# Patient Record
Sex: Female | Born: 1935
Health system: Southern US, Community
[De-identification: ages and names within clinical notes are randomized; demographics above are authoritative.]

## PROBLEM LIST (undated history)

## (undated) ENCOUNTER — Emergency Department (HOSPITAL_COMMUNITY): Admission: EM | Payer: Medicare Other | Source: Home / Self Care

## (undated) DIAGNOSIS — M9711XA Periprosthetic fracture around internal prosthetic right knee joint, initial encounter: Secondary | ICD-10-CM

## (undated) DIAGNOSIS — S82871C Displaced pilon fracture of right tibia, initial encounter for open fracture type IIIA, IIIB, or IIIC: Secondary | ICD-10-CM

## (undated) DIAGNOSIS — Z6841 Body Mass Index (BMI) 40.0 and over, adult: Secondary | ICD-10-CM

## (undated) DIAGNOSIS — S92309A Fracture of unspecified metatarsal bone(s), unspecified foot, initial encounter for closed fracture: Secondary | ICD-10-CM

## (undated) DIAGNOSIS — Z9289 Personal history of other medical treatment: Secondary | ICD-10-CM

## (undated) DIAGNOSIS — Z950 Presence of cardiac pacemaker: Secondary | ICD-10-CM

## (undated) DIAGNOSIS — M1990 Unspecified osteoarthritis, unspecified site: Secondary | ICD-10-CM

## (undated) DIAGNOSIS — Z8719 Personal history of other diseases of the digestive system: Secondary | ICD-10-CM

## (undated) DIAGNOSIS — I471 Supraventricular tachycardia: Secondary | ICD-10-CM

## (undated) DIAGNOSIS — M869 Osteomyelitis, unspecified: Secondary | ICD-10-CM

## (undated) DIAGNOSIS — L02211 Cutaneous abscess of abdominal wall: Secondary | ICD-10-CM

## (undated) DIAGNOSIS — I495 Sick sinus syndrome: Secondary | ICD-10-CM

## (undated) DIAGNOSIS — I10 Essential (primary) hypertension: Secondary | ICD-10-CM

## (undated) DIAGNOSIS — S82202B Unspecified fracture of shaft of left tibia, initial encounter for open fracture type I or II: Secondary | ICD-10-CM

## (undated) DIAGNOSIS — J45909 Unspecified asthma, uncomplicated: Secondary | ICD-10-CM

## (undated) DIAGNOSIS — M9712XA Periprosthetic fracture around internal prosthetic left knee joint, initial encounter: Secondary | ICD-10-CM

## (undated) DIAGNOSIS — R0602 Shortness of breath: Secondary | ICD-10-CM

## (undated) DIAGNOSIS — I2510 Atherosclerotic heart disease of native coronary artery without angina pectoris: Secondary | ICD-10-CM

## (undated) DIAGNOSIS — F0280 Dementia in other diseases classified elsewhere without behavioral disturbance: Secondary | ICD-10-CM

## (undated) DIAGNOSIS — N390 Urinary tract infection, site not specified: Secondary | ICD-10-CM

## (undated) DIAGNOSIS — K5790 Diverticulosis of intestine, part unspecified, without perforation or abscess without bleeding: Secondary | ICD-10-CM

## (undated) DIAGNOSIS — Z45018 Encounter for adjustment and management of other part of cardiac pacemaker: Secondary | ICD-10-CM

## (undated) HISTORY — PX: JOINT REPLACEMENT: SHX530

## (undated) HISTORY — DX: Essential (primary) hypertension: I10

## (undated) HISTORY — PX: INSERT / REPLACE / REMOVE PACEMAKER: SUR710

## (undated) HISTORY — PX: APPENDECTOMY: SHX54

## (undated) HISTORY — DX: Presence of cardiac pacemaker: Z95.0

## (undated) HISTORY — PX: TONSILLECTOMY: SUR1361

## (undated) HISTORY — DX: Sick sinus syndrome: I49.5

## (undated) HISTORY — DX: Dementia in other diseases classified elsewhere, unspecified severity, without behavioral disturbance, psychotic disturbance, mood disturbance, and anxiety: F02.80

## (undated) HISTORY — PX: BREAST LUMPECTOMY: SHX2

## (undated) HISTORY — DX: Diverticulosis of intestine, part unspecified, without perforation or abscess without bleeding: K57.90

## (undated) HISTORY — DX: Supraventricular tachycardia: I47.1

## (undated) HISTORY — DX: Atherosclerotic heart disease of native coronary artery without angina pectoris: I25.10

## (undated) HISTORY — PX: VENTRAL HERNIA REPAIR: SHX424

## (undated) HISTORY — PX: TOTAL ABDOMINAL HYSTERECTOMY: SHX209

## (undated) HISTORY — PX: HERNIA REPAIR: SHX51

## (undated) HISTORY — PX: REPLACEMENT TOTAL KNEE BILATERAL: SUR1225

---
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
## 2002-11-27 HISTORY — PX: CHOLECYSTECTOMY: SHX55

## 2004-06-27 HISTORY — PX: CORONARY ANGIOPLASTY WITH STENT PLACEMENT: SHX49

## 2009-05-27 ENCOUNTER — Inpatient Hospital Stay (HOSPITAL_COMMUNITY): Admission: EM | Admit: 2009-05-27 | Discharge: 2009-06-04 | Payer: Self-pay | Admitting: Emergency Medicine

## 2011-03-05 LAB — DIFFERENTIAL
Lymphocytes Relative: 17 % (ref 12–46)
Lymphs Abs: 1.7 10*3/uL (ref 0.7–4.0)
Monocytes Relative: 5 % (ref 3–12)
Neutro Abs: 7.6 10*3/uL (ref 1.7–7.7)
Neutrophils Relative %: 76 % (ref 43–77)

## 2011-03-05 LAB — CBC
HCT: 37.7 % (ref 36.0–46.0)
HCT: 39.1 % (ref 36.0–46.0)
Hemoglobin: 13.4 g/dL (ref 12.0–15.0)
MCHC: 34 g/dL (ref 30.0–36.0)
MCHC: 34.1 g/dL (ref 30.0–36.0)
MCV: 96.3 fL (ref 78.0–100.0)
Platelets: 239 10*3/uL (ref 150–400)
RDW: 15.1 % (ref 11.5–15.5)
RDW: 15.4 % (ref 11.5–15.5)

## 2011-03-05 LAB — LIPID PANEL
Cholesterol: 138 mg/dL (ref 0–200)
LDL Cholesterol: 73 mg/dL (ref 0–99)
Total CHOL/HDL Ratio: 2.8 RATIO

## 2011-03-05 LAB — BASIC METABOLIC PANEL
BUN: 9 mg/dL (ref 6–23)
CO2: 28 mEq/L (ref 19–32)
Calcium: 8.6 mg/dL (ref 8.4–10.5)
GFR calc non Af Amer: 54 mL/min — ABNORMAL LOW (ref >60)
Glucose, Bld: 123 mg/dL — ABNORMAL HIGH (ref 70–99)

## 2011-03-05 LAB — GLUCOSE, CAPILLARY
Glucose-Capillary: 100 mg/dL — ABNORMAL HIGH (ref 70–99)
Glucose-Capillary: 103 mg/dL — ABNORMAL HIGH (ref 70–99)
Glucose-Capillary: 106 mg/dL — ABNORMAL HIGH (ref 70–99)
Glucose-Capillary: 109 mg/dL — ABNORMAL HIGH (ref 70–99)
Glucose-Capillary: 110 mg/dL — ABNORMAL HIGH (ref 70–99)
Glucose-Capillary: 113 mg/dL — ABNORMAL HIGH (ref 70–99)
Glucose-Capillary: 128 mg/dL — ABNORMAL HIGH (ref 70–99)
Glucose-Capillary: 129 mg/dL — ABNORMAL HIGH (ref 70–99)
Glucose-Capillary: 69 mg/dL — ABNORMAL LOW (ref 70–99)
Glucose-Capillary: 77 mg/dL (ref 70–99)
Glucose-Capillary: 84 mg/dL (ref 70–99)
Glucose-Capillary: 86 mg/dL (ref 70–99)
Glucose-Capillary: 91 mg/dL (ref 70–99)
Glucose-Capillary: 94 mg/dL (ref 70–99)
Glucose-Capillary: 94 mg/dL (ref 70–99)
Glucose-Capillary: 96 mg/dL (ref 70–99)
Glucose-Capillary: 96 mg/dL (ref 70–99)

## 2011-03-05 LAB — URINALYSIS, ROUTINE W REFLEX MICROSCOPIC
Bilirubin Urine: NEGATIVE
Ketones, ur: NEGATIVE mg/dL
Nitrite: NEGATIVE
pH: 8 (ref 5.0–8.0)

## 2011-03-05 LAB — PROTIME-INR
INR: 1 (ref 0.00–1.49)
Prothrombin Time: 12.8 seconds (ref 11.6–15.2)

## 2011-03-05 LAB — HEPATIC FUNCTION PANEL
AST: 27 U/L (ref 0–37)
Bilirubin, Direct: 0.1 mg/dL (ref 0.0–0.3)

## 2011-03-05 LAB — TSH: TSH: 1.974 u[IU]/mL (ref 0.350–4.500)

## 2011-03-05 LAB — COMPREHENSIVE METABOLIC PANEL
BUN: 15 mg/dL (ref 6–23)
Calcium: 9.5 mg/dL (ref 8.4–10.5)
Creatinine, Ser: 1 mg/dL (ref 0.4–1.2)
Glucose, Bld: 122 mg/dL — ABNORMAL HIGH (ref 70–99)
Sodium: 138 mEq/L (ref 135–145)
Total Protein: 7.6 g/dL (ref 6.0–8.3)

## 2011-03-05 LAB — APTT: aPTT: 30 seconds (ref 24–37)

## 2011-03-21 ENCOUNTER — Other Ambulatory Visit: Payer: Self-pay | Admitting: Oncology

## 2011-03-21 ENCOUNTER — Encounter (HOSPITAL_BASED_OUTPATIENT_CLINIC_OR_DEPARTMENT_OTHER): Payer: Medicare Other | Admitting: Oncology

## 2011-03-21 DIAGNOSIS — D0590 Unspecified type of carcinoma in situ of unspecified breast: Secondary | ICD-10-CM

## 2011-03-21 DIAGNOSIS — I10 Essential (primary) hypertension: Secondary | ICD-10-CM

## 2011-03-21 DIAGNOSIS — K219 Gastro-esophageal reflux disease without esophagitis: Secondary | ICD-10-CM

## 2011-03-21 DIAGNOSIS — I2510 Atherosclerotic heart disease of native coronary artery without angina pectoris: Secondary | ICD-10-CM

## 2011-03-21 LAB — BASIC METABOLIC PANEL
CO2: 23 mEq/L (ref 19–32)
Calcium: 9.3 mg/dL (ref 8.4–10.5)
Chloride: 103 mEq/L (ref 96–112)
Glucose, Bld: 91 mg/dL (ref 70–99)
Potassium: 4.1 mEq/L (ref 3.5–5.3)
Sodium: 141 mEq/L (ref 135–145)

## 2011-03-21 LAB — CBC WITH DIFFERENTIAL/PLATELET
BASO%: 0.4 % (ref 0.0–2.0)
Basophils Absolute: 0 10*3/uL (ref 0.0–0.1)
HCT: 37.8 % (ref 34.8–46.6)
HGB: 12.6 g/dL (ref 11.6–15.9)
LYMPH%: 29.8 % (ref 14.0–49.7)
MCH: 32.1 pg (ref 25.1–34.0)
MCHC: 33.3 g/dL (ref 31.5–36.0)
MCV: 96.3 fL (ref 79.5–101.0)
NEUT%: 62.2 % (ref 38.4–76.8)
RDW: 16.1 % — ABNORMAL HIGH (ref 11.2–14.5)

## 2011-03-22 ENCOUNTER — Ambulatory Visit: Payer: Medicare Other | Attending: Radiation Oncology | Admitting: Radiation Oncology

## 2011-03-22 DIAGNOSIS — Z7902 Long term (current) use of antithrombotics/antiplatelets: Secondary | ICD-10-CM | POA: Insufficient documentation

## 2011-03-22 DIAGNOSIS — E785 Hyperlipidemia, unspecified: Secondary | ICD-10-CM | POA: Insufficient documentation

## 2011-03-22 DIAGNOSIS — I10 Essential (primary) hypertension: Secondary | ICD-10-CM | POA: Insufficient documentation

## 2011-03-22 DIAGNOSIS — I4891 Unspecified atrial fibrillation: Secondary | ICD-10-CM | POA: Insufficient documentation

## 2011-03-22 DIAGNOSIS — I2510 Atherosclerotic heart disease of native coronary artery without angina pectoris: Secondary | ICD-10-CM | POA: Insufficient documentation

## 2011-03-22 DIAGNOSIS — Z950 Presence of cardiac pacemaker: Secondary | ICD-10-CM | POA: Insufficient documentation

## 2011-03-22 DIAGNOSIS — D0590 Unspecified type of carcinoma in situ of unspecified breast: Secondary | ICD-10-CM | POA: Insufficient documentation

## 2011-03-22 DIAGNOSIS — Z7982 Long term (current) use of aspirin: Secondary | ICD-10-CM | POA: Insufficient documentation

## 2011-03-22 DIAGNOSIS — Z79899 Other long term (current) drug therapy: Secondary | ICD-10-CM | POA: Insufficient documentation

## 2011-04-11 NOTE — H&P (Signed)
NAME:  Jill Bowman, Jill Bowman NO.:  [id number]   MEDICAL RECORD NO.:  [id number]          PATIENT TYPE:  EMS   LOCATION:  [facility]                         FACILITY:  [facility]   PHYSICIAN:  Eduard Clos, MDDATE OF BIRTH:  Apr 20, 1929   DATE OF ADMISSION:  05/27/2009  DATE OF DISCHARGE:                              HISTORY & PHYSICAL   PRIMARY CARE PHYSICIAN:  Meredith Staggers at [place] [place].   PRIMARY SURGEON:  Dr. Gwynneth Munson in [place].   CHIEF COMPLAINT:  Abdominal pain.   PRESENT ILLNESS:  A 77 year old female with known history of CAD status  post stenting, hypertension, recent bowel rupture secondary to hernia  and in March 2010 had surgery who presented with complaints of abdominal  pain since last night. The patient was doing  fairly well until last  night when she had moved from [place] to live with her  daughter in  April.  She has frequent, constant touch with her primary care doctor's  in [place] and gets her medications refilled.   Last night the patient started developing  some abdominal pain in  midabdomen  severe and she came to the ER.  The patient has been having  some nausea but not had any frank vomiting.  She did not have any  diarrhea.  The patient has had some constipation and had called a doctor  in the ER who had given her  MiraLax despite which she did not have a  good bowel movement.  Denies any diarrhea, fever, chills.  Abdominal  pain is constant relieved by morphine given at the ER.  The pain is  dull, aching, nonradiating, midabdomen.   The patient denies any chest pain, shortness of breath, palpitation,  dizziness, loss of consciousness, weakness of limbs, dysuria or  discharges.   PAST MEDICAL HISTORY:  1. Hypertension.  2. Hyperglycemia.  3. CAD status post stenting.  4. Arrhythmia.  5. Pacemaker placement.   PAST SURGICAL HISTORY:  1. Hernia repair.  2. Bilateral knee replacement.  3. Stent placement.   MEDICATIONS PRIOR TO ADMISSION:  1. Metoprolol 25 mg p.o. t.i.d.  2. Claritin 10 mg p.o. daily.  3. HCTZ 25 mg p.o. daily.  4. Pravastatin 4 mg p.o. daily.  5. MiraLax  p.r.n.  6. Vitamin D 2000 units p.o. daily.  7. B6 50 mg p.o. daily.  8. Tylenol arthritis p.r.n.  9. Aspirin 81 mg p.o. daily.  10.Amiodarone 200 mg p.o. daily.  11.Plavix 75 mg daily.   ALLERGIES:  PENICILLIN, ZOCOR AND CODEINE.   FAMILY HISTORY:  Noncontributory.   SOCIAL HISTORY:  The patient lives with her daughter.  Denies smoking  cigarettes, drinking alcohol or use of illegal drugs.   REVIEW OF SYSTEMS:  At present, as in history of present illness.  Nothing else significant.   PHYSICAL EXAMINATION:  The patient is examined at bedside, not in acute  distress.  VITAL SIGNS:  Blood pressure 105/60, pulse 60 per minute, temperature  97.9, respirations 18 per minute, O2 saturation 100% HEENT:  Anicteric.  No pallor.  CHEST:  Bilateral  air entry  present.  No rhonchi, no crepitation.  HEART:  S1 and S2 heard.  ABDOMEN:  Soft, nontender.  Bowel sounds heard.  There are scars from  previous surgery, none of them having any active discharge.  No guarding  or rigidity.  NEUROLOGIC:  Alert, awake, oriented to time, place and person.  Moves  upper and lower extremities.  EXTREMITIES:  Peripheral pulses felt.  No edema.   Labs:  CBC = WBCs 10, hemoglobin 13.4, hematocrit 39.1, platelets 240,  neutrophils 76%, PT/INR 2.8 and 1.  Complete metabolic panel - sodium  138, potassium 3.9, chloride 100, carbon dioxide 26, glucose 122, BUN  15, creatinine 1, total bilirubin 0.8, AST 24, ALT 13, alkaline phos 89,  albumin 3.9, calcium 9.5.  Lactic acid is 3.5.  UA nitrites and  leukocytes are negative.  CT abdomen and pelvis shows right lower  quadrant abdominal hernia through which small bowel traverses;  this  does not appear to be point of obstruction, has small bowel loops of  fluid-filled and prominent  proximal  and distal to this region.  The  exact cause of  small bowel dilatation is indeterminate.  Mesenteric  adenopathy indeterminate, question infectious versus inflammatory with  malignant.  Degenerative lumbar spine, atherosclerotic changes in aorta  pelvis shows prominent sigmoid diverticulosis without diverticulitis.   ASSESSMENT:  1. Abdominal pain with possible small-bowel obstruction.  2. Recent hernia rupture of the abdomen and surgery.  3. Dehydration.  4. Hypertension.  5. Coronary artery disease.  6. Hyperlipidemia.   PLAN:  1. Will admit the patient to telemetry as the patient has significant      cardiac issues.  Will hydrate the patient with normal saline and      place the patient on clear liquid diet with CBG checks.  2. Place on Cipro and Flagyl.  3. Will place the patient on pain relief medication and morphine as      morphine is giving considerable relief  for the      patient.  4. Will place the patient on resumed  home medications including      Plavix and aspirin.  5. I have requested a surgery consult.  Will follow their      recommendations.      Eduard Clos, MD  Electronically Signed     ANK/MEDQ  D:  05/27/2009  T:  05/27/2009  Job:  [id number]

## 2011-04-11 NOTE — Discharge Summary (Signed)
NAMELINNELL, SWORDS            ACCOUNT NO.:  [id number]   MEDICAL RECORD NO.:  [id number]          PATIENT TYPE:  INP   LOCATION:  5016                         FACILITY:  [facility]   PHYSICIAN:  Michelene Gardener, MD    DATE OF BIRTH:  [birth year]   DATE OF ADMISSION:  05/27/2009  DATE OF DISCHARGE:  06/04/2009                               DISCHARGE SUMMARY   FINAL DISCHARGE SUMMARY:  1. Partial small-bowel obstruction, which totally resolved at the time      of discharge and the patient tolerated diet well.  2. Hypertension.  3. Lower extremities edema.  4. Coronary artery disease.  5. Status post permanent pacemaker placement.  6. Dyslipidemia.  7. Minor left lower extremity cellulitis that totally resolved.   DISCHARGE MEDICATIONS:  1. Lasix 20 mg p.o. once daily for 1 week.  2. Colace 100 mg p.o. twice daily as needed.  The patient advised not      to take it unless she is constipated.  3. Imodium 2 mg after each loose bowel movement, maximum 8 mg per day.      The patient advised not to take it unless she had more than three      bowel movements a day.  4. Metoprolol 25 mg three times daily.  5. Claritin 10 mg once a day.  6. Pravastatin 40 mg once a day.  7. MiraLax as needed.  8. Vitamin D 2000 units once a day.  9. Vitamin B6 50 mg once a day.  10.Aspirin 81 mg once a day.  11.Amiodarone 200 mg once a day.  12.Plavix 75 mg once a day.  13.Ranitidine 300 mg p.o. once a day.   CONSULTATION:  Surgical consult.   PROCEDURES:  None.   DIAGNOSTIC STUDIES:  For details about diagnostic studies, please see  previously dictated progress note in May 31, 2009.   FOLLOW UP:  1. Primary doctor in [place], Dr. Willy Eddy within 2 weeks.  2. Primary surgeon in [place], Dr. Gwynneth Munson.  The patient already      had an appointment in August.  3. If the patient remains at [facility], then she was advised to      contact [facility] and number was given to  the      patient and family.   COURSE OF HOSPITALIZATION:  For details about this course of  hospitalization, please refer to previously dictated progress note done  by Dr. Ruthy Dick on May 31, 2009.  Since that time, the patient  has been doing very well.  No further diagnostic studies were done.  When I saw this patient on June 02, 2009, she was on clear liquid diet  and she was tolerating very well.  Her diet was advanced and she did  very well.  She developed minor redness and tenderness in her left lower  extremity for which she was started on clindamycin and that totally  resolved.  The patient also developed lower extremity edema, which is  apparently from the IV fluid that she has been taking.  This normally  happens from time  to time and the patient is to take  hydrochlorothiazide.  I advised her and her daughter to discontinue the  hydrochlorothiazide for now and to restart Lasix small dose at 20 mg  once a day, to be taken for a week and then to be reevaluated by her  primary doctor to see if she needs more medicine.   Today at the time of discharge, the patient feels very well, does not  have abdominal pain, does not have nausea, does not have vomiting.  Vital signs are stable.  She would be discharged home today in the above-  mentioned medications.   Total assessment time is 40 minutes.      Michelene Gardener, MD  Electronically Signed     NAE/MEDQ  D:  06/04/2009  T:  06/05/2009  Job:  [id number]

## 2011-04-11 NOTE — Group Therapy Note (Signed)
NAMEANGELISSA, Bowman NO.:  [id number]   MEDICAL RECORD NO.:  [id number]          PATIENT TYPE:  INP   LOCATION:  3702                         [facility]   PHYSICIAN:  Ruthy Dick, MD    DATE OF BIRTH:  07/06/1929                                 PROGRESS NOTE   PROBLEM LIST:  1. Partial small-bowel obstruction resolving slowly at this time. The      patient CT enterography today for further evaluation.  Last      abdominal x-ray done yesterday showed a resolving small bowel      obstruction.  The patient today has a small abdominal discomfort      but the nausea has resolved.  She is being managed by Surgery as      well.  2. Hypertension, controlled.  3. Coronary coronary disease status post stenting and stable.  4. Arrhythmia.  5. Permanent pacemaker placement, stable.  6. Itching.  The patient is on of Benadryl.  7. Dyslipidemia.  The patient is on pravastatin.   Consult during this admission :  surgical consult.   PROCEDURES DONE DURING THIS ADMISSION:  1. CT scan of the abdomen and pelvis which was read as right lower      quadrant anterior abdominal wall hernia through which small bowel      transverses. Exact cause of small bowel dilatation is      indeterminate.  2. CT scan of the pelvis showed prominent sigmoid diverticulosis      without CT evidence of diverticulitis.  3. X-ray of the abdomen done on May 27, 2009 showed air fluid levels      and several mildly distended small bowel loops suggesting adynamic      ileus versus early partial small-bowel obstruction.  No free air      was noted.  4. An abdominal x-ray done yesterday, May 30, 2009, showed a resolving      partial small-bowel obstruction.   BRIEF HISTORY OF PRESENT ILLNESS AND HOSPITAL COURSE:  This is 77 year old lady with a known history of CAD status post  stenting, hypertension and recent bowel rupture secondary to hernia in  March 2010 and had surgery at that time.  She  presented with complaints  of abdominal pain since the night prior to presentation. As noted above  x-ray pointed to a possible partial small bowel obstruction and the  patient has been kept n.p.o., managed by surgery in the hope of avoiding  surgical procedure.  At this point she seems to be resolving some and if  she is able to resolve conservatively that would be the best option for  her at this time.  She has been placed prophylactically on Cipro and  Flagyl at this point.   DISPOSITION:  Will be according to the surgical team.  Today the patient says she has  no nausea.  She had three episodes of flatus last night but no bowel  movement yet.  She continues to have some abdominal discomfort but  improved from what she had on presentation. No chest pain, no shortness  of breath and  no nausea and vomiting.  No dysuria, no frequency, no  urgency, no syncope. Vitals today are temperature 98.1, pulse 61,  respiration 18, blood pressure 94/47, saturating 98% on 2 liters nasal  cannula.  CHEST:  Clear to auscultation bilaterally.  ABDOMEN:  Soft  with only very mild tenderness on very deep palpation.  No rebound or  guarding.  Bowel sounds present.  They are hyperactive.  EXTREMITIES:  No clubbing, cyanosis, or edema.  CARDIOVASCULAR:  First and second  heart sounds only. CENTRAL NERVOUS SYSTEM:  Nonfocal.   DISCHARGE MEDICATIONS:  This will be dictated at the time of final discharge.      Ruthy Dick, MD  Electronically Signed     GU/MEDQ  D:  05/31/2009  T:  05/31/2009  Job:  [id number]

## 2011-05-05 ENCOUNTER — Encounter: Payer: Self-pay | Admitting: Internal Medicine

## 2011-05-05 ENCOUNTER — Emergency Department (HOSPITAL_COMMUNITY)
Admission: EM | Admit: 2011-05-05 | Discharge: 2011-05-05 | Disposition: A | Discharge status: Home / Self Care | Payer: Medicare Other | Attending: Emergency Medicine | Admitting: Emergency Medicine

## 2011-05-05 DIAGNOSIS — E7800 Pure hypercholesterolemia, unspecified: Secondary | ICD-10-CM | POA: Insufficient documentation

## 2011-05-05 DIAGNOSIS — J45909 Unspecified asthma, uncomplicated: Secondary | ICD-10-CM | POA: Insufficient documentation

## 2011-05-05 DIAGNOSIS — I10 Essential (primary) hypertension: Secondary | ICD-10-CM | POA: Insufficient documentation

## 2011-05-05 DIAGNOSIS — T82897A Other specified complication of cardiac prosthetic devices, implants and grafts, initial encounter: Secondary | ICD-10-CM | POA: Insufficient documentation

## 2011-05-05 DIAGNOSIS — E119 Type 2 diabetes mellitus without complications: Secondary | ICD-10-CM | POA: Insufficient documentation

## 2011-05-05 DIAGNOSIS — I498 Other specified cardiac arrhythmias: Secondary | ICD-10-CM | POA: Insufficient documentation

## 2011-05-05 DIAGNOSIS — Y831 Surgical operation with implant of artificial internal device as the cause of abnormal reaction of the patient, or of later complication, without mention of misadventure at the time of the procedure: Secondary | ICD-10-CM | POA: Insufficient documentation

## 2011-06-05 ENCOUNTER — Encounter: Payer: Self-pay | Admitting: Internal Medicine

## 2011-06-06 ENCOUNTER — Ambulatory Visit (INDEPENDENT_AMBULATORY_CARE_PROVIDER_SITE_OTHER): Payer: Medicare Other | Admitting: Internal Medicine

## 2011-06-06 ENCOUNTER — Encounter: Payer: Self-pay | Admitting: Internal Medicine

## 2011-06-06 ENCOUNTER — Encounter: Payer: Self-pay | Admitting: *Deleted

## 2011-06-06 DIAGNOSIS — I498 Other specified cardiac arrhythmias: Secondary | ICD-10-CM

## 2011-06-06 DIAGNOSIS — I2510 Atherosclerotic heart disease of native coronary artery without angina pectoris: Secondary | ICD-10-CM

## 2011-06-06 DIAGNOSIS — I495 Sick sinus syndrome: Secondary | ICD-10-CM | POA: Insufficient documentation

## 2011-06-06 DIAGNOSIS — Z950 Presence of cardiac pacemaker: Secondary | ICD-10-CM

## 2011-06-06 LAB — PACEMAKER DEVICE OBSERVATION
BATTERY VOLTAGE: 2.57 V
BMOD-0003RV: 30
BMOD-0005RV: 95 {beats}/min
RV LEAD AMPLITUDE: 8 mv
RV LEAD IMPEDENCE PM: 484 Ohm
RV LEAD THRESHOLD: 1.25 V
VENTRICULAR PACING PM: 8

## 2011-06-06 NOTE — Assessment & Plan Note (Signed)
The patient's device was interrogated.  The information was reviewed. No changes were made in the programming.     The device is reverted to VVI as she is at [facility]. We discussed risks benefits and alternatives of the device generator replacement. She is agreeable to proceed

## 2011-06-06 NOTE — Assessment & Plan Note (Signed)
Stable on curretn meds

## 2011-06-06 NOTE — Assessment & Plan Note (Signed)
The patient's device is programmed in atrium 35% of the time prior to reversion. It was not ventricularly paced at all. Presently she is not having significant symptoms with ventricular pacing. She has not had significant atrial fibrillation and Dr. Ottis Stain has decreased her amiodarone from 200-100 mg a day. It was is considering these have all anticoagulation as oppsed to  aspirin Plavix combination which she is currently taking

## 2011-06-06 NOTE — Patient Instructions (Signed)
Your physician has recommended that you have a pacemaker generator change. Please see the instruction sheet given to you today for more information.    

## 2011-06-06 NOTE — Progress Notes (Signed)
HPI: Jill Bowman is a 77 y.o. female Seen at the request of Dr. Ottis Stain for pacemaker generator replacement.  She has been staying here with her daughter following breast cancer treatment. She underwent pacemaker implantation [place] in 2005 for tachybradycardia syndrome. Her device has reached ERI. It appears that initial voltages were never changed.  His history of atrial fibrillation for which she takes amiodarone. She currently takes aspirin and Plavix but is not on warfarin.  Her history of coronary disease is notable for stenting August 05 in her left coronary.. Echo 2011 was normal and also negative nuclear test in March of 2011.  She has problems with bruisability; she has no chest pain. She has mild exercise intolerance and peripheral edema.   There is some discomfort/itching at her pacemaker site Current Outpatient Prescriptions  Medication Sig Dispense Refill  . acetaminophen (TYLENOL) 325 MG tablet Take 650 mg by mouth every 6 (six) hours as needed.        Marland Kitchen amiodarone (PACERONE) 200 MG tablet Take 100 mg by mouth daily.        Marland Kitchen aspirin 81 MG tablet Take 162 mg by mouth daily.        . Cholecalciferol (VITAMIN D) 1000 UNITS capsule Take 2,000 Units by mouth daily.        . clopidogrel (PLAVIX) 75 MG tablet Take 75 mg by mouth daily.        . hydrochlorothiazide 25 MG tablet Take 25 mg by mouth daily.        Marland Kitchen loratadine (CLARITIN) 10 MG tablet Take 10 mg by mouth daily.        . metoprolol succinate (TOPROL-XL) 25 MG 24 hr tablet Take 25 mg by mouth daily.        . potassium chloride (KLOR-CON) 10 MEQ CR tablet Take 10 mEq by mouth daily.        . pravastatin (PRAVACHOL) 80 MG tablet Take 80 mg by mouth daily.        . ranitidine (ZANTAC) 300 MG capsule Take 300 mg by mouth every evening.          Allergies  Allergen Reactions  . Ciprofloxacin   . Penicillins   . Sulfa Antibiotics     Past Medical History  Diagnosis Date  . Hypertension   . Coronary artery disease    . Diabetes mellitus   . Chest pain   . Diverticulosis   . Asthma   . Hypercholesterolemia   . Arrhythmia     Past Surgical History  Procedure Date  . Insert / replace / remove pacemaker   . Hernia repair     No family history on file. Family history is notable for heart problems and black lung  History   Social History  . Marital Status: Single    Spouse Name: N/A    Number of Children: N/A  . Years of Education: N/A   Occupational History  . Not on file.   Social History Main Topics  . Smoking status: Never Smoker   . Smokeless tobacco: Not on file  . Alcohol Use: No  . Drug Use: No  . Sexually Active: Not on file   Other Topics Concern  . Not on file   Social History Narrative  . No narrative on file  she is widowed she has 5 children she does not use alcohol or recreational drugs  Fourteen point review of systems was negative except as noted in HPI and  pmh except  arthritis and allergies  PHYSICAL EXAMINATION  Blood pressure 157/80, pulse 65, height 5' 7.5" (1.715 m), weight 204 lb (92.534 kg).   Well developed and nourished elderly obese African American female her stated agein no acute distress HENT normal Neck supple with JVP-flat Carotids brisk and full without bruits Back without scoliosis or kyphosis Clear Pacemaker pocket is well-healed with some downward migration of the device Regular rate and rhythm, no murmurs or gallops Abd-soft with active BS without hepatomegaly or midline pulsation Femoral pulses 2+ distal pulses intact No Clubbing cyanosis ;trace edema Skin-warm and dry LN-neg submandibular and supraclavicular A & Oriented CN 3-12 normal  Grossly normal sensory and motor function Affect engaging   ECG demonstrates ventricular pacing at 65 without evidence of atrial activity   .

## 2011-06-19 ENCOUNTER — Other Ambulatory Visit (INDEPENDENT_AMBULATORY_CARE_PROVIDER_SITE_OTHER): Payer: Medicare Other | Admitting: *Deleted

## 2011-06-19 DIAGNOSIS — I2510 Atherosclerotic heart disease of native coronary artery without angina pectoris: Secondary | ICD-10-CM

## 2011-06-19 DIAGNOSIS — I498 Other specified cardiac arrhythmias: Secondary | ICD-10-CM

## 2011-06-19 DIAGNOSIS — I495 Sick sinus syndrome: Secondary | ICD-10-CM

## 2011-06-19 DIAGNOSIS — Z950 Presence of cardiac pacemaker: Secondary | ICD-10-CM

## 2011-06-19 LAB — CBC WITH DIFFERENTIAL/PLATELET
Basophils Relative: 0.4 % (ref 0.0–3.0)
Eosinophils Relative: 2 % (ref 0.0–5.0)
HCT: 36.2 % (ref 36.0–46.0)
Lymphs Abs: 1.7 10*3/uL (ref 0.7–4.0)
Monocytes Relative: 5.8 % (ref 3.0–12.0)
Platelets: 294 10*3/uL (ref 150.0–400.0)
RBC: 3.77 Mil/uL — ABNORMAL LOW (ref 3.87–5.11)
WBC: 7.4 10*3/uL (ref 4.5–10.5)

## 2011-06-19 LAB — BASIC METABOLIC PANEL
BUN: 26 mg/dL — ABNORMAL HIGH (ref 6–23)
Chloride: 101 mEq/L (ref 96–112)
GFR: 47.3 mL/min — ABNORMAL LOW (ref >60.00)
Potassium: 4.8 mEq/L (ref 3.5–5.1)
Sodium: 136 mEq/L (ref 135–145)

## 2011-06-19 LAB — PROTIME-INR
INR: 1 ratio (ref 0.8–1.0)
Prothrombin Time: 11.1 s (ref 10.2–12.4)

## 2011-06-23 ENCOUNTER — Ambulatory Visit (HOSPITAL_COMMUNITY)
Admission: RE | Admit: 2011-06-23 | Discharge: 2011-06-23 | Disposition: A | Discharge status: Home / Self Care | Payer: Medicare Other | Source: Ambulatory Visit | Attending: Internal Medicine | Admitting: Internal Medicine

## 2011-06-23 DIAGNOSIS — Z7982 Long term (current) use of aspirin: Secondary | ICD-10-CM | POA: Insufficient documentation

## 2011-06-23 DIAGNOSIS — Z45018 Encounter for adjustment and management of other part of cardiac pacemaker: Secondary | ICD-10-CM | POA: Insufficient documentation

## 2011-06-23 DIAGNOSIS — Z79899 Other long term (current) drug therapy: Secondary | ICD-10-CM | POA: Insufficient documentation

## 2011-06-23 DIAGNOSIS — I498 Other specified cardiac arrhythmias: Secondary | ICD-10-CM

## 2011-06-23 DIAGNOSIS — E119 Type 2 diabetes mellitus without complications: Secondary | ICD-10-CM | POA: Insufficient documentation

## 2011-06-23 DIAGNOSIS — I10 Essential (primary) hypertension: Secondary | ICD-10-CM | POA: Insufficient documentation

## 2011-06-23 DIAGNOSIS — I2510 Atherosclerotic heart disease of native coronary artery without angina pectoris: Secondary | ICD-10-CM | POA: Insufficient documentation

## 2011-06-23 DIAGNOSIS — J45909 Unspecified asthma, uncomplicated: Secondary | ICD-10-CM | POA: Insufficient documentation

## 2011-06-23 DIAGNOSIS — Z853 Personal history of malignant neoplasm of breast: Secondary | ICD-10-CM | POA: Insufficient documentation

## 2011-06-23 DIAGNOSIS — E7800 Pure hypercholesterolemia, unspecified: Secondary | ICD-10-CM | POA: Insufficient documentation

## 2011-06-23 DIAGNOSIS — K5730 Diverticulosis of large intestine without perforation or abscess without bleeding: Secondary | ICD-10-CM | POA: Insufficient documentation

## 2011-06-23 LAB — SURGICAL PCR SCREEN: Staphylococcus aureus: POSITIVE — AB

## 2011-07-06 ENCOUNTER — Ambulatory Visit (INDEPENDENT_AMBULATORY_CARE_PROVIDER_SITE_OTHER): Payer: Medicare Other | Admitting: *Deleted

## 2011-07-06 DIAGNOSIS — I495 Sick sinus syndrome: Secondary | ICD-10-CM

## 2011-07-24 NOTE — Op Note (Signed)
  NAMEMarland Kitchen  Jill Bowman, Jill Bowman NO.:  [id number]  MEDICAL RECORD NO.:  [id number]  LOCATION:  [facility]                         FACILITY:  [facility]  PHYSICIAN:  Duke Salvia, MD, FACCDATE OF BIRTH:  [birth year]  DATE OF PROCEDURE:  06/23/2011 DATE OF DISCHARGE:  06/23/2011                              OPERATIVE REPORT   PREOPERATIVE DIAGNOSES:  Sinus node dysfunction, previously implanted pacemaker now at elective replacement indicator.  POSTOPERATIVE DIAGNOSES:  Sinus node dysfunction, previously implanted pacemaker now at elective replacement indicator.  PROCEDURE:  Explantation of a previously implanted device, pocket revision, and insertion of a new device.  Following obtaining informed consent, the patient was brought to the Electrophysiology Laboratory and placed on the fluoroscopic table in supine position.  After routine prep and drape of the left upper chest, lidocaine was infiltrated about 0.5 inch caudal to the previous incision as the pacemaker had "fallen down" about 2-3 inches.  Incision was made and carried down to layer of the prepectoral fascia using sharp dissection and then the pocket was dissected caudally until the pacemaker was exposed.  The pacemaker was freed up and explanted from the previously explanted and removed from the previous leads.  The leads were then dissected over about 8 inches of their length because they were round and around each other and formed hairpin turns on themselves. The pocket was then copiously irrigated with antibiotic-containing saline solution.  The leads and pulse generator were attached to a Medtronic Adapta R01 pulse generator, serial number [id number] H ventricular pacing and atrial pacing were identified.  The leads and pulse generator were placed in the pocket and secured to the prepectoral fascia.  Surgicel was placed on the posterior aspect of the pocket where the prepectoral fascia had been removed and the muscle  exposed and the wound was then closed in 3 layers in a normal fashion.  The wound was washed and dried and a benzoin and Steri-Strip dressing was applied. Needle counts, sponge counts, and instrument counts were correct at the end of the procedure according to the staff.  The patient tolerated the procedure without apparent complication.     Duke Salvia, MD, [facility]     SCK/MEDQ  D:  06/23/2011  T:  06/24/2011  Job:  [id number]  Electronically Signed by Sherryl Manges MD [facility] on 07/24/2011 01:50:47 PM

## 2011-10-02 ENCOUNTER — Encounter: Payer: PRIVATE HEALTH INSURANCE | Admitting: Internal Medicine

## 2012-02-12 ENCOUNTER — Encounter: Payer: Self-pay | Admitting: Internal Medicine

## 2012-07-03 ENCOUNTER — Inpatient Hospital Stay (HOSPITAL_COMMUNITY): Payer: Medicare Other

## 2012-07-03 ENCOUNTER — Emergency Department (HOSPITAL_COMMUNITY): Payer: Medicare Other

## 2012-07-03 ENCOUNTER — Encounter (HOSPITAL_COMMUNITY): Admission: EM | Disposition: A | Discharge status: Skilled Nursing Facility | Payer: Self-pay | Source: Home / Self Care

## 2012-07-03 ENCOUNTER — Encounter (HOSPITAL_COMMUNITY): Payer: Self-pay | Admitting: Certified Registered Nurse Anesthetist

## 2012-07-03 ENCOUNTER — Encounter (HOSPITAL_COMMUNITY): Payer: Self-pay | Admitting: General Surgery

## 2012-07-03 ENCOUNTER — Inpatient Hospital Stay (HOSPITAL_COMMUNITY)
Admission: EM | Admit: 2012-07-03 | Discharge: 2012-07-18 | DRG: 956 | Disposition: A | Discharge status: Skilled Nursing Facility | Payer: Medicare Other | Attending: General Surgery | Admitting: General Surgery

## 2012-07-03 ENCOUNTER — Inpatient Hospital Stay (HOSPITAL_COMMUNITY): Payer: Medicare Other | Admitting: Certified Registered Nurse Anesthetist

## 2012-07-03 DIAGNOSIS — S81809A Unspecified open wound, unspecified lower leg, initial encounter: Secondary | ICD-10-CM | POA: Diagnosis present

## 2012-07-03 DIAGNOSIS — M5380 Other specified dorsopathies, site unspecified: Secondary | ICD-10-CM | POA: Diagnosis not present

## 2012-07-03 DIAGNOSIS — D696 Thrombocytopenia, unspecified: Secondary | ICD-10-CM | POA: Diagnosis present

## 2012-07-03 DIAGNOSIS — S82871C Displaced pilon fracture of right tibia, initial encounter for open fracture type IIIA, IIIB, or IIIC: Secondary | ICD-10-CM | POA: Diagnosis present

## 2012-07-03 DIAGNOSIS — J9601 Acute respiratory failure with hypoxia: Secondary | ICD-10-CM | POA: Diagnosis present

## 2012-07-03 DIAGNOSIS — E46 Unspecified protein-calorie malnutrition: Secondary | ICD-10-CM | POA: Diagnosis present

## 2012-07-03 DIAGNOSIS — R0902 Hypoxemia: Secondary | ICD-10-CM | POA: Diagnosis present

## 2012-07-03 DIAGNOSIS — E876 Hypokalemia: Secondary | ICD-10-CM | POA: Diagnosis not present

## 2012-07-03 DIAGNOSIS — S81009A Unspecified open wound, unspecified knee, initial encounter: Secondary | ICD-10-CM | POA: Diagnosis present

## 2012-07-03 DIAGNOSIS — I2510 Atherosclerotic heart disease of native coronary artery without angina pectoris: Secondary | ICD-10-CM | POA: Diagnosis present

## 2012-07-03 DIAGNOSIS — S92309A Fracture of unspecified metatarsal bone(s), unspecified foot, initial encounter for closed fracture: Secondary | ICD-10-CM | POA: Diagnosis present

## 2012-07-03 DIAGNOSIS — Z79899 Other long term (current) drug therapy: Secondary | ICD-10-CM

## 2012-07-03 DIAGNOSIS — Z950 Presence of cardiac pacemaker: Secondary | ICD-10-CM

## 2012-07-03 DIAGNOSIS — S82209B Unspecified fracture of shaft of unspecified tibia, initial encounter for open fracture type I or II: Principal | ICD-10-CM | POA: Diagnosis present

## 2012-07-03 DIAGNOSIS — I959 Hypotension, unspecified: Secondary | ICD-10-CM | POA: Diagnosis present

## 2012-07-03 DIAGNOSIS — S82202B Unspecified fracture of shaft of left tibia, initial encounter for open fracture type I or II: Secondary | ICD-10-CM | POA: Diagnosis present

## 2012-07-03 DIAGNOSIS — M9711XA Periprosthetic fracture around internal prosthetic right knee joint, initial encounter: Secondary | ICD-10-CM

## 2012-07-03 DIAGNOSIS — Y92410 Unspecified street and highway as the place of occurrence of the external cause: Secondary | ICD-10-CM

## 2012-07-03 DIAGNOSIS — Z9289 Personal history of other medical treatment: Secondary | ICD-10-CM

## 2012-07-03 DIAGNOSIS — Y831 Surgical operation with implant of artificial internal device as the cause of abnormal reaction of the patient, or of later complication, without mention of misadventure at the time of the procedure: Secondary | ICD-10-CM | POA: Diagnosis present

## 2012-07-03 DIAGNOSIS — S7292XA Unspecified fracture of left femur, initial encounter for closed fracture: Secondary | ICD-10-CM

## 2012-07-03 DIAGNOSIS — S82201B Unspecified fracture of shaft of right tibia, initial encounter for open fracture type I or II: Secondary | ICD-10-CM

## 2012-07-03 DIAGNOSIS — S7291XA Unspecified fracture of right femur, initial encounter for closed fracture: Secondary | ICD-10-CM

## 2012-07-03 DIAGNOSIS — S36116A Major laceration of liver, initial encounter: Secondary | ICD-10-CM | POA: Diagnosis present

## 2012-07-03 DIAGNOSIS — M9712XA Periprosthetic fracture around internal prosthetic left knee joint, initial encounter: Secondary | ICD-10-CM

## 2012-07-03 DIAGNOSIS — S82409B Unspecified fracture of shaft of unspecified fibula, initial encounter for open fracture type I or II: Secondary | ICD-10-CM

## 2012-07-03 DIAGNOSIS — E872 Acidosis, unspecified: Secondary | ICD-10-CM | POA: Diagnosis present

## 2012-07-03 DIAGNOSIS — S2249XA Multiple fractures of ribs, unspecified side, initial encounter for closed fracture: Secondary | ICD-10-CM

## 2012-07-03 DIAGNOSIS — I10 Essential (primary) hypertension: Secondary | ICD-10-CM | POA: Diagnosis present

## 2012-07-03 DIAGNOSIS — J9600 Acute respiratory failure, unspecified whether with hypoxia or hypercapnia: Secondary | ICD-10-CM | POA: Diagnosis present

## 2012-07-03 DIAGNOSIS — S82899B Other fracture of unspecified lower leg, initial encounter for open fracture type I or II: Secondary | ICD-10-CM | POA: Diagnosis present

## 2012-07-03 DIAGNOSIS — S20219A Contusion of unspecified front wall of thorax, initial encounter: Secondary | ICD-10-CM | POA: Diagnosis present

## 2012-07-03 DIAGNOSIS — T84049A Periprosthetic fracture around unspecified internal prosthetic joint, initial encounter: Secondary | ICD-10-CM | POA: Diagnosis present

## 2012-07-03 DIAGNOSIS — J9819 Other pulmonary collapse: Secondary | ICD-10-CM | POA: Diagnosis present

## 2012-07-03 DIAGNOSIS — S301XXA Contusion of abdominal wall, initial encounter: Secondary | ICD-10-CM | POA: Diagnosis present

## 2012-07-03 DIAGNOSIS — E119 Type 2 diabetes mellitus without complications: Secondary | ICD-10-CM | POA: Diagnosis present

## 2012-07-03 DIAGNOSIS — T07XXXA Unspecified multiple injuries, initial encounter: Secondary | ICD-10-CM

## 2012-07-03 DIAGNOSIS — D62 Acute posthemorrhagic anemia: Secondary | ICD-10-CM | POA: Diagnosis present

## 2012-07-03 DIAGNOSIS — S0003XA Contusion of scalp, initial encounter: Secondary | ICD-10-CM | POA: Diagnosis present

## 2012-07-03 DIAGNOSIS — R0789 Other chest pain: Secondary | ICD-10-CM | POA: Diagnosis present

## 2012-07-03 DIAGNOSIS — Z96659 Presence of unspecified artificial knee joint: Secondary | ICD-10-CM

## 2012-07-03 DIAGNOSIS — S60229A Contusion of unspecified hand, initial encounter: Secondary | ICD-10-CM | POA: Diagnosis present

## 2012-07-03 DIAGNOSIS — J9690 Respiratory failure, unspecified, unspecified whether with hypoxia or hypercapnia: Secondary | ICD-10-CM

## 2012-07-03 DIAGNOSIS — Y998 Other external cause status: Secondary | ICD-10-CM

## 2012-07-03 DIAGNOSIS — Z6841 Body Mass Index (BMI) 40.0 and over, adult: Secondary | ICD-10-CM

## 2012-07-03 HISTORY — DX: Shortness of breath: R06.02

## 2012-07-03 HISTORY — DX: Unspecified asthma, uncomplicated: J45.909

## 2012-07-03 HISTORY — DX: Unspecified osteoarthritis, unspecified site: M19.90

## 2012-07-03 HISTORY — DX: Periprosthetic fracture around internal prosthetic right knee joint, initial encounter: M97.11XA

## 2012-07-03 HISTORY — DX: Personal history of other medical treatment: Z92.89

## 2012-07-03 HISTORY — DX: Unspecified fracture of shaft of left tibia, initial encounter for open fracture type I or II: S82.202B

## 2012-07-03 HISTORY — DX: Fracture of unspecified metatarsal bone(s), unspecified foot, initial encounter for closed fracture: S92.309A

## 2012-07-03 HISTORY — DX: Body Mass Index (BMI) 40.0 and over, adult: Z684

## 2012-07-03 HISTORY — PX: EXTERNAL FIXATION LEG: SHX1549

## 2012-07-03 HISTORY — DX: Morbid (severe) obesity due to excess calories: E66.01

## 2012-07-03 HISTORY — DX: Periprosthetic fracture around internal prosthetic left knee joint, initial encounter: M97.12XA

## 2012-07-03 HISTORY — DX: Displaced pilon fracture of right tibia, initial encounter for open fracture type IIIA, IIIB, or IIIC: S82.871C

## 2012-07-03 HISTORY — PX: I&D EXTREMITY: SHX5045

## 2012-07-03 HISTORY — DX: Personal history of other diseases of the digestive system: Z87.19

## 2012-07-03 LAB — POCT I-STAT 7, (LYTES, BLD GAS, ICA,H+H)
Acid-base deficit: 3 mmol/L — ABNORMAL HIGH (ref 0.0–2.0)
Bicarbonate: 21.8 mEq/L (ref 20.0–24.0)
Bicarbonate: 21.9 mEq/L (ref 20.0–24.0)
Calcium, Ion: 1.13 mmol/L (ref 1.13–1.30)
HCT: 20 % — ABNORMAL LOW (ref 36.0–46.0)
Hemoglobin: 6.8 g/dL — CL (ref 12.0–15.0)
O2 Saturation: 100 %
O2 Saturation: 100 %
Patient temperature: 35.2
Patient temperature: 35.1
Potassium: 3.6 mEq/L (ref 3.5–5.1)
Sodium: 141 mEq/L (ref 135–145)
TCO2: 23 mmol/L (ref 0–100)
TCO2: 23 mmol/L (ref 0–100)
pCO2 arterial: 32.7 mmHg — ABNORMAL LOW (ref 35.0–45.0)
pCO2 arterial: 36 mmHg (ref 35.0–45.0)
pCO2 arterial: 33.9 mmHg — ABNORMAL LOW (ref 35.0–45.0)
pH, Arterial: 7.445 (ref 7.350–7.450)
pO2, Arterial: 474 mmHg — ABNORMAL HIGH (ref 80.0–100.0)
pO2, Arterial: 436 mmHg — ABNORMAL HIGH (ref 80.0–100.0)

## 2012-07-03 LAB — CBC
HCT: 29.4 % — ABNORMAL LOW (ref 36.0–46.0)
Hemoglobin: 9.2 g/dL — ABNORMAL LOW (ref 12.0–15.0)
MCH: 31.7 pg (ref 26.0–34.0)
MCH: 29.4 pg (ref 26.0–34.0)
MCHC: 35.1 g/dL (ref 30.0–36.0)
MCV: 97 fL (ref 78.0–100.0)
Platelets: 274 10*3/uL (ref 150–400)
RBC: 3.03 MIL/uL — ABNORMAL LOW (ref 3.87–5.11)

## 2012-07-03 LAB — COMPREHENSIVE METABOLIC PANEL
AST: 273 U/L — ABNORMAL HIGH (ref 0–37)
Albumin: 2.7 g/dL — ABNORMAL LOW (ref 3.5–5.2)
Chloride: 106 mEq/L (ref 96–112)
Creatinine, Ser: 1.33 mg/dL — ABNORMAL HIGH (ref 0.50–1.10)
Potassium: 3.9 mEq/L (ref 3.5–5.1)
Total Bilirubin: 0.2 mg/dL — ABNORMAL LOW (ref 0.3–1.2)
Total Protein: 5.8 g/dL — ABNORMAL LOW (ref 6.0–8.3)

## 2012-07-03 LAB — POCT I-STAT 3, ART BLOOD GAS (G3+)
Bicarbonate: 24.8 mEq/L — ABNORMAL HIGH (ref 20.0–24.0)
TCO2: 26 mmol/L (ref 0–100)
pCO2 arterial: 35.8 mmHg (ref 35.0–45.0)
pH, Arterial: 7.441 (ref 7.350–7.450)

## 2012-07-03 LAB — CDS SEROLOGY

## 2012-07-03 LAB — POCT I-STAT, CHEM 8
Calcium, Ion: 0.66 mmol/L — ABNORMAL LOW (ref 1.13–1.30)
Chloride: 105 mEq/L (ref 96–112)
HCT: 26 % — ABNORMAL LOW (ref 36.0–46.0)
Hemoglobin: 8.8 g/dL — ABNORMAL LOW (ref 12.0–15.0)

## 2012-07-03 LAB — PROTIME-INR: Prothrombin Time: 19.1 seconds — ABNORMAL HIGH (ref 11.6–15.2)

## 2012-07-03 LAB — LACTIC ACID, PLASMA: Lactic Acid, Venous: 6.7 mmol/L — ABNORMAL HIGH (ref 0.5–2.2)

## 2012-07-03 SURGERY — IRRIGATION AND DEBRIDEMENT EXTREMITY
Anesthesia: General | Laterality: Bilateral

## 2012-07-03 MED ORDER — FENTANYL CITRATE 0.05 MG/ML IJ SOLN
50.0000 ug | Freq: Once | INTRAMUSCULAR | Status: AC
  Administered 2012-07-03: 50 ug via INTRAVENOUS
  Filled 2012-07-03: qty 2

## 2012-07-03 MED ORDER — ONDANSETRON HCL 4 MG/2ML IJ SOLN: 4.0000 mg | Freq: Once | INTRAMUSCULAR | Status: AC | PRN

## 2012-07-03 MED ORDER — ENOXAPARIN SODIUM 30 MG/0.3ML ~~LOC~~ SOLN: 30.0000 mg | SUBCUTANEOUS | Status: DC

## 2012-07-03 MED ORDER — EPHEDRINE SULFATE 50 MG/ML IJ SOLN
INTRAMUSCULAR | Status: DC | PRN
  Administered 2012-07-03: 10 mg via INTRAVENOUS

## 2012-07-03 MED ORDER — SODIUM CHLORIDE 0.9 % IV SOLN
50.0000 ug/h | INTRAVENOUS | Status: DC
  Administered 2012-07-03: 50 ug/h via INTRAVENOUS
  Filled 2012-07-03 (×4): qty 50

## 2012-07-03 MED ORDER — CEFAZOLIN SODIUM-DEXTROSE 2-3 GM-% IV SOLR
2.0000 g | Freq: Three times a day (TID) | INTRAVENOUS | Status: DC
  Administered 2012-07-03 – 2012-07-10 (×20): 2 g via INTRAVENOUS
  Filled 2012-07-03 (×22): qty 50

## 2012-07-03 MED ORDER — ONDANSETRON HCL 4 MG/2ML IJ SOLN
INTRAMUSCULAR | Status: AC
  Filled 2012-07-03: qty 2

## 2012-07-03 MED ORDER — PANTOPRAZOLE SODIUM 40 MG IV SOLR
40.0000 mg | Freq: Every day | INTRAVENOUS | Status: DC
  Administered 2012-07-04 – 2012-07-09 (×5): 40 mg via INTRAVENOUS
  Filled 2012-07-03 (×11): qty 40

## 2012-07-03 MED ORDER — ENOXAPARIN SODIUM 40 MG/0.4ML ~~LOC~~ SOLN: 40.0000 mg | SUBCUTANEOUS | Status: DC

## 2012-07-03 MED ORDER — TETANUS-DIPHTH-ACELL PERTUSSIS 5-2.5-18.5 LF-MCG/0.5 IM SUSP
0.5000 mL | Freq: Once | INTRAMUSCULAR | Status: DC
  Filled 2012-07-03: qty 0.5

## 2012-07-03 MED ORDER — HYDROMORPHONE HCL PF 1 MG/ML IJ SOLN: 0.2500 mg | INTRAMUSCULAR | Status: DC | PRN

## 2012-07-03 MED ORDER — FENTANYL CITRATE 0.05 MG/ML IJ SOLN
INTRAMUSCULAR | Status: AC | PRN
  Administered 2012-07-03: 50 ug via INTRAVENOUS
  Administered 2012-07-03: 18:00:00 via INTRAVENOUS

## 2012-07-03 MED ORDER — ALBUMIN HUMAN 5 % IV SOLN
INTRAVENOUS | Status: DC | PRN
  Administered 2012-07-03 (×2): via INTRAVENOUS

## 2012-07-03 MED ORDER — IOHEXOL 300 MG/ML  SOLN
100.0000 mL | Freq: Once | INTRAMUSCULAR | Status: AC | PRN
  Administered 2012-07-03: 100 mL via INTRAVENOUS

## 2012-07-03 MED ORDER — BIOTENE DRY MOUTH MT LIQD
15.0000 mL | Freq: Four times a day (QID) | OROMUCOSAL | Status: DC
  Administered 2012-07-04 – 2012-07-09 (×20): 15 mL via OROMUCOSAL

## 2012-07-03 MED ORDER — DEXTROSE 5 % IV SOLN
1000.0000 mg | INTRAVENOUS | Status: AC | PRN
  Administered 2012-07-03: 1000 mg via INTRAVENOUS

## 2012-07-03 MED ORDER — VANCOMYCIN HCL 1000 MG IV SOLR
1000.0000 mg | INTRAVENOUS | Status: AC
  Administered 2012-07-03: 1000 mg
  Filled 2012-07-03: qty 1000

## 2012-07-03 MED ORDER — GENTAMICIN IN SALINE 1-0.9 MG/ML-% IV SOLN
100.0000 mg | Freq: Once | INTRAVENOUS | Status: DC
  Filled 2012-07-03: qty 100

## 2012-07-03 MED ORDER — FENTANYL CITRATE 0.05 MG/ML IJ SOLN
50.0000 ug | Freq: Once | INTRAMUSCULAR | Status: AC
  Administered 2012-07-03: 50 ug via INTRAVENOUS

## 2012-07-03 MED ORDER — PANTOPRAZOLE SODIUM 40 MG PO TBEC
40.0000 mg | DELAYED_RELEASE_TABLET | Freq: Every day | ORAL | Status: DC
  Administered 2012-07-10 – 2012-07-18 (×9): 40 mg via ORAL
  Filled 2012-07-03 (×8): qty 1

## 2012-07-03 MED ORDER — TOBRAMYCIN SULFATE 1.2 G IJ SOLR
1.2000 g | INTRAMUSCULAR | Status: AC
  Administered 2012-07-03: 1.2 g via TOPICAL
  Filled 2012-07-03: qty 1.2

## 2012-07-03 MED ORDER — CEFAZOLIN SODIUM 1-5 GM-% IV SOLN
1.0000 g | INTRAVENOUS | Status: AC
  Administered 2012-07-03: 1 g via INTRAVENOUS
  Filled 2012-07-03: qty 50

## 2012-07-03 MED ORDER — SODIUM CHLORIDE 0.9 % IV SOLN
10.0000 mg | INTRAVENOUS | Status: DC | PRN
  Administered 2012-07-03: 25 ug/min via INTRAVENOUS

## 2012-07-03 MED ORDER — MIDAZOLAM HCL 5 MG/5ML IJ SOLN
INTRAMUSCULAR | Status: DC | PRN
  Administered 2012-07-03: 2 mg via INTRAVENOUS

## 2012-07-03 MED ORDER — MIDAZOLAM BOLUS VIA INFUSION
1.0000 mg | INTRAVENOUS | Status: DC | PRN
  Administered 2012-07-03: 2 mg via INTRAVENOUS
  Filled 2012-07-03: qty 2

## 2012-07-03 MED ORDER — FENTANYL CITRATE 0.05 MG/ML IJ SOLN
INTRAMUSCULAR | Status: DC | PRN
  Administered 2012-07-03 (×2): 50 ug via INTRAVENOUS
  Administered 2012-07-03 (×2): 100 ug via INTRAVENOUS
  Administered 2012-07-03 (×2): 50 ug via INTRAVENOUS

## 2012-07-03 MED ORDER — SODIUM CHLORIDE 0.9 % IV SOLN
2.0000 mg/h | INTRAVENOUS | Status: DC
  Administered 2012-07-03: 2 mg/h via INTRAVENOUS
  Administered 2012-07-04 – 2012-07-05 (×3): 4 mg/h via INTRAVENOUS
  Filled 2012-07-03 (×8): qty 10

## 2012-07-03 MED ORDER — SODIUM CHLORIDE 0.9 % IV SOLN
INTRAVENOUS | Status: DC | PRN
  Administered 2012-07-03 (×2): via INTRAVENOUS

## 2012-07-03 MED ORDER — FENTANYL BOLUS VIA INFUSION
50.0000 ug | Freq: Four times a day (QID) | INTRAVENOUS | Status: DC | PRN
  Filled 2012-07-03 (×2): qty 100

## 2012-07-03 MED ORDER — ONDANSETRON HCL 4 MG PO TABS
4.0000 mg | ORAL_TABLET | Freq: Four times a day (QID) | ORAL | Status: DC | PRN
  Filled 2012-07-03: qty 1

## 2012-07-03 MED ORDER — CHLORHEXIDINE GLUCONATE 0.12 % MT SOLN
15.0000 mL | Freq: Two times a day (BID) | OROMUCOSAL | Status: DC
  Administered 2012-07-04 – 2012-07-09 (×12): 15 mL via OROMUCOSAL
  Filled 2012-07-03 (×12): qty 15

## 2012-07-03 MED ORDER — POTASSIUM CHLORIDE IN NACL 20-0.9 MEQ/L-% IV SOLN
INTRAVENOUS | Status: DC
  Administered 2012-07-03: 150 mL/h via INTRAVENOUS
  Filled 2012-07-03 (×7): qty 1000

## 2012-07-03 MED ORDER — CEFAZOLIN SODIUM-DEXTROSE 2-3 GM-% IV SOLR
2.0000 g | INTRAVENOUS | Status: AC
  Administered 2012-07-03: 2 g via INTRAVENOUS
  Filled 2012-07-03: qty 50

## 2012-07-03 MED ORDER — CALCIUM CHLORIDE 10 % IV SOLN
INTRAVENOUS | Status: DC | PRN
  Administered 2012-07-03 (×6): 250 mg via INTRAVENOUS

## 2012-07-03 MED ORDER — ROCURONIUM BROMIDE 100 MG/10ML IV SOLN
INTRAVENOUS | Status: DC | PRN
  Administered 2012-07-03: 50 mg via INTRAVENOUS
  Administered 2012-07-03: 20 mg via INTRAVENOUS
  Administered 2012-07-03: 30 mg via INTRAVENOUS

## 2012-07-03 MED ORDER — ONDANSETRON HCL 4 MG/2ML IJ SOLN: 4.0000 mg | Freq: Four times a day (QID) | INTRAMUSCULAR | Status: DC | PRN

## 2012-07-03 MED ORDER — LACTATED RINGERS IV SOLN
INTRAVENOUS | Status: DC | PRN
  Administered 2012-07-03 (×2): via INTRAVENOUS

## 2012-07-03 SURGICAL SUPPLY — 85 items
100X11BAR ×2 IMPLANT
11MM BAR ×6 IMPLANT
150X11BAR ×2 IMPLANT
300 ANGLES POST, 11MM ×4 IMPLANT
BANDAGE ELASTIC 4 VELCRO ST LF (GAUZE/BANDAGES/DRESSINGS) ×2 IMPLANT
BANDAGE ELASTIC 6 VELCRO ST LF (GAUZE/BANDAGES/DRESSINGS) ×2 IMPLANT
BANDAGE ESMARK 6X9 LF (GAUZE/BANDAGES/DRESSINGS) ×1 IMPLANT
BANDAGE GAUZE ELAST BULKY 4 IN (GAUZE/BANDAGES/DRESSINGS) ×2 IMPLANT
BAR GLASS FIBER EXFX 11X250 (MISCELLANEOUS) ×4 IMPLANT
BAR GLASS FIBER EXFX 11X500 (MISCELLANEOUS) ×8 IMPLANT
BLADE SURG 10 STRL SS (BLADE) ×2 IMPLANT
BLUE 45MM PIN CLAMP ×2 IMPLANT
BNDG COHESIVE 4X5 TAN STRL (GAUZE/BANDAGES/DRESSINGS) ×2 IMPLANT
BNDG COHESIVE 6X5 TAN STRL LF (GAUZE/BANDAGES/DRESSINGS) ×2 IMPLANT
BNDG ESMARK 6X9 LF (GAUZE/BANDAGES/DRESSINGS) ×2
BNDG GAUZE STRTCH 6 (GAUZE/BANDAGES/DRESSINGS) ×6 IMPLANT
BONE CEMENT PALACOSE (Orthopedic Implant) ×2 IMPLANT
BRUSH SCRUB DISP (MISCELLANEOUS) ×4 IMPLANT
CEMENT BONE PALACOSE (Orthopedic Implant) ×1 IMPLANT
CLAMP BLUE BAR TO BAR (MISCELLANEOUS) ×20 IMPLANT
CLAMP BLUE BAR TO PIN (MISCELLANEOUS) ×4 IMPLANT
CLEANER TIP ELECTROSURG 2X2 (MISCELLANEOUS) ×2 IMPLANT
CLOTH BEACON ORANGE TIMEOUT ST (SAFETY) ×2 IMPLANT
COVER SURGICAL LIGHT HANDLE (MISCELLANEOUS) ×4 IMPLANT
CUFF TOURNIQUET SINGLE 18IN (TOURNIQUET CUFF) IMPLANT
CUFF TOURNIQUET SINGLE 24IN (TOURNIQUET CUFF) IMPLANT
CUFF TOURNIQUET SINGLE 34IN LL (TOURNIQUET CUFF) IMPLANT
DRAPE C-ARM 42X72 X-RAY (DRAPES) IMPLANT
DRAPE C-ARMOR (DRAPES) ×2 IMPLANT
DRAPE U-SHAPE 47X51 STRL (DRAPES) ×2 IMPLANT
DRSG ADAPTIC 3X8 NADH LF (GAUZE/BANDAGES/DRESSINGS) ×2 IMPLANT
DRSG MEPILEX BORDER 4X4 (GAUZE/BANDAGES/DRESSINGS) ×2 IMPLANT
DRSG MEPITEL 4X7.2 (GAUZE/BANDAGES/DRESSINGS) ×2 IMPLANT
DRSG VAC ATS LRG SENSATRAC (GAUZE/BANDAGES/DRESSINGS) ×2 IMPLANT
ELECT CAUTERY BLADE 6.4 (BLADE) IMPLANT
ELECT REM PT RETURN 9FT ADLT (ELECTROSURGICAL) ×2
ELECTRODE REM PT RTRN 9FT ADLT (ELECTROSURGICAL) ×1 IMPLANT
EVACUATOR 1/8 PVC DRAIN (DRAIN) IMPLANT
GLOVE BIO SURGEON STRL SZ7.5 (GLOVE) ×2 IMPLANT
GLOVE BIO SURGEON STRL SZ8 (GLOVE) ×2 IMPLANT
GLOVE BIOGEL PI IND STRL 7.5 (GLOVE) ×1 IMPLANT
GLOVE BIOGEL PI IND STRL 8 (GLOVE) ×1 IMPLANT
GLOVE BIOGEL PI INDICATOR 7.5 (GLOVE) ×1
GLOVE BIOGEL PI INDICATOR 8 (GLOVE) ×1
GOWN PREVENTION PLUS XLARGE (GOWN DISPOSABLE) ×2 IMPLANT
GOWN STRL NON-REIN LRG LVL3 (GOWN DISPOSABLE) ×4 IMPLANT
HALF PIN 5.0X160 (PIN) ×12 IMPLANT
HANDPIECE INTERPULSE COAX TIP (DISPOSABLE)
KIT BASIN OR (CUSTOM PROCEDURE TRAY) ×2 IMPLANT
KIT ROOM TURNOVER OR (KITS) ×2 IMPLANT
MANIFOLD NEPTUNE II (INSTRUMENTS) ×2 IMPLANT
NEEDLE 22X1 1/2 (OR ONLY) (NEEDLE) IMPLANT
NS IRRIG 1000ML POUR BTL (IV SOLUTION) ×2 IMPLANT
PACK ORTHO EXTREMITY (CUSTOM PROCEDURE TRAY) ×2 IMPLANT
PAD ARMBOARD 7.5X6 YLW CONV (MISCELLANEOUS) ×4 IMPLANT
PAD NEG PRESSURE SENSATRAC (MISCELLANEOUS) ×2 IMPLANT
PADDING CAST COTTON 6X4 STRL (CAST SUPPLIES) ×6 IMPLANT
PIN CLAMP 2BAR 75MM BLUE (PIN) ×4 IMPLANT
PIN HALF 5.0X200MM (PIN) ×10 IMPLANT
PIN TRANSFIXING 5.0 (PIN) ×4 IMPLANT
SET HNDPC FAN SPRY TIP SCT (DISPOSABLE) IMPLANT
SPONGE GAUZE 4X4 12PLY (GAUZE/BANDAGES/DRESSINGS) ×2 IMPLANT
SPONGE LAP 18X18 X RAY DECT (DISPOSABLE) ×8 IMPLANT
SPONGE SCRUB IODOPHOR (GAUZE/BANDAGES/DRESSINGS) ×2 IMPLANT
STAPLER VISISTAT 35W (STAPLE) IMPLANT
STOCKINETTE IMPERVIOUS 9X36 MD (GAUZE/BANDAGES/DRESSINGS) ×2 IMPLANT
STOCKINETTE IMPERVIOUS LG (DRAPES) ×2 IMPLANT
STRIP CLOSURE SKIN 1/2X4 (GAUZE/BANDAGES/DRESSINGS) IMPLANT
SUCTION FRAZIER TIP 10 FR DISP (SUCTIONS) IMPLANT
SUT ETHILON 2 0 FS 18 (SUTURE) ×8 IMPLANT
SUT ETHILON 2 0 PSLX (SUTURE) ×6 IMPLANT
SUT ETHILON 3 0 PS 1 (SUTURE) IMPLANT
SUT PDS AB 2-0 CT1 27 (SUTURE) IMPLANT
SUT VIC AB 0 CT1 27 (SUTURE) ×2
SUT VIC AB 0 CT1 27XBRD ANBCTR (SUTURE) ×2 IMPLANT
SUT VIC AB 2-0 CT1 27 (SUTURE) ×2
SUT VIC AB 2-0 CT1 TAPERPNT 27 (SUTURE) ×2 IMPLANT
SYR CONTROL 10ML LL (SYRINGE) IMPLANT
TOWEL OR 17X24 6PK STRL BLUE (TOWEL DISPOSABLE) ×4 IMPLANT
TOWEL OR 17X26 10 PK STRL BLUE (TOWEL DISPOSABLE) ×12 IMPLANT
TUBE ANAEROBIC SPECIMEN COL (MISCELLANEOUS) IMPLANT
TUBE CONNECTING 12X1/4 (SUCTIONS) ×2 IMPLANT
UNDERPAD 30X30 INCONTINENT (UNDERPADS AND DIAPERS) ×2 IMPLANT
WATER STERILE IRR 1000ML POUR (IV SOLUTION) ×4 IMPLANT
YANKAUER SUCT BULB TIP NO VENT (SUCTIONS) ×2 IMPLANT
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
## 2012-07-03 NOTE — Anesthesia Postprocedure Evaluation (Signed)
  Anesthesia Post-op Note  Patient: Jill Bowman  Procedure(s) Performed: Procedure(s) (LRB): IRRIGATION AND DEBRIDEMENT EXTREMITY (Bilateral) EXTERNAL FIXATION LEG (Bilateral)  Patient Location: SICU  Anesthesia Type: General  Level of Consciousness: sedated and Patient remains intubated per anesthesia plan  Airway and Oxygen Therapy: Patient remains intubated per anesthesia plan and Patient placed on Ventilator (see vital sign flow sheet for setting)  Post-op Pain: none  Post-op Assessment: Post-op Vital signs reviewed, Patient's Cardiovascular Status Stable, Respiratory Function Stable, Patent Airway, No signs of Nausea or vomiting and Pain level controlled  Post-op Vital Signs: Reviewed and stable  Complications: No apparent anesthesia complications

## 2012-07-03 NOTE — ED Notes (Signed)
First unit of emergent blood started.
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
## 2012-07-03 NOTE — ED Notes (Signed)
30 mg of etomidate and 120 mg ancetine ivp for rsi. Pt intubated 71/2 23cm at the lip per Dr Criss Alvine. Placement verified respiratory at bedside.

## 2012-07-03 NOTE — ED Notes (Signed)
Complete first unit on reaction noted

## 2012-07-03 NOTE — ED Notes (Signed)
100mg  Rocuronium given per Carollee Herter RN IV

## 2012-07-03 NOTE — ED Notes (Addendum)
Log roll LSB removed pt incontent of stool with decrease in rectal tone

## 2012-07-03 NOTE — ED Notes (Signed)
se3cond unit of emergent blood started
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
## 2012-07-03 NOTE — ED Notes (Signed)
Transported to CT scan via stretcher on ccm with RT, MD, RNs at bedside; being bagged via BVM at 100% o2; EMS c collar remains intact
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
## 2012-07-03 NOTE — ED Notes (Signed)
To OR from CT scan via stretcher
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
## 2012-07-03 NOTE — Transfer of Care (Signed)
Immediate Anesthesia Transfer of Care Note  Patient: Jill Bowman  Procedure(s) Performed: Procedure(s) (LRB): IRRIGATION AND DEBRIDEMENT EXTREMITY (Bilateral) EXTERNAL FIXATION LEG (Bilateral)  Patient Location: SICU  Anesthesia Type: General  Level of Consciousness: sedated, unresponsive and Patient remains intubated per anesthesia plan  Airway & Oxygen Therapy: Patient remains intubated per anesthesia plan and Patient placed on Ventilator (see vital sign flow sheet for setting)  Post-op Assessment: Report given to PACU RN and Post -op Vital signs reviewed and stable  Post vital signs: Reviewed and stable  Complications: No apparent anesthesia complications

## 2012-07-03 NOTE — Progress Notes (Signed)
Ortho Trauma service  Post op pt had dopplerable pulses to B DP's Wound vacs functioning upon leaving OR   Mearl Latin, PA-C [facility] [phone] (P) 07/03/2012 10:24 PM

## 2012-07-03 NOTE — ED Notes (Signed)
Pt to ct scan with shannon rn

## 2012-07-03 NOTE — ED Notes (Signed)
Family at beside. Family given emotional support. 

## 2012-07-03 NOTE — Anesthesia Postprocedure Evaluation (Signed)
  Anesthesia Post-op Note  Patient: Jill Bowman  Procedure(s) Performed: Procedure(s) (LRB): IRRIGATION AND DEBRIDEMENT EXTREMITY (Bilateral) EXTERNAL FIXATION LEG (Bilateral)  Patient Location: SICU  Anesthesia Type: General  Level of Consciousness: sedated, unresponsive and Patient remains intubated per anesthesia plan  Airway and Oxygen Therapy: Patient remains intubated per anesthesia plan and Patient placed on Ventilator (see vital sign flow sheet for setting)  Post-op Pain: sedated on ventilator  Post-op Assessment: Post-op Vital signs reviewed, Patient's Cardiovascular Status Stable and Respiratory Function Stable  Post-op Vital Signs: Reviewed and stable  Complications: No apparent anesthesia complications
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
## 2012-07-03 NOTE — ED Notes (Signed)
CT scan in progress.

## 2012-07-03 NOTE — Progress Notes (Signed)
Orthopedic Tech Progress Note Patient Details:  Jill Bowman May 23, 1929 [id number]  Ortho Devices Type of Ortho Device: Ace wrap;Long leg splint;Stirrup splint Ortho Device/Splint Location: (R) (L) LE Ortho Device/Splint Interventions: Application   Jennye Moccasin 07/03/2012, 5:55 PM

## 2012-07-03 NOTE — Anesthesia Preprocedure Evaluation (Signed)
Anesthesia Evaluation  Patient identified by MRN, date of birth, ID band Patient unresponsive    Reviewed: Unable to perform ROS - Chart review only  Airway       Dental   Pulmonary          Cardiovascular     Neuro/Psych    GI/Hepatic   Endo/Other  Morbid obesity  Renal/GU      Musculoskeletal   Abdominal   Peds  Hematology   Anesthesia Other Findings   Reproductive/Obstetrics                           Anesthesia Physical Anesthesia Plan  ASA: IV and Emergent  Anesthesia Plan: General   Post-op Pain Management:    Induction: Intravenous  Airway Management Planned:   Additional Equipment:   Intra-op Plan:   Post-operative Plan: Post-operative intubation/ventilation  Informed Consent:   Only emergency history available  Plan Discussed with: CRNA, Anesthesiologist and Surgeon  Anesthesia Plan Comments:         Anesthesia Quick Evaluation

## 2012-07-03 NOTE — Brief Op Note (Signed)
07/03/2012  9:45 PM  PATIENT:  Jill Bowman  78 y.o. female  PRE-OPERATIVE DIAGNOSIS:  Open Bilateral tibial fractures, bilateral distal femur periprosthetic fractures  POST-OPERATIVE DIAGNOSIS:   1. Open left tibial shaft grade 3A 2. Open right distal pilon and shaft fracture 3. Bilateral distal femur fractures, periprosthetic fractures 4. Left second, third, fourth, fifth metatarsal neck fractures  PROCEDURE:  Procedure(s) (LRB): 1. I&D open bilateral tibiae fractures 2. Spanning ex-fix and closed reduction of right and left distal femur fractures and bilateral tibiae fractures 3. Placement of antibiotic beads right tibia shaft and pilon 4. Wound vac large left 5. Wound vac right medium  SURGEON:  Surgeon(s) and Role:    * Budd Palmer, MD - Primary  PHYSICIAN ASSISTANT: Montez Morita, [facility]  ANESTHESIA:   general  EBL:  Total I/O In: 6924 [I.V.:2400; Blood:4024; IV Piggyback:500] Out: 1350 [Urine:350; Blood:1000]  DRAINS: 2 wound vacs   LOCAL MEDICATIONS USED:  NONE  SPECIMEN:  No Specimen  DISPOSITION OF SPECIMEN:  N/A  COUNTS:  YES  TOURNIQUET:  * Missing tourniquet times found for documented tourniquets in log:  [id number] *  DICTATION: dictation number:[id number]  PLAN OF CARE: Admit to inpatient   PATIENT DISPOSITION:  ICU - intubated and hemodynamically stable.   Delay start of Pharmacological VTE agent (>24hrs) due to surgical blood loss or risk of bleeding: yes

## 2012-07-03 NOTE — ED Notes (Signed)
Paged Dr. Yevette Edwards to [phone]

## 2012-07-03 NOTE — ED Notes (Signed)
RIJ triple lumen per Dr Lindie Spruce

## 2012-07-03 NOTE — ED Notes (Signed)
PCXR confirm to use triple lumen per Dr Lindie Spruce

## 2012-07-03 NOTE — ED Notes (Signed)
Fast complete ortho tech at bedside to splint bilateral lower legs

## 2012-07-04 ENCOUNTER — Encounter (HOSPITAL_COMMUNITY): Payer: Self-pay | Admitting: Orthopedic Surgery

## 2012-07-04 ENCOUNTER — Inpatient Hospital Stay (HOSPITAL_COMMUNITY): Payer: Medicare Other

## 2012-07-04 DIAGNOSIS — S92309A Fracture of unspecified metatarsal bone(s), unspecified foot, initial encounter for closed fracture: Secondary | ICD-10-CM

## 2012-07-04 DIAGNOSIS — D62 Acute posthemorrhagic anemia: Secondary | ICD-10-CM

## 2012-07-04 DIAGNOSIS — S82871C Displaced pilon fracture of right tibia, initial encounter for open fracture type IIIA, IIIB, or IIIC: Secondary | ICD-10-CM

## 2012-07-04 DIAGNOSIS — M9711XA Periprosthetic fracture around internal prosthetic right knee joint, initial encounter: Secondary | ICD-10-CM

## 2012-07-04 DIAGNOSIS — S82202B Unspecified fracture of shaft of left tibia, initial encounter for open fracture type I or II: Secondary | ICD-10-CM

## 2012-07-04 DIAGNOSIS — S81809A Unspecified open wound, unspecified lower leg, initial encounter: Secondary | ICD-10-CM | POA: Diagnosis present

## 2012-07-04 DIAGNOSIS — M9712XA Periprosthetic fracture around internal prosthetic left knee joint, initial encounter: Secondary | ICD-10-CM

## 2012-07-04 HISTORY — DX: Fracture of unspecified metatarsal bone(s), unspecified foot, initial encounter for closed fracture: S92.309A

## 2012-07-04 HISTORY — DX: Periprosthetic fracture around internal prosthetic right knee joint, initial encounter: M97.11XA

## 2012-07-04 HISTORY — DX: Periprosthetic fracture around internal prosthetic left knee joint, initial encounter: M97.12XA

## 2012-07-04 HISTORY — DX: Displaced pilon fracture of right tibia, initial encounter for open fracture type IIIA, IIIB, or IIIC: S82.871C

## 2012-07-04 HISTORY — DX: Unspecified fracture of shaft of left tibia, initial encounter for open fracture type I or II: S82.202B

## 2012-07-04 LAB — PREPARE FRESH FROZEN PLASMA
Unit division: 0
Unit division: 0
Unit division: 0
Unit division: 0
Unit division: 0
Unit division: 0

## 2012-07-04 LAB — CBC
HCT: 21 % — ABNORMAL LOW (ref 36.0–46.0)
Hemoglobin: 7.5 g/dL — ABNORMAL LOW (ref 12.0–15.0)
MCH: 29.8 pg (ref 26.0–34.0)
MCHC: 36.4 g/dL — ABNORMAL HIGH (ref 30.0–36.0)
MCV: 81.9 fL (ref 78.0–100.0)
MCV: 83.7 fL (ref 78.0–100.0)
Platelets: 57 10*3/uL — ABNORMAL LOW (ref 150–400)
RDW: 15.5 % (ref 11.5–15.5)
WBC: 5.2 10*3/uL (ref 4.0–10.5)

## 2012-07-04 LAB — URINALYSIS, MICROSCOPIC ONLY
Bilirubin Urine: NEGATIVE
Glucose, UA: NEGATIVE mg/dL
Ketones, ur: NEGATIVE mg/dL
Specific Gravity, Urine: 1.027 (ref 1.005–1.030)
pH: 5 (ref 5.0–8.0)

## 2012-07-04 LAB — GLUCOSE, CAPILLARY
Glucose-Capillary: 100 mg/dL — ABNORMAL HIGH (ref 70–99)
Glucose-Capillary: 90 mg/dL (ref 70–99)
Glucose-Capillary: 125 mg/dL — ABNORMAL HIGH (ref 70–99)
Glucose-Capillary: 122 mg/dL — ABNORMAL HIGH (ref 70–99)
Glucose-Capillary: 116 mg/dL — ABNORMAL HIGH (ref 70–99)

## 2012-07-04 LAB — COMPREHENSIVE METABOLIC PANEL
ALT: 31 U/L (ref 0–35)
AST: 75 U/L — ABNORMAL HIGH (ref 0–37)
Albumin: 2.7 g/dL — ABNORMAL LOW (ref 3.5–5.2)
Alkaline Phosphatase: 46 U/L (ref 39–117)
BUN: 19 mg/dL (ref 6–23)
CO2: 27 mEq/L (ref 19–32)
Calcium: 8 mg/dL — ABNORMAL LOW (ref 8.4–10.5)
Chloride: 110 mEq/L (ref 96–112)
Creatinine, Ser: 0.99 mg/dL (ref 0.50–1.10)
GFR calc Af Amer: 60 mL/min — ABNORMAL LOW (ref >90)
GFR calc non Af Amer: 52 mL/min — ABNORMAL LOW (ref >90)
Glucose, Bld: 126 mg/dL — ABNORMAL HIGH (ref 70–99)
Potassium: 4.2 mEq/L (ref 3.5–5.1)
Sodium: 146 mEq/L — ABNORMAL HIGH (ref 135–145)
Total Bilirubin: 0.8 mg/dL (ref 0.3–1.2)
Total Protein: 4.6 g/dL — ABNORMAL LOW (ref 6.0–8.3)

## 2012-07-04 LAB — PREPARE PLATELET PHERESIS

## 2012-07-04 LAB — PROTIME-INR
INR: 1.29 (ref 0.00–1.49)
INR: 1.37 (ref 0.00–1.49)
Prothrombin Time: 16.3 seconds — ABNORMAL HIGH (ref 11.6–15.2)

## 2012-07-04 LAB — LACTIC ACID, PLASMA
Lactic Acid, Venous: 2.1 mmol/L (ref 0.5–2.2)
Lactic Acid, Venous: 3.7 mmol/L — ABNORMAL HIGH (ref 0.5–2.2)

## 2012-07-04 MED ORDER — KCL IN DEXTROSE-NACL 20-5-0.45 MEQ/L-%-% IV SOLN
INTRAVENOUS | Status: DC
  Administered 2012-07-04: 09:00:00 via INTRAVENOUS
  Administered 2012-07-05: 125 mL/h via INTRAVENOUS
  Administered 2012-07-06: 08:00:00 via INTRAVENOUS
  Administered 2012-07-07: 125 mL/h via INTRAVENOUS
  Administered 2012-07-07 – 2012-07-11 (×8): via INTRAVENOUS
  Filled 2012-07-04 (×24): qty 1000
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
## 2012-07-04 NOTE — ED Provider Notes (Signed)
I saw and evaluated the patient, reviewed the resident's note and I agree with the findings and plan.   .Face to face Exam:  General:  Awake HEENT:  Atraumatic Resp:  Normal effort Abd:  Nondistended Neuro:No focal weakness Lymph: No adenopathy   Nelia Shi, MD 07/04/12 [phone]

## 2012-07-04 NOTE — Progress Notes (Signed)
Follow up - Trauma and Critical Care  Patient Details:    Jill Bowman is an 78 y.o. female.  Lines/tubes : CVC Triple Lumen 07/03/12 Right Internal jugular (Active)  Indication for Insertion or Continuance of Line Prolonged intravenous therapies 07/04/2012  1:00 AM  Proximal Lumen Status Infusing 07/04/2012  1:00 AM  Medial Infusing 07/04/2012  1:00 AM  Distal Lumen Status Infusing 07/04/2012  1:00 AM  Dressing Type Transparent 07/04/2012  1:00 AM  Dressing Status Clean;Dry;Intact;Antimicrobial disc in place 07/04/2012  1:00 AM  Line Care Connections checked and tightened 07/04/2012  1:00 AM     Arterial Line Right Radial (Active)  Site Assessment Clean;Dry;Intact 07/04/2012  1:00 AM  Line Status Pulsatile blood flow 07/04/2012  1:00 AM  Art Line Waveform Whip 07/04/2012  1:00 AM  Art Line Interventions Zeroed and calibrated;Connections checked and tightened 07/04/2012  1:00 AM  Color/Movement/Sensation Capillary refill less than 3 sec 07/04/2012  1:00 AM  Dressing Type Transparent 07/04/2012  1:00 AM  Dressing Status Clean;Dry;Intact 07/04/2012  1:00 AM     Negative Pressure Wound Therapy Leg Right;Lower (Active)  Site / Wound Assessment Clean;Dry 07/03/2012 10:30 PM  Cycle Continuous 07/03/2012 10:30 PM  Target Pressure (mmHg) 125 07/03/2012 10:30 PM  Canister Changed No 07/03/2012 10:30 PM  Dressing Status Intact 07/03/2012 10:30 PM  Drainage Amount Moderate 07/03/2012 10:30 PM  Drainage Description Sanguineous 07/03/2012 10:30 PM  Output (mL) 50 mL 07/04/2012  6:00 AM     Negative Pressure Wound Therapy Leg Lower;Left (Active)  Site / Wound Assessment Clean;Dry 07/03/2012 10:30 PM  Cycle Continuous 07/03/2012 10:30 PM  Target Pressure (mmHg) 125 07/03/2012 10:30 PM  Canister Changed No 07/03/2012 10:30 PM  Dressing Status Intact 07/03/2012 10:30 PM  Drainage Amount Moderate 07/03/2012 10:30 PM  Drainage Description Sanguineous 07/03/2012 10:30 PM  Output (mL) 50 mL 07/04/2012  6:00 AM     Urethral Catheter Temperature  probe (Active)  Indication for Insertion or Continuance of Catheter Urinary output monitoring;Prolonged immobilization;Perioperative use;Physician order 07/03/2012 10:30 PM  Site Assessment Clean;Intact 07/03/2012 10:30 PM  Collection Container Standard drainage bag 07/03/2012 10:30 PM  Securement Method Leg strap 07/03/2012 10:30 PM  Output (mL) 150 mL 07/04/2012  6:00 AM    Microbiology/Sepsis markers: Results for orders placed during the hospital encounter of 07/03/12  MRSA PCR SCREENING     Status: Normal   Collection Time   07/03/12 10:21 PM      Component Value Range Status Comment   MRSA by PCR NEGATIVE  NEGATIVE Final     Anti-infectives:  Anti-infectives     Start     Dose/Rate Route Frequency Ordered Stop   07/03/12 2300   ceFAZolin (ANCEF) IVPB 2 g/50 mL premix        2 g 100 mL/hr over 30 Minutes Intravenous 3 times per day 07/03/12 1731     07/03/12 1930   tobramycin (NEBCIN) powder 1.2 g        1.2 g Topical To Surgery 07/03/12 1917 07/03/12 2135   07/03/12 1930   vancomycin (VANCOCIN) powder 1,000 mg        1,000 mg Other To Surgery 07/03/12 1917 07/03/12 2136   07/03/12 1745   gentamicin (GARAMYCIN) IVPB 100 mg        100 mg 200 mL/hr over 30 Minutes Intravenous  Once 07/03/12 1733     07/03/12 1630   ceFAZolin (ANCEF) IVPB 1 g/50 mL premix        1 g 100  mL/hr over 30 Minutes Intravenous STAT 07/03/12 1629 07/03/12 1700   07/03/12 1628   ceFAZolin (ANCEF) 1,000 mg in dextrose 5 % 50 mL IVPB        1,000 mg over 30 Minutes Intravenous As needed 07/03/12 1629 07/03/12 1628   07/03/12 1615   ceFAZolin (ANCEF) IVPB 2 g/50 mL premix        2 g 100 mL/hr over 30 Minutes Intravenous STAT 07/03/12 1611 07/03/12 1700          Best Practice/Protocols:  GI Prophylaxis: Proton Pump Inhibitor No DVT prophylaxis because of bleeding and both LEs are excluded because of surgery..  Will ask orthopedic surgery if Lovenox is okay. Continous Sedation  Consults: Treatment  Team:  Budd Palmer, MD    Events:  Subjective:    Overnight Issues: Hemoglobin and platelet counts were low, but amazingly hemodynamically stable.  Objective:  Vital signs for last 24 hours: Temp:  [95.4 F (35.2 C)-100.2 F (37.9 C)] 100.2 F (37.9 C) (08/08 0700) Pulse Rate:  [32-129] 88  (08/08 0700) Resp:  [3-29] 12  (08/08 0700) BP: (62-156)/(40-113) 113/46 mmHg (08/08 0700) SpO2:  [53 %-100 %] 100 % (08/08 0700) Arterial Line BP: (104-154)/(44-76) 109/44 mmHg (08/08 0700) FiO2 (%):  [40 %-100 %] 40 % (08/08 0600)  Hemodynamic parameters for last 24 hours:    Intake/Output from previous day: 08/07 0701 - 08/08 0700 In: 16109.6 [I.V.:5785.6; Blood:6282.5; IV Piggyback:600] Out: 2735 [Urine:1085; Drains:650; Blood:1000]  Intake/Output this shift:    Vent settings for last 24 hours: Vent Mode:  [-] PRVC FiO2 (%):  [40 %-100 %] 40 % Set Rate:  [12 bmp-18 bmp] 12 bmp Vt Set:  [500 mL-600 mL] 600 mL PEEP:  [5 cmH20] 5 cmH20 Plateau Pressure:  [10 cmH20-18 cmH20] 17 cmH20  Physical Exam:  General: no respiratory distress Neuro: nonfocal exam and RASS -2 Resp: clear to auscultation bilaterally GI: soft, nontender, BS WNL, no r/g Extremities: BIlteral LE wrapped and splinted.  Negative pressure wound dressings in place.  Results for orders placed during the hospital encounter of 07/03/12 (from the past 24 hour(s))  TYPE AND SCREEN     Status: Normal (Preliminary result)   Collection Time   07/03/12  4:12 PM      Component Value Range   ABO/RH(D) O POS     Antibody Screen POS     Sample Expiration 07/06/2012     DAT, IgG NEG     Antibody Identification ANTI-K     PT AG Type NEGATIVE FOR KELL ANTIGEN     Unit Number [id number]     Blood Component Type RED CELLS,LR     Unit division 00     Status of Unit ISSUED     Unit tag comment VERBAL ORDERS PER DR BEATON     Transfusion Status OK TO TRANSFUSE     Crossmatch Result COMPATIBLE     Donor AG Type NEGATIVE  FOR KELL ANTIGEN     Unit Number [id number]     Blood Component Type RED CELLS,LR     Unit division 00     Status of Unit ISSUED     Unit tag comment VERBAL ORDERS PER DR BEATON     Transfusion Status OK TO TRANSFUSE     Crossmatch Result COMPATIBLE     Donor AG Type NEGATIVE FOR KELL ANTIGEN     Unit Number [id number]     Blood Component Type RED CELLS,LR  Unit division 00     Status of Unit ISSUED     Unit tag comment VERBAL ORDERS PER DR BEATON     Transfusion Status OK TO TRANSFUSE     Crossmatch Result COMPATIBLE     Donor AG Type NEGATIVE FOR KELL ANTIGEN     Unit Number [id number]     Blood Component Type RBC LR PHER1     Unit division 00     Status of Unit ISSUED     Unit tag comment VERBAL ORDERS PER DR BEATON     Transfusion Status OK TO TRANSFUSE     Crossmatch Result COMPATIBLE     Donor AG Type NEGATIVE FOR KELL ANTIGEN     Unit Number [id number]     Blood Component Type RED CELLS,LR     Unit division 00     Status of Unit ISSUED     Transfusion Status OK TO TRANSFUSE     Crossmatch Result COMPATIBLE     Donor AG Type NEGATIVE FOR KELL ANTIGEN     Unit Number [id number]     Blood Component Type RED CELLS,LR     Unit division 00     Status of Unit ISSUED     Transfusion Status OK TO TRANSFUSE     Crossmatch Result COMPATIBLE     Donor AG Type NEGATIVE FOR KELL ANTIGEN     Unit Number [id number]     Blood Component Type RED CELLS,LR     Unit division 00     Status of Unit ISSUED     Donor AG Type NEGATIVE FOR KELL ANTIGEN     Transfusion Status OK TO TRANSFUSE     Crossmatch Result COMPATIBLE     Unit Number [id number]     Blood Component Type RED CELLS,LR     Unit division 00     Status of Unit ISSUED     Donor AG Type NEGATIVE FOR KELL ANTIGEN     Transfusion Status OK TO TRANSFUSE     Crossmatch Result COMPATIBLE     Unit Number [id number]     Blood Component Type RED CELLS,LR     Unit division 00     Status of Unit ISSUED     Donor AG Type NEGATIVE FOR  KELL ANTIGEN     Transfusion Status OK TO TRANSFUSE     Crossmatch Result COMPATIBLE     Unit Number [id number]     Blood Component Type RED CELLS,LR     Unit division 00     Status of Unit ISSUED     Donor AG Type NEGATIVE FOR KELL ANTIGEN     Transfusion Status OK TO TRANSFUSE     Crossmatch Result COMPATIBLE     Unit Number [id number]     Blood Component Type RED CELLS,LR     Unit division 00     Status of Unit ISSUED     Donor AG Type NEGATIVE FOR KELL ANTIGEN     Transfusion Status OK TO TRANSFUSE     Crossmatch Result COMPATIBLE     Unit Number [id number]     Blood Component Type RED CELLS,LR     Unit division 00     Status of Unit ALLOCATED     Donor AG Type NEGATIVE FOR KELL ANTIGEN     Transfusion Status OK TO TRANSFUSE     Crossmatch Result COMPATIBLE     Unit Number [id number]     Blood Component Type  RED CELLS,LR     Unit division 00     Status of Unit ISSUED     Donor AG Type NEGATIVE FOR KELL ANTIGEN     Transfusion Status OK TO TRANSFUSE     Crossmatch Result COMPATIBLE     Unit Number [id number]     Blood Component Type RED CELLS,LR     Unit division 00     Status of Unit ISSUED     Donor AG Type NEGATIVE FOR KELL ANTIGEN     Transfusion Status OK TO TRANSFUSE     Crossmatch Result COMPATIBLE     Unit Number [id number]     Blood Component Type RED CELLS,LR     Unit division 00     Status of Unit ALLOCATED     Donor AG Type NEGATIVE FOR KELL ANTIGEN     Transfusion Status OK TO TRANSFUSE     Crossmatch Result COMPATIBLE     Unit Number [id number]     Blood Component Type RED CELLS,LR     Unit division 00     Status of Unit ALLOCATED     Donor AG Type NEGATIVE FOR KELL ANTIGEN     Transfusion Status OK TO TRANSFUSE     Crossmatch Result COMPATIBLE     Unit Number [id number]     Blood Component Type RED CELLS,LR     Unit division 00     Status of Unit ALLOCATED     Transfusion Status OK TO TRANSFUSE     Crossmatch Result COMPATIBLE     Unit Number  [id number]     Blood Component Type RED CELLS,LR     Unit division 00     Status of Unit ALLOCATED     Transfusion Status OK TO TRANSFUSE     Crossmatch Result COMPATIBLE     Unit Number [id number]     Blood Component Type RED CELLS,LR     Unit division 00     Status of Unit ALLOCATED     Transfusion Status OK TO TRANSFUSE     Crossmatch Result COMPATIBLE    PREPARE FRESH FROZEN PLASMA     Status: Normal (Preliminary result)   Collection Time   07/03/12  4:25 PM      Component Value Range   Unit Number [id number]     Blood Component Type THAWED PLASMA     Unit division 00     Status of Unit ISSUED     Unit tag comment VERBAL ORDERS PER DR BEATON     Transfusion Status OK TO TRANSFUSE     Unit Number [id number]     Blood Component Type THAWED PLASMA     Unit division 00     Status of Unit ISSUED     Unit tag comment VERBAL ORDERS PER DR BEATON     Transfusion Status OK TO TRANSFUSE     Unit Number [id number]     Blood Component Type THAWED PLASMA     Unit division 00     Status of Unit ISSUED     Unit tag comment VERBAL ORDERS PER DR BEATON     Transfusion Status OK TO TRANSFUSE     Unit Number [id number]     Blood Component Type THAWED PLASMA     Unit division 00     Status of Unit ISSUED     Unit tag comment VERBAL ORDERS PER DR BEATON     Transfusion Status OK TO TRANSFUSE  CDS SEROLOGY     Status: Normal   Collection Time   07/03/12  4:43 PM      Component Value Range   CDS serology specimen       Value: SPECIMEN WILL BE HELD FOR 14 DAYS IF TESTING IS REQUIRED  COMPREHENSIVE METABOLIC PANEL     Status: Abnormal   Collection Time   07/03/12  4:43 PM      Component Value Range   Sodium 140  135 - 145 mEq/L   Potassium 3.9  3.5 - 5.1 mEq/L   Chloride 106  96 - 112 mEq/L   CO2 15 (*) 19 - 32 mEq/L   Glucose, Bld 271 (*) 70 - 99 mg/dL   BUN 20  6 - 23 mg/dL   Creatinine, Ser 1.19 (*) 0.50 - 1.10 mg/dL   Calcium 7.8 (*) 8.4 - 10.5 mg/dL   Total Protein 5.8 (*) 6.0 - 8.3  g/dL   Albumin 2.7 (*) 3.5 - 5.2 g/dL   AST 147 (*) 0 - 37 U/L   ALT 138 (*) 0 - 35 U/L   Alkaline Phosphatase 93  39 - 117 U/L   Total Bilirubin 0.2 (*) 0.3 - 1.2 mg/dL   GFR calc non Af Amer 36 (*) >90 mL/min   GFR calc Af Amer 42 (*) >90 mL/min  CBC     Status: Abnormal   Collection Time   07/03/12  4:43 PM      Component Value Range   WBC 21.8 (*) 4.0 - 10.5 K/uL   RBC 3.03 (*) 3.87 - 5.11 MIL/uL   Hemoglobin 9.6 (*) 12.0 - 15.0 g/dL   HCT 82.9 (*) 56.2 - 13.0 %   MCV 97.0  78.0 - 100.0 fL   MCH 31.7  26.0 - 34.0 pg   MCHC 32.7  30.0 - 36.0 g/dL   RDW 86.5  78.4 - 69.6 %   Platelets 274  150 - 400 K/uL  PROTIME-INR     Status: Abnormal   Collection Time   07/03/12  4:43 PM      Component Value Range   Prothrombin Time 15.5 (*) 11.6 - 15.2 seconds   INR 1.20  0.00 - 1.49  LACTIC ACID, PLASMA     Status: Abnormal   Collection Time   07/03/12  4:45 PM      Component Value Range   Lactic Acid, Venous 6.7 (*) 0.5 - 2.2 mmol/L  PREPARE FRESH FROZEN PLASMA     Status: Normal (Preliminary result)   Collection Time   07/03/12  5:03 PM      Component Value Range   Unit Number [id number]     Blood Component Type THAWED PLASMA     Unit division 00     Status of Unit REL FROM [facility]     Transfusion Status OK TO TRANSFUSE     Unit Number [id number]     Blood Component Type THAWED PLASMA     Unit division 00     Status of Unit ISSUED     Transfusion Status OK TO TRANSFUSE     Unit Number [id number]     Blood Component Type THAWED PLASMA     Unit division 00     Status of Unit REL FROM [facility]     Transfusion Status OK TO TRANSFUSE     Unit Number [id number]     Blood Component Type THAWED PLASMA     Unit division 00  Status of Unit REL FROM [facility]     Transfusion Status OK TO TRANSFUSE     Unit Number [id number]     Blood Component Type THAWED PLASMA     Unit division 00     Status of Unit ISSUED     Transfusion Status OK TO TRANSFUSE     Unit Number [id number]     Blood Component  Type THAWED PLASMA     Unit division 00     Status of Unit ISSUED     Transfusion Status OK TO TRANSFUSE     Unit Number [id number]     Blood Component Type THAWED PLASMA     Unit division 00     Status of Unit ISSUED     Transfusion Status OK TO TRANSFUSE    POCT I-STAT, CHEM 8     Status: Abnormal   Collection Time   07/03/12  5:23 PM      Component Value Range   Sodium 141  135 - 145 mEq/L   Potassium 4.8  3.5 - 5.1 mEq/L   Chloride 105  96 - 112 mEq/L   BUN 21  6 - 23 mg/dL   Creatinine, Ser 2.44 (*) 0.50 - 1.10 mg/dL   Glucose, Bld 010 (*) 70 - 99 mg/dL   Calcium, Ion 2.72 (*) 1.13 - 1.30 mmol/L   TCO2 19  0 - 100 mmol/L   Hemoglobin 8.8 (*) 12.0 - 15.0 g/dL   HCT 53.6 (*) 64.4 - 03.4 %   Comment NOTIFIED PHYSICIAN    PREPARE RBC (CROSSMATCH)     Status: Normal   Collection Time   07/03/12  5:30 PM      Component Value Range   Order Confirmation ORDER PROCESSED BY BLOOD BANK    POCT I-STAT 7, (LYTES, BLD GAS, ICA,H+H)     Status: Abnormal   Collection Time   07/03/12  6:45 PM      Component Value Range   pH, Arterial 7.445  7.350 - 7.450   pCO2 arterial 32.7 (*) 35.0 - 45.0 mmHg   pO2, Arterial 397.0 (*) 80.0 - 100.0 mmHg   Bicarbonate 22.8  20.0 - 24.0 mEq/L   TCO2 24  0 - 100 mmol/L   O2 Saturation 100.0     Acid-base deficit 1.0  0.0 - 2.0 mmol/L   Sodium 141  135 - 145 mEq/L   Potassium 3.6  3.5 - 5.1 mEq/L   Calcium, Ion 0.99 (*) 1.13 - 1.30 mmol/L   HCT 20.0 (*) 36.0 - 46.0 %   Hemoglobin 6.8 (*) 12.0 - 15.0 g/dL   Patient temperature 74.2 C     Sample type ARTERIAL     Comment NOTIFIED PHYSICIAN    TYPE AND SCREEN     Status: Normal   Collection Time   07/03/12  7:00 PM      Component Value Range   ABO/RH(D) O POS     Antibody Screen POS     Sample Expiration 07/06/2012    POCT I-STAT GLUCOSE     Status: Abnormal   Collection Time   07/03/12  8:04 PM      Component Value Range   Operator id [id number]     Glucose, Bld 253 (*) 70 - 99 mg/dL  POCT I-STAT 7,  (LYTES, BLD GAS, ICA,H+H)     Status: Abnormal   Collection Time   07/03/12  8:09 PM      Component Value Range   pH,  Arterial 7.383  7.350 - 7.450   pCO2 arterial 36.0  35.0 - 45.0 mmHg   pO2, Arterial 474.0 (*) 80.0 - 100.0 mmHg   Bicarbonate 21.8  20.0 - 24.0 mEq/L   TCO2 23  0 - 100 mmol/L   O2 Saturation 100.0     Acid-base deficit 3.0 (*) 0.0 - 2.0 mmol/L   Sodium 142  135 - 145 mEq/L   Potassium 3.8  3.5 - 5.1 mEq/L   Calcium, Ion 0.83 (*) 1.13 - 1.30 mmol/L   HCT 21.0 (*) 36.0 - 46.0 %   Hemoglobin 7.1 (*) 12.0 - 15.0 g/dL   Patient temperature 29.5 C     Sample type ARTERIAL    PREPARE FRESH FROZEN PLASMA     Status: Normal (Preliminary result)   Collection Time   07/03/12  8:54 PM      Component Value Range   Unit Number [id number]     Blood Component Type THAWED PLASMA     Unit division 00     Status of Unit ISSUED     Transfusion Status OK TO TRANSFUSE     Unit Number [id number]     Blood Component Type THAWED PLASMA     Unit division 00     Status of Unit ISSUED     Transfusion Status OK TO TRANSFUSE    PREPARE PLATELET PHERESIS     Status: Normal (Preliminary result)   Collection Time   07/03/12  8:54 PM      Component Value Range   Unit Number [id number]     Blood Component Type PLTPHER LR1     Unit division 00     Status of Unit ISSUED     Transfusion Status OK TO TRANSFUSE    POCT I-STAT 7, (LYTES, BLD GAS, ICA,H+H)     Status: Abnormal   Collection Time   07/03/12  8:54 PM      Component Value Range   pH, Arterial 7.410  7.350 - 7.450   pCO2 arterial 33.9 (*) 35.0 - 45.0 mmHg   pO2, Arterial 436.0 (*) 80.0 - 100.0 mmHg   Bicarbonate 21.9  20.0 - 24.0 mEq/L   TCO2 23  0 - 100 mmol/L   O2 Saturation 100.0     Acid-base deficit 3.0 (*) 0.0 - 2.0 mmol/L   Sodium 142  135 - 145 mEq/L   Potassium 4.1  3.5 - 5.1 mEq/L   Calcium, Ion 1.13  1.13 - 1.30 mmol/L   HCT 25.0 (*) 36.0 - 46.0 %   Hemoglobin 8.5 (*) 12.0 - 15.0 g/dL   Patient temperature 87.5 C      Sample type ARTERIAL    CBC     Status: Abnormal   Collection Time   07/03/12  9:02 PM      Component Value Range   WBC 9.3  4.0 - 10.5 K/uL   RBC 3.13 (*) 3.87 - 5.11 MIL/uL   Hemoglobin 9.2 (*) 12.0 - 15.0 g/dL   HCT 64.3 (*) 32.9 - 51.8 %   MCV 83.7  78.0 - 100.0 fL   MCH 29.4  26.0 - 34.0 pg   MCHC 35.1  30.0 - 36.0 g/dL   RDW 84.1 (*) 66.0 - 63.0 %   Platelets 51 (*) 150 - 400 K/uL  PROTIME-INR     Status: Abnormal   Collection Time   07/03/12  9:02 PM      Component Value Range   Prothrombin Time 19.1 (*)  11.6 - 15.2 seconds   INR 1.57 (*) 0.00 - 1.49  APTT     Status: Normal   Collection Time   07/03/12  9:02 PM      Component Value Range   aPTT 37  24 - 37 seconds  MRSA PCR SCREENING     Status: Normal   Collection Time   07/03/12 10:21 PM      Component Value Range   MRSA by PCR NEGATIVE  NEGATIVE  POCT I-STAT 3, BLOOD GAS (G3+)     Status: Abnormal   Collection Time   07/03/12 11:45 PM      Component Value Range   pH, Arterial 7.441  7.350 - 7.450   pCO2 arterial 35.8  35.0 - 45.0 mmHg   pO2, Arterial 230.0 (*) 80.0 - 100.0 mmHg   Bicarbonate 24.8 (*) 20.0 - 24.0 mEq/L   TCO2 26  0 - 100 mmol/L   O2 Saturation 100.0     Patient temperature 35.4 C     Collection site ARTERIAL LINE     Drawn by Operator     Sample type ARTERIAL    LACTIC ACID, PLASMA     Status: Abnormal   Collection Time   07/03/12 11:59 PM      Component Value Range   Lactic Acid, Venous 3.7 (*) 0.5 - 2.2 mmol/L  APTT     Status: Normal   Collection Time   07/03/12 11:59 PM      Component Value Range   aPTT 33  24 - 37 seconds  PROTIME-INR     Status: Abnormal   Collection Time   07/03/12 11:59 PM      Component Value Range   Prothrombin Time 17.1 (*) 11.6 - 15.2 seconds   INR 1.37  0.00 - 1.49  CBC     Status: Abnormal   Collection Time   07/03/12 11:59 PM      Component Value Range   WBC 5.2  4.0 - 10.5 K/uL   RBC 2.51 (*) 3.87 - 5.11 MIL/uL   Hemoglobin 7.5 (*) 12.0 - 15.0 g/dL   HCT 40.9  (*) 81.1 - 46.0 %   MCV 83.7  78.0 - 100.0 fL   MCH 29.9  26.0 - 34.0 pg   MCHC 35.7  30.0 - 36.0 g/dL   RDW 91.4  78.2 - 95.6 %   Platelets 62 (*) 150 - 400 K/uL  GLUCOSE, CAPILLARY     Status: Abnormal   Collection Time   07/04/12 12:14 AM      Component Value Range   Glucose-Capillary 140 (*) 70 - 99 mg/dL  GLUCOSE, CAPILLARY     Status: Abnormal   Collection Time   07/04/12  4:40 AM      Component Value Range   Glucose-Capillary 100 (*) 70 - 99 mg/dL  CBC     Status: Abnormal   Collection Time   07/04/12  5:00 AM      Component Value Range   WBC 5.3  4.0 - 10.5 K/uL   RBC 2.82 (*) 3.87 - 5.11 MIL/uL   Hemoglobin 8.4 (*) 12.0 - 15.0 g/dL   HCT 21.3 (*) 08.6 - 57.8 %   MCV 81.9  78.0 - 100.0 fL   MCH 29.8  26.0 - 34.0 pg   MCHC 36.4 (*) 30.0 - 36.0 g/dL   RDW 46.9  62.9 - 52.8 %   Platelets 57 (*) 150 - 400 K/uL  COMPREHENSIVE METABOLIC PANEL  Status: Abnormal   Collection Time   07/04/12  5:00 AM      Component Value Range   Sodium 146 (*) 135 - 145 mEq/L   Potassium 4.2  3.5 - 5.1 mEq/L   Chloride 110  96 - 112 mEq/L   CO2 27  19 - 32 mEq/L   Glucose, Bld 126 (*) 70 - 99 mg/dL   BUN 19  6 - 23 mg/dL   Creatinine, Ser 4.09  0.50 - 1.10 mg/dL   Calcium 8.0 (*) 8.4 - 10.5 mg/dL   Total Protein 4.6 (*) 6.0 - 8.3 g/dL   Albumin 2.7 (*) 3.5 - 5.2 g/dL   AST 75 (*) 0 - 37 U/L   ALT 31  0 - 35 U/L   Alkaline Phosphatase 46  39 - 117 U/L   Total Bilirubin 0.8  0.3 - 1.2 mg/dL   GFR calc non Af Amer 52 (*) >90 mL/min   GFR calc Af Amer 60 (*) >90 mL/min  PROTIME-INR     Status: Abnormal   Collection Time   07/04/12  5:00 AM      Component Value Range   Prothrombin Time 16.3 (*) 11.6 - 15.2 seconds   INR 1.29  0.00 - 1.49  LACTIC ACID, PLASMA     Status: Normal   Collection Time   07/04/12  5:00 AM      Component Value Range   Lactic Acid, Venous 2.1  0.5 - 2.2 mmol/L     Assessment/Plan:   NEURO  Altered Mental Status:  sedation   Plan: No plans to extubate prior  to surgery tomorrow  PULM  Chest Wall Trauma rib fractures without pneumothoraces   Plan: CPM  CARDIO  No specific issues currently.  Has a pacemaker in place which is demand and not actively firing.   Plan: CPM  RENAL  Urine output is good.   Plan: No specific treatment  GI  Excellent bowel sounds. No NGT or OGT in place   Plan: Try to get feeding tube in place after surgery tomorrow.  ID  No specific concerns currently, but at risk for osteomyelitits   Plan: Prophylactic antibiotics  HEME  Anemia acute blood loss anemia)   Plan: Transfuse as needed.  ENDO Diabetes Mellitus (Type II) and Hyperglycemia (stress related and history of DM)   Plan: Treat accordingly.  Global Issues  Global problem from multiple limb threatening injuries including prior prostheses.  Have had damage control orthopedic procedure by Dr. Carola Frost.  She is hemodynamically sable and has no apparent intra-abdominal or intra-thoracic injuries to address.    LOS: 1 day   Additional comments:I reviewed the patient's new clinical lab test results. cbc/bmet and I reviewed the patients new imaging test results. cxr  Critical Care Total Time*: 30 Minutes  Darrow Barreiro O 07/04/2012  *Care during the described time interval was provided by me and/or other providers on the critical care team.  I have reviewed this patient's available data, including medical history, events of note, physical examination and test results as part of my evaluation.

## 2012-07-04 NOTE — Clinical Documentation Improvement (Signed)
Anemia Blood Loss Clarification  THIS DOCUMENT IS NOT A PERMANENT PART OF THE MEDICAL RECORD  RESPOND TO THE THIS QUERY, FOLLOW THE INSTRUCTIONS BELOW:  1. If needed, update documentation for the patient's encounter via the notes activity.  2. Access this query again and click edit on the In [employer].  3. After updating, or not, click F2 to complete all highlighted (required) fields concerning your review. Select "additional documentation in the medical record" OR "no additional documentation provided".  4. Click Sign note button.  5. The deficiency will fall out of your In Basket *Please let us know if you are not able to complete this workflow by phone or e-mail (listed below).        07/04/12  Dear Jay Schlichter MD Marton Redwood  In an effort to better capture your patient's severity of illness, reflect appropriate length of stay and utilization of resources, a review of the patient medical record has revealed the following indicators.    Based on your clinical judgment, please clarify and document in a progress note and/or discharge summary the clinical condition associated with the following supporting information:  In responding to this query please exercise your independent judgment.  The fact that a query is asked, does not imply that any particular answer is desired or expected.  Possible Clinical Conditions?   " Expected Acute Blood Loss Anemia  " Acute Blood Loss Anemia  " Acute on chronic blood loss anemia  " Chronic blood loss anemia  " Other Condition  " Cannot Clinically Determine    Supporting Information:  Signs and Symptoms: Per Anesthesia record, 8/07: EBL: ; Albumin, 5%:  ; PRBC's:  4units; 4units FFP.  Diagnostics: H&H on admit:  9.6/29.4 H&H on 8/7:  6.8/20.0   Reviewed:  no additional documentation provided In the body of the note already the anemia has been designated acute blood loss anemia.  What else is necessary/   thanks  Thank You,  Marciano Sequin,  Clinical Documentation Specialist:  Pager: [phone]  [facility] [place]

## 2012-07-04 NOTE — Progress Notes (Signed)
UR complete 
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
## 2012-07-05 ENCOUNTER — Encounter (HOSPITAL_COMMUNITY): Admission: EM | Disposition: A | Discharge status: Skilled Nursing Facility | Payer: Self-pay | Source: Home / Self Care

## 2012-07-05 ENCOUNTER — Inpatient Hospital Stay (HOSPITAL_COMMUNITY): Payer: Medicare Other

## 2012-07-05 ENCOUNTER — Encounter (HOSPITAL_COMMUNITY): Payer: Self-pay | Admitting: Anesthesiology

## 2012-07-05 ENCOUNTER — Encounter (HOSPITAL_COMMUNITY): Payer: Self-pay | Admitting: *Deleted

## 2012-07-05 ENCOUNTER — Inpatient Hospital Stay (HOSPITAL_COMMUNITY): Payer: Medicare Other | Admitting: Anesthesiology

## 2012-07-05 DIAGNOSIS — J9600 Acute respiratory failure, unspecified whether with hypoxia or hypercapnia: Secondary | ICD-10-CM

## 2012-07-05 DIAGNOSIS — E872 Acidosis: Secondary | ICD-10-CM

## 2012-07-05 DIAGNOSIS — J95821 Acute postprocedural respiratory failure: Secondary | ICD-10-CM

## 2012-07-05 HISTORY — PX: APPLICATION OF WOUND VAC: SHX5189

## 2012-07-05 HISTORY — PX: ORIF TIBIA FRACTURE: SHX5416

## 2012-07-05 HISTORY — PX: I&D EXTREMITY: SHX5045

## 2012-07-05 HISTORY — PX: EXTERNAL FIXATION REMOVAL: SHX5040

## 2012-07-05 HISTORY — PX: FEMUR IM NAIL: SHX1597

## 2012-07-05 LAB — GLUCOSE, CAPILLARY
Glucose-Capillary: 122 mg/dL — ABNORMAL HIGH (ref 70–99)
Glucose-Capillary: 131 mg/dL — ABNORMAL HIGH (ref 70–99)
Glucose-Capillary: 147 mg/dL — ABNORMAL HIGH (ref 70–99)

## 2012-07-05 LAB — CBC
HCT: 23.1 % — ABNORMAL LOW (ref 36.0–46.0)
Hemoglobin: 8.3 g/dL — ABNORMAL LOW (ref 12.0–15.0)
Hemoglobin: 10.3 g/dL — ABNORMAL LOW (ref 12.0–15.0)
MCH: 30.4 pg (ref 26.0–34.0)
MCH: 30.2 pg (ref 26.0–34.0)
MCHC: 35.9 g/dL (ref 30.0–36.0)
MCV: 84.6 fL (ref 78.0–100.0)
MCV: 84.2 fL (ref 78.0–100.0)
Platelets: 74 10*3/uL — ABNORMAL LOW (ref 150–400)
RBC: 3.41 MIL/uL — ABNORMAL LOW (ref 3.87–5.11)
WBC: 6.6 10*3/uL (ref 4.0–10.5)

## 2012-07-05 LAB — CBC WITH DIFFERENTIAL/PLATELET
Basophils Absolute: 0 10*3/uL (ref 0.0–0.1)
Eosinophils Absolute: 0.1 10*3/uL (ref 0.0–0.7)
Eosinophils Relative: 1 % (ref 0–5)
HCT: 20.8 % — ABNORMAL LOW (ref 36.0–46.0)
Lymphocytes Relative: 7 % — ABNORMAL LOW (ref 12–46)
MCH: 30.4 pg (ref 26.0–34.0)
MCHC: 36.1 g/dL — ABNORMAL HIGH (ref 30.0–36.0)
MCV: 84.2 fL (ref 78.0–100.0)
Monocytes Absolute: 1 10*3/uL (ref 0.1–1.0)
Platelets: 65 10*3/uL — ABNORMAL LOW (ref 150–400)
RDW: 16.6 % — ABNORMAL HIGH (ref 11.5–15.5)
WBC: 11.9 10*3/uL — ABNORMAL HIGH (ref 4.0–10.5)

## 2012-07-05 LAB — BASIC METABOLIC PANEL
CO2: 26 mEq/L (ref 19–32)
Calcium: 7.3 mg/dL — ABNORMAL LOW (ref 8.4–10.5)
Creatinine, Ser: 0.78 mg/dL (ref 0.50–1.10)
GFR calc non Af Amer: 76 mL/min — ABNORMAL LOW (ref >90)
Sodium: 140 mEq/L (ref 135–145)

## 2012-07-05 LAB — DIC (DISSEMINATED INTRAVASCULAR COAGULATION)PANEL
D-Dimer, Quant: 2.95 ug/mL-FEU — ABNORMAL HIGH (ref 0.00–0.48)
Fibrinogen: 319 mg/dL (ref 204–475)
Platelets: 59 10*3/uL — ABNORMAL LOW (ref 150–400)
Smear Review: NONE SEEN

## 2012-07-05 LAB — APTT: aPTT: 31 seconds (ref 24–37)

## 2012-07-05 LAB — PROTIME-INR: Prothrombin Time: 15.8 seconds — ABNORMAL HIGH (ref 11.6–15.2)

## 2012-07-05 SURGERY — INSERTION, INTRAMEDULLARY ROD, FEMUR
Anesthesia: General | Site: Leg Upper | Laterality: Bilateral | Wound class: Clean

## 2012-07-05 MED ORDER — ALBUMIN HUMAN 5 % IV SOLN
INTRAVENOUS | Status: DC | PRN
  Administered 2012-07-05: 09:00:00 via INTRAVENOUS

## 2012-07-05 MED ORDER — SODIUM CHLORIDE 0.9 % IV SOLN
10.0000 mg | INTRAVENOUS | Status: DC | PRN
  Administered 2012-07-05 (×2): 25 ug/min via INTRAVENOUS

## 2012-07-05 MED ORDER — PHENYLEPHRINE HCL 10 MG/ML IJ SOLN
INTRAMUSCULAR | Status: DC | PRN
  Administered 2012-07-05 (×5): 80 ug via INTRAVENOUS

## 2012-07-05 MED ORDER — VECURONIUM BROMIDE 10 MG IV SOLR
INTRAVENOUS | Status: DC | PRN
  Administered 2012-07-05 (×5): 5 mg via INTRAVENOUS

## 2012-07-05 MED ORDER — PROPOFOL 10 MG/ML IV EMUL
INTRAVENOUS | Status: DC | PRN
  Administered 2012-07-05: 50 mg via INTRAVENOUS

## 2012-07-05 MED ORDER — TOBRAMYCIN SULFATE 1.2 G IJ SOLR
INTRAMUSCULAR | Status: DC | PRN
  Administered 2012-07-05: 1.2 g

## 2012-07-05 MED ORDER — CALCIUM CHLORIDE 10 % IV SOLN
INTRAVENOUS | Status: DC | PRN
  Administered 2012-07-05 (×2): 250 mg via INTRAVENOUS
  Administered 2012-07-05: 500 mg via INTRAVENOUS
  Administered 2012-07-05: 250 mg via INTRAVENOUS

## 2012-07-05 MED ORDER — VANCOMYCIN HCL 1000 MG IV SOLR
INTRAVENOUS | Status: DC | PRN
  Administered 2012-07-05: 1000 mg

## 2012-07-05 MED ORDER — SODIUM CHLORIDE 0.9 % IV SOLN
INTRAVENOUS | Status: DC | PRN
  Administered 2012-07-05: 10:00:00 via INTRAVENOUS

## 2012-07-05 MED ORDER — VANCOMYCIN HCL 1000 MG IV SOLR
INTRAVENOUS | Status: AC
  Filled 2012-07-05: qty 1000

## 2012-07-05 MED ORDER — MIDAZOLAM HCL 5 MG/5ML IJ SOLN
INTRAMUSCULAR | Status: DC | PRN
  Administered 2012-07-05: 2 mg via INTRAVENOUS

## 2012-07-05 MED ORDER — TOBRAMYCIN SULFATE 1.2 G IJ SOLR
INTRAMUSCULAR | Status: AC
  Filled 2012-07-05: qty 1.2

## 2012-07-05 MED ORDER — EPHEDRINE SULFATE 50 MG/ML IJ SOLN
INTRAMUSCULAR | Status: DC | PRN
  Administered 2012-07-05 (×2): 5 mg via INTRAVENOUS

## 2012-07-05 MED ORDER — LACTATED RINGERS IV SOLN
INTRAVENOUS | Status: DC | PRN
  Administered 2012-07-05 (×2): via INTRAVENOUS

## 2012-07-05 MED ORDER — CEFAZOLIN SODIUM 1-5 GM-% IV SOLN
INTRAVENOUS | Status: AC
  Filled 2012-07-05: qty 100

## 2012-07-05 MED ORDER — LACTATED RINGERS IV SOLN
INTRAVENOUS | Status: DC | PRN
  Administered 2012-07-05 (×3): via INTRAVENOUS

## 2012-07-05 MED ORDER — HYDROMORPHONE HCL PF 1 MG/ML IJ SOLN: 0.2500 mg | INTRAMUSCULAR | Status: DC | PRN

## 2012-07-05 MED ORDER — SODIUM CHLORIDE 0.9 % IR SOLN
Status: DC | PRN
  Administered 2012-07-05: 8000 mL

## 2012-07-05 MED ORDER — FENTANYL CITRATE 0.05 MG/ML IJ SOLN
INTRAMUSCULAR | Status: DC | PRN
  Administered 2012-07-05 (×2): 50 ug via INTRAVENOUS
  Administered 2012-07-05: 250 ug via INTRAVENOUS
  Administered 2012-07-05: 50 ug via INTRAVENOUS
  Administered 2012-07-05: 100 ug via INTRAVENOUS

## 2012-07-05 MED ORDER — ONDANSETRON HCL 4 MG/2ML IJ SOLN: 4.0000 mg | Freq: Once | INTRAMUSCULAR | Status: AC | PRN

## 2012-07-05 SURGICAL SUPPLY — 129 items
BANDAGE ELASTIC 4 VELCRO ST LF (GAUZE/BANDAGES/DRESSINGS) IMPLANT
BANDAGE ELASTIC 6 VELCRO ST LF (GAUZE/BANDAGES/DRESSINGS) IMPLANT
BANDAGE ESMARK 6X9 LF (GAUZE/BANDAGES/DRESSINGS) IMPLANT
BANDAGE GAUZE ELAST BULKY 4 IN (GAUZE/BANDAGES/DRESSINGS) ×8 IMPLANT
BIT DRILL 2.5X110 QC LCP DISP (BIT) ×4 IMPLANT
BIT DRILL 2.8 (BIT) ×3
BIT DRILL 3.5 (BIT) ×3
BIT DRILL 3.5MM (BIT) ×3 IMPLANT
BIT DRILL CALIBRATED 4.3MMX365 (DRILL) ×3 IMPLANT
BIT DRILL CANN QC 2.8X165 (BIT) ×3 IMPLANT
BIT DRILL CROWE PNT TWST 4.5MM (DRILL) ×3 IMPLANT
BIT DRILL CROWE POINT TWST 4.3 (DRILL) ×3 IMPLANT
BLADE SURG 10 STRL SS (BLADE) IMPLANT
BLADE SURG ROTATE 9660 (MISCELLANEOUS) IMPLANT
BNDG COHESIVE 4X5 TAN STRL (GAUZE/BANDAGES/DRESSINGS) IMPLANT
BNDG COHESIVE 6X5 TAN STRL LF (GAUZE/BANDAGES/DRESSINGS) IMPLANT
BNDG ELASTIC 6X15 VLCR STRL LF (GAUZE/BANDAGES/DRESSINGS) ×8 IMPLANT
BNDG ESMARK 6X9 LF (GAUZE/BANDAGES/DRESSINGS)
BNDG GAUZE STRTCH 6 (GAUZE/BANDAGES/DRESSINGS) IMPLANT
BONE CEMENT PALACOSE (Orthopedic Implant) ×8 IMPLANT
BOWL SMART MIX CTS (DISPOSABLE) ×4 IMPLANT
BRUSH SCRUB DISP (MISCELLANEOUS) ×16 IMPLANT
CAP PIN PROTECTOR ORTHO WHT (CAP) ×16 IMPLANT
CEMENT BONE PALACOSE (Orthopedic Implant) ×6 IMPLANT
CLEANER TIP ELECTROSURG 2X2 (MISCELLANEOUS) IMPLANT
CLOTH BEACON ORANGE TIMEOUT ST (SAFETY) ×4 IMPLANT
COVER SURGICAL LIGHT HANDLE (MISCELLANEOUS) ×8 IMPLANT
CUFF TOURNIQUET SINGLE 18IN (TOURNIQUET CUFF) IMPLANT
CUFF TOURNIQUET SINGLE 24IN (TOURNIQUET CUFF) IMPLANT
CUFF TOURNIQUET SINGLE 34IN LL (TOURNIQUET CUFF) IMPLANT
DRAPE C-ARM 42X72 X-RAY (DRAPES) ×4 IMPLANT
DRAPE C-ARMOR (DRAPES) ×4 IMPLANT
DRAPE OEC MINIVIEW 54X84 (DRAPES) IMPLANT
DRAPE ORTHO SPLIT 77X108 STRL (DRAPES) ×1
DRAPE SURG ORHT 6 SPLT 77X108 (DRAPES) ×3 IMPLANT
DRAPE U-SHAPE 47X51 STRL (DRAPES) ×8 IMPLANT
DRILL BIT 2.8MM (BIT) ×1
DRILL BIT 3.5MM (BIT) ×1
DRILL CALIBRATED 4.3MMX365 (DRILL) ×4
DRILL CROWE POINT TWIST 4.3 (DRILL) ×4
DRILL CROWE POINT TWIST 4.5MM (DRILL) ×4
DRSG ADAPTIC 3X8 NADH LF (GAUZE/BANDAGES/DRESSINGS) IMPLANT
DRSG MEPILEX BORDER 4X4 (GAUZE/BANDAGES/DRESSINGS) ×8 IMPLANT
DRSG MEPITEL 4X7.2 (GAUZE/BANDAGES/DRESSINGS) ×8 IMPLANT
DRSG PAD ABDOMINAL 8X10 ST (GAUZE/BANDAGES/DRESSINGS) ×8 IMPLANT
ELECT CAUTERY BLADE 6.4 (BLADE) IMPLANT
ELECT REM PT RETURN 9FT ADLT (ELECTROSURGICAL) ×4
ELECTRODE REM PT RTRN 9FT ADLT (ELECTROSURGICAL) ×3 IMPLANT
EVACUATOR 1/8 PVC DRAIN (DRAIN) IMPLANT
EVACUATOR 3/16  PVC DRAIN (DRAIN)
EVACUATOR 3/16 PVC DRAIN (DRAIN) IMPLANT
Fem Nail Retro ×8 IMPLANT
GLOVE BIO SURGEON STRL SZ7.5 (GLOVE) IMPLANT
GLOVE BIO SURGEON STRL SZ8 (GLOVE) IMPLANT
GLOVE BIOGEL PI IND STRL 7.5 (GLOVE) ×3 IMPLANT
GLOVE BIOGEL PI IND STRL 8 (GLOVE) ×3 IMPLANT
GLOVE BIOGEL PI INDICATOR 7.5 (GLOVE) ×1
GLOVE BIOGEL PI INDICATOR 8 (GLOVE) ×1
GOWN PREVENTION PLUS XLARGE (GOWN DISPOSABLE) ×12 IMPLANT
GOWN STRL NON-REIN LRG LVL3 (GOWN DISPOSABLE) ×8 IMPLANT
GUIDEPIN 3.2X17.5 THRD DISP (PIN) ×4 IMPLANT
GUIDEWIRE BEAD TIP (WIRE) ×8 IMPLANT
HANDPIECE INTERPULSE COAX TIP (DISPOSABLE) ×1
K-WIRE ACE 1.6X6 (WIRE) ×20
KIT BASIN OR (CUSTOM PROCEDURE TRAY) ×4 IMPLANT
KIT ROOM TURNOVER OR (KITS) ×4 IMPLANT
KWIRE ACE 1.6X6 (WIRE) ×15 IMPLANT
MANIFOLD NEPTUNE II (INSTRUMENTS) ×4 IMPLANT
NAIL FLEX WIN 3.0MM (Nail) ×4 IMPLANT
NEEDLE 22X1 1/2 (OR ONLY) (NEEDLE) IMPLANT
NS IRRIG 1000ML POUR BTL (IV SOLUTION) ×4 IMPLANT
PACK ORTHO EXTREMITY (CUSTOM PROCEDURE TRAY) ×4 IMPLANT
PACK TOTAL JOINT (CUSTOM PROCEDURE TRAY) ×4 IMPLANT
PAD ARMBOARD 7.5X6 YLW CONV (MISCELLANEOUS) ×8 IMPLANT
PAD CAST 4YDX4 CTTN HI CHSV (CAST SUPPLIES) IMPLANT
PADDING CAST COTTON 4X4 STRL (CAST SUPPLIES)
PADDING CAST COTTON 6X4 STRL (CAST SUPPLIES) IMPLANT
SCREW CORT TI DBL LEAD 5X32 (Screw) ×12 IMPLANT
SCREW CORT TI DBL LEAD 5X34 (Screw) ×4 IMPLANT
SCREW CORT TI DBL LEAD 5X60 (Screw) ×12 IMPLANT
SCREW CORT TI DBL LEAD 5X65 (Screw) ×4 IMPLANT
SCREW CORT TI DBL LEAD 5X70 (Screw) ×12 IMPLANT
SCREW CORT TI DBL LEAD 5X75 (Screw) ×8 IMPLANT
SCREW CORTEX 3.5 22MM (Screw) ×1 IMPLANT
SCREW CORTEX 3.5 24MM (Screw) ×1 IMPLANT
SCREW CORTEX 3.5 28MM (Screw) ×1 IMPLANT
SCREW CORTEX 3.5 30MM (Screw) ×1 IMPLANT
SCREW CORTEX 3.5 32MM (Screw) ×1 IMPLANT
SCREW LOCK CORT ST 3.5X22 (Screw) ×3 IMPLANT
SCREW LOCK CORT ST 3.5X24 (Screw) ×3 IMPLANT
SCREW LOCK CORT ST 3.5X28 (Screw) ×3 IMPLANT
SCREW LOCK CORT ST 3.5X30 (Screw) ×3 IMPLANT
SCREW LOCK CORT ST 3.5X32 (Screw) ×3 IMPLANT
SCREW LOCK T15 FT 24X3.5X2.9X (Screw) ×6 IMPLANT
SCREW LOCK T15 FT 32X3.5X2.9X (Screw) ×3 IMPLANT
SCREW LOCKING 3.5X24 (Screw) ×2 IMPLANT
SCREW LOCKING 3.5X26 (Screw) ×4 IMPLANT
SCREW LOCKING 3.5X32 (Screw) ×1 IMPLANT
SET HNDPC FAN SPRY TIP SCT (DISPOSABLE) ×3 IMPLANT
SPONGE GAUZE 4X4 12PLY (GAUZE/BANDAGES/DRESSINGS) ×8 IMPLANT
SPONGE LAP 18X18 X RAY DECT (DISPOSABLE) ×12 IMPLANT
SPONGE SCRUB IODOPHOR (GAUZE/BANDAGES/DRESSINGS) IMPLANT
STAPLER VISISTAT 35W (STAPLE) ×4 IMPLANT
STOCKINETTE IMPERVIOUS 9X36 MD (GAUZE/BANDAGES/DRESSINGS) IMPLANT
STOCKINETTE IMPERVIOUS LG (DRAPES) IMPLANT
STRIP CLOSURE SKIN 1/2X4 (GAUZE/BANDAGES/DRESSINGS) IMPLANT
SUCTION FRAZIER TIP 10 FR DISP (SUCTIONS) ×4 IMPLANT
SUT ETHILON 2 0 FS 18 (SUTURE) ×4 IMPLANT
SUT ETHILON 2 0 PSLX (SUTURE) ×12 IMPLANT
SUT ETHILON 3 0 PS 1 (SUTURE) ×20 IMPLANT
SUT PDS AB 2-0 CT1 27 (SUTURE) ×12 IMPLANT
SUT PROLENE 0 CT 2 (SUTURE) IMPLANT
SUT VIC AB 0 CT1 27 (SUTURE)
SUT VIC AB 0 CT1 27XBRD ANBCTR (SUTURE) IMPLANT
SUT VIC AB 1 CT1 27 (SUTURE)
SUT VIC AB 1 CT1 27XBRD ANBCTR (SUTURE) IMPLANT
SUT VIC AB 2-0 CT1 27 (SUTURE) ×2
SUT VIC AB 2-0 CT1 TAPERPNT 27 (SUTURE) ×6 IMPLANT
SUT VIC AB 2-0 FS1 27 (SUTURE) ×4 IMPLANT
SYR 20ML ECCENTRIC (SYRINGE) IMPLANT
SYR CONTROL 10ML LL (SYRINGE) IMPLANT
TOWEL OR 17X24 6PK STRL BLUE (TOWEL DISPOSABLE) ×8 IMPLANT
TOWEL OR 17X26 10 PK STRL BLUE (TOWEL DISPOSABLE) ×8 IMPLANT
TRAY FOLEY CATH 14FR (SET/KITS/TRAYS/PACK) IMPLANT
TUBE ANAEROBIC SPECIMEN COL (MISCELLANEOUS) IMPLANT
TUBE CONNECTING 12X1/4 (SUCTIONS) ×4 IMPLANT
UNDERPAD 30X30 INCONTINENT (UNDERPADS AND DIAPERS) ×4 IMPLANT
WATER STERILE IRR 1000ML POUR (IV SOLUTION) IMPLANT
YANKAUER SUCT BULB TIP NO VENT (SUCTIONS) ×4 IMPLANT

## 2012-07-05 NOTE — Anesthesia Preprocedure Evaluation (Signed)
Anesthesia Evaluation  Patient identified by MRN, date of birth, ID band Patient unresponsive    Reviewed: Allergy & Precautions, H&P , NPO status , Patient's Chart, lab work & pertinent test results  Airway       Dental   Pulmonary          Cardiovascular + CAD + pacemaker Rhythm:regular Rate:Tachycardia     Neuro/Psych  C-spine not cleared    GI/Hepatic   Endo/Other  Type 2, Oral Hypoglycemic AgentsMorbid obesity  Renal/GU      Musculoskeletal   Abdominal   Peds  Hematology   Anesthesia Other Findings   Reproductive/Obstetrics                           Anesthesia Physical Anesthesia Plan  ASA: III  Anesthesia Plan: General   Post-op Pain Management:    Induction: Intravenous  Airway Management Planned:   Additional Equipment:   Intra-op Plan:   Post-operative Plan:   Informed Consent:   Plan Discussed with: CRNA, Anesthesiologist and Surgeon  Anesthesia Plan Comments:         Anesthesia Quick Evaluation

## 2012-07-05 NOTE — Progress Notes (Addendum)
Dr. Corliss Skains aware of DIC panel, CBC, PT/INR results. No new orders at this time. Thresa Ross RN
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
## 2012-07-05 NOTE — Brief Op Note (Signed)
07/03/2012 - 07/05/2012  5:21 PM  PATIENT:  Jill Bowman  78 y.o. female  PRE-OPERATIVE DIAGNOSIS:  Bilateral leg wounds and fratures  POST-OPERATIVE DIAGNOSIS:  Bilateral Femur Fractures, Bilateral Tibial fractures, Right Fibula fracture, Bilateral open wounds to lower leg.  PROCEDURE:  Procedure(s) (LRB): 1. IRRIGATION AND DEBRIDEMENT EXTREMITY (Bilateral) tibiae including bone 2. APPLICATION OF WOUND VAC (Bilateral) right and left tibiae 3. REMOVAL EXTERNAL FIXATION LEG (Bilateral)  4. OPEN REDUCTION INTERNAL FIXATION (ORIF) TIBIA FRACTURE (Bilateral) 5. INTRAMEDULLARY (IM) NAIL FEMORAL (Bilateral) 6. ORIF right tibia pilon, fibula and tibia 7. Antibiotic cement spacer placement  SURGEON:  Surgeon(s) and Role:    * Budd Palmer, MD - Primary  PHYSICIAN ASSISTANT: Montez Morita, [facility]  ANESTHESIA:   general  EBL:  Total I/O In: 7964 [I.V.:3250; MWNUU:7253; IV Piggyback:250] Out: 1200 [Urine:450; Blood:750]  BLOOD ADMINISTERED:6 u PRBC, 4 FFP, 2 PLTs, 2200 cryst, 250cc albumin  DRAINS: 2 wound vac   LOCAL MEDICATIONS USED:  NONE  SPECIMEN:  No Specimen  DISPOSITION OF SPECIMEN:  N/A  COUNTS:  YES  TOURNIQUET:  * No tourniquets in log *  DICTATION: .Other Dictation: Dictation Number [id number]  PLAN OF CARE: Discharge to home after PACU  PATIENT DISPOSITION:  PACU - hemodynamically stable.   Delay start of Pharmacological VTE agent (>24hrs) due to surgical blood loss or risk of bleeding: yes

## 2012-07-05 NOTE — Anesthesia Postprocedure Evaluation (Signed)
  Anesthesia Post-op Note  Patient: Jill Bowman  Procedure(s) Performed: Procedure(s) (LRB): IRRIGATION AND DEBRIDEMENT EXTREMITY (Bilateral) APPLICATION OF WOUND VAC (Bilateral) REMOVAL EXTERNAL FIXATION LEG (Bilateral) OPEN REDUCTION INTERNAL FIXATION (ORIF) TIBIA FRACTURE (Bilateral) INTRAMEDULLARY (IM) NAIL FEMORAL (Bilateral)  Patient Location: ICU  Anesthesia Type: General  Level of Consciousness: Patient remains intubated per anesthesia plan  Airway and Oxygen Therapy: Patient remains intubated per anesthesia plan  Post-op Pain: none  Post-op Assessment: Post-op Vital signs reviewed, Patient's Cardiovascular Status Stable, Respiratory Function Stable, Patent Airway, No signs of Nausea or vomiting and Pain level controlled  Post-op Vital Signs: stable  Complications: No apparent anesthesia complications

## 2012-07-05 NOTE — Transfer of Care (Signed)
Immediate Anesthesia Transfer of Care Note  Patient: Jill Bowman  Procedure(s) Performed: Procedure(s) (LRB): IRRIGATION AND DEBRIDEMENT EXTREMITY (Bilateral) APPLICATION OF WOUND VAC (Bilateral) REMOVAL EXTERNAL FIXATION LEG (Bilateral) OPEN REDUCTION INTERNAL FIXATION (ORIF) TIBIA FRACTURE (Bilateral) INTRAMEDULLARY (IM) NAIL FEMORAL (Bilateral)  Patient Location: SICU  Anesthesia Type: General  Level of Consciousness: sedated and unresponsive  Airway & Oxygen Therapy: Patient remains intubated per anesthesia plan and Patient placed on Ventilator (see vital sign flow sheet for setting)  Post-op Assessment: Report given to PACU RN and Post -op Vital signs reviewed and stable  Post vital signs: Reviewed and stable  Complications: No apparent anesthesia complications

## 2012-07-05 NOTE — Progress Notes (Signed)
Follow up - Trauma and Critical Care  Patient Details:    Jill Bowman is an 78 y.o. female.  Lines/tubes : CVC Triple Lumen 07/03/12 Right Internal jugular (Active)  Indication for Insertion or Continuance of Line Prolonged intravenous therapies 07/04/2012  7:59 PM  Site Assessment Clean;Dry;Intact 07/04/2012  7:59 PM  Proximal Lumen Status Infusing 07/04/2012  7:59 PM  Medial Infusing 07/04/2012  7:59 PM  Distal Lumen Status Capped (Central line) 07/04/2012  7:59 PM  Dressing Type Transparent 07/04/2012  7:59 PM  Dressing Status Clean;Dry;Intact;Antimicrobial disc in place 07/04/2012  7:59 PM  Line Care Connections checked and tightened 07/04/2012  7:59 PM  Dressing Change Due 07/10/12 07/04/2012  8:00 AM     Arterial Line Right Radial (Active)  Site Assessment Clean;Dry;Intact 07/04/2012  7:59 PM  Line Status Pulsatile blood flow 07/04/2012  7:59 PM  Art Line Waveform Appropriate 07/04/2012  7:59 PM  Art Line Interventions Zeroed and calibrated 07/04/2012  7:59 PM  Color/Movement/Sensation Capillary refill less than 3 sec 07/04/2012  7:59 PM  Dressing Type Transparent 07/04/2012  7:59 PM  Dressing Status Clean;Dry;Intact 07/04/2012  7:59 PM  Dressing Change Due 07/10/12 07/04/2012  8:00 AM     Negative Pressure Wound Therapy Leg Right;Lower (Active)  Site / Wound Assessment Clean;Dry 07/04/2012  7:30 PM  Cycle Continuous 07/04/2012  7:30 PM  Target Pressure (mmHg) 125 07/04/2012  7:30 PM  Canister Changed No 07/04/2012  7:30 PM  Dressing Status Intact 07/04/2012  7:30 PM  Drainage Amount Moderate 07/04/2012  7:30 PM  Drainage Description Sanguineous 07/04/2012  7:30 PM  Output (mL) 0 mL 07/05/2012  6:00 AM     Negative Pressure Wound Therapy Leg Lower;Left (Active)  Site / Wound Assessment Clean;Dry 07/04/2012  7:30 PM  Cycle Continuous 07/04/2012  7:30 PM  Target Pressure (mmHg) 125 07/04/2012  7:30 PM  Canister Changed No 07/04/2012  7:30 PM  Dressing Status Intact 07/04/2012  7:30 PM  Drainage Amount Moderate 07/04/2012   7:30 PM  Drainage Description Sanguineous 07/04/2012  7:30 PM  Output (mL) 0 mL 07/05/2012  6:00 AM     Urethral Catheter Temperature probe (Active)  Indication for Insertion or Continuance of Catheter Urinary output monitoring;Physician order 07/04/2012  7:30 PM  Site Assessment Clean;Intact 07/04/2012  7:30 PM  Collection Container Standard drainage bag 07/04/2012  7:30 PM  Securement Method Leg strap 07/04/2012  7:30 PM  Urinary Catheter Interventions Unclamped 07/04/2012  8:00 AM  Output (mL) 30 mL 07/05/2012  6:00 AM    Microbiology/Sepsis markers: Results for orders placed during the hospital encounter of 07/03/12  MRSA PCR SCREENING     Status: Normal   Collection Time   07/03/12 10:21 PM      Component Value Range Status Comment   MRSA by PCR NEGATIVE  NEGATIVE Final     Anti-infectives:  Anti-infectives     Start     Dose/Rate Route Frequency Ordered Stop   07/03/12 2300   ceFAZolin (ANCEF) IVPB 2 g/50 mL premix        2 g 100 mL/hr over 30 Minutes Intravenous 3 times per day 07/03/12 1731     07/03/12 1930   tobramycin (NEBCIN) powder 1.2 g        1.2 g Topical To Surgery 07/03/12 1917 07/03/12 2135   07/03/12 1930   vancomycin (VANCOCIN) powder 1,000 mg        1,000 mg Other To Surgery 07/03/12 1917 07/03/12 2136   07/03/12 1745  gentamicin (GARAMYCIN) IVPB 100 mg        100 mg 200 mL/hr over 30 Minutes Intravenous  Once 07/03/12 1733     07/03/12 1630   ceFAZolin (ANCEF) IVPB 1 g/50 mL premix        1 g 100 mL/hr over 30 Minutes Intravenous STAT 07/03/12 1629 07/03/12 1700   07/03/12 1628   ceFAZolin (ANCEF) 1,000 mg in dextrose 5 % 50 mL IVPB        1,000 mg over 30 Minutes Intravenous As needed 07/03/12 1629 07/03/12 1628   07/03/12 1615   ceFAZolin (ANCEF) IVPB 2 g/50 mL premix        2 g 100 mL/hr over 30 Minutes Intravenous STAT 07/03/12 1611 07/03/12 1700          Best Practice/Protocols:  GI Prophylaxis: Proton Pump Inhibitor May need to consider IVC  filter Continous Sedation  Consults: Treatment Team:  Budd Palmer, MD    Events:  Subjective:    Overnight Issues: A number of allergies have been mentioned by the family.  She has been relatively stable for the last 24 hours.  Objective:  Vital signs for last 24 hours: Temp:  [99.9 F (37.7 C)-101.1 F (38.4 C)] 100 F (37.8 C) (08/09 0735) Pulse Rate:  [54-119] 97  (08/09 0735) Resp:  [5-31] 14  (08/09 0735) BP: (85-130)/(27-70) 109/44 mmHg (08/09 0735) SpO2:  [95 %-100 %] 100 % (08/09 0735) Arterial Line BP: (101-142)/(40-56) 103/42 mmHg (08/09 0700) FiO2 (%):  [40 %] 40 % (08/09 0735) Weight:  [99.9 kg (220 lb 3.8 oz)-103.1 kg (227 lb 4.7 oz)] 99.9 kg (220 lb 3.8 oz) (08/09 0100)  Hemodynamic parameters for last 24 hours:    Intake/Output from previous day: 08/08 0701 - 08/09 0700 In: 3231 [I.V.:3071; IV Piggyback:160] Out: 1670 [Urine:1320; Drains:350]  Intake/Output this shift:    Vent settings for last 24 hours: Vent Mode:  [-] PRVC FiO2 (%):  [40 %] 40 % Set Rate:  [12 bmp] 12 bmp Vt Set:  [600 mL] 600 mL PEEP:  [5 cmH20] 5 cmH20 Plateau Pressure:  [16 cmH20-18 cmH20] 16 cmH20  Physical Exam:  General: no respiratory distress Neuro: alert, oriented, nonfocal exam and RASS -3 or deeper Resp: clear to auscultation bilaterally Extremities: bilateral lowerer extremity external fixation.  Results for orders placed during the hospital encounter of 07/03/12 (from the past 24 hour(s))  BLOOD TRANSFUSION REPORT - SCANNED     Status: Normal   Narrative:    A scan was deleted from the Results section by P Default [[id number]] on 07/04/2012 at  3:58 PM (File: [id number])  GLUCOSE, CAPILLARY     Status: Abnormal   Collection Time   07/04/12 11:41 AM      Component Value Range   Glucose-Capillary 125 (*) 70 - 99 mg/dL  URINALYSIS, WITH MICROSCOPIC     Status: Abnormal   Collection Time   07/04/12  3:00 PM      Component Value Range   Color, Urine  YELLOW  YELLOW   APPearance CLEAR  CLEAR   Specific Gravity, Urine 1.027  1.005 - 1.030   pH 5.0  5.0 - 8.0   Glucose, UA NEGATIVE  NEGATIVE mg/dL   Hgb urine dipstick TRACE (*) NEGATIVE   Bilirubin Urine NEGATIVE  NEGATIVE   Ketones, ur NEGATIVE  NEGATIVE mg/dL   Protein, ur NEGATIVE  NEGATIVE mg/dL   Urobilinogen, UA 0.2  0.0 - 1.0 mg/dL   Nitrite NEGATIVE  NEGATIVE   Leukocytes, UA TRACE (*) NEGATIVE   WBC, UA 3-6  <3 WBC/hpf   RBC / HPF 0-2  <3 RBC/hpf   Bacteria, UA FEW (*) RARE   Squamous Epithelial / LPF FEW (*) RARE   Casts HYALINE CASTS (*) NEGATIVE   Urine-Other MUCOUS PRESENT    GLUCOSE, CAPILLARY     Status: Abnormal   Collection Time   07/04/12  3:52 PM      Component Value Range   Glucose-Capillary 122 (*) 70 - 99 mg/dL   Comment 1 Documented in Chart     Comment 2 Notify RN    GLUCOSE, CAPILLARY     Status: Abnormal   Collection Time   07/04/12  7:49 PM      Component Value Range   Glucose-Capillary 116 (*) 70 - 99 mg/dL   Comment 1 Documented in Chart     Comment 2 Notify RN    GLUCOSE, CAPILLARY     Status: Abnormal   Collection Time   07/04/12 11:59 PM      Component Value Range   Glucose-Capillary 131 (*) 70 - 99 mg/dL  GLUCOSE, CAPILLARY     Status: Abnormal   Collection Time   07/05/12  3:54 AM      Component Value Range   Glucose-Capillary 122 (*) 70 - 99 mg/dL  CBC WITH DIFFERENTIAL     Status: Abnormal   Collection Time   07/05/12  4:40 AM      Component Value Range   WBC 11.9 (*) 4.0 - 10.5 K/uL   RBC 2.47 (*) 3.87 - 5.11 MIL/uL   Hemoglobin 7.5 (*) 12.0 - 15.0 g/dL   HCT 96.0 (*) 45.4 - 09.8 %   MCV 84.2  78.0 - 100.0 fL   MCH 30.4  26.0 - 34.0 pg   MCHC 36.1 (*) 30.0 - 36.0 g/dL   RDW 11.9 (*) 14.7 - 82.9 %   Platelets 65 (*) 150 - 400 K/uL   Neutrophils Relative 83 (*) 43 - 77 %   Neutro Abs 9.9 (*) 1.7 - 7.7 K/uL   Lymphocytes Relative 7 (*) 12 - 46 %   Lymphs Abs 0.9  0.7 - 4.0 K/uL   Monocytes Relative 8  3 - 12 %   Monocytes Absolute  1.0  0.1 - 1.0 K/uL   Eosinophils Relative 1  0 - 5 %   Eosinophils Absolute 0.1  0.0 - 0.7 K/uL   Basophils Relative 0  0 - 1 %   Basophils Absolute 0.0  0.0 - 0.1 K/uL  BASIC METABOLIC PANEL     Status: Abnormal   Collection Time   07/05/12  4:40 AM      Component Value Range   Sodium 140  135 - 145 mEq/L   Potassium 3.4 (*) 3.5 - 5.1 mEq/L   Chloride 106  96 - 112 mEq/L   CO2 26  19 - 32 mEq/L   Glucose, Bld 175 (*) 70 - 99 mg/dL   BUN 16  6 - 23 mg/dL   Creatinine, Ser 5.62  0.50 - 1.10 mg/dL   Calcium 7.3 (*) 8.4 - 10.5 mg/dL   GFR calc non Af Amer 76 (*) >90 mL/min   GFR calc Af Amer 88 (*) >90 mL/min  GLUCOSE, CAPILLARY     Status: Abnormal   Collection Time   07/05/12  7:54 AM      Component Value Range   Glucose-Capillary 131 (*) 70 - 99  mg/dL   Comment 1 Notify RN     Comment 2 Documented in Chart       Assessment/Plan:   NEURO  Altered Mental Status:  sedation   Plan: Continue sedation throughout after surgery  PULM  No specific problems   Plan: CPM  CARDIO  No specific problems   Plan: CPM  RENAL  Oliguria (probably hypovolemia)   Plan: Will need more blood and fluids  GI  No concerns   Plan: Will start tube feedings postoperatively if they are able to place OGT  ID  No specific issues   Plan: Prophylactic antibiotics only  HEME  Anemia acute blood loss anemia and moderate to severe)   Plan: Will need some blood during the operation today.  Should also get FFP and platelets also.  ENDO Hyperglycemia (diabetes)   Plan: CPM with coverage as scheduled  Global Issues  The pateint is going back to the OR to address her multiple orthopedic problems.  They are well aware of her significant anemia.  Six units of blood are available for her.  She should also get FFP and platelets    LOS: 2 days   Additional comments:I reviewed the patient's new clinical lab test results. cbc/bmet and I reviewed the patients new imaging test results. No CXR today  Critical  Care Total Time*: 30 Minutes  Azael Ragain O 07/05/2012  *Care during the described time interval was provided by me and/or other providers on the critical care team.  I have reviewed this patient's available data, including medical history, events of note, physical examination and test results as part of my evaluation.

## 2012-07-06 ENCOUNTER — Inpatient Hospital Stay (HOSPITAL_COMMUNITY): Payer: Medicare Other

## 2012-07-06 LAB — TYPE AND SCREEN
ABO/RH(D): O POS
Antibody Screen: POSITIVE
Donor AG Type: NEGATIVE
Donor AG Type: NEGATIVE
Donor AG Type: NEGATIVE
Donor AG Type: NEGATIVE
Donor AG Type: NEGATIVE
Donor AG Type: NEGATIVE
Donor AG Type: NEGATIVE
Donor AG Type: NEGATIVE
Donor AG Type: NEGATIVE
Unit division: 0
Unit division: 0
Unit division: 0
Unit division: 0
Unit division: 0
Unit division: 0

## 2012-07-06 LAB — CBC WITH DIFFERENTIAL/PLATELET
Basophils Absolute: 0 10*3/uL (ref 0.0–0.1)
Eosinophils Relative: 1 % (ref 0–5)
Lymphocytes Relative: 8 % — ABNORMAL LOW (ref 12–46)
MCV: 84.2 fL (ref 78.0–100.0)
Neutro Abs: 5.2 10*3/uL (ref 1.7–7.7)
Platelets: 74 10*3/uL — ABNORMAL LOW (ref 150–400)
RDW: 14.5 % (ref 11.5–15.5)
WBC: 6.3 10*3/uL (ref 4.0–10.5)

## 2012-07-06 LAB — MAGNESIUM: Magnesium: 1.3 mg/dL — ABNORMAL LOW (ref 1.5–2.5)

## 2012-07-06 LAB — PREPARE FRESH FROZEN PLASMA: Unit division: 0

## 2012-07-06 LAB — BLOOD GAS, ARTERIAL
Acid-base deficit: 3.2 mmol/L — ABNORMAL HIGH (ref 0.0–2.0)
Bicarbonate: 20.4 mEq/L (ref 20.0–24.0)
FIO2: 0.4 %
MECHVT: 600 mL
O2 Saturation: 100 %
Patient temperature: 98.6
TCO2: 21.3 mmol/L (ref 0–100)
pH, Arterial: 7.434 (ref 7.350–7.450)

## 2012-07-06 LAB — BASIC METABOLIC PANEL
CO2: 27 mEq/L (ref 19–32)
Calcium: 7.6 mg/dL — ABNORMAL LOW (ref 8.4–10.5)
Sodium: 138 mEq/L (ref 135–145)

## 2012-07-06 LAB — GLUCOSE, CAPILLARY
Glucose-Capillary: 102 mg/dL — ABNORMAL HIGH (ref 70–99)
Glucose-Capillary: 103 mg/dL — ABNORMAL HIGH (ref 70–99)
Glucose-Capillary: 109 mg/dL — ABNORMAL HIGH (ref 70–99)
Glucose-Capillary: 117 mg/dL — ABNORMAL HIGH (ref 70–99)

## 2012-07-06 LAB — PREPARE PLATELET PHERESIS: Unit division: 0

## 2012-07-06 LAB — PHOSPHORUS: Phosphorus: 2.3 mg/dL (ref 2.3–4.6)

## 2012-07-06 LAB — CALCIUM, IONIZED: Calcium, Ion: 1.13 mmol/L (ref 1.12–1.32)

## 2012-07-06 MED ORDER — POTASSIUM CHLORIDE 10 MEQ/50ML IV SOLN
INTRAVENOUS | Status: AC
  Administered 2012-07-06: 10 meq via INTRAVENOUS
  Filled 2012-07-06: qty 150

## 2012-07-06 MED ORDER — POTASSIUM CHLORIDE 10 MEQ/50ML IV SOLN
10.0000 meq | INTRAVENOUS | Status: AC
  Administered 2012-07-06 (×3): 10 meq via INTRAVENOUS

## 2012-07-06 MED ORDER — SODIUM CHLORIDE 0.9 % IV SOLN
500.0000 mL | Freq: Once | INTRAVENOUS | Status: AC
  Administered 2012-07-06: 500 mL via INTRAVENOUS
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
## 2012-07-06 NOTE — Progress Notes (Signed)
Subjective: 1 Day Post-Op Procedure(s) (LRB): IRRIGATION AND DEBRIDEMENT EXTREMITY (Bilateral) APPLICATION OF WOUND VAC (Bilateral) REMOVAL EXTERNAL FIXATION LEG (Bilateral) OPEN REDUCTION INTERNAL FIXATION (ORIF) TIBIA FRACTURE (Bilateral) INTRAMEDULLARY (IM) NAIL FEMORAL (Bilateral) Patient reports pain as unable due to intubation.    Objective: Vital signs in last 24 hours: Temp:  [91.9 F (33.3 C)-100.6 F (38.1 C)] 100.6 F (38.1 C) (08/10 1144) Pulse Rate:  [59-103] 101  (08/10 1144) Resp:  [11-15] 12  (08/10 1144) BP: (98-161)/(35-81) 108/48 mmHg (08/10 1144) SpO2:  [100 %] 100 % (08/10 1144) Arterial Line BP: (95-162)/(41-89) 114/47 mmHg (08/10 0900) FiO2 (%):  [40 %] 40 % (08/10 1146) Weight:  [107.9 kg (237 lb 14 oz)] 107.9 kg (237 lb 14 oz) (08/10 0200)  Intake/Output from previous day: 08/09 0701 - 08/10 0700 In: 40981 [I.V.:5136; XBJYN:8295; NG/GT:30; IV Piggyback:400] Out: 1860 [Urine:960; Emesis/NG output:100; Drains:50; Blood:750] Intake/Output this shift: Total I/O In: 917 [I.V.:917] Out: 65 [Urine:65]   Basename 07/06/12 0338 07/05/12 1826 07/05/12 1430 07/05/12 0440 07/04/12 0500  HGB 9.6* 10.3* 8.3* 7.5* 8.4*    Basename 07/06/12 0338 07/05/12 1826  WBC 6.3 6.6  RBC 3.16* 3.41*  HCT 26.6* 28.7*  PLT 74* 74*    Basename 07/06/12 0338 07/05/12 0440  NA 138 140  K 3.4* 3.4*  CL 105 106  CO2 27 26  BUN 13 16  CREATININE 0.62 0.78  GLUCOSE 137* 175*  CALCIUM 7.6* 7.3*    Basename 07/05/12 1826 07/05/12 1539  LABPT -- --  INR 1.23 1.48    exam of bilateral lower extremities show all dressings in place with minimal drainage. VAC in place and active right lower extremity.  Assessment/Plan: 1 Day Post-Op Procedure(s) (LRB): IRRIGATION AND DEBRIDEMENT EXTREMITY (Bilateral) APPLICATION OF WOUND VAC (Bilateral) REMOVAL EXTERNAL FIXATION LEG (Bilateral) OPEN REDUCTION INTERNAL FIXATION (ORIF) TIBIA FRACTURE (Bilateral) INTRAMEDULLARY (IM)  NAIL FEMORAL (Bilateral) Continue ABX therapy due to Post-op infection risk Patient will continue to be followed for multiple comorbidities Ortho is stable post op contunue VAC right lower extremity ROBERTS,JANE B 07/06/2012, 1:31 PM
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
## 2012-07-07 ENCOUNTER — Inpatient Hospital Stay (HOSPITAL_COMMUNITY): Payer: Medicare Other

## 2012-07-07 DIAGNOSIS — J9601 Acute respiratory failure with hypoxia: Secondary | ICD-10-CM | POA: Diagnosis present

## 2012-07-07 LAB — TYPE AND SCREEN
Donor AG Type: NEGATIVE
Donor AG Type: NEGATIVE
Donor AG Type: NEGATIVE
Unit division: 0
Unit division: 0
Unit division: 0
Unit division: 0
Unit division: 0
Unit division: 0

## 2012-07-07 LAB — CBC WITH DIFFERENTIAL/PLATELET
Basophils Absolute: 0 10*3/uL (ref 0.0–0.1)
Basophils Relative: 0 % (ref 0–1)
Eosinophils Absolute: 0.1 10*3/uL (ref 0.0–0.7)
HCT: 25.2 % — ABNORMAL LOW (ref 36.0–46.0)
Hemoglobin: 8.9 g/dL — ABNORMAL LOW (ref 12.0–15.0)
MCH: 30.5 pg (ref 26.0–34.0)
MCHC: 35.3 g/dL (ref 30.0–36.0)
Monocytes Absolute: 0.7 10*3/uL (ref 0.1–1.0)
Monocytes Relative: 9 % (ref 3–12)
RDW: 14.7 % (ref 11.5–15.5)

## 2012-07-07 LAB — BASIC METABOLIC PANEL
BUN: 12 mg/dL (ref 6–23)
Calcium: 7.1 mg/dL — ABNORMAL LOW (ref 8.4–10.5)
Creatinine, Ser: 0.64 mg/dL (ref 0.50–1.10)
GFR calc Af Amer: >90 mL/min (ref >90)
GFR calc non Af Amer: 81 mL/min — ABNORMAL LOW (ref >90)

## 2012-07-07 MED ORDER — JEVITY 1.2 CAL PO LIQD
1000.0000 mL | ORAL | Status: DC
  Administered 2012-07-07: 18:00:00
  Filled 2012-07-07 (×3): qty 1000

## 2012-07-07 MED ORDER — FENTANYL BOLUS VIA INFUSION
50.0000 ug | INTRAVENOUS | Status: DC | PRN
  Filled 2012-07-07: qty 200

## 2012-07-07 MED ORDER — FENTANYL CITRATE 0.05 MG/ML IJ SOLN
50.0000 ug | INTRAMUSCULAR | Status: DC | PRN
  Administered 2012-07-08 – 2012-07-11 (×21): 100 ug via INTRAVENOUS
  Filled 2012-07-07 (×23): qty 2

## 2012-07-07 NOTE — Progress Notes (Signed)
An estimated 50 ml's of fentanyl and 5 ml's of versed wasted in sink.  Witnessed by Biagio Borg RN
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
## 2012-07-08 ENCOUNTER — Encounter (HOSPITAL_COMMUNITY): Payer: Self-pay | Admitting: Orthopedic Surgery

## 2012-07-08 ENCOUNTER — Inpatient Hospital Stay (HOSPITAL_COMMUNITY): Payer: Medicare Other

## 2012-07-08 LAB — CBC WITH DIFFERENTIAL/PLATELET
Basophils Relative: 0 % (ref 0–1)
Eosinophils Absolute: 0.2 10*3/uL (ref 0.0–0.7)
Eosinophils Relative: 2 % (ref 0–5)
HCT: 25.2 % — ABNORMAL LOW (ref 36.0–46.0)
Hemoglobin: 8.9 g/dL — ABNORMAL LOW (ref 12.0–15.0)
MCH: 30.6 pg (ref 26.0–34.0)
MCHC: 35.3 g/dL (ref 30.0–36.0)
MCV: 86.6 fL (ref 78.0–100.0)
Monocytes Absolute: 1.1 10*3/uL — ABNORMAL HIGH (ref 0.1–1.0)
Monocytes Relative: 11 % (ref 3–12)
Neutro Abs: 7.4 10*3/uL (ref 1.7–7.7)

## 2012-07-08 LAB — BASIC METABOLIC PANEL
BUN: 12 mg/dL (ref 6–23)
Calcium: 7.4 mg/dL — ABNORMAL LOW (ref 8.4–10.5)
Chloride: 104 mEq/L (ref 96–112)
Creatinine, Ser: 0.55 mg/dL (ref 0.50–1.10)
GFR calc Af Amer: >90 mL/min (ref >90)
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
## 2012-07-08 NOTE — Plan of Care (Signed)
Problem: Phase I Progression Outcomes Goal: Progressing towards optiumm acitivities Outcome: Not Met (add Reason) NWB BLE  Problem: Phase II Progression Outcomes Goal: Time pt extubated/weaned off vent Outcome: Completed/Met Date Met:  07/08/12 07/08/12 between 0700 and 1800

## 2012-07-08 NOTE — Plan of Care (Signed)
Problem: Phase II Progression Outcomes Goal: Date pt extubated/weaned off vent Outcome: Completed/Met Date Met:  07/08/12 07/08/12

## 2012-07-08 NOTE — Progress Notes (Signed)
Started wean on 10/5 due to patients rass score, tolerating wean well at this time.
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
## 2012-07-08 NOTE — Progress Notes (Signed)
Nutrition Follow-up  Intervention:    If EN re-started, recommend Pivot 1.5 (immune modulating formula) -- initiate at 15 ml/hr and increase by 10 ml every 4 hours to goal rate of 55 ml/hr to provide 1980 kcals, 124 gm protein, 1002 ml of free water RD to follow for nutrition care plan  Assessment:   S/p several procedures 8/9: IRRIGATION AND DEBRIDEMENT EXTREMITY (Bilateral)  APPLICATION OF WOUND VAC (Bilateral)  REMOVAL EXTERNAL FIXATION LEG (Bilateral)  OPEN REDUCTION INTERNAL FIXATION (ORIF) TIBIA FRACTURE (Bilateral)  INTRAMEDULLARY (IM) NAIL FEMORAL (Bilateral)  Jevity 1.2 formula initiated 8/11 at 10 ml/hr via OGT -- held for extubation this AM. VAC change tomorrow. Noted may need amputation of left lower extremity.   Diet Order:  NPO  Meds: Scheduled Meds:   . antiseptic oral rinse  15 mL Mouth Rinse QID  .  ceFAZolin (ANCEF) IV  2 g Intravenous Q8H  . chlorhexidine  15 mL Mouth Rinse BID  . feeding supplement (JEVITY 1.2 CAL)  1,000 mL Per Tube Q24H  . gentamicin  100 mg Intravenous Once  . pantoprazole  40 mg Oral Q1200   Or  . pantoprazole (PROTONIX) IV  40 mg Intravenous Q1200  . TDaP  0.5 mL Intramuscular Once   Continuous Infusions:   . dextrose 5 % and 0.45 % NaCl with KCl 20 mEq/L 125 mL/hr at 07/08/12 0306   PRN Meds:.fentaNYL, ondansetron (ZOFRAN) IV, ondansetron  Labs:  CMP     Component Value Date/Time   NA 136 07/08/2012 1000   K 3.6 07/08/2012 1000   CL 104 07/08/2012 1000   CO2 26 07/08/2012 1000   GLUCOSE 153* 07/08/2012 1000   BUN 12 07/08/2012 1000   CREATININE 0.55 07/08/2012 1000   CALCIUM 7.4* 07/08/2012 1000   PROT 4.6* 07/04/2012 0500   ALBUMIN 2.7* 07/04/2012 0500   AST 75* 07/04/2012 0500   ALT 31 07/04/2012 0500   ALKPHOS 46 07/04/2012 0500   BILITOT 0.8 07/04/2012 0500   GFRNONAA 86* 07/08/2012 1000   GFRAA >90 07/08/2012 1000     Intake/Output Summary (Last 24 hours) at 07/08/12 1618 Last data filed at 07/08/12 1600  Gross per 24 hour    Intake   3310 ml  Output   3765 ml  Net   -455 ml    CBG (last 3)   Basename 07/07/12 1954 07/07/12 1610 07/06/12 1953  GLUCAP 151* 133* 117*    Weight Status:  111.9 kg (8/11) -- trending up  Re-estimated needs:  1900-2100 kcals, 115-125 gm protein  Nutrition Dx:  Inadequate Oral Intake r/t inability to eat as evidenced by NPO status, ongoing  New Goal:  Oral diet vs EN support to meet >/= 90% of estimated nutrition needs, currently unmet  Monitor:  PO diet advancement, EN regimen, weight, labs, I/O's  Kirkland Hun, RD, LDN Pager #: [phone] After-Hours Pager #: [phone]

## 2012-07-08 NOTE — Procedures (Signed)
Extubation Procedure Note  Patient Details:   Name: Jill Bowman DOB: 1929/06/05 MRN: [id number]   Airway Documentation:  Airway 7.5 mm (Active)  Secured at (cm) 23 cm 07/08/2012  7:35 AM  Measured From Lips 07/08/2012  7:35 AM  Secured Location Left 07/08/2012  7:35 AM  Secured By [employer] 07/08/2012  7:35 AM  Tube Holder Repositioned Yes 07/08/2012  7:35 AM  Cuff Pressure (cm H2O) 24 cm H2O 07/08/2012  7:35 AM  Site Condition Dry 07/08/2012  7:35 AM    Evaluation  O2 sats: stable throughout Complications: No apparent complications Patient did tolerate procedure well. Bilateral Breath Sounds: Clear Suctioning: Airway Yes  Jill Bowman 07/08/2012, 10:02 AM

## 2012-07-08 NOTE — Progress Notes (Signed)
PT Cancellation Note  Treatment cancelled today due to medical issues with patient which prohibited therapy, per orders, pt's spine not cleared and no OOB orders.  Will check back tomorrow.  Anas Reister, [place] 07/08/2012, 3:33 PM

## 2012-07-09 LAB — HEMOGLOBIN AND HEMATOCRIT, BLOOD: HCT: 26 % — ABNORMAL LOW (ref 36.0–46.0)

## 2012-07-09 MED ORDER — ENOXAPARIN SODIUM 40 MG/0.4ML ~~LOC~~ SOLN
40.0000 mg | SUBCUTANEOUS | Status: DC
  Administered 2012-07-09 – 2012-07-12 (×4): 40 mg via SUBCUTANEOUS
  Filled 2012-07-09 (×4): qty 0.4

## 2012-07-09 MED ORDER — METHOCARBAMOL 100 MG/ML IJ SOLN
1000.0000 mg | Freq: Four times a day (QID) | INTRAVENOUS | Status: DC | PRN
  Administered 2012-07-09 – 2012-07-11 (×3): 1000 mg via INTRAVENOUS
  Filled 2012-07-09 (×4): qty 10

## 2012-07-09 MED ORDER — BIOTENE DRY MOUTH MT LIQD
15.0000 mL | Freq: Two times a day (BID) | OROMUCOSAL | Status: DC
  Administered 2012-07-09 – 2012-07-17 (×12): 15 mL via OROMUCOSAL
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
## 2012-07-09 NOTE — Progress Notes (Signed)
UR completed.  PT/OT evals recommending SNF due to weight-bearing status. CSW aware of need for SNF.

## 2012-07-10 LAB — BASIC METABOLIC PANEL
CO2: 26 mEq/L (ref 19–32)
Calcium: 7.8 mg/dL — ABNORMAL LOW (ref 8.4–10.5)
Creatinine, Ser: 0.56 mg/dL (ref 0.50–1.10)
GFR calc non Af Amer: 85 mL/min — ABNORMAL LOW (ref >90)
Glucose, Bld: 130 mg/dL — ABNORMAL HIGH (ref 70–99)
Potassium: 3.6 mEq/L (ref 3.5–5.1)
Sodium: 138 mEq/L (ref 135–145)

## 2012-07-10 LAB — CULTURE, RESPIRATORY W GRAM STAIN: Special Requests: NORMAL

## 2012-07-10 LAB — POCT I-STAT 7, (LYTES, BLD GAS, ICA,H+H)
Bicarbonate: 26.2 mEq/L — ABNORMAL HIGH (ref 20.0–24.0)
Calcium, Ion: 1.02 mmol/L — ABNORMAL LOW (ref 1.13–1.30)
Calcium, Ion: 1.15 mmol/L (ref 1.13–1.30)
HCT: 20 % — ABNORMAL LOW (ref 36.0–46.0)
HCT: 25 % — ABNORMAL LOW (ref 36.0–46.0)
Hemoglobin: 6.8 g/dL — CL (ref 12.0–15.0)
O2 Saturation: 100 %
O2 Saturation: 100 %
Patient temperature: 34.3
Potassium: 3.4 mEq/L — ABNORMAL LOW (ref 3.5–5.1)
Potassium: 3.3 mEq/L — ABNORMAL LOW (ref 3.5–5.1)
Sodium: 141 mEq/L (ref 135–145)
Sodium: 141 mEq/L (ref 135–145)
TCO2: 27 mmol/L (ref 0–100)
pH, Arterial: 7.433 (ref 7.350–7.450)
pH, Arterial: 7.426 (ref 7.350–7.450)
pO2, Arterial: 284 mmHg — ABNORMAL HIGH (ref 80.0–100.0)

## 2012-07-10 LAB — POCT I-STAT GLUCOSE
Glucose, Bld: 136 mg/dL — ABNORMAL HIGH (ref 70–99)
Operator id: 305741

## 2012-07-10 LAB — CBC
HCT: 25.3 % — ABNORMAL LOW (ref 36.0–46.0)
Hemoglobin: 8.7 g/dL — ABNORMAL LOW (ref 12.0–15.0)
RDW: 14.3 % (ref 11.5–15.5)
WBC: 8.3 10*3/uL (ref 4.0–10.5)

## 2012-07-10 MED ORDER — BOOST / RESOURCE BREEZE PO LIQD
1.0000 | Freq: Three times a day (TID) | ORAL | Status: DC
  Administered 2012-07-10 – 2012-07-18 (×19): 1 via ORAL

## 2012-07-10 MED ORDER — AMIODARONE HCL 100 MG PO TABS
100.0000 mg | ORAL_TABLET | Freq: Every day | ORAL | Status: DC
  Administered 2012-07-10 – 2012-07-18 (×9): 100 mg via ORAL
  Filled 2012-07-10 (×9): qty 1

## 2012-07-10 MED ORDER — POTASSIUM CHLORIDE CRYS ER 20 MEQ PO TBCR
20.0000 meq | EXTENDED_RELEASE_TABLET | Freq: Two times a day (BID) | ORAL | Status: DC
  Administered 2012-07-10 – 2012-07-11 (×2): 20 meq via ORAL
  Filled 2012-07-10 (×3): qty 1

## 2012-07-10 MED ORDER — BISACODYL 5 MG PO TBEC
10.0000 mg | DELAYED_RELEASE_TABLET | Freq: Two times a day (BID) | ORAL | Status: DC
  Administered 2012-07-11 – 2012-07-14 (×4): 10 mg via ORAL
  Filled 2012-07-10: qty 1
  Filled 2012-07-10 (×3): qty 2

## 2012-07-10 MED ORDER — CEFTRIAXONE SODIUM 1 G IJ SOLR
1.0000 g | INTRAMUSCULAR | Status: AC
  Administered 2012-07-10 – 2012-07-14 (×5): 1 g via INTRAVENOUS
  Filled 2012-07-10 (×6): qty 10

## 2012-07-10 MED ORDER — DOCUSATE SODIUM 100 MG PO CAPS
200.0000 mg | ORAL_CAPSULE | Freq: Two times a day (BID) | ORAL | Status: DC
  Administered 2012-07-11 – 2012-07-14 (×4): 200 mg via ORAL
  Filled 2012-07-10 (×7): qty 2

## 2012-07-10 MED ORDER — PRO-STAT SUGAR FREE PO LIQD
30.0000 mL | Freq: Three times a day (TID) | ORAL | Status: DC
  Administered 2012-07-10 – 2012-07-18 (×18): 30 mL via ORAL
  Filled 2012-07-10 (×26): qty 30

## 2012-07-10 MED ORDER — FUROSEMIDE 10 MG/ML IJ SOLN
20.0000 mg | Freq: Two times a day (BID) | INTRAMUSCULAR | Status: DC
  Administered 2012-07-10 (×2): 20 mg via INTRAVENOUS
  Filled 2012-07-10 (×3): qty 2
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
## 2012-07-10 NOTE — Progress Notes (Signed)
Call Md on-call (Dr. Magnus Ivan) to make aware that pt is currently in afib in a controlled rate of 90. Pt BP stable at this time, only pain pt complains of is pain in the right foot. MD ordered to start pt back on their home dose of PO amiodarone. Will cont to monitor pt.

## 2012-07-10 NOTE — Progress Notes (Addendum)
Trauma Service Note  Subjective: The patient is conversant, but seems incredibly tired  Objective: Vital signs in last 24 hours: Temp:  [97.5 F (36.4 C)-99.1 F (37.3 C)] 97.5 F (36.4 C) (08/14 0752) Pulse Rate:  [79-97] 79  (08/14 0800) Resp:  [8-25] 22  (08/14 0800) BP: (116-150)/(40-65) 142/45 mmHg (08/14 0800) SpO2:  [92 %-100 %] 98 % (08/14 0800) Arterial Line BP: (158)/(54) 158/54 mmHg (08/13 0900)    Intake/Output from previous day: 08/13 0701 - 08/14 0700 In: 3508 [P.O.:360; I.V.:2938; IV Piggyback:210] Out: 3080 [Urine:3080] Intake/Output this shift: Total I/O In: 125 [I.V.:125] Out: -   General: No acute distress, just seems worn out  Lungs: Clear  Abd: Soft, good bowel sounds  Extremities: No significant changes from before.  See orthopedic service note.  Patient is very weak in upper extremities, cannot feed herself.   Can move her toes   Neuro: Weak in upper extremities.  Right arm is weaker than left.  Question of central cord although the patient has no neck pain or fractures.  Will consider g etting MRI of C-spine.  Lab Results: CBC   Basename 07/10/12 0429 07/09/12 1610 07/08/12 1000  WBC 8.3 -- 9.4  HGB 8.7* 9.1* --  HCT 25.3* 26.0* --  PLT 170 -- 111*   BMET  Basename 07/10/12 0429 07/08/12 1000  NA 138 136  K 3.6 3.6  CL 104 104  CO2 26 26  GLUCOSE 130* 153*  BUN 13 12  CREATININE 0.56 0.55  CALCIUM 7.8* 7.4*   PT/INR No results found for this basename: LABPROT:2,INR:2 in the last 72 hours ABG No results found for this basename: PHART:2,PCO2:2,PO2:2,HCO3:2 in the last 72 hours  Studies/Results: Dg Chest Port 1 View  07/08/2012  *RADIOLOGY REPORT*  Clinical Data: Cough.  PORTABLE CHEST - 1 VIEW  Comparison: 07/07/2012  Findings: Bilateral airspace opacities and layering effusions compatible with pulmonary edema/CHF.  Heart is borderline enlarged. Interval removal of endotracheal tube.  Otherwise support devices are unchanged.   Effusions are likely increased.  Bibasilar atelectasis.  IMPRESSION: Mild pulmonary edema/CHF.  Increasing bibasilar atelectasis and enlarging effusions.  Original Report Authenticated By: Cyndie Chime, M.D.    Anti-infectives: Anti-infectives     Start     Dose/Rate Route Frequency Ordered Stop   07/05/12 1117   vancomycin (VANCOCIN) powder  Status:  Discontinued          As needed 07/05/12 1117 07/05/12 1809   07/05/12 1116   tobramycin (NEBCIN) powder  Status:  Discontinued          As needed 07/05/12 1117 07/05/12 1809   07/03/12 2300   ceFAZolin (ANCEF) IVPB 2 g/50 mL premix        2 g 100 mL/hr over 30 Minutes Intravenous 3 times per day 07/03/12 1731     07/03/12 1930   tobramycin (NEBCIN) powder 1.2 g        1.2 g Topical To Surgery 07/03/12 1917 07/03/12 2135   07/03/12 1930   vancomycin (VANCOCIN) powder 1,000 mg        1,000 mg Other To Surgery 07/03/12 1917 07/03/12 2136   07/03/12 1745   gentamicin (GARAMYCIN) IVPB 100 mg        100 mg 200 mL/hr over 30 Minutes Intravenous  Once 07/03/12 1733     07/03/12 1630   ceFAZolin (ANCEF) IVPB 1 g/50 mL premix        1 g 100 mL/hr over 30 Minutes Intravenous STAT  07/03/12 1629 07/03/12 1700   07/03/12 1628   ceFAZolin (ANCEF) 1,000 mg in dextrose 5 % 50 mL IVPB        1,000 mg over 30 Minutes Intravenous As needed 07/03/12 1629 07/03/12 1628   07/03/12 1615   ceFAZolin (ANCEF) IVPB 2 g/50 mL premix        2 g 100 mL/hr over 30 Minutes Intravenous STAT 07/03/12 1611 07/03/12 1700          Assessment/Plan: s/p Procedure(s): IRRIGATION AND DEBRIDEMENT EXTREMITY APPLICATION OF WOUND VAC REMOVAL EXTERNAL FIXATION LEG OPEN REDUCTION INTERNAL FIXATION (ORIF) TIBIA FRACTURE INTRAMEDULLARY (IM) NAIL FEMORAL Advance diet Continue ABX therapy due to Post-op infection Decrease IVFs Consider getting MRI, however this would be difficult because she does nave pacemaker in place.  If central cord diagnosed, not likely to  change current management  LOS: 7 days   Marta Lamas. Gae Bon, MD, FACS [phone] Trauma Surgeon 07/10/2012
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
## 2012-07-10 NOTE — Clinical Social Work Placement (Addendum)
    Clinical Social Work Department CLINICAL SOCIAL WORK PLACEMENT NOTE 07/10/2012  Patient:  GRIZEL, VESELY  Account Number:  [id number] Admit date:  07/03/2012  Clinical Social Worker:  Macario Golds, Theresia Majors  Date/time:  07/10/2012 11:15 AM  Clinical Social Work is seeking post-discharge placement for this patient at the following level of care:   SKILLED NURSING   (*CSW will update this form in Epic as items are completed)   07/10/2012  Patient/family provided with [facility] Department of Clinical Social Work's list of facilities offering this level of care within the geographic area requested by the patient (or if unable, by the patient's family).  07/10/2012  Patient/family informed of their freedom to choose among providers that offer the needed level of care, that participate in Medicare, Medicaid or managed care program needed by the patient, have an available bed and are willing to accept the patient.  07/10/2012  Patient/family informed of [facility]' ownership interest in [facility], as well as of the fact that they are under no obligation to receive care at this facility.  PASARR submitted to EDS on 07/10/2012 PASARR number received from EDS on 07/10/2012  FL2 transmitted to all facilities in geographic area requested by pt/family on  07/10/2012 FL2 transmitted to all facilities within larger geographic area on   Patient informed that his/her managed care company has contracts with or will negotiate with  certain facilities, including the following:     Patient/family informed of bed offers received:  07/12/2012 Patient chooses bed at [facility] Physician recommends and patient chooses bed at    Patient to be transferred to [facility] on  07/18/2012 Patient to be transferred to facility by Ambulance  The following physician request were entered in Epic:   Additional Comments:  08/22 - Patient able to go to same facility as  daughter at discharge

## 2012-07-11 LAB — BASIC METABOLIC PANEL
BUN: 17 mg/dL (ref 6–23)
Chloride: 102 mEq/L (ref 96–112)
GFR calc Af Amer: >90 mL/min (ref >90)
Glucose, Bld: 125 mg/dL — ABNORMAL HIGH (ref 70–99)
Potassium: 3.3 mEq/L — ABNORMAL LOW (ref 3.5–5.1)

## 2012-07-11 LAB — CBC
HCT: 26.2 % — ABNORMAL LOW (ref 36.0–46.0)
Hemoglobin: 9.1 g/dL — ABNORMAL LOW (ref 12.0–15.0)
MCHC: 34.7 g/dL (ref 30.0–36.0)

## 2012-07-11 MED ORDER — POTASSIUM CHLORIDE 20 MEQ/15ML (10%) PO LIQD
20.0000 meq | Freq: Two times a day (BID) | ORAL | Status: AC
  Administered 2012-07-11 (×2): 20 meq via ORAL
  Filled 2012-07-11 (×2): qty 15

## 2012-07-11 MED ORDER — FUROSEMIDE 20 MG PO TABS
20.0000 mg | ORAL_TABLET | Freq: Every day | ORAL | Status: DC
  Administered 2012-07-11 – 2012-07-18 (×8): 20 mg via ORAL
  Filled 2012-07-11 (×8): qty 1

## 2012-07-11 MED ORDER — POTASSIUM CHLORIDE CRYS ER 20 MEQ PO TBCR: 20.0000 meq | EXTENDED_RELEASE_TABLET | Freq: Two times a day (BID) | ORAL | Status: DC

## 2012-07-11 MED ORDER — FENTANYL CITRATE 0.05 MG/ML IJ SOLN
50.0000 ug | INTRAMUSCULAR | Status: DC | PRN
  Administered 2012-07-11 – 2012-07-12 (×6): 100 ug via INTRAVENOUS
  Filled 2012-07-11: qty 2
  Filled 2012-07-11: qty 4
  Filled 2012-07-11 (×3): qty 2

## 2012-07-11 MED ORDER — FUROSEMIDE 40 MG PO TABS
60.0000 mg | ORAL_TABLET | Freq: Every day | ORAL | Status: DC
  Filled 2012-07-11: qty 1

## 2012-07-11 NOTE — Progress Notes (Signed)
Occupational Therapy Treatment Patient Details Name: Artrice Kraker MRN: [id number] DOB: 11-26-1929 Today's Date: 07/11/2012 Time: 4098-1191 OT Time Calculation (min): 22 min  OT Assessment / Plan / Recommendation Comments on Treatment Session This patient is making progress since eval 2 days ago. More talkative, moving arms more, and initiating movements with UEs when asked    Follow Up Recommendations  Skilled nursing facility    Barriers to Discharge       Equipment Recommendations  Defer to next venue    Recommendations for Other Services    Frequency Min 2X/week   Plan Discharge plan remains appropriate    Precautions / Restrictions Precautions Precautions: Fall Precaution Comments: Multiple lines, 2 wound vacs, external fixator on RLE Restrictions Weight Bearing Restrictions: Yes RLE Weight Bearing: Non weight bearing LLE Weight Bearing: Non weight bearing   Pertinent Vitals/Pain 8/10 RLE    ADL       OT Diagnosis:    OT Problem List:   OT Treatment Interventions:     OT Goals Arm Goals Arm Goal: AROM - Progress: Progressing toward goal Miscellaneous OT Goals OT Goal: Miscellaneous Goal #2 - Progress: Progressing toward goals  Visit Information  Last OT Received On: 07/11/12 Assistance Needed: +3 or more PT/OT Co-Evaluation/Treatment: Yes    Subjective Data  Subjective: My legs hurt   Prior Functioning       Cognition  Overall Cognitive Status: Impaired Area of Impairment: Following commands Arousal/Alertness: Awake/alert Behavior During Session: [facility] for tasks performed Following Commands: Follows one step commands with increased time    Mobility Bed Mobility Bed Mobility: Rolling Right;Rolling Left Rolling Right: 1: +2 Total assist (+3 assist to position lift pad; pt initiated with UEs) Rolling Right: Patient Percentage: 10% Rolling Left: 1: +2 Total assist (+3 assist to position lift pad; pt initiated with UEs) Rolling Left: Patient  Percentage: 10% Transfers Details for Transfer Assistance: Used Maxi Sky to get pt from bed to recliner to see if there were any issues that we needed to make nursing aware of, transfer went smooth without need for modifications.   Exercises Other Exercises Other Exercises: Pt with AROM of all joints Bil UEs today. RUE still edematous, LUE edema has greatly decreased. Pt able to hold her LUE with RUE across her chest and able to reach up and grab bar for [facility]. Pt is more limited in  RUE due to edema  Balance    End of Session OT - End of Session Equipment Utilized During Treatment:  (Maxi Sky) Activity Tolerance: Patient tolerated treatment well;Patient limited by pain Patient left: in chair (sitter in room) Nurse Communication: Need for lift equipment (maxi sky, need to put straps back under her legs)       Evette Georges [phone] 07/11/2012, 1:35 PM
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
## 2012-07-11 NOTE — Progress Notes (Signed)
Physical Therapy Treatment Patient Details Name: Jill Bowman MRN: [id number] DOB: Jun 04, 1929 Today's Date: 07/11/2012 Time: 4098-1191 PT Time Calculation (min): 33 min  PT Assessment / Plan / Recommendation Comments on Treatment Session  Utilized maxisky to transfer pt from bed>recliner today.  Pt cont's to require +3 for mobilitym  But per OT, pt with increased participation today compared to last therapy session    Follow Up Recommendations  Skilled nursing facility    Barriers to Discharge        Equipment Recommendations  Defer to next venue    Recommendations for Other Services    Frequency Min 3X/week   Plan Discharge plan remains appropriate    Precautions / Restrictions Precautions Precautions: Fall Precaution Comments: Multiple lines, 2 wound vacs, external fixator on RLE Restrictions Weight Bearing Restrictions: Yes RLE Weight Bearing: Non weight bearing LLE Weight Bearing: Non weight bearing    Pertinent Vitals/Pain Pt c/o pain "all over".  RN administered pain medication at beginning of session.      Mobility  Bed Mobility Bed Mobility: Rolling Right;Rolling Left Rolling Right: 1: +2 Total assist Rolling Right: Patient Percentage: 10% Rolling Left: 1: +2 Total assist Rolling Left: Patient Percentage: 10% Details for Bed Mobility Assistance: Pt able to initiate rolling but required +3 total assist to carryout transition.  Max cueing for technique.  Pt c/o discomfort & pain with movement.  Use of draw pad to assist with achieving full sidelying position.   Transfers Transfers:  (used lift) Transfer via Scientist, water quality Details for Transfer Assistance: Used Maxi Sky to get pt from bed to recliner to see if there were any issues that we needed to make nursing aware of, transfer went smooth without need for modifications.    Exercises Other Exercises Other Exercises: Pt with AROM of all joints Bil UEs today. RUE still edematous, LUE edema has greatly  decreased. Pt able to hold her LUE with RUE across her chest and able to reach up and grab bar for [facility]. Pt is more limited in  RUE due to edema     PT Goals Acute Rehab PT Goals Time For Goal Achievement: 07/23/12 Potential to Achieve Goals: Good PT Goal: Rolling Supine to Right Side - Progress: Progressing toward goal PT Goal: Rolling Supine to Left Side - Progress: Progressing toward goal  Visit Information  Last PT Received On: 07/11/12 Assistance Needed: +3 or more    Subjective Data      Cognition  Overall Cognitive Status: Impaired Area of Impairment: Following commands Arousal/Alertness: Awake/alert Behavior During Session: [facility] for tasks performed Following Commands: Follows one step commands with increased time    Balance     End of Session PT - End of Session Equipment Utilized During Treatment: Other (comment) Herma Mering) Patient left: in chair;with nursing in room Nurse Communication: Need for lift equipment     Verdell Face, [place] [phone] 07/11/2012

## 2012-07-11 NOTE — Progress Notes (Signed)
Pt discussed at hospital LOS meeting this am.  

## 2012-07-11 NOTE — Clinical Social Work Note (Signed)
Clinical Social Worker spoke with patient 2 daughters and a son at bedside to further discuss patient plans at discharge.  Patient family understanding and agreeable to SNF placement once patient is medically ready for discharge.  CSW explained the process to family and will provide possible bed offers tomorrow.  Patient family dynamics seem to be a concern regarding location and facility choice.  CSW encouraged family to communicate and be open with one another regarding decisions and to keep patient involved on decision making.  Patient family understands that patient is the ultimate decision maker at this point.  Patient family to further discuss their options and communicate with family on 5N to determine the best placement for both patients.    Clinical Social Worker will follow up with patient and patient family to arrange and facilitate patient discharge needs once medically stable.  Macario Golds, [place] [phone]

## 2012-07-12 LAB — CBC
HCT: 26.2 % — ABNORMAL LOW (ref 36.0–46.0)
Hemoglobin: 8.9 g/dL — ABNORMAL LOW (ref 12.0–15.0)
MCH: 29.9 pg (ref 26.0–34.0)
MCV: 87.9 fL (ref 78.0–100.0)
Platelets: 252 10*3/uL (ref 150–400)
RBC: 2.98 MIL/uL — ABNORMAL LOW (ref 3.87–5.11)
WBC: 10.1 10*3/uL (ref 4.0–10.5)

## 2012-07-12 LAB — GLUCOSE, CAPILLARY
Glucose-Capillary: 111 mg/dL — ABNORMAL HIGH (ref 70–99)
Glucose-Capillary: 115 mg/dL — ABNORMAL HIGH (ref 70–99)

## 2012-07-12 LAB — BASIC METABOLIC PANEL
BUN: 19 mg/dL (ref 6–23)
CO2: 25 mEq/L (ref 19–32)
Calcium: 7.9 mg/dL — ABNORMAL LOW (ref 8.4–10.5)
Chloride: 105 mEq/L (ref 96–112)
Creatinine, Ser: 0.62 mg/dL (ref 0.50–1.10)
Glucose, Bld: 109 mg/dL — ABNORMAL HIGH (ref 70–99)

## 2012-07-12 MED ORDER — HYDROCODONE-ACETAMINOPHEN 5-325 MG PO TABS
0.5000 | ORAL_TABLET | ORAL | Status: DC | PRN
  Administered 2012-07-12: 1 via ORAL
  Administered 2012-07-14: 2 via ORAL
  Administered 2012-07-15 (×2): 1 via ORAL
  Administered 2012-07-16: 2 via ORAL
  Filled 2012-07-12 (×2): qty 2
  Filled 2012-07-12 (×3): qty 1

## 2012-07-12 MED ORDER — FENTANYL CITRATE 0.05 MG/ML IJ SOLN: 50.0000 ug | INTRAMUSCULAR | Status: DC | PRN

## 2012-07-12 MED ORDER — WHITE PETROLATUM GEL
Status: AC
  Filled 2012-07-12: qty 5

## 2012-07-12 MED ORDER — ENOXAPARIN SODIUM 30 MG/0.3ML ~~LOC~~ SOLN
30.0000 mg | Freq: Two times a day (BID) | SUBCUTANEOUS | Status: DC
  Administered 2012-07-13 – 2012-07-18 (×11): 30 mg via SUBCUTANEOUS
  Filled 2012-07-12 (×12): qty 0.3

## 2012-07-12 MED ORDER — MAGIC MOUTHWASH
5.0000 mL | Freq: Two times a day (BID) | ORAL | Status: DC
  Administered 2012-07-12 – 2012-07-18 (×13): 5 mL via ORAL
  Filled 2012-07-12 (×14): qty 5

## 2012-07-12 MED ORDER — TRAMADOL HCL 50 MG PO TABS
100.0000 mg | ORAL_TABLET | Freq: Four times a day (QID) | ORAL | Status: DC
  Administered 2012-07-12 – 2012-07-18 (×23): 100 mg via ORAL
  Filled 2012-07-12 (×23): qty 2

## 2012-07-12 NOTE — Progress Notes (Signed)
UR completed 
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
## 2012-07-12 NOTE — Progress Notes (Signed)
Trauma Service Note  Subjective: The patient is conversant and alert.  No apparent delirium.  Has a sitter, but may not need one.  Objective: Vital signs in last 24 hours: Temp:  [97.9 F (36.6 C)-98.6 F (37 C)] 98.6 F (37 C) (08/16 0735) Pulse Rate:  [26-100] 82  (08/16 0700) Resp:  [17-24] 21  (08/16 0700) BP: (101-136)/(35-90) 117/35 mmHg (08/16 0700) SpO2:  [92 %-99 %] 93 % (08/16 0700) Last BM Date: 07/11/12  Intake/Output from previous day: 08/15 0701 - 08/16 0700 In: 629.5 [I.V.:519.5; IV Piggyback:110] Out: 2090 [Urine:2090] Intake/Output this shift:    General: No acute distress  Lungs: Clear to auscultation  Abd: Soft, good bowel sounds.  Having bowel  movements  Extremities: External fixator on the RLE.  All seem viable  Neuro: Completely intact  Lab Results: CBC   Basename 07/12/12 0400 07/11/12 0826  WBC 10.1 9.7  HGB 8.9* 9.1*  HCT 26.2* 26.2*  PLT 252 211   BMET  Basename 07/12/12 0400 07/11/12 0450  NA 139 137  K 3.5 3.3*  CL 105 102  CO2 25 28  GLUCOSE 109* 125*  BUN 19 17  CREATININE 0.62 0.60  CALCIUM 7.9* 7.9*   PT/INR No results found for this basename: LABPROT:2,INR:2 in the last 72 hours ABG No results found for this basename: PHART:2,PCO2:2,PO2:2,HCO3:2 in the last 72 hours  Studies/Results: No results found.  Anti-infectives: Anti-infectives     Start     Dose/Rate Route Frequency Ordered Stop   07/10/12 1200   cefTRIAXone (ROCEPHIN) 1 g in dextrose 5 % 50 mL IVPB        1 g 100 mL/hr over 30 Minutes Intravenous Every 24 hours 07/10/12 1059     07/05/12 1117   vancomycin (VANCOCIN) powder  Status:  Discontinued          As needed 07/05/12 1117 07/05/12 1809   07/05/12 1116   tobramycin (NEBCIN) powder  Status:  Discontinued          As needed 07/05/12 1117 07/05/12 1809   07/03/12 2300   ceFAZolin (ANCEF) IVPB 2 g/50 mL premix  Status:  Discontinued        2 g 100 mL/hr over 30 Minutes Intravenous 3 times per  day 07/03/12 1731 07/10/12 1058   07/03/12 1930   tobramycin (NEBCIN) powder 1.2 g        1.2 g Topical To Surgery 07/03/12 1917 07/03/12 2135   07/03/12 1930   vancomycin (VANCOCIN) powder 1,000 mg        1,000 mg Other To Surgery 07/03/12 1917 07/03/12 2136   07/03/12 1745   gentamicin (GARAMYCIN) IVPB 100 mg        100 mg 200 mL/hr over 30 Minutes Intravenous  Once 07/03/12 1733     07/03/12 1630   ceFAZolin (ANCEF) IVPB 1 g/50 mL premix        1 g 100 mL/hr over 30 Minutes Intravenous STAT 07/03/12 1629 07/03/12 1700   07/03/12 1628   ceFAZolin (ANCEF) 1,000 mg in dextrose 5 % 50 mL IVPB        1,000 mg over 30 Minutes Intravenous As needed 07/03/12 1629 07/03/12 1628   07/03/12 1615   ceFAZolin (ANCEF) IVPB 2 g/50 mL premix        2 g 100 mL/hr over 30 Minutes Intravenous STAT 07/03/12 1611 07/03/12 1700          Assessment/Plan: s/p Procedure(s): IRRIGATION AND DEBRIDEMENT EXTREMITY APPLICATION OF  WOUND VAC REMOVAL EXTERNAL FIXATION LEG OPEN REDUCTION INTERNAL FIXATION (ORIF) TIBIA FRACTURE INTRAMEDULLARY (IM) NAIL FEMORAL Advance diet Continue foley due to strict I&O, patient in ICU and urinary output monitoring Transfer to 3300 Remove R. IJ catheter which has been in place since admission.   LOS: 9 days   Marta Lamas. Gae Bon, MD, FACS [phone] Trauma Surgeon 07/12/2012

## 2012-07-12 NOTE — Clinical Social Work Note (Signed)
Clinical Social Worker continuing to follow patient for support and discharge planning needs.  Patient is sitting in bed alert and oriented with daughter at bedside.  Patient agreeable to provide daughter with bed offers for SNF placement.  Patient daughter plans to visit facilities and discuss with other siblings.  CSW prepared family for a discharge as early as Monday and be sure to have at least 2 facilities in mind.  Patient family agreeable and will have their decision by Monday.  Patient daughter states that they will communicate with patient daughter in 5N21 to be sure that all options are available for both patient's.  CSW reminded family that due to patient daughter insurance there is a strong possibility that they may not be placed at the same facility - patient family aware.  Clinical Social Worker will follow up with patient family to determine facility options and facilitate patient discharge needs once medically ready.  CSW remains available for support as needed.  Macario Golds, [place] [phone]
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
## 2012-07-14 LAB — GLUCOSE, CAPILLARY

## 2012-07-14 MED ORDER — WHITE PETROLATUM GEL
Status: AC
  Filled 2012-07-14: qty 5
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
## 2012-07-15 DIAGNOSIS — I4891 Unspecified atrial fibrillation: Secondary | ICD-10-CM

## 2012-07-15 DIAGNOSIS — J189 Pneumonia, unspecified organism: Secondary | ICD-10-CM
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
## 2012-07-15 NOTE — Progress Notes (Signed)
Met with patient and several family members this a.m.  Per MD- anticipate d/c to SNF tomorrow. Patient wants to be placed in a facility with her daughter- thus- CSW gave patient/family a list of the 3 facilities that offered on both patient and her daughter (permission obtained from her daughter to provide this).  Family is currently visiting these facilities and will notify CSW of their choice.  If patient agrees- will arrange d/c tomorrow if stable per MD.  Lupita Leash [name]  [phone]  (coverage for Trauma)

## 2012-07-15 NOTE — Progress Notes (Signed)
Occupational Therapy Treatment Patient Details Name: Jill Bowman MRN: [id number] DOB: 1929-08-06 Today's Date: 07/15/2012 Time: 1050-1120 OT Time Calculation (min): 30 min  OT Assessment / Plan / Recommendation Comments on Treatment Session PT tolerated use of lift well. Family assisting as needed. Pt motivated.    Follow Up Recommendations  Skilled nursing facility    Barriers to Discharge       Equipment Recommendations  Defer to next venue    Recommendations for Other Services    Frequency Min 2X/week   Plan Discharge plan remains appropriate    Precautions / Restrictions Precautions Precautions: Fall Precaution Comments: Multiple lines, 2 wound vacs, external fixator on RLE Restrictions Weight Bearing Restrictions: Yes RLE Weight Bearing: Non weight bearing LLE Weight Bearing: Non weight bearing   Pertinent Vitals/Pain 3    ADL  Grooming: Set up;Performed ADL Comments: Brushing teeth OOB    OT Diagnosis:    OT Problem List:   OT Treatment Interventions:     OT Goals Acute Rehab OT Goals OT Goal Formulation: With patient Time For Goal Achievement: 07/23/12 Potential to Achieve Goals: Good ADL Goals Pt Will Perform Grooming: with max assist;Supported;Supine, head of bed up ADL Goal: Grooming - Progress: Met Arm Goals Pt Will Perform AROM: Bilateral upper extremities;1 set;5 reps;to maintain range of motion;to decrease contractures Arm Goal: AROM - Progress: Met Pt Will Tolerate PROM: with caregiver independent in performing;to maintain range of motion;to decrease contracture;Bilateral upper extremities;1 set;10 reps Arm Goal: PROM - Progress: Met Miscellaneous OT Goals Miscellaneous OT Goal #1: Pt will tolerate sitting EOB for 20 minutes working on sitting balance OT Goal: Miscellaneous Goal #1 - Progress: Progressing toward goals Miscellaneous OT Goal #2: Pt will initiate rolling right and left with her arms. OT Goal: Miscellaneous Goal #2 - Progress:  Progressing toward goals  Visit Information  Last OT Received On: 07/15/12 Assistance Needed: +3 or more    Subjective Data      Prior Functioning       Cognition  Overall Cognitive Status: Appears within functional limits for tasks assessed/performed Area of Impairment: Following commands Arousal/Alertness: Awake/alert Behavior During Session: [facility] for tasks performed Following Commands: Follows multi-step commands consistently Cognition - Other Comments: PT does not understand why she can not walk    Mobility Bed Mobility Bed Mobility: Rolling Right;Rolling Left Rolling Right: 1: +2 Total assist Rolling Right: Patient Percentage: 20% Rolling Left: 1: +2 Total assist Rolling Left: Patient Percentage: 20% Details for Bed Mobility Assistance: Pt able to assist with rolling left/right to place lift pad underneath.  +3 (A) for lines and safety for proper pad placement. Transfers Transfer via Scientist, water quality Details for Transfer Assistance: Continued to use maxi sky to get pt from to recliner with +3 (A) to keep LE elevated, lines and maneuver maxisky.   Exercises    Balance    End of Session OT - End of Session Activity Tolerance: Patient tolerated treatment well Patient left: in chair;with family/visitor present;with call bell/phone within reach Nurse Communication: Need for lift equipment  GO     Brittni Hult,HILLARY 07/15/2012, 3:46 PM [facility], OTR/L  [phone] 07/15/2012

## 2012-07-15 NOTE — Progress Notes (Signed)
Occupational Therapy Treatment Patient Details Name: Jill Bowman MRN: [id number] DOB: [birth year] Today's Date: 07/15/2012 Time: 1308-6578 OT Time Calculation (min): 19 min  OT Assessment / Plan / Recommendation Comments on Treatment Session Worked with staff  and family to educate on use of lift equipment and allow pt to become more comfortable with use of maxisky to improve level of participation. Will get [facility] and attempt use of lift with toileting.    Follow Up Recommendations  Skilled nursing facility    Barriers to Discharge       Equipment Recommendations  Defer to next venue    Recommendations for Other Services    Frequency Min 2X/week   Plan Discharge plan remains appropriate    Precautions / Restrictions Precautions Precautions: Fall Precaution Comments: Multiple lines, 2 wound vacs, external fixator on RLE Restrictions Weight Bearing Restrictions: Yes RLE Weight Bearing: Non weight bearing LLE Weight Bearing: Non weight bearing   Pertinent Vitals/Pain 5    ADL  Grooming: Set up;Performed ADL Comments: Brushing teeth OOB    OT Diagnosis:    OT Problem List:   OT Treatment Interventions:     OT Goals Acute Rehab OT Goals OT Goal Formulation: With patient Time For Goal Achievement: 07/23/12 Potential to Achieve Goals: Good ADL Goals Pt Will Perform Grooming: with max assist;Supported;Supine, head of bed up ADL Goal: Grooming - Progress: Met Pt Will Perform Upper Body Bathing: with min assist;Supine, head of bed up;Sitting, chair;Supported ADL Goal: Product manager - Progress: Goal set today Pt Will Transfer to Toilet: with 2+ total assist;Other (comment) (using lift equipment/maxi sky) ADL Goal: Toilet Transfer - Progress: Goal set today Arm Goals Pt Will Perform AROM: Bilateral upper extremities;1 set;5 reps;to maintain range of motion;to decrease contractures Arm Goal: AROM - Progress: Met Pt Will Tolerate PROM: with caregiver independent  in performing;to maintain range of motion;to decrease contracture;Bilateral upper extremities;1 set;10 reps Arm Goal: PROM - Progress: Met Pt Will Complete Theraband Exer: with supervision, verbal cues required/provided;to increase strength;Bilateral upper extremities;2 sets;Level 1 Theraband Arm Goal: Theraband Exercises - Progress: Goal set today Miscellaneous OT Goals Miscellaneous OT Goal #1: Pt will tolerate sitting EOB for 20 minutes working on sitting balance OT Goal: Miscellaneous Goal #1 - Progress: Progressing toward goals Miscellaneous OT Goal #2: Pt will initiate rolling right and left with her arms. OT Goal: Miscellaneous Goal #2 - Progress: Progressing toward goals  Visit Information  Last OT Received On: 07/15/12 Assistance Needed: +3 or more    Subjective Data      Prior Functioning       Cognition  Overall Cognitive Status: Appears within functional limits for tasks assessed/performed Area of Impairment: Following commands Arousal/Alertness: Awake/alert Behavior During Session: [facility] for tasks performed Following Commands: Follows multi-step commands consistently Cognition - Other Comments: Pt does not understand why she can not walk    Mobility Bed Mobility Bed Mobility: Rolling Right;Rolling Left Rolling Right: 1: +2 Total assist Rolling Right: Patient Percentage: 20% Rolling Left: 1: +2 Total assist Rolling Left: Patient Percentage: 20% Details for Bed Mobility Assistance: Pt able to assist with rolling left/right to place lift pad underneath.  +3 (A) for lines and safety for proper pad placement. Transfers Transfer via Scientist, water quality Details for Transfer Assistance: Continued to use maxi sky to get pt from to recliner with +3 (A) to keep LE elevated, lines and maneuver maxisky.   Exercises General Exercises - Upper Extremity Shoulder Flexion: Theraband;Both;10 reps;Strengthening Theraband Level (Shoulder Flexion): Level  1 (Yellow) Shoulder  ABduction: Strengthening;Both;10 reps;Theraband Theraband Level (Shoulder Abduction): Level 1 (Yellow) Elbow Flexion: Both;10 reps;Supine;Theraband Theraband Level (Elbow Flexion): Level 1 (Yellow) Elbow Extension: Both;10 reps;Supine;Strengthening;Theraband Theraband Level (Elbow Extension): Level 1 (Yellow)  Balance    End of Session OT - End of Session Activity Tolerance: Patient tolerated treatment well Patient left: in bed;with call bell/phone within reach;with family/visitor present;with nursing in room Nurse Communication: Need for lift equipment  GO     Daphyne Miguez,HILLARY 07/15/2012, 3:58 PM [facility], OTR/L  [phone] 07/15/2012

## 2012-07-15 NOTE — Progress Notes (Signed)
Physical Therapy Treatment Patient Details Name: Jill Bowman MRN: [id number] DOB: 1929/07/18 Today's Date: 07/15/2012 Time: 4098-1191 PT Time Calculation (min): 30 min  PT Assessment / Plan / Recommendation Comments on Treatment Session  Continue to use maxisky to transfer pt from bed to recliner with +3 needed for lines and safety.  Will continue to follow.    Follow Up Recommendations  Skilled nursing facility    Barriers to Discharge        Equipment Recommendations  Defer to next venue    Recommendations for Other Services    Frequency Min 3X/week   Plan Discharge plan remains appropriate    Precautions / Restrictions Precautions Precautions: Fall Precaution Comments: Multiple lines, 2 wound vacs, external fixator on RLE Restrictions Weight Bearing Restrictions: Yes RLE Weight Bearing: Non weight bearing LLE Weight Bearing: Non weight bearing   Pertinent Vitals/Pain C/o right breast pain but does not rate.  No other pain noted    Mobility  Bed Mobility Bed Mobility: Rolling Right;Rolling Left Rolling Right: 1: +2 Total assist Rolling Right: Patient Percentage: 20% Rolling Left: 1: +2 Total assist Rolling Left: Patient Percentage: 20% Details for Bed Mobility Assistance: Pt able to assist with rolling left/right to place lift pad underneath.  +3 (A) for lines and safety for proper pad placement. Transfers Transfers:  (used lift) Transfer via Scientist, water quality Details for Transfer Assistance: Continued to use maxi sky to get pt from to recliner with +3 (A) to keep LE elevated, lines and maneuver maxisky.    Exercises     PT Diagnosis:    PT Problem List:   PT Treatment Interventions:     PT Goals Acute Rehab PT Goals PT Goal Formulation: With patient/family Time For Goal Achievement: 07/23/12 Potential to Achieve Goals: Good Pt will Roll Supine to Right Side: with +2 total assist;with rail PT Goal: Rolling Supine to Right Side - Progress: Partly  met Pt will Roll Supine to Left Side: with +2 total assist;with rail PT Goal: Rolling Supine to Left Side - Progress: Progressing toward goal  Visit Information  Last PT Received On: 07/15/12 Assistance Needed: +3 or more PT/OT Co-Evaluation/Treatment: Yes    Subjective Data      Cognition  Overall Cognitive Status: Appears within functional limits for tasks assessed/performed Area of Impairment: Following commands Arousal/Alertness: Awake/alert Behavior During Session: [facility] for tasks performed Following Commands: Follows multi-step commands consistently    Balance     End of Session PT - End of Session Equipment Utilized During Treatment: Other (comment) Activity Tolerance: Patient limited by fatigue;Patient limited by pain Patient left: in chair;with nursing in room Nurse Communication: Need for lift equipment   GP     Jill Bowman 07/15/2012, 12:57 PM Jake Shark, PT DPT [phone]
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
## 2012-07-15 NOTE — Progress Notes (Signed)
Trauma Service Note  Subjective: Complaining about pain in her right breat primarily.  Says her lower extremities don feel great either.  Objective: Vital signs in last 24 hours: Temp:  [97.9 F (36.6 C)-101 F (38.3 C)] 98.2 F (36.8 C) (08/19 0611) Pulse Rate:  [60-75] 64  (08/19 0611) Resp:  [16-18] 16  (08/19 0611) BP: (116-122)/(38-52) 116/52 mmHg (08/19 0611) SpO2:  [95 %-100 %] 98 % (08/19 0611) Last BM Date: 07/15/12  Intake/Output from previous day: 08/18 0701 - 08/19 0700 In: 420 [P.O.:420] Out: 2150 [Urine:2150] Intake/Output this shift: Total I/O In: 120 [P.O.:120] Out: -   General: No acute distress.  In bradycardic rhythm on Tele, no pacer spikes.  Lungs: Clear  Abd: Soft, liquid stools.  Extremities: Still in fixator device.  Moving well.  Neuro: Intact  Lab Results: CBC  No results found for this basename: WBC:2,HGB:2,HCT:2,PLT:2 in the last 72 hours BMET No results found for this basename: NA:2,K:2,CL:2,CO2:2,GLUCOSE:2,BUN:2,CREATININE:2,CALCIUM:2 in the last 72 hours PT/INR No results found for this basename: LABPROT:2,INR:2 in the last 72 hours ABG No results found for this basename: PHART:2,PCO2:2,PO2:2,HCO3:2 in the last 72 hours  Studies/Results: No results found.  Anti-infectives: Anti-infectives     Start     Dose/Rate Route Frequency Ordered Stop   07/10/12 1200   cefTRIAXone (ROCEPHIN) 1 g in dextrose 5 % 50 mL IVPB        1 g 100 mL/hr over 30 Minutes Intravenous Every 24 hours 07/10/12 1059 07/14/12 1209   07/05/12 1117   vancomycin (VANCOCIN) powder  Status:  Discontinued          As needed 07/05/12 1117 07/05/12 1809   07/05/12 1116   tobramycin (NEBCIN) powder  Status:  Discontinued          As needed 07/05/12 1117 07/05/12 1809   07/03/12 2300   ceFAZolin (ANCEF) IVPB 2 g/50 mL premix  Status:  Discontinued        2 g 100 mL/hr over 30 Minutes Intravenous 3 times per day 07/03/12 1731 07/10/12 1058   07/03/12 1930    tobramycin (NEBCIN) powder 1.2 g        1.2 g Topical To Surgery 07/03/12 1917 07/03/12 2135   07/03/12 1930   vancomycin (VANCOCIN) powder 1,000 mg        1,000 mg Other To Surgery 07/03/12 1917 07/03/12 2136   07/03/12 1745   gentamicin (GARAMYCIN) IVPB 100 mg        100 mg 200 mL/hr over 30 Minutes Intravenous  Once 07/03/12 1733     07/03/12 1630   ceFAZolin (ANCEF) IVPB 1 g/50 mL premix        1 g 100 mL/hr over 30 Minutes Intravenous STAT 07/03/12 1629 07/03/12 1700   07/03/12 1628   ceFAZolin (ANCEF) 1,000 mg in dextrose 5 % 50 mL IVPB        1,000 mg over 30 Minutes Intravenous As needed 07/03/12 1629 07/03/12 1628   07/03/12 1615   ceFAZolin (ANCEF) IVPB 2 g/50 mL premix        2 g 100 mL/hr over 30 Minutes Intravenous STAT 07/03/12 1611 07/03/12 1700          Assessment/Plan: s/p Procedure(s): IRRIGATION AND DEBRIDEMENT EXTREMITY APPLICATION OF WOUND VAC REMOVAL EXTERNAL FIXATION LEG OPEN REDUCTION INTERNAL FIXATION (ORIF) TIBIA FRACTURE INTRAMEDULLARY (IM) NAIL FEMORAL Stop colace and dulcolax. Consider Rehab consultation.   LOS: 12 days   Marta Lamas. Gae Bon, MD, FACS [phone] Trauma Surgeon  07/15/2012   

## 2012-07-16 DIAGNOSIS — Z950 Presence of cardiac pacemaker: Secondary | ICD-10-CM | POA: Diagnosis present

## 2012-07-16 DIAGNOSIS — I2510 Atherosclerotic heart disease of native coronary artery without angina pectoris: Secondary | ICD-10-CM | POA: Diagnosis present

## 2012-07-16 HISTORY — DX: Presence of cardiac pacemaker: Z95.0

## 2012-07-16 MED ORDER — ENOXAPARIN SODIUM 30 MG/0.3ML ~~LOC~~ SOLN: 30.0000 mg | Freq: Two times a day (BID) | SUBCUTANEOUS | Status: DC

## 2012-07-16 MED ORDER — HYDROCODONE-ACETAMINOPHEN 5-325 MG PO TABS: 1.0000 | ORAL_TABLET | ORAL | Status: AC | PRN

## 2012-07-16 MED ORDER — TRAMADOL HCL 50 MG PO TABS: 100.0000 mg | ORAL_TABLET | Freq: Four times a day (QID) | ORAL | Status: AC
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
## 2012-07-16 NOTE — Progress Notes (Signed)
Messages left for patient's daughter Jill Bowman 3 with no return call.  Just received call from son Jill Bowman who stated that they were still "touring" facilities and would be back to the hospital "in a little bit." They have not chosen a facility at this time. Met with patient and her daughter Jill Bowman today- they would like to be placed in the same room if possible; if not- still want same facility.  Patient's wound vacs will be d/c'd today.  ? DC today if bed available.  MD:  Please advise.  Thanks.  Jill Bowman. [name]  [phone]

## 2012-07-16 NOTE — Progress Notes (Signed)
I have seen and examined the patient. I agree with the findings above.  Alesha Jaffee H, MD  
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
## 2012-07-16 NOTE — Progress Notes (Addendum)
11 Days Post-Op  Subjective: Feels better today.  Tolerating diet.  Objective: Vital signs in last 24 hours: Temp:  [98.2 F (36.8 C)-98.5 F (36.9 C)] 98.5 F (36.9 C) (08/20 0557) Pulse Rate:  [66-73] 70  (08/20 0557) Resp:  [18-20] 20  (08/20 0557) BP: (105-130)/(35-42) 114/37 mmHg (08/20 0557) SpO2:  [98 %-100 %] 99 % (08/20 0557) Last BM Date: 07/15/12  Intake/Output from previous day: 08/19 0701 - 08/20 0700 In: 360 [P.O.:360] Out: 1920 [Urine:1900; Drains:20] Intake/Output this shift:    PE: CV-RRR Lungs-clear Abd-soft, ecchymosis on lower abdominal wall Extr-external fixation device on right lower leg, bruising in LLE, feet warm  Lab Results:  No results found for this basename: WBC:2,HGB:2,HCT:2,PLT:2 in the last 72 hours BMET No results found for this basename: NA:2,K:2,CL:2,CO2:2,GLUCOSE:2,BUN:2,CREATININE:2,CALCIUM:2 in the last 72 hours PT/INR No results found for this basename: LABPROT:2,INR:2 in the last 72 hours Comprehensive Metabolic Panel:    Component Value Date/Time   NA 139 07/12/2012 0400   K 3.5 07/12/2012 0400   CL 105 07/12/2012 0400   CO2 25 07/12/2012 0400   BUN 19 07/12/2012 0400   CREATININE 0.62 07/12/2012 0400   GLUCOSE 109* 07/12/2012 0400   CALCIUM 7.9* 07/12/2012 0400   AST 75* 07/04/2012 0500   ALT 31 07/04/2012 0500   ALKPHOS 46 07/04/2012 0500   BILITOT 0.8 07/04/2012 0500   PROT 4.6* 07/04/2012 0500   ALBUMIN 2.7* 07/04/2012 0500     Studies/Results: No results found.  Anti-infectives: Anti-infectives     Start     Dose/Rate Route Frequency Ordered Stop   07/10/12 1200   cefTRIAXone (ROCEPHIN) 1 g in dextrose 5 % 50 mL IVPB        1 g 100 mL/hr over 30 Minutes Intravenous Every 24 hours 07/10/12 1059 07/14/12 1209   07/05/12 1117   vancomycin (VANCOCIN) powder  Status:  Discontinued          As needed 07/05/12 1117 07/05/12 1809   07/05/12 1116   tobramycin (NEBCIN) powder  Status:  Discontinued          As needed 07/05/12 1117  07/05/12 1809   07/03/12 2300   ceFAZolin (ANCEF) IVPB 2 g/50 mL premix  Status:  Discontinued        2 g 100 mL/hr over 30 Minutes Intravenous 3 times per day 07/03/12 1731 07/10/12 1058   07/03/12 1930   tobramycin (NEBCIN) powder 1.2 g        1.2 g Topical To Surgery 07/03/12 1917 07/03/12 2135   07/03/12 1930   vancomycin (VANCOCIN) powder 1,000 mg        1,000 mg Other To Surgery 07/03/12 1917 07/03/12 2136   07/03/12 1745   gentamicin (GARAMYCIN) IVPB 100 mg        100 mg 200 mL/hr over 30 Minutes Intravenous  Once 07/03/12 1733     07/03/12 1630   ceFAZolin (ANCEF) IVPB 1 g/50 mL premix        1 g 100 mL/hr over 30 Minutes Intravenous STAT 07/03/12 1629 07/03/12 1700   07/03/12 1628   ceFAZolin (ANCEF) 1,000 mg in dextrose 5 % 50 mL IVPB        1,000 mg over 30 Minutes Intravenous As needed 07/03/12 1629 07/03/12 1628   07/03/12 1615   ceFAZolin (ANCEF) IVPB 2 g/50 mL premix        2 g 100 mL/hr over 30 Minutes Intravenous STAT 07/03/12 1611 07/03/12 1700  Assessment Principal Problem:  *Acute respiratory failure with hypoxia-resolved. Active Problems:  Open displaced pilon fracture of right tibia, type IIIA, IIIB, or IIIC  Fracture of tibial shaft, left, open  Periprosthetic fracture around internal prosthetic right knee joint  Periprosthetic fracture around internal prosthetic left knee joint  Multiple closed fractures of metatarsal bone, left foot  Degloving injury of lower leg, Left  CAD (coronary artery disease)  Pacemaker  Morbid obesity    LOS: 13 days   Plan: RLE limb salvage per Ortho.  Placement pending.   Robie Oats Shela Commons 07/16/2012
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
## 2012-07-17 LAB — CBC
Platelets: 392 10*3/uL (ref 150–400)
RBC: 3.22 MIL/uL — ABNORMAL LOW (ref 3.87–5.11)
RDW: 16.3 % — ABNORMAL HIGH (ref 11.5–15.5)
WBC: 8.9 10*3/uL (ref 4.0–10.5)

## 2012-07-17 LAB — BASIC METABOLIC PANEL
CO2: 27 mEq/L (ref 19–32)
Chloride: 102 mEq/L (ref 96–112)
GFR calc Af Amer: >90 mL/min (ref >90)
Potassium: 4.3 mEq/L (ref 3.5–5.1)
Sodium: 135 mEq/L (ref 135–145)

## 2012-07-17 NOTE — Progress Notes (Signed)
Physical Therapy Treatment Patient Details Name: Jill Bowman MRN: [id number] DOB: [birth year] Today's Date: 07/17/2012 Time: 1478-2956 PT Time Calculation (min): 22 min  PT Assessment / Plan / Recommendation Comments on Treatment Session  Continue to use maxisky for transfers with minimal pain reported from pt.  Pt able to tolerate HEP.  Possible d/c for SNF today.    Follow Up Recommendations  Skilled nursing facility    Barriers to Discharge        Equipment Recommendations  Defer to next venue    Recommendations for Other Services    Frequency Min 3X/week   Plan Discharge plan remains appropriate    Precautions / Restrictions Precautions Precautions: Fall Restrictions Weight Bearing Restrictions: Yes RLE Weight Bearing: Non weight bearing LLE Weight Bearing: Non weight bearing   Pertinent Vitals/Pain C/o right breast pain; but does not rate     Mobility  Bed Mobility Bed Mobility: Rolling Right;Rolling Left Rolling Right: 1: +2 Total assist Rolling Right: Patient Percentage: 50% Rolling Left: 1: +2 Total assist Rolling Left: Patient Percentage: 50% Details for Bed Mobility Assistance: (A) to initiate roll and complete with cues for hand placement and use of rail. Transfers Transfer via Scientist, water quality Details for Transfer Assistance: Continue to use maxisky to get OOB Ambulation/Gait Ambulation/Gait Assistance: Not tested (comment)    Exercises General Exercises - Lower Extremity Quad Sets: Strengthening;Both;10 reps Gluteal Sets: Strengthening;Both;10 reps Straight Leg Raises: Strengthening;Left;10 reps;AAROM   PT Diagnosis:    PT Problem List:   PT Treatment Interventions:     PT Goals Acute Rehab PT Goals PT Goal Formulation: With patient/family Time For Goal Achievement: 07/23/12 Potential to Achieve Goals: Good Pt will Roll Supine to Right Side: with +2 total assist;with rail PT Goal: Rolling Supine to Right Side - Progress: Met Pt  will Roll Supine to Left Side: with +2 total assist;with rail PT Goal: Rolling Supine to Left Side - Progress: Met  Visit Information  Last PT Received On: 07/17/12 Assistance Needed: +3 or more PT/OT Co-Evaluation/Treatment: Yes    Subjective Data      Cognition  Overall Cognitive Status: History of cognitive impairments - at baseline Area of Impairment: Memory    Balance     End of Session PT - End of Session Equipment Utilized During Treatment: Other (comment) (maxisky) Activity Tolerance: Patient limited by fatigue;Patient limited by pain Patient left: in chair;with nursing in room Nurse Communication: Need for lift equipment   GP     Bernardine Langworthy 07/17/2012, 10:11 AM Jake Shark, PT DPT [phone]

## 2012-07-17 NOTE — Clinical Social Work Note (Signed)
Clinical Social Worker spoke with patient and patient children at bedside to discuss patient plans at discharge.  Patient and family have decided to go to [facility] and plan is for discharge tomorrow.  PA and facility aware.  Patient and family both understand that patient and daughter will not be placed in the same facility and would like to proceed anyway.  Clinical Social Worker will facilitate patient discharge needs.  Macario Golds, [place] [phone]
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
## 2012-07-17 NOTE — Progress Notes (Signed)
Occupational Therapy Treatment Patient Details Name: Latori Beggs MRN: [id number] DOB: [birth year] Today's Date: 07/17/2012 Time: 0930-     OT Assessment / Plan / Recommendation Comments on Treatment Session Excellent participaiton. Very little c/o pain during mobility. Improving with ADL    Follow Up Recommendations  Skilled nursing facility    Barriers to Discharge       Equipment Recommendations  Defer to next venue    Recommendations for Other Services    Frequency Min 2X/week   Plan Discharge plan remains appropriate    Precautions / Restrictions Precautions Precautions: Fall Restrictions Weight Bearing Restrictions: Yes RLE Weight Bearing: Non weight bearing LLE Weight Bearing: Non weight bearing   Pertinent Vitals/Pain 4    ADL  Grooming: Performed;Set up Where Assessed - Grooming: Supported sitting Upper Body Bathing: Performed;Set up Where Assessed - Upper Body Bathing: Supported sitting Upper Body Dressing: Performed;Set up (with gown) Where Assessed - Upper Body Dressing: Supported sitting Equipment Used:  (lift equip) Transfers/Ambulation Related to ADLs: using [place]ove to assist with transfer. Increased participation. ADL Comments: Increaseing independence with Bathing    OT Diagnosis:    OT Problem List:   OT Treatment Interventions:     OT Goals Acute Rehab OT Goals OT Goal Formulation: With patient Time For Goal Achievement: 07/24/12 Potential to Achieve Goals: Good ADL Goals Pt Will Perform Grooming: with max assist;Supported;Supine, head of bed up ADL Goal: Grooming - Progress: Met Pt Will Perform Upper Body Bathing: with min assist;Supine, head of bed up;Sitting, chair;Supported ADL Goal: Product manager - Progress: Met Pt Will Perform Upper Body Dressing: with set-up ADL Goal: Upper Body Dressing - Progress: Goal set today Pt Will Transfer to Toilet: with 2+ total assist;Other (comment) ADL Goal: Toilet Transfer - Progress:  Progressing toward goals Arm Goals Pt Will Perform AROM: Bilateral upper extremities;1 set;5 reps;to maintain range of motion;to decrease contractures Arm Goal: AROM - Progress: Met Pt Will Tolerate PROM: with caregiver independent in performing;to maintain range of motion;to decrease contracture;Bilateral upper extremities;1 set;10 reps Arm Goal: PROM - Progress: Met Pt Will Complete Theraband Exer: with supervision, verbal cues required/provided;to increase strength;Bilateral upper extremities;2 sets;Level 1 Theraband Arm Goal: Theraband Exercises - Progress: Met Miscellaneous OT Goals Miscellaneous OT Goal #1: Pt will tolerate sitting EOB for 20 minutes working on sitting balance OT Goal: Miscellaneous Goal #1 - Progress: Progressing toward goals Miscellaneous OT Goal #2: Pt will initiate rolling right and left with her arms. OT Goal: Miscellaneous Goal #2 - Progress: Met  Visit Information  Last OT Received On: 07/17/12 Assistance Needed: +3 or more    Subjective Data      Prior Functioning       Cognition  Overall Cognitive Status: History of cognitive impairments - at baseline Area of Impairment: Memory    Mobility Bed Mobility Bed Mobility: Rolling Right;Rolling Left Rolling Right: 1: +2 Total assist Rolling Right: Patient Percentage: 50% Rolling Left: 1: +2 Total assist Rolling Left: Patient Percentage: 50% Details for Bed Mobility Assistance: (A) to initiate roll and complete with cues for hand placement and use of rail. Transfers Transfer via Scientist, water quality Details for Transfer Assistance: Continue to use maxisky to get OOB   Exercises General Exercises - Lower Extremity Quad Sets: Strengthening;Both;10 reps Gluteal Sets: Strengthening;Both;10 reps Straight Leg Raises: Strengthening;Left;10 reps;AAROM Other Exercises Other Exercises: reviewed use of theraband  Balance    End of Session OT - End of Session Activity Tolerance: Patient tolerated treatment  well Patient left: in  chair;with call bell/phone within reach;with family/visitor present Nurse Communication: Need for lift equipment  GO     Rajat Staver,HILLARY 07/17/2012, 11:19 AM [facility], OTR/L  [phone] 07/17/2012

## 2012-07-17 NOTE — Progress Notes (Signed)
Patients BP and HR are slowly decreasing. Patient is asymptomatic  MD notified and am medication given. Will continue to monitor. No complaints or discomfort at this time.

## 2012-07-18 ENCOUNTER — Encounter (HOSPITAL_COMMUNITY): Payer: Self-pay | Admitting: General Practice

## 2012-07-18 DIAGNOSIS — M1990 Unspecified osteoarthritis, unspecified site: Secondary | ICD-10-CM

## 2012-07-18 DIAGNOSIS — R0602 Shortness of breath: Secondary | ICD-10-CM

## 2012-07-18 HISTORY — DX: Shortness of breath: R06.02

## 2012-07-18 HISTORY — DX: Unspecified osteoarthritis, unspecified site: M19.90

## 2012-07-18 LAB — CARDIAC PANEL(CRET KIN+CKTOT+MB+TROPI)
CK, MB: 1.5 ng/mL (ref 0.3–4.0)
Relative Index: 1.2 (ref 0.0–2.5)
Total CK: 123 U/L (ref 7–177)
Troponin I: <0.3 ng/mL (ref <0.30)

## 2012-07-18 NOTE — Discharge Summary (Signed)
This patient has been seen and I agree with the findings and treatment plan.  Pansey Pinheiro O. Rayce Brahmbhatt, III, MD, FACS [phone] (pager) [phone] (direct pager) Trauma Surgeon  

## 2012-07-18 NOTE — Plan of Care (Signed)
Problem: Phase II Progression Outcomes Goal: Obtain order to discontinue catheter if appropriate Outcome: Not Met (add Reason) Foley was re-inserted 07/17/12 due to unable to void, plan to discharge to SNF with foley
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
## 2012-07-18 NOTE — Progress Notes (Signed)
PT Cancellation Note  Treatment cancelled today due to patient's refusal to participate.  Spoke with pt and pt to d/c to SNF today per SW and pt refuse PT at this time.  Will defer to SNF.  Thanks!!  Ason Heslin 07/18/2012, 3:07 PM Jake Shark, PT DPT [phone]

## 2012-07-18 NOTE — Progress Notes (Signed)
Okay for discharge.  This patient has been seen and I agree with the findings and treatment plan.  Marta Lamas. Gae Bon, MD, FACS [phone] (pager) [phone] (direct pager) Trauma Surgeon
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
## 2012-07-18 NOTE — Progress Notes (Signed)
I saw and examined the patient with Mr. Jill Bowman, communicating the findings and plan noted above.  Budd Palmer, MD 07/18/2012 3:06 PM

## 2012-07-18 NOTE — Clinical Social Work Note (Signed)
Clinical Social Worker spoke with patient and patient family at the bedside to confirm discharge plans.  Patient states that she thought about her options last night and prefers to go to the same facility as her daughter Sports administrator).  Patient family is split on their decisions and CSW kindly explained that patient is alert and oriented and able to make her own decisions.  MD/PA clarified that patient has capacity to make her own decisions and has decided that she would prefer to go to the facility with her daughter no matter what other family members feel is important.  CSW spoke with patient son and daughter over the phone with patient permission to confirm patient new discharge plans.  Patient family unhappy with the situation but understand that it is patient decision and she has the right to chose where she would like to go.  Clinical Social Worker facilitated patient discharge including contacting patient family and facility to confirm discharge plans and make arrangements for ambulance transport to [facility].  Patient has all of her belongings from her family and is dressed and prepared to go to facility.  Facility is prepared for patient admission.  RN to call report prior to patient discharge.  Clinical Social Worker will sign off for now as social work intervention is no longer needed. Please consult [place] again if new need arises.  Macario Golds, [place] [phone]
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
## 2012-07-19 ENCOUNTER — Encounter: Payer: Self-pay | Admitting: Internal Medicine

## 2012-07-19 NOTE — Discharge Summary (Signed)
Agree with summary. 

## 2012-08-16 ENCOUNTER — Encounter (HOSPITAL_COMMUNITY): Payer: Self-pay | Admitting: Emergency Medicine

## 2012-08-16 ENCOUNTER — Emergency Department (HOSPITAL_COMMUNITY)
Admission: EM | Admit: 2012-08-16 | Discharge: 2012-08-17 | Disposition: A | Discharge status: Skilled Nursing Facility | Payer: Medicare Other | Attending: Emergency Medicine | Admitting: Emergency Medicine

## 2012-08-16 ENCOUNTER — Emergency Department (HOSPITAL_COMMUNITY): Payer: Medicare Other

## 2012-08-16 DIAGNOSIS — K5900 Constipation, unspecified: Secondary | ICD-10-CM | POA: Insufficient documentation

## 2012-08-16 DIAGNOSIS — N390 Urinary tract infection, site not specified: Secondary | ICD-10-CM

## 2012-08-16 DIAGNOSIS — Z7982 Long term (current) use of aspirin: Secondary | ICD-10-CM | POA: Insufficient documentation

## 2012-08-16 DIAGNOSIS — R1031 Right lower quadrant pain: Secondary | ICD-10-CM | POA: Insufficient documentation

## 2012-08-16 DIAGNOSIS — F29 Unspecified psychosis not due to a substance or known physiological condition: Secondary | ICD-10-CM | POA: Insufficient documentation

## 2012-08-16 DIAGNOSIS — R410 Disorientation, unspecified: Secondary | ICD-10-CM

## 2012-08-16 DIAGNOSIS — R5381 Other malaise: Secondary | ICD-10-CM | POA: Insufficient documentation

## 2012-08-16 DIAGNOSIS — Z79899 Other long term (current) drug therapy: Secondary | ICD-10-CM | POA: Insufficient documentation

## 2012-08-16 LAB — URINE MICROSCOPIC-ADD ON

## 2012-08-16 LAB — CBC WITH DIFFERENTIAL/PLATELET
Basophils Absolute: 0 10*3/uL (ref 0.0–0.1)
HCT: 39.1 % (ref 36.0–46.0)
Lymphocytes Relative: 31 % (ref 12–46)
Neutro Abs: 5.5 10*3/uL (ref 1.7–7.7)
Platelets: 275 10*3/uL (ref 150–400)
RBC: 4.23 MIL/uL (ref 3.87–5.11)
RDW: 16 % — ABNORMAL HIGH (ref 11.5–15.5)
WBC: 10.2 10*3/uL (ref 4.0–10.5)

## 2012-08-16 LAB — COMPREHENSIVE METABOLIC PANEL
ALT: 9 U/L (ref 0–35)
AST: 26 U/L (ref 0–37)
Alkaline Phosphatase: 125 U/L — ABNORMAL HIGH (ref 39–117)
CO2: 28 mEq/L (ref 19–32)
Chloride: 94 mEq/L — ABNORMAL LOW (ref 96–112)
GFR calc non Af Amer: 53 mL/min — ABNORMAL LOW (ref >90)
Potassium: 3.4 mEq/L — ABNORMAL LOW (ref 3.5–5.1)
Sodium: 137 mEq/L (ref 135–145)
Total Bilirubin: 0.6 mg/dL (ref 0.3–1.2)

## 2012-08-16 LAB — URINALYSIS, ROUTINE W REFLEX MICROSCOPIC
Bilirubin Urine: NEGATIVE
Protein, ur: 30 mg/dL — AB
Specific Gravity, Urine: 1.018 (ref 1.005–1.030)
Urobilinogen, UA: 0.2 mg/dL (ref 0.0–1.0)

## 2012-08-16 MED ORDER — DEXTROSE 5 % IV SOLN
1.0000 g | Freq: Once | INTRAVENOUS | Status: AC
  Administered 2012-08-16: 1 g via INTRAVENOUS
  Filled 2012-08-16: qty 10

## 2012-08-16 MED ORDER — MORPHINE SULFATE 4 MG/ML IJ SOLN
4.0000 mg | Freq: Once | INTRAMUSCULAR | Status: AC
  Administered 2012-08-16: 4 mg via INTRAVENOUS
  Filled 2012-08-16: qty 1

## 2012-08-16 MED ORDER — IOHEXOL 300 MG/ML  SOLN
20.0000 mL | INTRAMUSCULAR | Status: AC
  Administered 2012-08-16 (×2): 20 mL via ORAL

## 2012-08-16 MED ORDER — IOHEXOL 300 MG/ML  SOLN
100.0000 mL | Freq: Once | INTRAMUSCULAR | Status: AC | PRN
  Administered 2012-08-16: 100 mL via INTRAVENOUS

## 2012-08-16 MED ORDER — ONDANSETRON HCL 4 MG/2ML IJ SOLN
4.0000 mg | Freq: Once | INTRAMUSCULAR | Status: AC
  Administered 2012-08-16: 4 mg via INTRAVENOUS
  Filled 2012-08-16: qty 2
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
## 2012-08-16 NOTE — ED Notes (Signed)
Patient transported to X-ray 

## 2012-08-16 NOTE — ED Notes (Signed)
RLQ abdominal pain for 3 days.  Pain 8/10 with constipation.  Patient is taking 3 types of pain medication for bilateral lower extremity fractures with rods placed on right lower extremity.

## 2012-08-16 NOTE — ED Notes (Signed)
Patient transported to CT 

## 2012-08-17 MED ORDER — LORAZEPAM 1 MG PO TABS: 1.0000 mg | ORAL_TABLET | Freq: Three times a day (TID) | ORAL | Status: DC | PRN

## 2012-08-17 MED ORDER — OXYCODONE-ACETAMINOPHEN 5-325 MG PO TABS
1.0000 | ORAL_TABLET | Freq: Once | ORAL | Status: AC
  Administered 2012-08-17: 1 via ORAL
  Filled 2012-08-17: qty 1

## 2012-08-17 MED ORDER — LORAZEPAM 1 MG PO TABS
1.0000 mg | ORAL_TABLET | Freq: Once | ORAL | Status: AC
  Administered 2012-08-17: 1 mg via ORAL
  Filled 2012-08-17: qty 1

## 2012-08-17 MED ORDER — CEFTRIAXONE SODIUM 1 G IJ SOLR: 1.0000 g | INTRAMUSCULAR | Status: DC

## 2012-08-17 NOTE — ED Provider Notes (Signed)
Jill Bowman is a 78 y.o. female here for evaluation of abdominal pain, for several days. Evaluation so far has shown urinary tract infection. CT abdomen was nondiagnostic for source of pain. Repeat vital signs reveal soft blood pressure. On exam, the patient is alert, cries at times, and appears anxious. The abdomen is soft, and tender in the right lower quadrant. She complains of pain in the right leg that has an external fixator on.   I discussed the case, Dr. Lovell Sheehan, her primary care provider. At the skilled nursing facility. She would like to manage her facility with, IM, Rocephin, continued oral pain medicine  and anxiolytic medication.   Medical screening examination/treatment/procedure(s) were conducted as a shared visit with non-physician practitioner(s) and myself.  I personally evaluated the patient during the encounter.  Flint Melter, MD 08/17/12 [phone]

## 2012-08-20 LAB — URINE CULTURE

## 2012-08-21 ENCOUNTER — Telehealth (HOSPITAL_COMMUNITY): Payer: Self-pay | Admitting: Emergency Medicine

## 2012-08-21 NOTE — ED Notes (Signed)
Results called from [facility].  (+) URNC, >/= 100,000 colonies Pant[place] Species.  Results faxed to PCP Dr Della Goo attn [phone].

## 2012-09-09 MED ORDER — CHLORHEXIDINE GLUCONATE 4 % EX LIQD: 60.0000 mL | Freq: Once | CUTANEOUS | Status: DC

## 2012-09-09 MED ORDER — VANCOMYCIN HCL 1000 MG IV SOLR
1500.0000 mg | INTRAVENOUS | Status: AC
  Administered 2012-09-10: 1250 mg via INTRAVENOUS
  Filled 2012-09-09: qty 1500

## 2012-09-09 MED ORDER — LACTATED RINGERS IV SOLN: INTRAVENOUS | Status: DC

## 2012-09-10 ENCOUNTER — Encounter (HOSPITAL_COMMUNITY): Payer: Self-pay | Admitting: Anesthesiology

## 2012-09-10 ENCOUNTER — Inpatient Hospital Stay (HOSPITAL_COMMUNITY): Payer: Medicare Other

## 2012-09-10 ENCOUNTER — Inpatient Hospital Stay (HOSPITAL_COMMUNITY)
Admission: RE | Admit: 2012-09-10 | Discharge: 2012-10-01 | DRG: 464 | Disposition: A | Discharge status: Skilled Nursing Facility | Payer: Medicare Other | Source: Ambulatory Visit | Attending: Orthopedic Surgery | Admitting: Orthopedic Surgery

## 2012-09-10 ENCOUNTER — Ambulatory Visit (HOSPITAL_COMMUNITY): Payer: Medicare Other

## 2012-09-10 ENCOUNTER — Encounter (HOSPITAL_COMMUNITY)
Admission: RE | Disposition: A | Discharge status: Skilled Nursing Facility | Payer: Self-pay | Source: Ambulatory Visit | Attending: Orthopedic Surgery

## 2012-09-10 ENCOUNTER — Ambulatory Visit (HOSPITAL_COMMUNITY): Payer: Medicare Other | Admitting: Anesthesiology

## 2012-09-10 ENCOUNTER — Encounter (HOSPITAL_COMMUNITY): Payer: Self-pay | Admitting: *Deleted

## 2012-09-10 DIAGNOSIS — Z79899 Other long term (current) drug therapy: Secondary | ICD-10-CM

## 2012-09-10 DIAGNOSIS — IMO0002 Reserved for concepts with insufficient information to code with codable children: Secondary | ICD-10-CM | POA: Diagnosis present

## 2012-09-10 DIAGNOSIS — R32 Unspecified urinary incontinence: Secondary | ICD-10-CM | POA: Diagnosis present

## 2012-09-10 DIAGNOSIS — Z87891 Personal history of nicotine dependence: Secondary | ICD-10-CM

## 2012-09-10 DIAGNOSIS — S8290XS Unspecified fracture of unspecified lower leg, sequela: Secondary | ICD-10-CM

## 2012-09-10 DIAGNOSIS — S93439A Sprain of tibiofibular ligament of unspecified ankle, initial encounter: Secondary | ICD-10-CM | POA: Diagnosis present

## 2012-09-10 DIAGNOSIS — Z950 Presence of cardiac pacemaker: Secondary | ICD-10-CM

## 2012-09-10 DIAGNOSIS — Z96659 Presence of unspecified artificial knee joint: Secondary | ICD-10-CM

## 2012-09-10 DIAGNOSIS — I959 Hypotension, unspecified: Secondary | ICD-10-CM | POA: Diagnosis not present

## 2012-09-10 DIAGNOSIS — N390 Urinary tract infection, site not specified: Secondary | ICD-10-CM | POA: Diagnosis present

## 2012-09-10 DIAGNOSIS — I2510 Atherosclerotic heart disease of native coronary artery without angina pectoris: Secondary | ICD-10-CM | POA: Diagnosis present

## 2012-09-10 DIAGNOSIS — L97909 Non-pressure chronic ulcer of unspecified part of unspecified lower leg with unspecified severity: Secondary | ICD-10-CM | POA: Diagnosis present

## 2012-09-10 DIAGNOSIS — S82301N Unspecified fracture of lower end of right tibia, subsequent encounter for open fracture type IIIA, IIIB, or IIIC with nonunion: Secondary | ICD-10-CM | POA: Diagnosis present

## 2012-09-10 DIAGNOSIS — Z9861 Coronary angioplasty status: Secondary | ICD-10-CM

## 2012-09-10 DIAGNOSIS — E871 Hypo-osmolality and hyponatremia: Secondary | ICD-10-CM

## 2012-09-10 DIAGNOSIS — R197 Diarrhea, unspecified: Secondary | ICD-10-CM | POA: Diagnosis not present

## 2012-09-10 DIAGNOSIS — J45909 Unspecified asthma, uncomplicated: Secondary | ICD-10-CM | POA: Diagnosis present

## 2012-09-10 DIAGNOSIS — B961 Klebsiella pneumoniae [K. pneumoniae] as the cause of diseases classified elsewhere: Secondary | ICD-10-CM | POA: Diagnosis present

## 2012-09-10 DIAGNOSIS — S82252N Displaced comminuted fracture of shaft of left tibia, subsequent encounter for open fracture type IIIA, IIIB, or IIIC with nonunion: Secondary | ICD-10-CM

## 2012-09-10 DIAGNOSIS — E785 Hyperlipidemia, unspecified: Secondary | ICD-10-CM | POA: Diagnosis present

## 2012-09-10 DIAGNOSIS — I9589 Other hypotension: Secondary | ICD-10-CM

## 2012-09-10 DIAGNOSIS — Z4789 Encounter for other orthopedic aftercare: Principal | ICD-10-CM

## 2012-09-10 DIAGNOSIS — E46 Unspecified protein-calorie malnutrition: Secondary | ICD-10-CM | POA: Diagnosis present

## 2012-09-10 DIAGNOSIS — M9080 Osteopathy in diseases classified elsewhere, unspecified site: Secondary | ICD-10-CM | POA: Diagnosis present

## 2012-09-10 DIAGNOSIS — R5381 Other malaise: Secondary | ICD-10-CM | POA: Diagnosis not present

## 2012-09-10 DIAGNOSIS — E876 Hypokalemia: Secondary | ICD-10-CM | POA: Diagnosis present

## 2012-09-10 DIAGNOSIS — L089 Local infection of the skin and subcutaneous tissue, unspecified: Secondary | ICD-10-CM

## 2012-09-10 DIAGNOSIS — M869 Osteomyelitis, unspecified: Secondary | ICD-10-CM

## 2012-09-10 DIAGNOSIS — I10 Essential (primary) hypertension: Secondary | ICD-10-CM | POA: Diagnosis present

## 2012-09-10 DIAGNOSIS — E1169 Type 2 diabetes mellitus with other specified complication: Secondary | ICD-10-CM | POA: Diagnosis present

## 2012-09-10 DIAGNOSIS — I4891 Unspecified atrial fibrillation: Secondary | ICD-10-CM | POA: Diagnosis present

## 2012-09-10 DIAGNOSIS — B9689 Other specified bacterial agents as the cause of diseases classified elsewhere: Secondary | ICD-10-CM | POA: Diagnosis present

## 2012-09-10 DIAGNOSIS — S82202B Unspecified fracture of shaft of left tibia, initial encounter for open fracture type I or II: Secondary | ICD-10-CM

## 2012-09-10 DIAGNOSIS — D62 Acute posthemorrhagic anemia: Secondary | ICD-10-CM | POA: Diagnosis not present

## 2012-09-10 HISTORY — PX: HARDWARE REMOVAL: SHX979

## 2012-09-10 HISTORY — DX: Urinary tract infection, site not specified: N39.0

## 2012-09-10 HISTORY — PX: EXTERNAL FIXATION REMOVAL: SHX5040

## 2012-09-10 HISTORY — DX: Osteomyelitis, unspecified: M86.9

## 2012-09-10 LAB — BASIC METABOLIC PANEL
BUN: 20 mg/dL (ref 6–23)
Creatinine, Ser: 0.77 mg/dL (ref 0.50–1.10)
GFR calc Af Amer: 89 mL/min — ABNORMAL LOW (ref >90)
GFR calc non Af Amer: 77 mL/min — ABNORMAL LOW (ref >90)
Glucose, Bld: 89 mg/dL (ref 70–99)

## 2012-09-10 LAB — CBC WITH DIFFERENTIAL/PLATELET
Eosinophils Relative: 7 % — ABNORMAL HIGH (ref 0–5)
HCT: 40.4 % (ref 36.0–46.0)
Lymphocytes Relative: 35 % (ref 12–46)
Lymphs Abs: 3.2 10*3/uL (ref 0.7–4.0)
MCV: 92.2 fL (ref 78.0–100.0)
Neutro Abs: 4.6 10*3/uL (ref 1.7–7.7)
Platelets: 254 10*3/uL (ref 150–400)
RBC: 4.38 MIL/uL (ref 3.87–5.11)
WBC: 9.2 10*3/uL (ref 4.0–10.5)

## 2012-09-10 LAB — COMPREHENSIVE METABOLIC PANEL
ALT: 9 U/L (ref 0–35)
AST: 22 U/L (ref 0–37)
Alkaline Phosphatase: 107 U/L (ref 39–117)
CO2: 30 mEq/L (ref 19–32)
Calcium: 9.2 mg/dL (ref 8.4–10.5)
Chloride: 94 mEq/L — ABNORMAL LOW (ref 96–112)
GFR calc Af Amer: 78 mL/min — ABNORMAL LOW (ref >90)
GFR calc non Af Amer: 67 mL/min — ABNORMAL LOW (ref >90)
Glucose, Bld: 94 mg/dL (ref 70–99)
Sodium: 135 mEq/L (ref 135–145)
Total Bilirubin: 0.5 mg/dL (ref 0.3–1.2)

## 2012-09-10 LAB — GRAM STAIN

## 2012-09-10 LAB — SURGICAL PCR SCREEN: Staphylococcus aureus: NEGATIVE

## 2012-09-10 SURGERY — REMOVAL, EXTERNAL FIXATION DEVICE, LOWER EXTREMITY
Anesthesia: General | Site: Leg Lower | Laterality: Right | Wound class: Clean

## 2012-09-10 MED ORDER — POTASSIUM CHLORIDE 10 MEQ/100ML IV SOLN
10.0000 meq | INTRAVENOUS | Status: AC
  Administered 2012-09-10 (×2): 10 meq via INTRAVENOUS
  Filled 2012-09-10: qty 100

## 2012-09-10 MED ORDER — FIRST-DUKES MOUTHWASH MT SUSP: 15.0000 mL | Freq: Three times a day (TID) | OROMUCOSAL | Status: DC | PRN

## 2012-09-10 MED ORDER — ROCURONIUM BROMIDE 100 MG/10ML IV SOLN
INTRAVENOUS | Status: DC | PRN
  Administered 2012-09-10: 35 mg via INTRAVENOUS

## 2012-09-10 MED ORDER — LACTATED RINGERS IV SOLN
INTRAVENOUS | Status: DC | PRN
  Administered 2012-09-10 (×2): via INTRAVENOUS

## 2012-09-10 MED ORDER — GABAPENTIN 300 MG PO CAPS
300.0000 mg | ORAL_CAPSULE | Freq: Every day | ORAL | Status: DC
  Administered 2012-09-10 – 2012-09-30 (×21): 300 mg via ORAL
  Filled 2012-09-10 (×23): qty 1

## 2012-09-10 MED ORDER — NEOSTIGMINE METHYLSULFATE 1 MG/ML IJ SOLN
INTRAMUSCULAR | Status: DC | PRN
  Administered 2012-09-10: 1 mg via INTRAVENOUS
  Administered 2012-09-10: 4 mg via INTRAVENOUS

## 2012-09-10 MED ORDER — POTASSIUM CHLORIDE CRYS ER 10 MEQ PO TBCR
10.0000 meq | EXTENDED_RELEASE_TABLET | Freq: Once | ORAL | Status: AC
  Administered 2012-09-10: 10 meq via ORAL
  Filled 2012-09-10: qty 1

## 2012-09-10 MED ORDER — ENOXAPARIN SODIUM 40 MG/0.4ML ~~LOC~~ SOLN
40.0000 mg | SUBCUTANEOUS | Status: DC
  Administered 2012-09-11: 40 mg via SUBCUTANEOUS
  Filled 2012-09-10 (×2): qty 0.4

## 2012-09-10 MED ORDER — HYDROCODONE-ACETAMINOPHEN 5-325 MG PO TABS
2.0000 | ORAL_TABLET | ORAL | Status: DC | PRN
  Administered 2012-09-11 – 2012-09-20 (×12): 2 via ORAL
  Administered 2012-09-22 (×2): 1 via ORAL
  Administered 2012-09-25 – 2012-09-27 (×4): 2 via ORAL
  Filled 2012-09-10 (×4): qty 2
  Filled 2012-09-10: qty 1
  Filled 2012-09-10 (×2): qty 2
  Filled 2012-09-10 (×2): qty 1
  Filled 2012-09-10 (×2): qty 2
  Filled 2012-09-10: qty 1
  Filled 2012-09-10 (×2): qty 2
  Filled 2012-09-10: qty 1
  Filled 2012-09-10: qty 2
  Filled 2012-09-10: qty 1
  Filled 2012-09-10 (×5): qty 2

## 2012-09-10 MED ORDER — ARTIFICIAL TEARS OP OINT
TOPICAL_OINTMENT | OPHTHALMIC | Status: DC | PRN
  Administered 2012-09-10: 1 via OPHTHALMIC

## 2012-09-10 MED ORDER — FENTANYL CITRATE 0.05 MG/ML IJ SOLN
INTRAMUSCULAR | Status: DC | PRN
  Administered 2012-09-10 (×3): 50 ug via INTRAVENOUS

## 2012-09-10 MED ORDER — MORPHINE SULFATE 2 MG/ML IJ SOLN
1.0000 mg | INTRAMUSCULAR | Status: DC | PRN
  Administered 2012-09-11 – 2012-09-19 (×3): 1 mg via INTRAVENOUS
  Filled 2012-09-10 (×5): qty 1

## 2012-09-10 MED ORDER — POLYVINYL ALCOHOL 1.4 % OP SOLN
1.0000 [drp] | Freq: Three times a day (TID) | OPHTHALMIC | Status: DC
  Administered 2012-09-10 – 2012-10-01 (×73): 1 [drp] via OPHTHALMIC
  Filled 2012-09-10: qty 15

## 2012-09-10 MED ORDER — HYDROXYZINE HCL 25 MG PO TABS
25.0000 mg | ORAL_TABLET | Freq: Four times a day (QID) | ORAL | Status: DC | PRN
  Administered 2012-09-13 – 2012-09-29 (×14): 25 mg via ORAL
  Filled 2012-09-10 (×9): qty 1
  Filled 2012-09-10: qty 17
  Filled 2012-09-10 (×5): qty 1
  Filled 2012-09-10: qty 8

## 2012-09-10 MED ORDER — MAGIC MOUTHWASH
15.0000 mL | Freq: Three times a day (TID) | ORAL | Status: DC | PRN
  Filled 2012-09-10: qty 15

## 2012-09-10 MED ORDER — ACETAMINOPHEN 500 MG PO TABS
500.0000 mg | ORAL_TABLET | Freq: Four times a day (QID) | ORAL | Status: DC
  Administered 2012-09-10 – 2012-10-01 (×72): 500 mg via ORAL
  Filled 2012-09-10 (×101): qty 1

## 2012-09-10 MED ORDER — ONDANSETRON HCL 4 MG/2ML IJ SOLN: 4.0000 mg | Freq: Four times a day (QID) | INTRAMUSCULAR | Status: DC | PRN

## 2012-09-10 MED ORDER — ONDANSETRON HCL 4 MG/2ML IJ SOLN
INTRAMUSCULAR | Status: DC | PRN
  Administered 2012-09-10: 4 mg via INTRAVENOUS

## 2012-09-10 MED ORDER — METOCLOPRAMIDE HCL 10 MG PO TABS: 5.0000 mg | ORAL_TABLET | Freq: Three times a day (TID) | ORAL | Status: DC | PRN

## 2012-09-10 MED ORDER — HYPROMELLOSE (GONIOSCOPIC) 2.5 % OP SOLN: 1.0000 [drp] | Freq: Four times a day (QID) | OPHTHALMIC | Status: DC

## 2012-09-10 MED ORDER — LIDOCAINE HCL (CARDIAC) 20 MG/ML IV SOLN
INTRAVENOUS | Status: DC | PRN
  Administered 2012-09-10: 30 mg via INTRAVENOUS

## 2012-09-10 MED ORDER — BISACODYL 10 MG RE SUPP: 10.0000 mg | Freq: Every day | RECTAL | Status: DC | PRN

## 2012-09-10 MED ORDER — PHENYLEPHRINE HCL 10 MG/ML IJ SOLN
INTRAMUSCULAR | Status: DC | PRN
  Administered 2012-09-10: 80 ug via INTRAVENOUS
  Administered 2012-09-10 (×2): 40 ug via INTRAVENOUS
  Administered 2012-09-10: 80 ug via INTRAVENOUS

## 2012-09-10 MED ORDER — POTASSIUM CHLORIDE IN NACL 20-0.9 MEQ/L-% IV SOLN
INTRAVENOUS | Status: DC
  Administered 2012-09-10: 16:00:00 via INTRAVENOUS
  Administered 2012-09-12: 50 mL/h via INTRAVENOUS
  Administered 2012-09-15: 05:00:00 via INTRAVENOUS
  Administered 2012-09-20: 20 mL/h via INTRAVENOUS
  Filled 2012-09-10 (×16): qty 1000

## 2012-09-10 MED ORDER — GLYCOPYRROLATE 0.2 MG/ML IJ SOLN
INTRAMUSCULAR | Status: DC | PRN
  Administered 2012-09-10: 0.2 mg via INTRAVENOUS
  Administered 2012-09-10: 0.6 mg via INTRAVENOUS

## 2012-09-10 MED ORDER — LORAZEPAM 1 MG PO TABS
1.0000 mg | ORAL_TABLET | Freq: Three times a day (TID) | ORAL | Status: DC | PRN
  Administered 2012-09-11 – 2012-09-30 (×10): 1 mg via ORAL
  Filled 2012-09-10 (×10): qty 1

## 2012-09-10 MED ORDER — FUROSEMIDE 20 MG PO TABS
20.0000 mg | ORAL_TABLET | Freq: Every day | ORAL | Status: DC
  Filled 2012-09-10 (×2): qty 1

## 2012-09-10 MED ORDER — LORATADINE 10 MG PO TABS
10.0000 mg | ORAL_TABLET | Freq: Every day | ORAL | Status: DC
  Administered 2012-09-10 – 2012-10-01 (×20): 10 mg via ORAL
  Filled 2012-09-10 (×23): qty 1

## 2012-09-10 MED ORDER — MUPIROCIN 2 % EX OINT
TOPICAL_OINTMENT | CUTANEOUS | Status: AC
  Filled 2012-09-10: qty 22

## 2012-09-10 MED ORDER — PROPOFOL 10 MG/ML IV BOLUS
INTRAVENOUS | Status: DC | PRN
  Administered 2012-09-10: 100 mg via INTRAVENOUS

## 2012-09-10 MED ORDER — AMIODARONE HCL 100 MG PO TABS
100.0000 mg | ORAL_TABLET | Freq: Every day | ORAL | Status: DC
  Administered 2012-09-10 – 2012-10-01 (×22): 100 mg via ORAL
  Filled 2012-09-10 (×22): qty 1

## 2012-09-10 MED ORDER — DIPHENHYDRAMINE HCL 25 MG PO CAPS
25.0000 mg | ORAL_CAPSULE | Freq: Four times a day (QID) | ORAL | Status: DC | PRN
  Administered 2012-09-11 – 2012-09-13 (×5): 25 mg via ORAL
  Filled 2012-09-10 (×5): qty 1

## 2012-09-10 MED ORDER — VANCOMYCIN HCL 1000 MG IV SOLR
2000.0000 mg | Freq: Once | INTRAVENOUS | Status: AC
  Administered 2012-09-10: 2000 mg via INTRAVENOUS
  Filled 2012-09-10: qty 2000

## 2012-09-10 MED ORDER — VITAMIN C 500 MG PO TABS
500.0000 mg | ORAL_TABLET | Freq: Two times a day (BID) | ORAL | Status: DC
  Administered 2012-09-10 – 2012-10-01 (×40): 500 mg via ORAL
  Filled 2012-09-10 (×47): qty 1

## 2012-09-10 MED ORDER — METOCLOPRAMIDE HCL 5 MG/ML IJ SOLN: 5.0000 mg | Freq: Three times a day (TID) | INTRAMUSCULAR | Status: DC | PRN

## 2012-09-10 MED ORDER — VANCOMYCIN HCL IN DEXTROSE 1-5 GM/200ML-% IV SOLN
1000.0000 mg | Freq: Two times a day (BID) | INTRAVENOUS | Status: DC
  Administered 2012-09-11 – 2012-09-13 (×6): 1000 mg via INTRAVENOUS
  Filled 2012-09-10 (×7): qty 200

## 2012-09-10 MED ORDER — 0.9 % SODIUM CHLORIDE (POUR BTL) OPTIME
TOPICAL | Status: DC | PRN
  Administered 2012-09-10: 1000 mL
  Administered 2012-09-10: 3000 mL

## 2012-09-10 MED ORDER — ONDANSETRON HCL 4 MG PO TABS: 4.0000 mg | ORAL_TABLET | Freq: Four times a day (QID) | ORAL | Status: DC | PRN

## 2012-09-10 MED ORDER — SENNA 8.6 MG PO TABS
1.0000 | ORAL_TABLET | Freq: Every day | ORAL | Status: DC
  Administered 2012-09-10 – 2012-09-16 (×6): 8.6 mg via ORAL
  Filled 2012-09-10 (×10): qty 1

## 2012-09-10 MED ORDER — MUPIROCIN 2 % EX OINT
TOPICAL_OINTMENT | Freq: Two times a day (BID) | CUTANEOUS | Status: DC
  Filled 2012-09-10: qty 22

## 2012-09-10 MED ORDER — ADULT MULTIVITAMIN W/MINERALS CH
1.0000 | ORAL_TABLET | Freq: Every day | ORAL | Status: DC
  Administered 2012-09-10 – 2012-10-01 (×20): 1 via ORAL
  Filled 2012-09-10 (×22): qty 1

## 2012-09-10 MED ORDER — SIMVASTATIN 40 MG PO TABS
40.0000 mg | ORAL_TABLET | Freq: Every day | ORAL | Status: DC
  Administered 2012-09-10: 40 mg via ORAL
  Filled 2012-09-10 (×2): qty 1

## 2012-09-10 SURGICAL SUPPLY — 66 items
BANDAGE ELASTIC 4 VELCRO ST LF (GAUZE/BANDAGES/DRESSINGS) ×3 IMPLANT
BANDAGE ELASTIC 6 VELCRO ST LF (GAUZE/BANDAGES/DRESSINGS) ×3 IMPLANT
BANDAGE ESMARK 6X9 LF (GAUZE/BANDAGES/DRESSINGS) ×2 IMPLANT
BANDAGE GAUZE ELAST BULKY 4 IN (GAUZE/BANDAGES/DRESSINGS) ×3 IMPLANT
BNDG COHESIVE 6X5 TAN STRL LF (GAUZE/BANDAGES/DRESSINGS) ×3 IMPLANT
BNDG ESMARK 6X9 LF (GAUZE/BANDAGES/DRESSINGS) ×3
BRUSH SCRUB DISP (MISCELLANEOUS) ×9 IMPLANT
CANISTER SUCTION 2500CC (MISCELLANEOUS) ×6 IMPLANT
CANISTER WOUND CARE 500ML ATS (WOUND CARE) ×3 IMPLANT
CLEANER TIP ELECTROSURG 2X2 (MISCELLANEOUS) ×3 IMPLANT
CLOTH BEACON ORANGE TIMEOUT ST (SAFETY) ×3 IMPLANT
COVER SURGICAL LIGHT HANDLE (MISCELLANEOUS) ×3 IMPLANT
CUFF TOURNIQUET SINGLE 18IN (TOURNIQUET CUFF) IMPLANT
CUFF TOURNIQUET SINGLE 24IN (TOURNIQUET CUFF) IMPLANT
CUFF TOURNIQUET SINGLE 34IN LL (TOURNIQUET CUFF) IMPLANT
DRAPE C-ARM 42X72 X-RAY (DRAPES) ×3 IMPLANT
DRAPE C-ARMOR (DRAPES) IMPLANT
DRAPE OEC MINIVIEW 54X84 (DRAPES) IMPLANT
DRAPE U-SHAPE 47X51 STRL (DRAPES) ×3 IMPLANT
DRSG ADAPTIC 3X8 NADH LF (GAUZE/BANDAGES/DRESSINGS) ×3 IMPLANT
DRSG VAC ATS MED SENSATRAC (GAUZE/BANDAGES/DRESSINGS) ×3 IMPLANT
ELECT REM PT RETURN 9FT ADLT (ELECTROSURGICAL) ×3
ELECTRODE REM PT RTRN 9FT ADLT (ELECTROSURGICAL) ×2 IMPLANT
EVACUATOR 1/8 PVC DRAIN (DRAIN) IMPLANT
GLOVE BIO SURGEON STRL SZ7.5 (GLOVE) ×3 IMPLANT
GLOVE BIO SURGEON STRL SZ8 (GLOVE) ×3 IMPLANT
GLOVE BIO SURGEON STRL SZ8.5 (GLOVE) ×6 IMPLANT
GLOVE BIOGEL PI IND STRL 7.5 (GLOVE) ×2 IMPLANT
GLOVE BIOGEL PI IND STRL 8 (GLOVE) ×2 IMPLANT
GLOVE BIOGEL PI INDICATOR 7.5 (GLOVE) ×1
GLOVE BIOGEL PI INDICATOR 8 (GLOVE) ×1
GLOVE ECLIPSE 8.5 STRL (GLOVE) ×3 IMPLANT
GOWN PREVENTION PLUS XLARGE (GOWN DISPOSABLE) ×3 IMPLANT
GOWN PREVENTION PLUS XXLARGE (GOWN DISPOSABLE) ×3 IMPLANT
GOWN STRL NON-REIN LRG LVL3 (GOWN DISPOSABLE) ×3 IMPLANT
KIT BASIN OR (CUSTOM PROCEDURE TRAY) ×3 IMPLANT
KIT ROOM TURNOVER OR (KITS) ×3 IMPLANT
MANIFOLD NEPTUNE II (INSTRUMENTS) IMPLANT
NEEDLE 22X1 1/2 (OR ONLY) (NEEDLE) IMPLANT
NS IRRIG 1000ML POUR BTL (IV SOLUTION) ×3 IMPLANT
PACK ORTHO EXTREMITY (CUSTOM PROCEDURE TRAY) ×3 IMPLANT
PAD ARMBOARD 7.5X6 YLW CONV (MISCELLANEOUS) ×3 IMPLANT
PAD CAST 4YDX4 CTTN HI CHSV (CAST SUPPLIES) ×4 IMPLANT
PADDING CAST COTTON 4X4 STRL (CAST SUPPLIES) ×2
PADDING CAST COTTON 6X4 STRL (CAST SUPPLIES) ×6 IMPLANT
SET CYSTO W/LG BORE CLAMP LF (SET/KITS/TRAYS/PACK) ×3 IMPLANT
SPONGE GAUZE 4X4 12PLY (GAUZE/BANDAGES/DRESSINGS) ×3 IMPLANT
SPONGE LAP 18X18 X RAY DECT (DISPOSABLE) ×6 IMPLANT
SPONGE SCRUB IODOPHOR (GAUZE/BANDAGES/DRESSINGS) IMPLANT
STAPLER VISISTAT 35W (STAPLE) ×3 IMPLANT
STOCKINETTE IMPERVIOUS LG (DRAPES) IMPLANT
STRIP CLOSURE SKIN 1/2X4 (GAUZE/BANDAGES/DRESSINGS) IMPLANT
SUCTION FRAZIER TIP 10 FR DISP (SUCTIONS) ×3 IMPLANT
SUT ETHILON 3 0 PS 1 (SUTURE) ×3 IMPLANT
SUT PDS AB 2-0 CT1 27 (SUTURE) IMPLANT
SUT VIC AB 0 CT1 27 (SUTURE)
SUT VIC AB 0 CT1 27XBRD ANBCTR (SUTURE) IMPLANT
SUT VIC AB 2-0 CT1 27 (SUTURE)
SUT VIC AB 2-0 CT1 TAPERPNT 27 (SUTURE) IMPLANT
SYR CONTROL 10ML LL (SYRINGE) IMPLANT
TOWEL OR 17X24 6PK STRL BLUE (TOWEL DISPOSABLE) ×3 IMPLANT
TOWEL OR 17X26 10 PK STRL BLUE (TOWEL DISPOSABLE) ×6 IMPLANT
TUBE CONNECTING 12X1/4 (SUCTIONS) ×3 IMPLANT
UNDERPAD 30X30 INCONTINENT (UNDERPADS AND DIAPERS) ×3 IMPLANT
WATER STERILE IRR 1000ML POUR (IV SOLUTION) IMPLANT
YANKAUER SUCT BULB TIP NO VENT (SUCTIONS) ×3 IMPLANT
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
## 2012-09-10 NOTE — H&P (Signed)
I have seen and examined the patient. I agree with the findings above. I discussed with the patient and her daughters the risks and benefits of surgery, including the possibility of persistent infection, nerve injury, vessel injury, wound breakdown, arthritis, symptomatic hardware, DVT/ PE, loss of motion, and need for further surgery among others.  We also specifically discussed the elevated risk of soft tissue breakdown that could lead to amputation.  She and her family understood these risks and wished to proceed.   Budd Palmer, MD 09/10/2012 8:23 AM

## 2012-09-10 NOTE — Preoperative (Signed)
Beta Blockers   Reason not to administer Beta Blockers:Not Applicable 

## 2012-09-10 NOTE — Progress Notes (Signed)
Unable to insert foley cath--?? Cystocele..so, she has not had UA & Culture sent.Marland KitchenMarland KitchenDr Carola Frost made aware.   Also spoke with Dr. Noreene Larsson regarding the pacemaker (only a pacer).Marland KitchenMarland KitchenDA

## 2012-09-10 NOTE — Anesthesia Preprocedure Evaluation (Addendum)
Anesthesia Evaluation  Patient identified by MRN, date of birth, ID band Patient awake    Reviewed: Allergy & Precautions, H&P , NPO status , Patient's Chart, lab work & pertinent test results  Airway Mallampati: II TM Distance: >3 FB Neck ROM: Full    Dental  (+) Edentulous Upper and Dental Advisory Given   Pulmonary          Cardiovascular hypertension, Pt. on medications + CAD and + Cardiac Stents + dysrhythmias Atrial Fibrillation + pacemaker Rhythm:Regular Rate:Normal     Neuro/Psych negative neurological ROS  negative psych ROS   GI/Hepatic hiatal hernia,   Endo/Other  diabetes  Renal/GU      Musculoskeletal   Abdominal   Peds  Hematology   Anesthesia Other Findings   Reproductive/Obstetrics                          Anesthesia Physical Anesthesia Plan  ASA: IV  Anesthesia Plan: General   Post-op Pain Management:    Induction: Intravenous  Airway Management Planned: Oral ETT  Additional Equipment:   Intra-op Plan:   Post-operative Plan: Extubation in OR  Informed Consent: I have reviewed the patients History and Physical, chart, labs and discussed the procedure including the risks, benefits and alternatives for the proposed anesthesia with the patient or authorized representative who has indicated his/her understanding and acceptance.   Dental advisory given  Plan Discussed with: Anesthesiologist, CRNA and Surgeon  Anesthesia Plan Comments:        Anesthesia Quick Evaluation

## 2012-09-10 NOTE — Anesthesia Procedure Notes (Signed)
Procedure Name: Intubation Date/Time: 09/10/2012 9:11 AM Performed by: Gayla Medicus Pre-anesthesia Checklist: Patient identified, Timeout performed, Emergency Drugs available, Suction available and Patient being monitored Patient Re-evaluated:Patient Re-evaluated prior to inductionOxygen Delivery Method: Circle system utilized Preoxygenation: Pre-oxygenation with 100% oxygen Intubation Type: IV induction Ventilation: Mask ventilation without difficulty Laryngoscope Size: Mac and 3 Grade View: Grade II Tube type: Oral Tube size: 7.5 mm Number of attempts: 1 Airway Equipment and Method: Stylet Placement Confirmation: ETT inserted through vocal cords under direct vision,  positive ETCO2 and breath sounds checked- equal and bilateral Secured at: 22 cm Tube secured with: Tape Dental Injury: Teeth and Oropharynx as per pre-operative assessment
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
## 2012-09-10 NOTE — Anesthesia Postprocedure Evaluation (Signed)
  Anesthesia Post-op Note  Patient: Jill Bowman  Procedure(s) Performed: Procedure(s) (LRB) with comments: REMOVAL EXTERNAL FIXATION LEG (Right) HARDWARE REMOVAL (Left) - HARDWARE REMOVAL LEFT TIBIA  Patient Location: PACU  Anesthesia Type: General  Level of Consciousness: awake, alert  and oriented  Airway and Oxygen Therapy: Patient Spontanous Breathing and Patient connected to nasal cannula oxygen  Post-op Pain: mild  Post-op Assessment: Post-op Vital signs reviewed and Patient's Cardiovascular Status Stable  Post-op Vital Signs: stable  Complications: No apparent anesthesia complications

## 2012-09-10 NOTE — Brief Op Note (Signed)
09/10/2012  10:34 AM  PATIENT:  [name]  78 y.o. female  PRE-OPERATIVE DIAGNOSIS:  INFECTED LEFT LEG/FRACTURED RIGHT OPEN PILON  POST-OPERATIVE DIAGNOSIS:  INFECTED LEFT LEG/FRACTURED RIGHT OPEN PILON  PROCEDURE:  Procedure(s) (LRB) with comments: REMOVAL EXTERNAL FIXATION LEG (Right) HARDWARE REMOVAL (Left) - HARDWARE REMOVAL LEFT TIBIA Removal of superficial implants right leg Debridement skin, subcutaneous tissue, and fascia Application wound vac right leg  SURGEON:  Surgeon(s) and Role:    * Budd Palmer, MD - Primary  PHYSICIAN ASSISTANT: Montez Morita, [facility]  ANESTHESIA:   general  EBL:  Total I/O In: 1000 [I.V.:1000] Out: 100 [Blood:100]  BLOOD ADMINISTERED:none  DRAINS: wound vac   LOCAL MEDICATIONS USED:  NONE  SPECIMEN:  Source of Specimen:  deep tissue around plate  DISPOSITION OF SPECIMEN:  micro  COUNTS:  YES  TOURNIQUET:  * No tourniquets in log *  DICTATION: .Other Dictation: Dictation Number [id number]  PLAN OF CARE: Admit to inpatient   PATIENT DISPOSITION:  PACU - hemodynamically stable.   Delay start of Pharmacological VTE agent (>24hrs) due to surgical blood loss or risk of bleeding: no

## 2012-09-10 NOTE — Transfer of Care (Signed)
Immediate Anesthesia Transfer of Care Note  Patient: Jill Bowman  Procedure(s) Performed: Procedure(s) (LRB) with comments: REMOVAL EXTERNAL FIXATION LEG (Right) HARDWARE REMOVAL (Left) - HARDWARE REMOVAL LEFT TIBIA  Patient Location: PACU  Anesthesia Type: General  Level of Consciousness: awake and alert   Airway & Oxygen Therapy: Patient Spontanous Breathing and Patient connected to face mask oxygen  Post-op Assessment: Report given to PACU RN and Post -op Vital signs reviewed and stable  Post vital signs: Reviewed and stable  Complications: No apparent anesthesia complications

## 2012-09-11 ENCOUNTER — Encounter (HOSPITAL_COMMUNITY): Payer: Self-pay | Admitting: General Practice

## 2012-09-11 DIAGNOSIS — L089 Local infection of the skin and subcutaneous tissue, unspecified: Secondary | ICD-10-CM

## 2012-09-11 DIAGNOSIS — N390 Urinary tract infection, site not specified: Secondary | ICD-10-CM

## 2012-09-11 DIAGNOSIS — M869 Osteomyelitis, unspecified: Secondary | ICD-10-CM

## 2012-09-11 HISTORY — DX: Urinary tract infection, site not specified: N39.0

## 2012-09-11 HISTORY — DX: Osteomyelitis, unspecified: M86.9

## 2012-09-11 LAB — CBC WITH DIFFERENTIAL/PLATELET
Basophils Absolute: 0 10*3/uL (ref 0.0–0.1)
Eosinophils Absolute: 0.7 10*3/uL (ref 0.0–0.7)
Lymphocytes Relative: 27 % (ref 12–46)
Lymphs Abs: 2.1 10*3/uL (ref 0.7–4.0)
MCH: 31.3 pg (ref 26.0–34.0)
Neutrophils Relative %: 55 % (ref 43–77)
Platelets: 234 10*3/uL (ref 150–400)
RBC: 3.64 MIL/uL — ABNORMAL LOW (ref 3.87–5.11)
WBC: 7.9 10*3/uL (ref 4.0–10.5)

## 2012-09-11 LAB — COMPREHENSIVE METABOLIC PANEL
BUN: 16 mg/dL (ref 6–23)
CO2: 30 mEq/L (ref 19–32)
Chloride: 99 mEq/L (ref 96–112)
Creatinine, Ser: 0.79 mg/dL (ref 0.50–1.10)
GFR calc non Af Amer: 76 mL/min — ABNORMAL LOW (ref >90)
Glucose, Bld: 76 mg/dL (ref 70–99)
Total Bilirubin: 0.4 mg/dL (ref 0.3–1.2)

## 2012-09-11 LAB — GLUCOSE, CAPILLARY: Glucose-Capillary: 83 mg/dL (ref 70–99)

## 2012-09-11 MED ORDER — FENTANYL CITRATE 0.05 MG/ML IJ SOLN: 50.0000 ug | INTRAMUSCULAR | Status: DC | PRN

## 2012-09-11 MED ORDER — MIDAZOLAM HCL 2 MG/2ML IJ SOLN: 1.0000 mg | INTRAMUSCULAR | Status: DC | PRN

## 2012-09-11 MED ORDER — LACTATED RINGERS IV SOLN
INTRAVENOUS | Status: DC
  Administered 2012-09-11: 22:00:00 via INTRAVENOUS

## 2012-09-11 MED ORDER — ATORVASTATIN CALCIUM 20 MG PO TABS
20.0000 mg | ORAL_TABLET | Freq: Every day | ORAL | Status: DC
  Administered 2012-09-11 – 2012-09-28 (×18): 20 mg via ORAL
  Filled 2012-09-11 (×21): qty 1

## 2012-09-11 MED ORDER — POTASSIUM CHLORIDE CRYS ER 20 MEQ PO TBCR
20.0000 meq | EXTENDED_RELEASE_TABLET | Freq: Three times a day (TID) | ORAL | Status: DC
  Administered 2012-09-11 – 2012-09-12 (×5): 20 meq via ORAL
  Filled 2012-09-11 (×10): qty 1

## 2012-09-11 MED FILL — Mupirocin Oint 2%: CUTANEOUS | Qty: 22 | Status: AC

## 2012-09-11 NOTE — Progress Notes (Signed)
PT CANCELLATION NOTE 09/11/2012  PT session cancelled secondary to pt having too much pain to participate.  Pt schedule for return to OR tomorrow. Pt was dependent for slide board transfers and NWB on bilateral LEs.  Pt to return to SNF upon discharge from hospital (Friday?). Suggesting pt continue PT in SNF setting.  Will defer PT treatment to SNF.  Acute PT signing off.    Avree Szczygiel L. Manasvi Dickard DPT [phone]

## 2012-09-11 NOTE — Progress Notes (Signed)
Bladder scanned patient due to urinary incontinence. Bladder scan indicated 802 mL in bladder. Notified Montez Morita, PA on call. Ok to to in/out patient per Montez Morita. Will continue to monitor patient.
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
## 2012-09-11 NOTE — Progress Notes (Signed)
Orthopedic Tech Progress Note Patient Details:  Jill Bowman [birth year] [id number]  Patient ID: [name], female   DOB: Apr 23, 1929, 78 y.o.   MRN: [id number]   Shawnie Pons 09/11/2012, 10:00 AM Trapeze bar

## 2012-09-11 NOTE — Progress Notes (Signed)
In and out cath was unsuccessful. Patient was able to void after attempt. Patient is incontinent but voided a large amount of urine twice. Will continue to monitor patient.

## 2012-09-11 NOTE — Progress Notes (Signed)
Utilization review completed. Allenmichael Mcpartlin, RN, BSN. 

## 2012-09-11 NOTE — Progress Notes (Signed)
OT Cancellation Note  Patient Details Name: Jill Bowman MRN: [id number] DOB: 08-Sep-1929   Cancelled Treatment:    Reason Eval/Treat Not Completed: Patient not medically ready  Per discussion with PT pt in a lot of pain currently and plan to go back to OR tomorrow for more I & D.  She was at a SNF prior to admission and plans to return back there after leaving the hospital.  Will defer OT evaluation to SNF once pt returns.  Mirah Nevins Pager number [phone] 09/11/2012, 1:44 PM

## 2012-09-11 NOTE — Progress Notes (Signed)
Spoke to [place], [place] regarding diet order and 10/16 morning dose of Lovenox. Ok to give morning dose of Lovenox and ok to enter carb modified diet for patient.
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
## 2012-09-12 ENCOUNTER — Encounter (HOSPITAL_COMMUNITY)
Admission: RE | Disposition: A | Discharge status: Skilled Nursing Facility | Payer: Self-pay | Source: Ambulatory Visit | Attending: Orthopedic Surgery

## 2012-09-12 ENCOUNTER — Encounter (HOSPITAL_COMMUNITY): Payer: Self-pay | Admitting: Anesthesiology

## 2012-09-12 ENCOUNTER — Inpatient Hospital Stay (HOSPITAL_COMMUNITY): Payer: Medicare Other | Admitting: Anesthesiology

## 2012-09-12 HISTORY — PX: I&D EXTREMITY: SHX5045

## 2012-09-12 LAB — URINE MICROSCOPIC-ADD ON

## 2012-09-12 LAB — CBC
MCV: 92.8 fL (ref 78.0–100.0)
Platelets: 236 10*3/uL (ref 150–400)
RDW: 14.8 % (ref 11.5–15.5)
WBC: 9.9 10*3/uL (ref 4.0–10.5)

## 2012-09-12 LAB — BASIC METABOLIC PANEL
Calcium: 8.6 mg/dL (ref 8.4–10.5)
Chloride: 103 mEq/L (ref 96–112)
Chloride: 104 mEq/L (ref 96–112)
Creatinine, Ser: 0.74 mg/dL (ref 0.50–1.10)
Creatinine, Ser: 0.76 mg/dL (ref 0.50–1.10)
GFR calc Af Amer: 89 mL/min — ABNORMAL LOW (ref >90)
GFR calc Af Amer: >90 mL/min (ref >90)

## 2012-09-12 LAB — URINALYSIS, ROUTINE W REFLEX MICROSCOPIC
Bilirubin Urine: NEGATIVE
Ketones, ur: NEGATIVE mg/dL
Nitrite: NEGATIVE
Urobilinogen, UA: 0.2 mg/dL (ref 0.0–1.0)
pH: 6 (ref 5.0–8.0)

## 2012-09-12 LAB — WOUND CULTURE: Gram Stain: NONE SEEN

## 2012-09-12 LAB — PTH, INTACT AND CALCIUM
Calcium, Total (PTH): 8.2 mg/dL — ABNORMAL LOW (ref 8.4–10.5)
PTH: 16.7 pg/mL (ref 14.0–72.0)

## 2012-09-12 LAB — GLUCOSE, CAPILLARY: Glucose-Capillary: 86 mg/dL (ref 70–99)

## 2012-09-12 SURGERY — IRRIGATION AND DEBRIDEMENT EXTREMITY
Anesthesia: General | Site: Leg Lower | Laterality: Left | Wound class: Dirty or Infected

## 2012-09-12 MED ORDER — LIDOCAINE HCL (CARDIAC) 20 MG/ML IV SOLN
INTRAVENOUS | Status: DC | PRN
  Administered 2012-09-12: 100 mg via INTRAVENOUS

## 2012-09-12 MED ORDER — OXYCODONE HCL 5 MG/5ML PO SOLN: 5.0000 mg | Freq: Once | ORAL | Status: AC | PRN

## 2012-09-12 MED ORDER — PROPOFOL 10 MG/ML IV BOLUS
INTRAVENOUS | Status: DC | PRN
  Administered 2012-09-12: 100 mg via INTRAVENOUS

## 2012-09-12 MED ORDER — MEPERIDINE HCL 25 MG/ML IJ SOLN: 6.2500 mg | INTRAMUSCULAR | Status: DC | PRN

## 2012-09-12 MED ORDER — FENTANYL CITRATE 0.05 MG/ML IJ SOLN
INTRAMUSCULAR | Status: DC | PRN
  Administered 2012-09-12: 100 ug via INTRAVENOUS
  Administered 2012-09-12 (×2): 25 ug via INTRAVENOUS

## 2012-09-12 MED ORDER — ONDANSETRON HCL 4 MG/2ML IJ SOLN: 4.0000 mg | Freq: Once | INTRAMUSCULAR | Status: AC | PRN

## 2012-09-12 MED ORDER — OXYCODONE HCL 5 MG PO TABS: 5.0000 mg | ORAL_TABLET | Freq: Once | ORAL | Status: AC | PRN

## 2012-09-12 MED ORDER — SODIUM CHLORIDE 0.9 % IR SOLN
Status: DC | PRN
  Administered 2012-09-12: 6000 mL

## 2012-09-12 MED ORDER — ONDANSETRON HCL 4 MG/2ML IJ SOLN
INTRAMUSCULAR | Status: DC | PRN
  Administered 2012-09-12: 4 mg via INTRAVENOUS

## 2012-09-12 MED ORDER — PHENYLEPHRINE HCL 10 MG/ML IJ SOLN
INTRAMUSCULAR | Status: DC | PRN
  Administered 2012-09-12: 80 ug via INTRAVENOUS
  Administered 2012-09-12: 40 ug via INTRAVENOUS
  Administered 2012-09-12: 80 ug via INTRAVENOUS
  Administered 2012-09-12: 40 ug via INTRAVENOUS

## 2012-09-12 MED ORDER — HYDROMORPHONE HCL PF 1 MG/ML IJ SOLN: 0.2500 mg | INTRAMUSCULAR | Status: DC | PRN

## 2012-09-12 MED ORDER — ENOXAPARIN SODIUM 40 MG/0.4ML ~~LOC~~ SOLN
40.0000 mg | SUBCUTANEOUS | Status: DC
  Administered 2012-09-12 – 2012-09-30 (×18): 40 mg via SUBCUTANEOUS
  Filled 2012-09-12 (×21): qty 0.4

## 2012-09-12 MED ORDER — FUROSEMIDE 20 MG PO TABS
20.0000 mg | ORAL_TABLET | Freq: Every day | ORAL | Status: DC
  Administered 2012-09-12 – 2012-10-01 (×17): 20 mg via ORAL
  Filled 2012-09-12 (×20): qty 1

## 2012-09-12 MED ORDER — LACTATED RINGERS IV SOLN
INTRAVENOUS | Status: DC | PRN
  Administered 2012-09-12: 08:00:00 via INTRAVENOUS

## 2012-09-12 MED ORDER — HYDROCHLOROTHIAZIDE 25 MG PO TABS
25.0000 mg | ORAL_TABLET | Freq: Every day | ORAL | Status: DC
  Administered 2012-09-12 – 2012-09-20 (×8): 25 mg via ORAL
  Filled 2012-09-12 (×11): qty 1

## 2012-09-12 SURGICAL SUPPLY — 45 items
500ML CANISTER ×2 IMPLANT
BANDAGE GAUZE ELAST BULKY 4 IN (GAUZE/BANDAGES/DRESSINGS) ×4 IMPLANT
BLADE SURG 10 STRL SS (BLADE) ×2 IMPLANT
BNDG COHESIVE 4X5 TAN STRL (GAUZE/BANDAGES/DRESSINGS) IMPLANT
BNDG GAUZE STRTCH 6 (GAUZE/BANDAGES/DRESSINGS) ×6 IMPLANT
BRUSH SCRUB DISP (MISCELLANEOUS) ×4 IMPLANT
CLOTH BEACON ORANGE TIMEOUT ST (SAFETY) ×2 IMPLANT
COVER SURGICAL LIGHT HANDLE (MISCELLANEOUS) ×4 IMPLANT
DRAPE C-ARMOR (DRAPES) IMPLANT
DRAPE U-SHAPE 47X51 STRL (DRAPES) ×2 IMPLANT
DRSG ADAPTIC 3X8 NADH LF (GAUZE/BANDAGES/DRESSINGS) ×2 IMPLANT
DRSG MEPITEL 3X4 ME34 (GAUZE/BANDAGES/DRESSINGS) ×2 IMPLANT
DRSG VAC ATS MED SENSATRAC (GAUZE/BANDAGES/DRESSINGS) ×2 IMPLANT
ELECT CAUTERY BLADE 6.4 (BLADE) IMPLANT
ELECT REM PT RETURN 9FT ADLT (ELECTROSURGICAL) ×2
ELECTRODE REM PT RTRN 9FT ADLT (ELECTROSURGICAL) ×1 IMPLANT
GLOVE BIO SURGEON STRL SZ7.5 (GLOVE) ×2 IMPLANT
GLOVE BIO SURGEON STRL SZ8 (GLOVE) ×2 IMPLANT
GLOVE BIOGEL PI IND STRL 7.5 (GLOVE) ×1 IMPLANT
GLOVE BIOGEL PI IND STRL 8 (GLOVE) ×1 IMPLANT
GLOVE BIOGEL PI INDICATOR 7.5 (GLOVE) ×1
GLOVE BIOGEL PI INDICATOR 8 (GLOVE) ×1
GOWN PREVENTION PLUS XLARGE (GOWN DISPOSABLE) ×2 IMPLANT
GOWN STRL NON-REIN LRG LVL3 (GOWN DISPOSABLE) ×4 IMPLANT
HANDPIECE INTERPULSE COAX TIP (DISPOSABLE)
KIT BASIN OR (CUSTOM PROCEDURE TRAY) ×2 IMPLANT
KIT ROOM TURNOVER OR (KITS) ×2 IMPLANT
MANIFOLD NEPTUNE II (INSTRUMENTS) ×2 IMPLANT
NS IRRIG 1000ML POUR BTL (IV SOLUTION) ×2 IMPLANT
PACK ORTHO EXTREMITY (CUSTOM PROCEDURE TRAY) ×2 IMPLANT
PAD ARMBOARD 7.5X6 YLW CONV (MISCELLANEOUS) ×4 IMPLANT
PADDING CAST COTTON 6X4 STRL (CAST SUPPLIES) ×2 IMPLANT
SET CYSTO W/LG BORE CLAMP LF (SET/KITS/TRAYS/PACK) ×2 IMPLANT
SET HNDPC FAN SPRY TIP SCT (DISPOSABLE) IMPLANT
SPONGE GAUZE 4X4 12PLY (GAUZE/BANDAGES/DRESSINGS) ×2 IMPLANT
SPONGE LAP 18X18 X RAY DECT (DISPOSABLE) ×2 IMPLANT
STOCKINETTE IMPERVIOUS 9X36 MD (GAUZE/BANDAGES/DRESSINGS) IMPLANT
SUT PDS AB 2-0 CT1 27 (SUTURE) IMPLANT
TOWEL OR 17X24 6PK STRL BLUE (TOWEL DISPOSABLE) ×2 IMPLANT
TOWEL OR 17X26 10 PK STRL BLUE (TOWEL DISPOSABLE) ×4 IMPLANT
TUBE ANAEROBIC SPECIMEN COL (MISCELLANEOUS) IMPLANT
TUBE CONNECTING 12X1/4 (SUCTIONS) ×2 IMPLANT
UNDERPAD 30X30 INCONTINENT (UNDERPADS AND DIAPERS) ×2 IMPLANT
WATER STERILE IRR 1000ML POUR (IV SOLUTION) ×2 IMPLANT
YANKAUER SUCT BULB TIP NO VENT (SUCTIONS) ×2 IMPLANT

## 2012-09-12 NOTE — Clinical Social Work Psychosocial (Addendum)
    Clinical Social Work Department BRIEF PSYCHOSOCIAL ASSESSMENT 09/12/2012  Patient:  Jill Bowman, Jill Bowman     Account Number:  [id number]     Admit date:  09/10/2012  Clinical Social Worker:  Tiburcio Pea  Date/Time:  09/11/2012 10:30 AM  Referred by:  Physician  Date Referred:  09/10/2012 Referred for  SNF Placement   Other Referral:   Return to SNF   Interview type:  Patient Other interview type:    PSYCHOSOCIAL DATA Living Status:  FACILITY Admitted from facility:  [facility] Level of care:  Skilled Nursing Facility Primary support name:  Naylea Wigington Primary support relationship to patient:  CHILD, ADULT Degree of support available:   Strong support. This is a large, supportive family    CURRENT CONCERNS Current Concerns  Post-Acute Placement   Other Concerns:    SOCIAL WORK ASSESSMENT / PLAN 78 year old female- resident of [facility]. Met with patient this morning. She states that she wants to return to [facility] at d/c to resume her therapy. She is pleased with the  care provided at the SNF. Spoke with Crystal at [facility]- she stated that they will have  a bed available for patient when stable.  FL2 placed on chart for MD's signature.   Assessment/plan status:  Psychosocial Support/Ongoing Assessment of Needs Other assessment/ plan:   Information/referral to community resources:   None inicated    PATIENT'S/FAMILY'S RESPONSE TO PLAN OF CARE: Patient stated that she was feeling better and wants to return to SNF at d/c. She denied any current concerns of needs. Patient was appreciative of CSW's asisstance with her d/c plan.
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
## 2012-09-12 NOTE — Anesthesia Preprocedure Evaluation (Addendum)
Anesthesia Evaluation  Patient identified by MRN, date of birth, ID band Patient awake    Reviewed: Allergy & Precautions, H&P , NPO status , Patient's Chart, lab work & pertinent test results, reviewed documented beta blocker date and time   Airway Mallampati: II TM Distance: >3 FB Neck ROM: Full    Dental  (+) Edentulous Upper   Pulmonary shortness of breath, asthma , pneumonia -, resolved,    Pulmonary exam normal       Cardiovascular hypertension, + CAD and + Cardiac Stents + dysrhythmias + pacemaker Rhythm:Irregular Rate:Abnormal     Neuro/Psych    GI/Hepatic hiatal hernia,   Endo/Other  diabetes  Renal/GU      Musculoskeletal   Abdominal Normal abdominal exam  (+)   Peds  Hematology   Anesthesia Other Findings   Reproductive/Obstetrics                          Anesthesia Physical Anesthesia Plan  ASA: IV  Anesthesia Plan: General   Post-op Pain Management:    Induction: Intravenous  Airway Management Planned: Oral ETT  Additional Equipment:   Intra-op Plan:   Post-operative Plan: Extubation in OR  Informed Consent: I have reviewed the patients History and Physical, chart, labs and discussed the procedure including the risks, benefits and alternatives for the proposed anesthesia with the patient or authorized representative who has indicated his/her understanding and acceptance.   Dental advisory given  Plan Discussed with: CRNA, Anesthesiologist and Surgeon  Anesthesia Plan Comments:         Anesthesia Quick Evaluation

## 2012-09-12 NOTE — Preoperative (Signed)
Beta Blockers   Reason not to administer Beta Blockers:Not Applicable 

## 2012-09-12 NOTE — Brief Op Note (Signed)
09/10/2012 - 09/12/2012  9:11 AM  PATIENT:  Jill Bowman  78 y.o. female  PRE-OPERATIVE DIAGNOSIS:  INFECTED LEFT LEG  POST-OPERATIVE DIAGNOSIS:  infected left leg  PROCEDURE:  Procedure(s) (LRB) with comments: IRRIGATION AND DEBRIDEMENT EXTREMITY (Left) - I&D LEFT LEG  SURGEON:  Surgeon(s) and Role:    * Budd Palmer, MD - Primary  PHYSICIAN ASSISTANT: Montez Morita, [facility]  ANESTHESIA:   general  EBL:  Total I/O In: -  Out: 20 [Blood:20]  BLOOD ADMINISTERED:none  DRAINS: wound vac   LOCAL MEDICATIONS USED:  NONE  SPECIMEN:  No Specimen  DISPOSITION OF SPECIMEN:  N/A  COUNTS:  YES  TOURNIQUET:  * No tourniquets in log *  DICTATION: .Other Dictation: Dictation Number [id number]  PLAN OF CARE: Admit to inpatient   PATIENT DISPOSITION:  PACU - hemodynamically stable.   Delay start of Pharmacological VTE agent (>24hrs) due to surgical blood loss or risk of bleeding: no

## 2012-09-12 NOTE — Anesthesia Postprocedure Evaluation (Signed)
  Anesthesia Post-op Note  Patient: Jill Bowman  Procedure(s) Performed: Procedure(s) (LRB) with comments: IRRIGATION AND DEBRIDEMENT EXTREMITY (Left) - I&D LEFT LEG  Patient Location: PACU  Anesthesia Type: General  Level of Consciousness: awake, alert  and oriented  Airway and Oxygen Therapy: Patient Spontanous Breathing  Post-op Pain: none  Post-op Assessment: Post-op Vital signs reviewed and Patient's Cardiovascular Status Stable  Post-op Vital Signs: Reviewed and stable  Complications: No apparent anesthesia complications

## 2012-09-12 NOTE — Transfer of Care (Signed)
Immediate Anesthesia Transfer of Care Note  Patient: Jill Bowman  Procedure(s) Performed: Procedure(s) (LRB) with comments: IRRIGATION AND DEBRIDEMENT EXTREMITY (Left) - I&D LEFT LEG  Patient Location: PACU  Anesthesia Type: General  Level of Consciousness: awake, alert  and oriented  Airway & Oxygen Therapy: Patient Spontanous Breathing  Post-op Assessment: Report given to PACU RN  Post vital signs: Reviewed and stable  Complications: No apparent anesthesia complications

## 2012-09-12 NOTE — Progress Notes (Signed)
I have seen and examined the patient. I agree with the findings above.  Budd Palmer, MD 09/12/2012 9:07 AM

## 2012-09-13 ENCOUNTER — Encounter (HOSPITAL_COMMUNITY): Payer: Self-pay | Admitting: Orthopedic Surgery

## 2012-09-13 DIAGNOSIS — E876 Hypokalemia: Secondary | ICD-10-CM

## 2012-09-13 DIAGNOSIS — E871 Hypo-osmolality and hyponatremia: Secondary | ICD-10-CM

## 2012-09-13 LAB — URINE CULTURE: Colony Count: >=100000

## 2012-09-13 LAB — BASIC METABOLIC PANEL
CO2: 26 mEq/L (ref 19–32)
Chloride: 105 mEq/L (ref 96–112)
Creatinine, Ser: 0.76 mg/dL (ref 0.50–1.10)
Potassium: 4.5 mEq/L (ref 3.5–5.1)

## 2012-09-13 LAB — CBC
MCV: 93.4 fL (ref 78.0–100.0)
Platelets: 240 10*3/uL (ref 150–400)
RBC: 3.78 MIL/uL — ABNORMAL LOW (ref 3.87–5.11)
WBC: 8.9 10*3/uL (ref 4.0–10.5)

## 2012-09-13 LAB — VANCOMYCIN, TROUGH: Vancomycin Tr: 37.1 ug/mL (ref 10.0–20.0)

## 2012-09-13 MED ORDER — PRO-STAT SUGAR FREE PO LIQD
30.0000 mL | Freq: Three times a day (TID) | ORAL | Status: DC
  Administered 2012-09-13 – 2012-09-18 (×11): 30 mL via ORAL
  Filled 2012-09-13 (×18): qty 30

## 2012-09-13 MED ORDER — BOOST / RESOURCE BREEZE PO LIQD
1.0000 | Freq: Three times a day (TID) | ORAL | Status: DC
  Administered 2012-09-13 – 2012-09-18 (×13): 1 via ORAL
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
## 2012-09-13 NOTE — Progress Notes (Signed)
Per MD note- patient will undergo further surgery on Monday. Anticipate d/c back to [facility] by Tuesday or Wednesday with a VAC.  Notified [facility] of above who will initiate process to obtain a vac for use at the SNF. CSW will continue to monitor for date of stabliity.  Lorri Frederick. [name]  [phone]

## 2012-09-13 NOTE — Progress Notes (Signed)
Critical value on Vanc trough of 37.1 called.  Montez Morita notified.  Pharmacy will hold this afternoon dose and reassess in am.  Doyle Askew, RN
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
## 2012-09-13 NOTE — Progress Notes (Addendum)
09/13/12 1500  Clinical Encounter Type  Visited With Patient and family together  Visit Type Initial    I visited with Jill Bowman and her daughter. She was in good spirits. I will follow up next week to visit her again. Vonzella Nipple

## 2012-09-14 LAB — BASIC METABOLIC PANEL
BUN: 9 mg/dL (ref 6–23)
CO2: 28 mEq/L (ref 19–32)
Chloride: 102 mEq/L (ref 96–112)
Glucose, Bld: 100 mg/dL — ABNORMAL HIGH (ref 70–99)
Potassium: 3.6 mEq/L (ref 3.5–5.1)
Sodium: 136 mEq/L (ref 135–145)

## 2012-09-14 LAB — TYPE AND SCREEN
Donor AG Type: NEGATIVE
Donor AG Type: NEGATIVE
Unit division: 0

## 2012-09-14 LAB — VITAMIN D 1,25 DIHYDROXY
Vitamin D2 1, 25 (OH)2: <8 pg/mL
Vitamin D3 1, 25 (OH)2: 22 pg/mL

## 2012-09-14 LAB — CBC
HCT: 29.2 % — ABNORMAL LOW (ref 36.0–46.0)
Hemoglobin: 9.7 g/dL — ABNORMAL LOW (ref 12.0–15.0)
RBC: 3.12 MIL/uL — ABNORMAL LOW (ref 3.87–5.11)
WBC: 7.6 10*3/uL (ref 4.0–10.5)

## 2012-09-14 MED ORDER — DIPHENHYDRAMINE HCL 25 MG PO CAPS
50.0000 mg | ORAL_CAPSULE | Freq: Four times a day (QID) | ORAL | Status: DC | PRN
  Administered 2012-09-14 – 2012-09-17 (×5): 50 mg via ORAL
  Administered 2012-09-18: 25 mg via ORAL
  Administered 2012-09-19 – 2012-09-20 (×2): 50 mg via ORAL
  Administered 2012-09-21: 25 mg via ORAL
  Administered 2012-09-21 – 2012-09-29 (×9): 50 mg via ORAL
  Administered 2012-09-30: 25 mg via ORAL
  Filled 2012-09-14: qty 1
  Filled 2012-09-14: qty 2
  Filled 2012-09-14: qty 1
  Filled 2012-09-14 (×6): qty 2
  Filled 2012-09-14: qty 1
  Filled 2012-09-14 (×8): qty 2

## 2012-09-14 MED ORDER — VANCOMYCIN HCL 1000 MG IV SOLR
1250.0000 mg | INTRAVENOUS | Status: DC
  Administered 2012-09-14 – 2012-09-23 (×10): 1250 mg via INTRAVENOUS
  Filled 2012-09-14 (×11): qty 1250

## 2012-09-14 NOTE — Progress Notes (Signed)
Seen in room 5N10   The main problem is itching although she has no rash.  electrolytes and WBC WNL  Plan: increase Benadryl  Discussed with ?daughter who is in room with her.  She is on vancomycin and I think allergy to that is unlikely.
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
## 2012-09-15 LAB — URINALYSIS, ROUTINE W REFLEX MICROSCOPIC
Bilirubin Urine: NEGATIVE
Glucose, UA: NEGATIVE mg/dL
Ketones, ur: NEGATIVE mg/dL
Nitrite: NEGATIVE
Specific Gravity, Urine: 1.013 (ref 1.005–1.030)
pH: 5.5 (ref 5.0–8.0)

## 2012-09-15 LAB — BASIC METABOLIC PANEL
CO2: 25 mEq/L (ref 19–32)
Calcium: 8.3 mg/dL — ABNORMAL LOW (ref 8.4–10.5)
Chloride: 103 mEq/L (ref 96–112)
Creatinine, Ser: 0.7 mg/dL (ref 0.50–1.10)
Glucose, Bld: 88 mg/dL (ref 70–99)

## 2012-09-15 LAB — URINE MICROSCOPIC-ADD ON

## 2012-09-15 LAB — ANAEROBIC CULTURE

## 2012-09-15 LAB — CBC
Hemoglobin: 10 g/dL — ABNORMAL LOW (ref 12.0–15.0)
MCH: 30.2 pg (ref 26.0–34.0)
MCV: 93.1 fL (ref 78.0–100.0)
RBC: 3.31 MIL/uL — ABNORMAL LOW (ref 3.87–5.11)
WBC: 8.8 10*3/uL (ref 4.0–10.5)

## 2012-09-15 LAB — GLUCOSE, CAPILLARY: Glucose-Capillary: 138 mg/dL — ABNORMAL HIGH (ref 70–99)

## 2012-09-15 NOTE — Progress Notes (Signed)
Seen in room #5N10 this afternoon  Afebrile, vs's stable, WBC and Electrolytes WNL  Pharmacy note reviewed with recommendation for more antibx for UTI  S: still itching, but she thinks less so than yesterday; no dysuria, no foul smell to urine  O: comfortable in bed  A: stable, poss asymptomatic UTI  P: repeat urine culture, defer initiation of antibx for UTI pending outcome

## 2012-09-16 ENCOUNTER — Inpatient Hospital Stay (HOSPITAL_COMMUNITY): Payer: Medicare Other

## 2012-09-16 ENCOUNTER — Encounter (HOSPITAL_COMMUNITY): Payer: Self-pay | Admitting: *Deleted

## 2012-09-16 ENCOUNTER — Encounter (HOSPITAL_COMMUNITY)
Admission: RE | Disposition: A | Discharge status: Skilled Nursing Facility | Payer: Self-pay | Source: Ambulatory Visit | Attending: Orthopedic Surgery

## 2012-09-16 ENCOUNTER — Encounter (HOSPITAL_COMMUNITY): Payer: Self-pay | Admitting: Anesthesiology

## 2012-09-16 ENCOUNTER — Encounter (HOSPITAL_COMMUNITY): Payer: Self-pay | Admitting: Orthopedic Surgery

## 2012-09-16 ENCOUNTER — Inpatient Hospital Stay (HOSPITAL_COMMUNITY): Payer: Medicare Other | Admitting: Anesthesiology

## 2012-09-16 DIAGNOSIS — D62 Acute posthemorrhagic anemia: Secondary | ICD-10-CM

## 2012-09-16 DIAGNOSIS — E46 Unspecified protein-calorie malnutrition: Secondary | ICD-10-CM

## 2012-09-16 DIAGNOSIS — E876 Hypokalemia: Secondary | ICD-10-CM

## 2012-09-16 HISTORY — PX: I&D EXTREMITY: SHX5045

## 2012-09-16 LAB — GLUCOSE, CAPILLARY: Glucose-Capillary: 85 mg/dL (ref 70–99)

## 2012-09-16 LAB — BASIC METABOLIC PANEL
CO2: 23 mEq/L (ref 19–32)
Chloride: 103 mEq/L (ref 96–112)
Glucose, Bld: 78 mg/dL (ref 70–99)
Sodium: 137 mEq/L (ref 135–145)

## 2012-09-16 LAB — CBC
Hemoglobin: 8.8 g/dL — ABNORMAL LOW (ref 12.0–15.0)
MCH: 31.2 pg (ref 26.0–34.0)
Platelets: 215 10*3/uL (ref 150–400)
RBC: 2.82 MIL/uL — ABNORMAL LOW (ref 3.87–5.11)
WBC: 6.5 10*3/uL (ref 4.0–10.5)

## 2012-09-16 SURGERY — IRRIGATION AND DEBRIDEMENT EXTREMITY
Anesthesia: General | Site: Leg Lower | Laterality: Left | Wound class: Dirty or Infected

## 2012-09-16 MED ORDER — LIDOCAINE HCL (CARDIAC) 20 MG/ML IV SOLN
INTRAVENOUS | Status: DC | PRN
  Administered 2012-09-16: 50 mg via INTRAVENOUS

## 2012-09-16 MED ORDER — PROPOFOL 10 MG/ML IV BOLUS
INTRAVENOUS | Status: DC | PRN
  Administered 2012-09-16: 110 mg via INTRAVENOUS
  Administered 2012-09-16: 20 mg via INTRAVENOUS

## 2012-09-16 MED ORDER — LACTATED RINGERS IV SOLN: INTRAVENOUS | Status: DC

## 2012-09-16 MED ORDER — ONDANSETRON HCL 4 MG/2ML IJ SOLN
INTRAMUSCULAR | Status: DC | PRN
  Administered 2012-09-16: 4 mg via INTRAVENOUS

## 2012-09-16 MED ORDER — SODIUM CHLORIDE 0.9 % IR SOLN
Status: DC | PRN
  Administered 2012-09-16: 1000 mL

## 2012-09-16 MED ORDER — ENOXAPARIN SODIUM 40 MG/0.4ML ~~LOC~~ SOLN: 40.0000 mg | SUBCUTANEOUS | Status: DC

## 2012-09-16 MED ORDER — LACTATED RINGERS IV SOLN
INTRAVENOUS | Status: DC
  Administered 2012-09-16: 50 mL/h via INTRAVENOUS

## 2012-09-16 MED ORDER — PHENYLEPHRINE HCL 10 MG/ML IJ SOLN
INTRAMUSCULAR | Status: DC | PRN
  Administered 2012-09-16 (×4): 40 ug via INTRAVENOUS

## 2012-09-16 MED ORDER — LACTATED RINGERS IV SOLN
INTRAVENOUS | Status: DC | PRN
  Administered 2012-09-16: 12:00:00 via INTRAVENOUS

## 2012-09-16 MED ORDER — FENTANYL CITRATE 0.05 MG/ML IJ SOLN
INTRAMUSCULAR | Status: DC | PRN
  Administered 2012-09-16 (×2): 50 ug via INTRAVENOUS
  Administered 2012-09-16: 25 ug via INTRAVENOUS
  Administered 2012-09-16: 50 ug via INTRAVENOUS
  Administered 2012-09-16: 25 ug via INTRAVENOUS

## 2012-09-16 MED ORDER — POTASSIUM CHLORIDE CRYS ER 20 MEQ PO TBCR
20.0000 meq | EXTENDED_RELEASE_TABLET | Freq: Two times a day (BID) | ORAL | Status: DC
  Administered 2012-09-16 – 2012-10-01 (×30): 20 meq via ORAL
  Filled 2012-09-16 (×33): qty 1

## 2012-09-16 MED ORDER — SODIUM CHLORIDE 0.9 % IV SOLN
1.0000 g | INTRAVENOUS | Status: DC
  Administered 2012-09-16 – 2012-09-25 (×10): 1 g via INTRAVENOUS
  Filled 2012-09-16 (×10): qty 1

## 2012-09-16 MED ORDER — LACTATED RINGERS IV SOLN
INTRAVENOUS | Status: DC
  Administered 2012-09-16: 12:00:00 via INTRAVENOUS

## 2012-09-16 SURGICAL SUPPLY — 47 items
BANDAGE GAUZE ELAST BULKY 4 IN (GAUZE/BANDAGES/DRESSINGS) ×4 IMPLANT
BLADE SURG 10 STRL SS (BLADE) ×2 IMPLANT
BNDG COHESIVE 4X5 TAN STRL (GAUZE/BANDAGES/DRESSINGS) ×2 IMPLANT
BNDG GAUZE STRTCH 6 (GAUZE/BANDAGES/DRESSINGS) ×6 IMPLANT
BRUSH SCRUB DISP (MISCELLANEOUS) ×4 IMPLANT
CLOTH BEACON ORANGE TIMEOUT ST (SAFETY) ×2 IMPLANT
CONNECTOR Y ATS VAC SYSTEM (MISCELLANEOUS) ×2 IMPLANT
COVER SURGICAL LIGHT HANDLE (MISCELLANEOUS) ×4 IMPLANT
DRAPE C-ARMOR (DRAPES) ×2 IMPLANT
DRAPE U-SHAPE 47X51 STRL (DRAPES) ×2 IMPLANT
DRSG ADAPTIC 3X8 NADH LF (GAUZE/BANDAGES/DRESSINGS) ×2 IMPLANT
DRSG VAC ATS MED SENSATRAC (GAUZE/BANDAGES/DRESSINGS) ×2 IMPLANT
ELECT CAUTERY BLADE 6.4 (BLADE) IMPLANT
ELECT REM PT RETURN 9FT ADLT (ELECTROSURGICAL)
ELECTRODE REM PT RTRN 9FT ADLT (ELECTROSURGICAL) IMPLANT
GLOVE BIO SURGEON STRL SZ7.5 (GLOVE) ×2 IMPLANT
GLOVE BIO SURGEON STRL SZ8 (GLOVE) ×2 IMPLANT
GLOVE BIOGEL PI IND STRL 7.5 (GLOVE) ×1 IMPLANT
GLOVE BIOGEL PI IND STRL 8 (GLOVE) ×1 IMPLANT
GLOVE BIOGEL PI INDICATOR 7.5 (GLOVE) ×1
GLOVE BIOGEL PI INDICATOR 8 (GLOVE) ×1
GOWN PREVENTION PLUS XLARGE (GOWN DISPOSABLE) ×2 IMPLANT
GOWN STRL NON-REIN LRG LVL3 (GOWN DISPOSABLE) ×4 IMPLANT
HANDPIECE INTERPULSE COAX TIP (DISPOSABLE)
KIT BASIN OR (CUSTOM PROCEDURE TRAY) ×2 IMPLANT
KIT ROOM TURNOVER OR (KITS) ×2 IMPLANT
MANIFOLD NEPTUNE II (INSTRUMENTS) ×2 IMPLANT
NS IRRIG 1000ML POUR BTL (IV SOLUTION) ×2 IMPLANT
PACK ORTHO EXTREMITY (CUSTOM PROCEDURE TRAY) ×2 IMPLANT
PAD ARMBOARD 7.5X6 YLW CONV (MISCELLANEOUS) ×4 IMPLANT
PADDING CAST COTTON 6X4 STRL (CAST SUPPLIES) ×2 IMPLANT
SET COLLECT BLD 21X3/4 12 PB (MISCELLANEOUS) ×2 IMPLANT
SET HNDPC FAN SPRY TIP SCT (DISPOSABLE) IMPLANT
SET IRRIG Y TYPE TUR BLADDER L (SET/KITS/TRAYS/PACK) ×2 IMPLANT
SPONGE GAUZE 4X4 12PLY (GAUZE/BANDAGES/DRESSINGS) ×2 IMPLANT
SPONGE LAP 18X18 X RAY DECT (DISPOSABLE) ×2 IMPLANT
STOCKINETTE IMPERVIOUS 9X36 MD (GAUZE/BANDAGES/DRESSINGS) ×2 IMPLANT
SUT ETHILON 2 0 FS 18 (SUTURE) ×2 IMPLANT
SUT PDS AB 2-0 CT1 27 (SUTURE) ×2 IMPLANT
TOWEL OR 17X24 6PK STRL BLUE (TOWEL DISPOSABLE) ×2 IMPLANT
TOWEL OR 17X26 10 PK STRL BLUE (TOWEL DISPOSABLE) ×4 IMPLANT
TUBE ANAEROBIC SPECIMEN COL (MISCELLANEOUS) IMPLANT
TUBE CONNECTING 12X1/4 (SUCTIONS) ×2 IMPLANT
TUBING VACUUM 7/8 X6' (MISCELLANEOUS) ×2 IMPLANT
UNDERPAD 30X30 INCONTINENT (UNDERPADS AND DIAPERS) ×2 IMPLANT
WATER STERILE IRR 1000ML POUR (IV SOLUTION) ×2 IMPLANT
YANKAUER SUCT BULB TIP NO VENT (SUCTIONS) ×2 IMPLANT

## 2012-09-16 NOTE — Transfer of Care (Signed)
Immediate Anesthesia Transfer of Care Note  Patient: Jill Bowman  Procedure(s) Performed: Procedure(s) (LRB) with comments: IRRIGATION AND DEBRIDEMENT EXTREMITY (Left) -  IRRIGATION AND DEBRIDEMENT EXTREMITY LEFT LEG  Patient Location: PACU  Anesthesia Type: General  Level of Consciousness: awake, oriented, patient cooperative and responds to stimulation  Airway & Oxygen Therapy: Patient Spontanous Breathing and Patient connected to face mask oxygen  Post-op Assessment: Report given to PACU RN and Post -op Vital signs reviewed and stable  Post vital signs: Reviewed and stable  Complications: No apparent anesthesia complications

## 2012-09-16 NOTE — Progress Notes (Signed)
09/16/12 1600  Clinical Encounter Type  Visited With Patient not available  Visit Type Follow-up    I followed up to visit patient and family. Patient's daughter said her mother was still in surgery. I will follow up to see her. Chaplain Vonzella Nipple

## 2012-09-16 NOTE — Anesthesia Postprocedure Evaluation (Signed)
Anesthesia Post Note  Patient: Jill Bowman  Procedure(s) Performed: Procedure(s) (LRB): IRRIGATION AND DEBRIDEMENT EXTREMITY (Left)  Anesthesia type: general  Patient location: PACU  Post pain: Pain level controlled  Post assessment: Patient's Cardiovascular Status Stable  Last Vitals:  Filed Vitals:   09/16/12 1445  BP: 122/43  Pulse: 74  Temp: 37.1 C  Resp: 14    Post vital signs: Reviewed and stable  Level of consciousness: sedated  Complications: No apparent anesthesia complications

## 2012-09-16 NOTE — Progress Notes (Signed)
Utilization review completed. Leibish Mcgregor, RN, BSN. 

## 2012-09-16 NOTE — Anesthesia Procedure Notes (Signed)
Procedure Name: LMA Insertion Date/Time: 09/16/2012 12:32 PM Performed by: Carmela Rima Pre-anesthesia Checklist: Patient identified, Timeout performed, Emergency Drugs available, Suction available and Patient being monitored Patient Re-evaluated:Patient Re-evaluated prior to inductionOxygen Delivery Method: Circle system utilized Preoxygenation: Pre-oxygenation with 100% oxygen Intubation Type: IV induction Ventilation: Mask ventilation without difficulty LMA: LMA inserted LMA Size: 4.0 Number of attempts: 1 Placement Confirmation: positive ETCO2 and breath sounds checked- equal and bilateral Tube secured with: Tape Dental Injury: Teeth and Oropharynx as per pre-operative assessment
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
## 2012-09-16 NOTE — Anesthesia Preprocedure Evaluation (Addendum)
Anesthesia Evaluation  Patient identified by MRN, date of birth, ID band Patient awake    Reviewed: Allergy & Precautions, H&P , NPO status , Patient's Chart, lab work & pertinent test results  Airway Mallampati: II  Neck ROM: full    Dental  (+) Teeth Intact and Dental Advidsory Given   Pulmonary shortness of breath, asthma ,          Cardiovascular hypertension, + CAD and + Cardiac Stents + dysrhythmias Atrial Fibrillation + pacemaker     Neuro/Psych    GI/Hepatic hiatal hernia,   Endo/Other  diabetes, Type 2obese  Renal/GU      Musculoskeletal   Abdominal   Peds  Hematology   Anesthesia Other Findings   Reproductive/Obstetrics                          Anesthesia Physical Anesthesia Plan  ASA: III  Anesthesia Plan: General   Post-op Pain Management:    Induction: Intravenous  Airway Management Planned: LMA  Additional Equipment:   Intra-op Plan:   Post-operative Plan:   Informed Consent: I have reviewed the patients History and Physical, chart, labs and discussed the procedure including the risks, benefits and alternatives for the proposed anesthesia with the patient or authorized representative who has indicated his/her understanding and acceptance.   Dental Advisory Given  Plan Discussed with: CRNA, Surgeon and Anesthesiologist  Anesthesia Plan Comments:        Anesthesia Quick Evaluation

## 2012-09-17 ENCOUNTER — Encounter (HOSPITAL_COMMUNITY): Payer: Self-pay | Admitting: Orthopedic Surgery

## 2012-09-17 LAB — VANCOMYCIN, TROUGH: Vancomycin Tr: 15.2 ug/mL (ref 10.0–20.0)

## 2012-09-17 LAB — BASIC METABOLIC PANEL
BUN: 15 mg/dL (ref 6–23)
Chloride: 104 mEq/L (ref 96–112)
GFR calc Af Amer: 89 mL/min — ABNORMAL LOW (ref >90)
GFR calc non Af Amer: 77 mL/min — ABNORMAL LOW (ref >90)
Potassium: 3.8 mEq/L (ref 3.5–5.1)
Sodium: 144 mEq/L (ref 135–145)

## 2012-09-17 LAB — CBC
MCHC: 33.3 g/dL (ref 30.0–36.0)
Platelets: 252 10*3/uL (ref 150–400)
RDW: 15.6 % — ABNORMAL HIGH (ref 11.5–15.5)
WBC: 7.2 10*3/uL (ref 4.0–10.5)

## 2012-09-17 NOTE — Brief Op Note (Signed)
09/10/2012 - 09/16/2012  8:16 AM  PATIENT:  Jill Bowman  78 y.o. female  PRE-OPERATIVE DIAGNOSIS:  INFECTION LEFT LEG  POST-OPERATIVE DIAGNOSIS:  INFECTION LEFT LEG  PROCEDURE:  Procedure(s) (LRB) with comments: IRRIGATION AND DEBRIDEMENT EXTREMITY (Left) -  IRRIGATION AND DEBRIDEMENT EXTREMITY LEFT LEG  SURGEON:  Surgeon(s) and Role:    * Budd Palmer, MD - Primary  PHYSICIAN ASSISTANT: Montez Morita, [facility]  ANESTHESIA:   general  EBL:   min  BLOOD ADMINISTERED:none  DRAINS: medium hemovac  LOCAL MEDICATIONS USED:  NONE  SPECIMEN:  No Specimen  DISPOSITION OF SPECIMEN:  N/A  COUNTS:  YES  TOURNIQUET:  * No tourniquets in log *  DICTATION: .Other Dictation: Dictation Number [id number]  PLAN OF CARE: Admit to inpatient   PATIENT DISPOSITION:  PACU - hemodynamically stable.   Delay start of Pharmacological VTE agent (>24hrs) due to surgical blood loss or risk of bleeding: no
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
## 2012-09-17 NOTE — Progress Notes (Signed)
Per Montez Morita, PA - patient will not be stable for d/c this week. She will be re-evaluated on Friday for possible need for more surgery.  Notified [facility]; they will be on standby to obtain a wound vac when patient is stable for d/c and will accept her back as well.  CSW will continue to monitor.  Lorri Frederick. [name]  [phone]  .
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
## 2012-09-18 LAB — BASIC METABOLIC PANEL
CO2: 24 mEq/L (ref 19–32)
Calcium: 8.4 mg/dL (ref 8.4–10.5)
Chloride: 105 mEq/L (ref 96–112)
Creatinine, Ser: 0.7 mg/dL (ref 0.50–1.10)
GFR calc Af Amer: >90 mL/min (ref >90)
Sodium: 139 mEq/L (ref 135–145)

## 2012-09-18 LAB — CBC
MCV: 95.4 fL (ref 78.0–100.0)
Platelets: 163 10*3/uL (ref 150–400)
RBC: 3.05 MIL/uL — ABNORMAL LOW (ref 3.87–5.11)
RDW: 15.5 % (ref 11.5–15.5)
WBC: 7 10*3/uL (ref 4.0–10.5)

## 2012-09-18 MED ORDER — BOOST / RESOURCE BREEZE PO LIQD
1.0000 | Freq: Every day | ORAL | Status: DC
  Administered 2012-09-19 – 2012-10-01 (×10): 1 via ORAL

## 2012-09-18 MED ORDER — VITAMIN D3 25 MCG (1000 UNIT) PO TABS
1000.0000 [IU] | ORAL_TABLET | Freq: Every day | ORAL | Status: DC
  Administered 2012-09-18 – 2012-10-01 (×13): 1000 [IU] via ORAL
  Filled 2012-09-18 (×14): qty 1

## 2012-09-18 MED ORDER — SODIUM CHLORIDE 0.9 % IJ SOLN
10.0000 mL | INTRAMUSCULAR | Status: DC | PRN
  Administered 2012-09-22 – 2012-10-01 (×10): 10 mL
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
## 2012-09-18 NOTE — Progress Notes (Signed)
Peripherally Inserted Central Catheter/Midline Placement  The IV Nurse has discussed with the patient and/or persons authorized to consent for the patient, the purpose of this procedure and the potential benefits and risks involved with this procedure.  The benefits include less needle sticks, lab draws from the catheter and patient may be discharged home with the catheter.  Risks include, but not limited to, infection, bleeding, blood clot (thrombus formation), and puncture of an artery; nerve damage and irregular heat beat.  Alternatives to this procedure were also discussed.  PICC/Midline Placement Documentation        Jill Bowman 09/18/2012, 1:26 PM Daughter in room also when procedure explained.

## 2012-09-18 NOTE — Progress Notes (Signed)
Nutrition Follow-up  Intervention:   1.  Meals/snacks; RD counseled pt on high-protein foods choices and will place order in chart for double protein portions since protein supplements are being discontinued. 2.  Nutrition-related medication; medications which may be contributing to diarrhea include scheduled Tylenol which contains sorbitol (pt received 4 doses yesterday), as well as schedule KCL potassium (pt received 2 doses yesterday).   Pt has also been on abx for several days and may benefit from initiation of prebiotic such as floraQ.  3.  Supplements; Prostat is sweetened with sucralose which is not likely contributing to diarrhea and pt has tolerated PTA as well as since admission (product does have high osmolality).  Recommend consider resuming if pt unable to increase protein intake at meals.  RD to decrease Resource Breeze to once daily as pt likely meeting estimated calorie needs.  Assessment:   Pt intake 75-100% of meals.  Increased diarrhea yesterday (5 documented episodes).  C. Diff negative. Pt has had one episode of diarrhea today.  Pt s/p I&D yesterday with removal of skin, muscle, and subcutaneous tissue and application of wound vac.  Diet Order:  Regular  Meds: Scheduled Meds:   . acetaminophen  500 mg Oral Q6H  . amiodarone  100 mg Oral Daily  . atorvastatin  20 mg Oral q1800  . cholecalciferol  1,000 Units Oral Daily  . enoxaparin (LOVENOX) injection  40 mg Subcutaneous Q24H  . ertapenem  1 g Intravenous Q24H  . feeding supplement  1 Container Oral TID BM  . furosemide  20 mg Oral Daily  . gabapentin  300 mg Oral QHS  . hydrochlorothiazide  25 mg Oral Daily  . loratadine  10 mg Oral Daily  . multivitamin with minerals  1 tablet Oral Daily  . polyvinyl alcohol  1 drop Both Eyes TID PC & HS  . potassium chloride  20 mEq Oral BID  . vancomycin  1,250 mg Intravenous Q24H  . vitamin C  500 mg Oral BID  . DISCONTD: feeding supplement  30 mL Oral TID WC  . DISCONTD:  senna  1 tablet Oral QHS   Continuous Infusions:   . 0.9 % NaCl with KCl 20 mEq / L 20 mL/hr at 09/15/12 0506  . DISCONTD: lactated ringers 50 mL/hr at 09/16/12 1140   PRN Meds:.bisacodyl, diphenhydrAMINE, HYDROcodone-acetaminophen, hydrOXYzine, LORazepam, magic mouthwash, metoCLOPramide (REGLAN) injection, metoCLOPramide, morphine injection, ondansetron (ZOFRAN) IV, ondansetron  Labs:  CMP     Component Value Date/Time   NA 139 09/18/2012 0947   K 3.5 09/18/2012 0947   CL 105 09/18/2012 0947   CO2 24 09/18/2012 0947   GLUCOSE 152* 09/18/2012 0947   BUN 17 09/18/2012 0947   CREATININE 0.70 09/18/2012 0947   CALCIUM 8.4 09/18/2012 0947   CALCIUM 8.2* 09/11/2012 0555   PROT 5.9* 09/11/2012 0555   ALBUMIN 2.7* 09/11/2012 0555   AST 20 09/11/2012 0555   ALT 8 09/11/2012 0555   ALKPHOS 93 09/11/2012 0555   BILITOT 0.4 09/11/2012 0555   GFRNONAA 79* 09/18/2012 0947   GFRAA >90 09/18/2012 0947   CBG (last 3)   Basename 09/16/12 1358 09/15/12 1620  GLUCAP 85 81    Intake/Output Summary (Last 24 hours) at 09/18/12 1054 Last data filed at 09/17/12 1700  Gross per 24 hour  Intake    100 ml  Output      0 ml  Net    100 ml    Weight Status:  No new wt  Re-estimated  needs:  1900-2100 kcal, 115-125g, >2.1 L/day  Nutrition Dx:  Increased nutrient needs, ongoing r/t wound healing  Monitor:   1. Food/Beverage; improvement in intake to >/=75% of estimated needs.  Met, ongoing.   2. Wt/wt change; monitor trends.  Continue.    Loyce Dys, MS RD LDN Clinical Inpatient Dietitian Pager: [phone] Weekend/After hours pager: [phone]
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
## 2012-09-19 NOTE — Progress Notes (Signed)
Utilization review completed. Selden Noteboom, RN, BSN. 

## 2012-09-19 NOTE — Progress Notes (Signed)
No change in d/c plan. Return to SNF [facility]) when stable per MD. Possible surgery tomorrow. CSW will monitor.  Lorri Frederick. [name]  [phone]

## 2012-09-20 ENCOUNTER — Encounter (HOSPITAL_COMMUNITY): Payer: Self-pay | Admitting: Anesthesiology

## 2012-09-20 ENCOUNTER — Encounter (HOSPITAL_COMMUNITY)
Admission: RE | Disposition: A | Discharge status: Skilled Nursing Facility | Payer: Self-pay | Source: Ambulatory Visit | Attending: Orthopedic Surgery

## 2012-09-20 ENCOUNTER — Encounter (HOSPITAL_COMMUNITY): Payer: Self-pay

## 2012-09-20 ENCOUNTER — Inpatient Hospital Stay (HOSPITAL_COMMUNITY): Payer: Medicare Other | Admitting: Anesthesiology

## 2012-09-20 HISTORY — PX: I&D EXTREMITY: SHX5045

## 2012-09-20 LAB — BASIC METABOLIC PANEL
BUN: 13 mg/dL (ref 6–23)
CO2: 25 mEq/L (ref 19–32)
Chloride: 105 mEq/L (ref 96–112)
Creatinine, Ser: 0.77 mg/dL (ref 0.50–1.10)
Glucose, Bld: 79 mg/dL (ref 70–99)
Potassium: 3.7 mEq/L (ref 3.5–5.1)

## 2012-09-20 LAB — CBC
HCT: 27.2 % — ABNORMAL LOW (ref 36.0–46.0)
Hemoglobin: 8.9 g/dL — ABNORMAL LOW (ref 12.0–15.0)
MCV: 93.8 fL (ref 78.0–100.0)
RBC: 2.9 MIL/uL — ABNORMAL LOW (ref 3.87–5.11)
WBC: 6.8 10*3/uL (ref 4.0–10.5)

## 2012-09-20 LAB — GLUCOSE, CAPILLARY: Glucose-Capillary: 94 mg/dL (ref 70–99)

## 2012-09-20 SURGERY — IRRIGATION AND DEBRIDEMENT EXTREMITY
Anesthesia: General | Site: Leg Lower | Laterality: Left | Wound class: Dirty or Infected

## 2012-09-20 MED ORDER — OXYCODONE HCL 5 MG PO TABS: 5.0000 mg | ORAL_TABLET | Freq: Once | ORAL | Status: AC | PRN

## 2012-09-20 MED ORDER — OXYCODONE HCL 5 MG/5ML PO SOLN: 5.0000 mg | Freq: Once | ORAL | Status: AC | PRN

## 2012-09-20 MED ORDER — SODIUM CHLORIDE 0.9 % IR SOLN
Status: DC | PRN
  Administered 2012-09-20: 1000 mL

## 2012-09-20 MED ORDER — PROPOFOL 10 MG/ML IV BOLUS
INTRAVENOUS | Status: DC | PRN
  Administered 2012-09-20: 170 mg via INTRAVENOUS

## 2012-09-20 MED ORDER — LIDOCAINE HCL (CARDIAC) 10 MG/ML IV SOLN
INTRAVENOUS | Status: DC | PRN
  Administered 2012-09-20: 50 mg via INTRAVENOUS

## 2012-09-20 MED ORDER — LACTATED RINGERS IV SOLN
INTRAVENOUS | Status: DC | PRN
  Administered 2012-09-20: 08:00:00 via INTRAVENOUS

## 2012-09-20 MED ORDER — FENTANYL CITRATE 0.05 MG/ML IJ SOLN
INTRAMUSCULAR | Status: DC | PRN
  Administered 2012-09-20 (×4): 50 ug via INTRAVENOUS

## 2012-09-20 MED ORDER — PHENYLEPHRINE HCL 10 MG/ML IJ SOLN
INTRAMUSCULAR | Status: DC | PRN
  Administered 2012-09-20: 80 ug via INTRAVENOUS

## 2012-09-20 MED ORDER — METOCLOPRAMIDE HCL 5 MG/ML IJ SOLN: 10.0000 mg | Freq: Once | INTRAMUSCULAR | Status: AC | PRN

## 2012-09-20 MED ORDER — HYDROMORPHONE HCL PF 1 MG/ML IJ SOLN: 0.2500 mg | INTRAMUSCULAR | Status: DC | PRN

## 2012-09-20 MED ORDER — ONDANSETRON HCL 4 MG/2ML IJ SOLN
INTRAMUSCULAR | Status: DC | PRN
  Administered 2012-09-20: 4 mg via INTRAVENOUS

## 2012-09-20 SURGICAL SUPPLY — 46 items
500ML CANISTER FOR INFO VAC ×2 IMPLANT
BANDAGE GAUZE ELAST BULKY 4 IN (GAUZE/BANDAGES/DRESSINGS) ×4 IMPLANT
BLADE SURG 10 STRL SS (BLADE) IMPLANT
BNDG COHESIVE 4X5 TAN STRL (GAUZE/BANDAGES/DRESSINGS) ×2 IMPLANT
BNDG GAUZE STRTCH 6 (GAUZE/BANDAGES/DRESSINGS) ×6 IMPLANT
BRUSH SCRUB DISP (MISCELLANEOUS) ×4 IMPLANT
CLOTH BEACON ORANGE TIMEOUT ST (SAFETY) ×2 IMPLANT
COVER SURGICAL LIGHT HANDLE (MISCELLANEOUS) ×2 IMPLANT
DRAPE C-ARMOR (DRAPES) IMPLANT
DRAPE U-SHAPE 47X51 STRL (DRAPES) ×2 IMPLANT
DRSG ADAPTIC 3X8 NADH LF (GAUZE/BANDAGES/DRESSINGS) ×2 IMPLANT
DRSG MEPILEX BORDER 4X4 (GAUZE/BANDAGES/DRESSINGS) ×2 IMPLANT
DRSG MEPITEL 4X7.2 (GAUZE/BANDAGES/DRESSINGS) ×2 IMPLANT
DRSG VAC ATS MED SENSATRAC (GAUZE/BANDAGES/DRESSINGS) ×2 IMPLANT
ELECT CAUTERY BLADE 6.4 (BLADE) IMPLANT
ELECT REM PT RETURN 9FT ADLT (ELECTROSURGICAL) ×2
ELECTRODE REM PT RTRN 9FT ADLT (ELECTROSURGICAL) ×1 IMPLANT
GLOVE BIO SURGEON STRL SZ7.5 (GLOVE) ×2 IMPLANT
GLOVE BIO SURGEON STRL SZ8 (GLOVE) ×2 IMPLANT
GLOVE BIOGEL PI IND STRL 7.5 (GLOVE) ×1 IMPLANT
GLOVE BIOGEL PI IND STRL 8 (GLOVE) ×1 IMPLANT
GLOVE BIOGEL PI INDICATOR 7.5 (GLOVE) ×1
GLOVE BIOGEL PI INDICATOR 8 (GLOVE) ×1
GOWN PREVENTION PLUS XLARGE (GOWN DISPOSABLE) ×2 IMPLANT
GOWN STRL NON-REIN LRG LVL3 (GOWN DISPOSABLE) ×4 IMPLANT
HANDPIECE INTERPULSE COAX TIP (DISPOSABLE) ×1
KIT BASIN OR (CUSTOM PROCEDURE TRAY) ×2 IMPLANT
KIT ROOM TURNOVER OR (KITS) ×2 IMPLANT
MANIFOLD NEPTUNE II (INSTRUMENTS) ×2 IMPLANT
NS IRRIG 1000ML POUR BTL (IV SOLUTION) ×2 IMPLANT
PACK ORTHO EXTREMITY (CUSTOM PROCEDURE TRAY) ×2 IMPLANT
PAD ARMBOARD 7.5X6 YLW CONV (MISCELLANEOUS) ×4 IMPLANT
PADDING CAST COTTON 6X4 STRL (CAST SUPPLIES) ×2 IMPLANT
SET HNDPC FAN SPRY TIP SCT (DISPOSABLE) ×1 IMPLANT
SPONGE GAUZE 4X4 12PLY (GAUZE/BANDAGES/DRESSINGS) ×2 IMPLANT
SPONGE LAP 18X18 X RAY DECT (DISPOSABLE) ×2 IMPLANT
STOCKINETTE IMPERVIOUS 9X36 MD (GAUZE/BANDAGES/DRESSINGS) ×2 IMPLANT
SUT ETHILON 3 0 PS 1 (SUTURE) ×2 IMPLANT
SUT PDS AB 2-0 CT1 27 (SUTURE) IMPLANT
TOWEL OR 17X24 6PK STRL BLUE (TOWEL DISPOSABLE) ×2 IMPLANT
TOWEL OR 17X26 10 PK STRL BLUE (TOWEL DISPOSABLE) ×4 IMPLANT
TUBE ANAEROBIC SPECIMEN COL (MISCELLANEOUS) IMPLANT
TUBE CONNECTING 12X1/4 (SUCTIONS) ×2 IMPLANT
UNDERPAD 30X30 INCONTINENT (UNDERPADS AND DIAPERS) ×2 IMPLANT
WATER STERILE IRR 1000ML POUR (IV SOLUTION) ×2 IMPLANT
YANKAUER SUCT BULB TIP NO VENT (SUCTIONS) ×2 IMPLANT

## 2012-09-20 NOTE — Transfer of Care (Signed)
Immediate Anesthesia Transfer of Care Note  Patient: Jill Bowman  Procedure(s) Performed: Procedure(s) (LRB) with comments: IRRIGATION AND DEBRIDEMENT EXTREMITY (Left)  Patient Location: PACU  Anesthesia Type: General  Level of Consciousness: awake, alert , oriented and patient cooperative  Airway & Oxygen Therapy: Patient Spontanous Breathing and Patient connected to nasal cannula oxygen  Post-op Assessment: Report given to PACU RN and Post -op Vital signs reviewed and stable  Post vital signs: Reviewed and stable  Complications: No apparent anesthesia complications

## 2012-09-20 NOTE — Brief Op Note (Signed)
09/10/2012 - 09/20/2012  9:43 AM  PATIENT:  Jill Bowman  78 y.o. female  PRE-OPERATIVE DIAGNOSIS:  infected left leg  POST-OPERATIVE DIAGNOSIS:  infected left leg  PROCEDURE:  Procedure(s) (LRB) with comments: IRRIGATION AND DEBRIDEMENT EXTREMITY (Left) of skin and subcutaneous tissue 2. Delayed closure of 5cm 3. Reapplication of medium wound vac 4. Application of short leg splint left 5. Application of short leg splint right  SURGEON:  Surgeon(s) and Role:    * Budd Palmer, MD - Primary  ANESTHESIA:   general  EBL:  Total I/O In: 200 [I.V.:200] Out: -   BLOOD ADMINISTERED:none  DRAINS: wound vac   LOCAL MEDICATIONS USED:  NONE  SPECIMEN:  No Specimen  DISPOSITION OF SPECIMEN:  N/A  COUNTS:  YES  TOURNIQUET:  * No tourniquets in log *  DICTATION: .Other Dictation: Dictation Number [id number]  PLAN OF CARE: Admit to inpatient   PATIENT DISPOSITION:  PACU - hemodynamically stable.   Delay start of Pharmacological VTE agent (>24hrs) due to surgical blood loss or risk of bleeding: no
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
## 2012-09-20 NOTE — Anesthesia Postprocedure Evaluation (Signed)
Anesthesia Post Note  Patient: Jill Bowman  Procedure(s) Performed: Procedure(s) (LRB): IRRIGATION AND DEBRIDEMENT EXTREMITY (Left)  Anesthesia type: general  Patient location: PACU  Post pain: Pain level controlled  Post assessment: Patient's Cardiovascular Status Stable  Last Vitals:  Filed Vitals:   09/20/12 1015  BP: 113/44  Pulse: 77  Temp:   Resp: 17    Post vital signs: Reviewed and stable  Level of consciousness: sedated  Complications: No apparent anesthesia complications

## 2012-09-20 NOTE — Anesthesia Preprocedure Evaluation (Addendum)
Anesthesia Evaluation  Patient identified by MRN, date of birth, ID band Patient awake    Reviewed: Allergy & Precautions, H&P , NPO status , Patient's Chart, lab work & pertinent test results, reviewed documented beta blocker date and time   History of Anesthesia Complications Negative for: history of anesthetic complications  Airway Mallampati: II TM Distance: >3 FB Neck ROM: full    Dental   Pulmonary neg pulmonary ROS,  breath sounds clear to auscultation  + decreased breath sounds      Cardiovascular hypertension, On Medications and Pt. on medications + CAD + dysrhythmias + pacemaker Rhythm:regular     Neuro/Psych negative neurological ROS  negative psych ROS   GI/Hepatic Neg liver ROS, hiatal hernia,   Endo/Other  diabetes  Renal/GU negative Renal ROS  negative genitourinary   Musculoskeletal   Abdominal   Peds  Hematology negative hematology ROS (+)   Anesthesia Other Findings See surgeon's H&P   Reproductive/Obstetrics negative OB ROS                          Anesthesia Physical Anesthesia Plan  ASA: IV  Anesthesia Plan: General   Post-op Pain Management:    Induction: Intravenous  Airway Management Planned: LMA  Additional Equipment:   Intra-op Plan:   Post-operative Plan: Extubation in OR  Informed Consent: I have reviewed the patients History and Physical, chart, labs and discussed the procedure including the risks, benefits and alternatives for the proposed anesthesia with the patient or authorized representative who has indicated his/her understanding and acceptance.   Dental Advisory Given  Plan Discussed with: CRNA, Surgeon and Anesthesiologist  Anesthesia Plan Comments:        Anesthesia Quick Evaluation

## 2012-09-21 NOTE — Progress Notes (Signed)
Subjective: Post op day one from I and D and vac placement of left leg.  Patient has no complaints  Objective: Vital signs in last 24 hours: Temp:  [97.4 F (36.3 C)-98.2 F (36.8 C)] 97.4 F (36.3 C) (10/26 0529) Pulse Rate:  [61-86] 61  (10/26 0529) Resp:  [16-18] 16  (10/26 0529) BP: (100-120)/(40-54) 100/40 mmHg (10/26 0529) SpO2:  [96 %-100 %] 100 % (10/26 0529)  Intake/Output from previous day: 10/25 0701 - 10/26 0700 In: 440 [P.O.:240; I.V.:200] Out: 0  Intake/Output this shift:     Basename 09/20/12 0500  HGB 8.9*    Basename 09/20/12 0500  WBC 6.8  RBC 2.90*  HCT 27.2*  PLT 258    Basename 09/20/12 0500  NA 140  K 3.7  CL 105  CO2 25  BUN 13  CREATININE 0.77  GLUCOSE 79  CALCIUM 8.6   No results found for this basename: LABPT:2,INR:2 in the last 72 hours  Neurologically intact Neurovascular intact Sensation intact distally Intact pulses distally Dorsiflexion/Plantar flexion intact Incision: dressing C/D/I  Assessment/ Principal Problem:  *Soft tissue infection, L leg Active Problems:  Pacemaker  Coronary artery disease  Morbid obesity  Osteomyelitis, left leg  UTI (lower urinary tract infection)  Hyperlipidemia  Acute blood loss anemia  Malnutrition  Hypokalemia    Plan: Non weight bearing both legs.  To OR Monday for STSG.   Damany Eastman J 09/21/2012, 10:34 AM

## 2012-09-22 ENCOUNTER — Encounter (HOSPITAL_COMMUNITY): Payer: Self-pay | Admitting: Anesthesiology

## 2012-09-22 DIAGNOSIS — I9589 Other hypotension: Secondary | ICD-10-CM

## 2012-09-22 LAB — CBC
Hemoglobin: 8.7 g/dL — ABNORMAL LOW (ref 12.0–15.0)
MCHC: 33.2 g/dL (ref 30.0–36.0)
RBC: 2.79 MIL/uL — ABNORMAL LOW (ref 3.87–5.11)
WBC: 7.3 10*3/uL (ref 4.0–10.5)

## 2012-09-22 LAB — BASIC METABOLIC PANEL
Chloride: 103 mEq/L (ref 96–112)
GFR calc non Af Amer: 77 mL/min — ABNORMAL LOW (ref >90)
Glucose, Bld: 80 mg/dL (ref 70–99)
Potassium: 3.6 mEq/L (ref 3.5–5.1)
Sodium: 137 mEq/L (ref 135–145)

## 2012-09-22 NOTE — Progress Notes (Signed)
Subjective: 2 Days Post-Op Procedure(s) (LRB): IRRIGATION AND DEBRIDEMENT EXTREMITY (Left) Patient reports pain as 3 on 0-10 scale.   Patients biggest complaint is itching all over her body Objective: Vital signs in last 24 hours: Temp:  [97.7 F (36.5 C)-98.7 F (37.1 C)] 98.7 F (37.1 C) (10/27 0942) Pulse Rate:  [66] 66  (10/27 0942) Resp:  [16-18] 16  (10/27 0942) BP: (99-109)/(35-49) 105/35 mmHg (10/27 0942) SpO2:  [96 %-100 %] 96 % (10/27 0942)  Intake/Output from previous day: 10/26 0701 - 10/27 0700 In: 560 [P.O.:320; I.V.:240] Out: -  Intake/Output this shift:     Basename 09/22/12 0435 09/20/12 0500  HGB 8.7* 8.9*    Basename 09/22/12 0435 09/20/12 0500  WBC 7.3 6.8  RBC 2.79* 2.90*  HCT 26.2* 27.2*  PLT 254 258    Basename 09/22/12 0435 09/20/12 0500  NA 137 140  K 3.6 3.7  CL 103 105  CO2 25 25  BUN 11 13  CREATININE 0.77 0.77  GLUCOSE 80 79  CALCIUM 8.5 8.6   No results found for this basename: LABPT:2,INR:2 in the last 72 hours  Neurologically intact ABD soft Neurovascular intact Sensation intact distally Intact pulses distally Dorsiflexion/Plantar flexion intact Incision: dressing C/D/I  Assessment/Plan: 2 Days Post-Op Procedure(s) (LRB): IRRIGATION AND DEBRIDEMENT EXTREMITY (Left) Principal Problem:  *Soft tissue infection, L leg Active Problems:  Pacemaker  Coronary artery disease  Morbid obesity  Osteomyelitis, left leg  UTI (lower urinary tract infection)  Hyperlipidemia  Acute blood loss anemia  Malnutrition  Hypokalemia  Hypotension due to blood loss  To OR tomorrow.  NPO after midnight.  Safiyyah Vasconez J 09/22/2012, 10:35 AM

## 2012-09-22 NOTE — Progress Notes (Signed)
Pt continues to c/o of constant itching on back, neck and abdomen. No rash visible. Itching relieved only short term by benadryl. Skin cleaned and powder applied with minimal relief.

## 2012-09-23 ENCOUNTER — Encounter (HOSPITAL_COMMUNITY)
Admission: RE | Disposition: A | Discharge status: Skilled Nursing Facility | Payer: Self-pay | Source: Ambulatory Visit | Attending: Orthopedic Surgery

## 2012-09-23 ENCOUNTER — Inpatient Hospital Stay (HOSPITAL_COMMUNITY): Payer: Medicare Other

## 2012-09-23 ENCOUNTER — Inpatient Hospital Stay (HOSPITAL_COMMUNITY): Payer: Medicare Other | Admitting: Anesthesiology

## 2012-09-23 ENCOUNTER — Encounter (HOSPITAL_COMMUNITY): Payer: Self-pay | Admitting: Anesthesiology

## 2012-09-23 HISTORY — PX: SKIN SPLIT GRAFT: SHX444

## 2012-09-23 LAB — BASIC METABOLIC PANEL
CO2: 27 mEq/L (ref 19–32)
Calcium: 8.7 mg/dL (ref 8.4–10.5)
Glucose, Bld: 88 mg/dL (ref 70–99)
Potassium: 4 mEq/L (ref 3.5–5.1)
Sodium: 142 mEq/L (ref 135–145)

## 2012-09-23 LAB — CBC
HCT: 28.7 % — ABNORMAL LOW (ref 36.0–46.0)
Hemoglobin: 9.4 g/dL — ABNORMAL LOW (ref 12.0–15.0)
MCH: 30.6 pg (ref 26.0–34.0)
RBC: 3.07 MIL/uL — ABNORMAL LOW (ref 3.87–5.11)

## 2012-09-23 SURGERY — APPLICATION, GRAFT, SKIN, SPLIT-THICKNESS
Anesthesia: General | Site: Leg Lower | Laterality: Left | Wound class: Dirty or Infected

## 2012-09-23 MED ORDER — MINERAL OIL LIGHT 100 % EX OIL
TOPICAL_OIL | CUTANEOUS | Status: DC | PRN
  Administered 2012-09-23: 1 via TOPICAL

## 2012-09-23 MED ORDER — FENTANYL CITRATE 0.05 MG/ML IJ SOLN
25.0000 ug | INTRAMUSCULAR | Status: DC | PRN
  Administered 2012-09-23: 25 ug via INTRAVENOUS

## 2012-09-23 MED ORDER — EPHEDRINE SULFATE 50 MG/ML IJ SOLN
INTRAMUSCULAR | Status: DC | PRN
  Administered 2012-09-23 (×2): 10 mg via INTRAVENOUS
  Administered 2012-09-23: 5 mg via INTRAVENOUS

## 2012-09-23 MED ORDER — ARTIFICIAL TEARS OP OINT
TOPICAL_OINTMENT | OPHTHALMIC | Status: DC | PRN
  Administered 2012-09-23: 1 via OPHTHALMIC

## 2012-09-23 MED ORDER — VANCOMYCIN HCL IN DEXTROSE 1-5 GM/200ML-% IV SOLN
1000.0000 mg | INTRAVENOUS | Status: DC
  Administered 2012-09-24 – 2012-09-25 (×2): 1000 mg via INTRAVENOUS
  Administered 2012-09-26: 500 mg via INTRAVENOUS
  Filled 2012-09-23 (×3): qty 200

## 2012-09-23 MED ORDER — MINERAL OIL LIGHT 100 % EX OIL
TOPICAL_OIL | CUTANEOUS | Status: AC
  Filled 2012-09-23: qty 25

## 2012-09-23 MED ORDER — DEXTROSE 50 % IV SOLN
INTRAVENOUS | Status: AC
  Filled 2012-09-23: qty 50

## 2012-09-23 MED ORDER — ONDANSETRON HCL 4 MG/2ML IJ SOLN
INTRAMUSCULAR | Status: DC | PRN
  Administered 2012-09-23: 4 mg via INTRAVENOUS

## 2012-09-23 MED ORDER — LACTATED RINGERS IV SOLN
INTRAVENOUS | Status: DC | PRN
  Administered 2012-09-23: 14:00:00 via INTRAVENOUS

## 2012-09-23 MED ORDER — LIDOCAINE HCL (CARDIAC) 20 MG/ML IV SOLN
INTRAVENOUS | Status: DC | PRN
  Administered 2012-09-23: 100 mg via INTRAVENOUS

## 2012-09-23 MED ORDER — SODIUM CHLORIDE 0.9 % IR SOLN
Status: DC | PRN
  Administered 2012-09-23: 1000 mL

## 2012-09-23 MED ORDER — ALBUMIN HUMAN 5 % IV SOLN
INTRAVENOUS | Status: DC | PRN
  Administered 2012-09-23: 15:00:00 via INTRAVENOUS

## 2012-09-23 MED ORDER — FENTANYL CITRATE 0.05 MG/ML IJ SOLN
INTRAMUSCULAR | Status: DC | PRN
  Administered 2012-09-23 (×2): 50 ug via INTRAVENOUS

## 2012-09-23 MED ORDER — PROPOFOL 10 MG/ML IV BOLUS
INTRAVENOUS | Status: DC | PRN
  Administered 2012-09-23: 120 mg via INTRAVENOUS

## 2012-09-23 MED ORDER — PHENYLEPHRINE HCL 10 MG/ML IJ SOLN
INTRAMUSCULAR | Status: DC | PRN
  Administered 2012-09-23: 40 ug via INTRAVENOUS

## 2012-09-23 MED ORDER — BUPIVACAINE-EPINEPHRINE PF 0.25-1:200000 % IJ SOLN
INTRAMUSCULAR | Status: AC
  Filled 2012-09-23: qty 30

## 2012-09-23 MED ORDER — FENTANYL CITRATE 0.05 MG/ML IJ SOLN: INTRAMUSCULAR | Status: DC | PRN

## 2012-09-23 MED ORDER — FENTANYL CITRATE 0.05 MG/ML IJ SOLN
INTRAMUSCULAR | Status: AC
  Filled 2012-09-23: qty 2

## 2012-09-23 MED ORDER — DEXAMETHASONE SODIUM PHOSPHATE 4 MG/ML IJ SOLN
INTRAMUSCULAR | Status: DC | PRN
  Administered 2012-09-23: 4 mg via INTRAVENOUS

## 2012-09-23 MED ORDER — DEXTROSE 50 % IV SOLN
12.5000 g | Freq: Once | INTRAVENOUS | Status: AC
  Administered 2012-09-23: 12.5 g via INTRAVENOUS

## 2012-09-23 MED ORDER — LIDOCAINE HCL 1 % IJ SOLN
INTRAMUSCULAR | Status: DC | PRN
  Administered 2012-09-23: 15:00:00

## 2012-09-23 SURGICAL SUPPLY — 69 items
BANDAGE ELASTIC 4 VELCRO ST LF (GAUZE/BANDAGES/DRESSINGS) ×2 IMPLANT
BANDAGE ELASTIC 6 VELCRO ST LF (GAUZE/BANDAGES/DRESSINGS) ×2 IMPLANT
BANDAGE GAUZE ELAST BULKY 4 IN (GAUZE/BANDAGES/DRESSINGS) ×2 IMPLANT
BLADE DERMATOME SS (BLADE) ×4 IMPLANT
BLADE SURG ROTATE 9660 (MISCELLANEOUS) IMPLANT
BNDG COHESIVE 4X5 TAN STRL (GAUZE/BANDAGES/DRESSINGS) IMPLANT
BRUSH SCRUB DISP (MISCELLANEOUS) ×4 IMPLANT
CANISTER SUCTION 2500CC (MISCELLANEOUS) IMPLANT
CLOTH BEACON ORANGE TIMEOUT ST (SAFETY) ×2 IMPLANT
COTTONBALL LRG STERILE PKG (GAUZE/BANDAGES/DRESSINGS) ×4 IMPLANT
COVER SURGICAL LIGHT HANDLE (MISCELLANEOUS) ×4 IMPLANT
DEPRESSOR TONGUE BLADE STERILE (MISCELLANEOUS) ×6 IMPLANT
DERMACARRIERS GRAFT 1 TO 1.5 (DISPOSABLE) ×2
DRAPE C-ARMOR (DRAPES) ×2 IMPLANT
DRAPE EXTREMITY T 121X128X90 (DRAPE) IMPLANT
DRAPE INCISE IOBAN 66X45 STRL (DRAPES) ×2 IMPLANT
DRAPE PROXIMA HALF (DRAPES) ×4 IMPLANT
DRSG ADAPTIC 3X8 NADH LF (GAUZE/BANDAGES/DRESSINGS) ×4 IMPLANT
DRSG PAD ABDOMINAL 8X10 ST (GAUZE/BANDAGES/DRESSINGS) ×4 IMPLANT
DRSG VAC ATS SM SENSATRAC (GAUZE/BANDAGES/DRESSINGS) ×2 IMPLANT
ELECT REM PT RETURN 9FT ADLT (ELECTROSURGICAL)
ELECTRODE REM PT RTRN 9FT ADLT (ELECTROSURGICAL) IMPLANT
GAUZE SPONGE 4X4 16PLY XRAY LF (GAUZE/BANDAGES/DRESSINGS) ×4 IMPLANT
GAUZE XEROFORM 5X9 LF (GAUZE/BANDAGES/DRESSINGS) IMPLANT
GLOVE BIO SURGEON STRL SZ7.5 (GLOVE) IMPLANT
GLOVE BIO SURGEON STRL SZ8 (GLOVE) ×2 IMPLANT
GLOVE BIOGEL PI IND STRL 7.5 (GLOVE) ×2 IMPLANT
GLOVE BIOGEL PI IND STRL 8 (GLOVE) ×1 IMPLANT
GLOVE BIOGEL PI INDICATOR 7.5 (GLOVE) ×2
GLOVE BIOGEL PI INDICATOR 8 (GLOVE) ×1
GLOVE SURG SS PI 7.5 STRL IVOR (GLOVE) ×6 IMPLANT
GOWN PREVENTION PLUS XLARGE (GOWN DISPOSABLE) ×2 IMPLANT
GOWN PREVENTION PLUS XXLARGE (GOWN DISPOSABLE) IMPLANT
GOWN STRL NON-REIN LRG LVL3 (GOWN DISPOSABLE) ×4 IMPLANT
GRAFT DERMACARRIERS 1 TO 1.5 (DISPOSABLE) ×1 IMPLANT
HANDPIECE INTERPULSE COAX TIP (DISPOSABLE)
KIT BASIN OR (CUSTOM PROCEDURE TRAY) ×2 IMPLANT
KIT ROOM TURNOVER OR (KITS) ×2 IMPLANT
MANIFOLD NEPTUNE II (INSTRUMENTS) IMPLANT
NS IRRIG 1000ML POUR BTL (IV SOLUTION) ×2 IMPLANT
PACK GENERAL/GYN (CUSTOM PROCEDURE TRAY) ×2 IMPLANT
PAD ARMBOARD 7.5X6 YLW CONV (MISCELLANEOUS) ×4 IMPLANT
PAD CAST 4YDX4 CTTN HI CHSV (CAST SUPPLIES) ×1 IMPLANT
PAD NEG PRESSURE SENSATRAC (MISCELLANEOUS) ×2 IMPLANT
PADDING CAST ABS 4INX4YD NS (CAST SUPPLIES) ×1
PADDING CAST ABS 6INX4YD NS (CAST SUPPLIES) ×1
PADDING CAST ABS COTTON 4X4 ST (CAST SUPPLIES) ×1 IMPLANT
PADDING CAST ABS COTTON 6X4 NS (CAST SUPPLIES) ×1 IMPLANT
PADDING CAST COTTON 4X4 STRL (CAST SUPPLIES) ×1
PADDING CAST COTTON 6X4 STRL (CAST SUPPLIES) ×2 IMPLANT
PENCIL BUTTON HOLSTER BLD 10FT (ELECTRODE) ×2 IMPLANT
SET HNDPC FAN SPRY TIP SCT (DISPOSABLE) IMPLANT
SPLINT PLASTER CAST XFAST 5X30 (CAST SUPPLIES) ×1 IMPLANT
SPLINT PLASTER XFAST SET 5X30 (CAST SUPPLIES) ×1
SPONGE GAUZE 4X4 12PLY (GAUZE/BANDAGES/DRESSINGS) ×2 IMPLANT
SPONGE LAP 18X18 X RAY DECT (DISPOSABLE) ×6 IMPLANT
SPONGE SCRUB IODOPHOR (GAUZE/BANDAGES/DRESSINGS) ×2 IMPLANT
STAPLER VISISTAT (STAPLE) ×2 IMPLANT
STAPLER VISISTAT 35W (STAPLE) ×2 IMPLANT
STOCKINETTE IMPERVIOUS LG (DRAPES) IMPLANT
SUT ETHILON 3 0 FSL (SUTURE) ×2 IMPLANT
SUT ETHILON 3 0 PS 1 (SUTURE) ×4 IMPLANT
SUT PROLENE 4 0 PS 2 18 (SUTURE) ×4 IMPLANT
TOWEL OR 17X24 6PK STRL BLUE (TOWEL DISPOSABLE) ×2 IMPLANT
TOWEL OR 17X26 10 PK STRL BLUE (TOWEL DISPOSABLE) ×4 IMPLANT
TUBE CONNECTING 12X1/4 (SUCTIONS) ×2 IMPLANT
UNDERPAD 30X30 INCONTINENT (UNDERPADS AND DIAPERS) ×2 IMPLANT
WATER STERILE IRR 1000ML POUR (IV SOLUTION) ×2 IMPLANT
YANKAUER SUCT BULB TIP NO VENT (SUCTIONS) IMPLANT

## 2012-09-23 NOTE — Anesthesia Preprocedure Evaluation (Addendum)
Anesthesia Evaluation  Patient identified by MRN, date of birth, ID band Patient awake    Airway Mallampati: II      Dental   Pulmonary shortness of breath, asthma , pneumonia -,  breath sounds clear to auscultation        Cardiovascular hypertension, Pt. on medications + CAD + pacemaker Rhythm:Regular Rate:Normal     Neuro/Psych    GI/Hepatic Neg liver ROS, hiatal hernia,   Endo/Other  diabetes, Type 2  Renal/GU negative Renal ROS     Musculoskeletal   Abdominal   Peds  Hematology negative hematology ROS (+)   Anesthesia Other Findings   Reproductive/Obstetrics                          Anesthesia Physical Anesthesia Plan  ASA: IV  Anesthesia Plan: General   Post-op Pain Management:    Induction: Intravenous  Airway Management Planned: Oral ETT  Additional Equipment:   Intra-op Plan:   Post-operative Plan: Possible Post-op intubation/ventilation  Informed Consent: I have reviewed the patients History and Physical, chart, labs and discussed the procedure including the risks, benefits and alternatives for the proposed anesthesia with the patient or authorized representative who has indicated his/her understanding and acceptance.     Plan Discussed with: CRNA, Anesthesiologist and Surgeon  Anesthesia Plan Comments:        Anesthesia Quick Evaluation

## 2012-09-23 NOTE — Progress Notes (Signed)
I have seen and examined the patient. I agree with the findings above.  Budd Palmer, MD 09/23/2012 2:03 PM
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
## 2012-09-23 NOTE — Brief Op Note (Signed)
09/10/2012 - 09/23/2012  3:57 PM  PATIENT:  Jill Bowman  78 y.o. female  PRE-OPERATIVE DIAGNOSIS:  OPEN INFECTED LEFT LEG   POST-OPERATIVE DIAGNOSIS:  open wound left leg  PROCEDURE:  Procedure(s) (LRB) with comments: SKIN GRAFT SPLIT THICKNESS (Left) - LEFT LEG  SURGEON:  Surgeon(s) and Role:    * Budd Palmer, MD - Primary  PHYSICIAN ASSISTANT: Montez Morita, [facility]  ANESTHESIA:   general  EBL:  Total I/O In: 1150 [I.V.:900; IV Piggyback:250] Out: -   BLOOD ADMINISTERED:none  DRAINS: wound vac  LOCAL MEDICATIONS USED:  MARCAINE     SPECIMEN:  No Specimen  DISPOSITION OF SPECIMEN:  N/A  COUNTS:  YES  TOURNIQUET:  * No tourniquets in log *  DICTATION: .Other Dictation: Dictation Number [id number]  PLAN OF CARE: Admit to inpatient   PATIENT DISPOSITION:  PACU - hemodynamically stable.   Delay start of Pharmacological VTE agent (>24hrs) due to surgical blood loss or risk of bleeding: no

## 2012-09-23 NOTE — Anesthesia Postprocedure Evaluation (Signed)
  Anesthesia Post-op Note  Patient: Jill Bowman  Procedure(s) Performed: Procedure(s) (LRB) with comments: SKIN GRAFT SPLIT THICKNESS (Left) - LEFT LEG  Patient Location: PACU  Anesthesia Type:General  Level of Consciousness: awake  Airway and Oxygen Therapy: Patient Spontanous Breathing  Post-op Pain: mild  Post-op Assessment: Post-op Vital signs reviewed  Post-op Vital Signs: Reviewed  Complications: No apparent anesthesia complications

## 2012-09-23 NOTE — Transfer of Care (Signed)
Immediate Anesthesia Transfer of Care Note  Patient: Jill Bowman  Procedure(s) Performed: Procedure(s) (LRB) with comments: SKIN GRAFT SPLIT THICKNESS (Left) - LEFT LEG  Patient Location: PACU  Anesthesia Type:General  Level of Consciousness: oriented, sedated, patient cooperative and responds to stimulation  Airway & Oxygen Therapy: Patient Spontanous Breathing and Patient connected to nasal cannula oxygen  Post-op Assessment: Report given to PACU RN, Post -op Vital signs reviewed and stable, Patient moving all extremities and Patient moving all extremities X 4  Post vital signs: Reviewed and stable  Complications: No apparent anesthesia complications

## 2012-09-23 NOTE — OR Nursing (Signed)
Patient stated not allergic to latex per Lanell Matar RN/ [facility] CRNA.

## 2012-09-23 NOTE — Preoperative (Signed)
Beta Blockers   Reason not to administer Beta Blockers:Not Applicable 
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
## 2012-09-24 LAB — BASIC METABOLIC PANEL
BUN: 10 mg/dL (ref 6–23)
CO2: 25 mEq/L (ref 19–32)
Calcium: 8.6 mg/dL (ref 8.4–10.5)
Chloride: 109 mEq/L (ref 96–112)
Creatinine, Ser: 0.84 mg/dL (ref 0.50–1.10)
Glucose, Bld: 108 mg/dL — ABNORMAL HIGH (ref 70–99)

## 2012-09-24 LAB — CBC
HCT: 27.1 % — ABNORMAL LOW (ref 36.0–46.0)
MCH: 31.1 pg (ref 26.0–34.0)
MCV: 94.8 fL (ref 78.0–100.0)
RBC: 2.86 MIL/uL — ABNORMAL LOW (ref 3.87–5.11)
WBC: 5.5 10*3/uL (ref 4.0–10.5)

## 2012-09-24 MED ORDER — BOOST / RESOURCE BREEZE PO LIQD: 1.0000 | ORAL | Status: DC | PRN

## 2012-09-24 NOTE — Progress Notes (Signed)
Orthopaedic Trauma Service (OTS)  Subjective: 1 Day Post-Op Procedure(s) (LRB): SKIN GRAFT SPLIT THICKNESS (Left) Patient reports pain as mild.   Eating comfortably.  Objective: Current Vitals Blood pressure 107/46, pulse 64, temperature 97.7 F (36.5 C), temperature source Oral, resp. rate 16, height 5\' 7"  (1.702 m), weight 225 lb (102.059 kg), SpO2 100.00%. Vital signs in last 24 hours: Temp:  [97.4 F (36.3 C)-98.2 F (36.8 C)] 97.7 F (36.5 C) (10/29 0600) Pulse Rate:  [62-73] 64  (10/29 0600) Resp:  [13-19] 16  (10/29 0600) BP: (100-128)/(33-75) 107/46 mmHg (10/29 0600) SpO2:  [97 %-100 %] 100 % (10/29 0600)  Intake/Output from previous day: 10/28 0701 - 10/29 0700 In: 1375 [I.V.:1125; IV Piggyback:250] Out: 0  Wound vac off on entry.  LABS  Basename 09/24/12 0527 09/23/12 0500 09/22/12 0435  HGB 8.9* 9.4* 8.7*    Basename 09/24/12 0527 09/23/12 0500  WBC 5.5 6.8  RBC 2.86* 3.07*  HCT 27.1* 28.7*  PLT 281 260    Basename 09/24/12 0527 09/23/12 0500  NA 144 142  K 4.0 4.0  CL 109 108  CO2 25 27  BUN 10 9  CREATININE 0.84 0.85  GLUCOSE 108* 88  CALCIUM 8.6 8.7   No results found for this basename: LABPT:2,INR:2 in the last 72 hours  Physical Exam  Splints in place bilaterally. No change in distal neurovascular exam. Left thigh drsg clean and dry.  Imaging Ct Ankle Right Wo Contrast  09/24/2012  *RADIOLOGY REPORT*  Clinical Data: Comminuted fractures of the distal tibia and fibula.  CT OF THE RIGHT ANKLE WITH CONTRAST  Technique:  Multidetector CT imaging was performed following the standard protocol during bolus administration of intravenous contrast.  Comparison: Radiographs dated 09/10/2012  Findings: There is a severely comminuted fracture of the distal tibia with extensive involvement of the tibial plafond.  The plafond is fragmented. The dominant fragment is posteriorly displaced.  Methylmethacrylate is seen among the fracture fragments.  There  is no osseous union.  There is also a severely comminuted fracture of the fibula.  There is no union of the proximal portion of the comminuted fracture. There is a tiny area of what appears to be union of the posterior aspect of the distal portion of the fibular fracture.  No other bones of the hind foot are intact.  The patient has developed arthritic changes of the tibiotalar joint and in the posterior facets of the subtalar joint.  Achilles tendon and plantar fascia are intact.  Tendons about the ankle are intact.  The distal fibular rod is immediately adjacent to the peroneal tendons just below the tip of the fibula.  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 The tibial plafond is disintegrated.   Original Report Authenticated By: Gwynn Burly, M.D.     Assessment/Plan: 1 Day Post-Op Procedure(s) (LRB): SKIN GRAFT SPLIT THICKNESS (Left)  Plan: remove outer dressing over donor site and preserve xeroform; fan to dry out today CT scan demonstrates feasibility of abx cement removal posteriorly; plan for ORIF of right ankle from posterior approach on Thursday or Friday of this week. Removal of STSG recipient site on Thursday  Myrene Galas, MD [facility] [phone] (p)
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
## 2012-09-24 NOTE — Progress Notes (Signed)
Patient's family has encouraged patient to consider placement at [facility]. CSW met with patient who stated that she was very pleased with care at [facility] and was quite satisfied to return there.  She stated that due to her family's encouragement- she would agree to CSW sending a referral to [facility] and then decide if a bed offer is received. CSW will initiate. Pt will undergo further surgery on Friday per MD.  CSW will continue to monitor.  Lorri Frederick. [name]  [phone]

## 2012-09-24 NOTE — Progress Notes (Signed)
Utilization review completed. Aleira Deiter, RN, BSN. 
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
## 2012-09-25 NOTE — Progress Notes (Signed)
I have seen and examined the patient. I agree with the findings above.  Budd Palmer, MD 09/25/2012 10:20 PM

## 2012-09-25 NOTE — Progress Notes (Signed)
09/25/12 0400  Clinical Encounter Type  Visited With Patient and family together  Visit Type Follow-up;Post-op   I followed up to see Ms. Nisley after her surgery. I also spoke to one of her son and one of her daughters.  Chaplain Vonzella Nipple

## 2012-09-26 ENCOUNTER — Inpatient Hospital Stay (HOSPITAL_COMMUNITY): Payer: Medicare Other | Admitting: Anesthesiology

## 2012-09-26 ENCOUNTER — Encounter (HOSPITAL_COMMUNITY): Payer: Self-pay | Admitting: Anesthesiology

## 2012-09-26 ENCOUNTER — Inpatient Hospital Stay (HOSPITAL_COMMUNITY): Payer: Medicare Other

## 2012-09-26 ENCOUNTER — Encounter (HOSPITAL_COMMUNITY)
Admission: RE | Disposition: A | Discharge status: Skilled Nursing Facility | Payer: Self-pay | Source: Ambulatory Visit | Attending: Orthopedic Surgery

## 2012-09-26 ENCOUNTER — Encounter (HOSPITAL_COMMUNITY): Payer: Self-pay | Admitting: Orthopedic Surgery

## 2012-09-26 HISTORY — PX: SYNDESMOSIS REPAIR: SHX5182

## 2012-09-26 HISTORY — PX: ORIF TIBIA FRACTURE: SHX5416

## 2012-09-26 LAB — CBC
HCT: 29.1 % — ABNORMAL LOW (ref 36.0–46.0)
MCH: 31.4 pg (ref 26.0–34.0)
MCHC: 32.6 g/dL (ref 30.0–36.0)
MCV: 96 fL (ref 78.0–100.0)
RDW: 15.5 % (ref 11.5–15.5)

## 2012-09-26 LAB — GLUCOSE, CAPILLARY
Glucose-Capillary: 65 mg/dL — ABNORMAL LOW (ref 70–99)
Glucose-Capillary: 102 mg/dL — ABNORMAL HIGH (ref 70–99)

## 2012-09-26 LAB — COMPREHENSIVE METABOLIC PANEL
Albumin: 2.6 g/dL — ABNORMAL LOW (ref 3.5–5.2)
BUN: 11 mg/dL (ref 6–23)
Calcium: 8.7 mg/dL (ref 8.4–10.5)
Creatinine, Ser: 0.82 mg/dL (ref 0.50–1.10)
GFR calc Af Amer: 76 mL/min — ABNORMAL LOW (ref >90)
Glucose, Bld: 73 mg/dL (ref 70–99)
Potassium: 3.7 mEq/L (ref 3.5–5.1)
Total Protein: 5.8 g/dL — ABNORMAL LOW (ref 6.0–8.3)

## 2012-09-26 SURGERY — OPEN REDUCTION INTERNAL FIXATION (ORIF) TIBIA FRACTURE
Anesthesia: General | Site: Ankle | Laterality: Right | Wound class: Clean

## 2012-09-26 MED ORDER — PROPOFOL 10 MG/ML IV BOLUS
INTRAVENOUS | Status: DC | PRN
  Administered 2012-09-26: 110 mg via INTRAVENOUS

## 2012-09-26 MED ORDER — FENTANYL CITRATE 0.05 MG/ML IJ SOLN
INTRAMUSCULAR | Status: DC | PRN
  Administered 2012-09-26: 50 ug via INTRAVENOUS
  Administered 2012-09-26: 100 ug via INTRAVENOUS
  Administered 2012-09-26 (×5): 50 ug via INTRAVENOUS

## 2012-09-26 MED ORDER — ONDANSETRON HCL 4 MG/2ML IJ SOLN: 4.0000 mg | Freq: Once | INTRAMUSCULAR | Status: AC | PRN

## 2012-09-26 MED ORDER — VANCOMYCIN HCL IN DEXTROSE 1-5 GM/200ML-% IV SOLN
1000.0000 mg | INTRAVENOUS | Status: DC
  Administered 2012-09-27 – 2012-09-30 (×4): 1000 mg via INTRAVENOUS
  Filled 2012-09-26 (×5): qty 200

## 2012-09-26 MED ORDER — HYDROMORPHONE HCL PF 1 MG/ML IJ SOLN
0.2500 mg | INTRAMUSCULAR | Status: DC | PRN
  Administered 2012-09-26 (×5): 0.25 mg via INTRAVENOUS

## 2012-09-26 MED ORDER — ROCURONIUM BROMIDE 100 MG/10ML IV SOLN
INTRAVENOUS | Status: DC | PRN
  Administered 2012-09-26: 50 mg via INTRAVENOUS

## 2012-09-26 MED ORDER — DEXTROSE 50 % IV SOLN
12.5000 g | Freq: Once | INTRAVENOUS | Status: AC
  Administered 2012-09-26: 12.5 g via INTRAVENOUS
  Filled 2012-09-26: qty 50

## 2012-09-26 MED ORDER — MIDAZOLAM HCL 5 MG/5ML IJ SOLN
INTRAMUSCULAR | Status: DC | PRN
  Administered 2012-09-26 (×2): 1 mg via INTRAVENOUS

## 2012-09-26 MED ORDER — LACTATED RINGERS IV SOLN
INTRAVENOUS | Status: DC | PRN
  Administered 2012-09-26 (×2): via INTRAVENOUS

## 2012-09-26 MED ORDER — OXYCODONE HCL 5 MG/5ML PO SOLN: 5.0000 mg | Freq: Once | ORAL | Status: AC | PRN

## 2012-09-26 MED ORDER — 0.9 % SODIUM CHLORIDE (POUR BTL) OPTIME
TOPICAL | Status: DC | PRN
  Administered 2012-09-26: 1000 mL

## 2012-09-26 MED ORDER — DEXTROSE 50 % IV SOLN
INTRAVENOUS | Status: AC
  Filled 2012-09-26: qty 50

## 2012-09-26 MED ORDER — OXYCODONE HCL 5 MG PO TABS
5.0000 mg | ORAL_TABLET | ORAL | Status: DC | PRN
  Administered 2012-09-27 – 2012-09-28 (×4): 10 mg via ORAL
  Administered 2012-09-29 (×2): 5 mg via ORAL
  Administered 2012-09-29 – 2012-09-30 (×2): 10 mg via ORAL
  Administered 2012-10-01: 5 mg via ORAL
  Filled 2012-09-26 (×2): qty 2
  Filled 2012-09-26 (×2): qty 1
  Filled 2012-09-26 (×4): qty 2
  Filled 2012-09-26: qty 1

## 2012-09-26 MED ORDER — PHENYLEPHRINE HCL 10 MG/ML IJ SOLN
10.0000 mg | INTRAVENOUS | Status: DC | PRN
  Administered 2012-09-26: 15 ug/min via INTRAVENOUS

## 2012-09-26 MED ORDER — HYDROMORPHONE HCL PF 1 MG/ML IJ SOLN
INTRAMUSCULAR | Status: AC
  Filled 2012-09-26: qty 1

## 2012-09-26 MED ORDER — OXYCODONE HCL 5 MG PO TABS: 5.0000 mg | ORAL_TABLET | Freq: Once | ORAL | Status: AC | PRN

## 2012-09-26 MED ORDER — ONDANSETRON HCL 4 MG/2ML IJ SOLN
INTRAMUSCULAR | Status: DC | PRN
  Administered 2012-09-26: 4 mg via INTRAVENOUS

## 2012-09-26 MED ORDER — VANCOMYCIN HCL 1000 MG IV SOLR: 1000.0000 mg | Freq: Once | INTRAVENOUS | Status: DC

## 2012-09-26 MED ORDER — SODIUM CHLORIDE 0.9 % IV SOLN
500.0000 mg | Freq: Once | INTRAVENOUS | Status: DC
  Filled 2012-09-26: qty 500

## 2012-09-26 MED ORDER — PHENYLEPHRINE HCL 10 MG/ML IJ SOLN
INTRAMUSCULAR | Status: DC | PRN
  Administered 2012-09-26 (×3): 40 ug via INTRAVENOUS
  Administered 2012-09-26: 80 ug via INTRAVENOUS
  Administered 2012-09-26 (×4): 40 ug via INTRAVENOUS

## 2012-09-26 MED ORDER — LACTATED RINGERS IV SOLN
INTRAVENOUS | Status: DC
  Administered 2012-09-26: 50 mL via INTRAVENOUS
  Administered 2012-09-28: 23:00:00 via INTRAVENOUS

## 2012-09-26 MED ORDER — MORPHINE SULFATE 2 MG/ML IJ SOLN
1.0000 mg | INTRAMUSCULAR | Status: DC | PRN
  Filled 2012-09-26: qty 1

## 2012-09-26 SURGICAL SUPPLY — 91 items
3.5mm LCP posterior distal tibia t-plate/12 hole (Plate) ×2 IMPLANT
BANDAGE ELASTIC 4 VELCRO ST LF (GAUZE/BANDAGES/DRESSINGS) ×2 IMPLANT
BANDAGE ELASTIC 6 VELCRO ST LF (GAUZE/BANDAGES/DRESSINGS) ×2 IMPLANT
BANDAGE ESMARK 6X9 LF (GAUZE/BANDAGES/DRESSINGS) ×1 IMPLANT
BANDAGE GAUZE ELAST BULKY 4 IN (GAUZE/BANDAGES/DRESSINGS) ×2 IMPLANT
BIT DRILL 2.5X110 QC LCP DISP (BIT) ×2 IMPLANT
BIT DRILL 2.8 (BIT) ×1
BIT DRILL CANN QC 2.8X165 (BIT) ×1 IMPLANT
BIT DRILL QC 3.5X110 (BIT) ×2 IMPLANT
BLADE SURG 10 STRL SS (BLADE) ×2 IMPLANT
BLADE SURG 15 STRL LF DISP TIS (BLADE) ×1 IMPLANT
BLADE SURG 15 STRL SS (BLADE) ×1
BLADE SURG ROTATE 9660 (MISCELLANEOUS) IMPLANT
BNDG COHESIVE 4X5 TAN STRL (GAUZE/BANDAGES/DRESSINGS) ×2 IMPLANT
BNDG ESMARK 6X9 LF (GAUZE/BANDAGES/DRESSINGS) ×2
BONE CANC CHIPS 40CC CAN1/2 (Bone Implant) ×2 IMPLANT
BONE CHIP PRESERV 20CC (Bone Implant) ×2 IMPLANT
BRUSH SCRUB DISP (MISCELLANEOUS) ×4 IMPLANT
CHIPS CANC BONE 40CC CAN1/2 (Bone Implant) ×1 IMPLANT
CLOTH BEACON ORANGE TIMEOUT ST (SAFETY) ×2 IMPLANT
COVER MAYO STAND STRL (DRAPES) ×2 IMPLANT
DRAPE C-ARM 42X72 X-RAY (DRAPES) ×2 IMPLANT
DRAPE C-ARMOR (DRAPES) ×2 IMPLANT
DRAPE INCISE IOBAN 66X45 STRL (DRAPES) ×2 IMPLANT
DRAPE ORTHO SPLIT 77X108 STRL (DRAPES)
DRAPE SURG ORHT 6 SPLT 77X108 (DRAPES) IMPLANT
DRAPE U-SHAPE 47X51 STRL (DRAPES) ×2 IMPLANT
DRILL BIT 2.8MM (BIT) ×1
DRSG ADAPTIC 3X8 NADH LF (GAUZE/BANDAGES/DRESSINGS) ×2 IMPLANT
DRSG PAD ABDOMINAL 8X10 ST (GAUZE/BANDAGES/DRESSINGS) ×8 IMPLANT
ELECT REM PT RETURN 9FT ADLT (ELECTROSURGICAL) ×2
ELECTRODE REM PT RTRN 9FT ADLT (ELECTROSURGICAL) ×1 IMPLANT
EVACUATOR 1/8 PVC DRAIN (DRAIN) IMPLANT
EVACUATOR 3/16  PVC DRAIN (DRAIN)
EVACUATOR 3/16 PVC DRAIN (DRAIN) IMPLANT
GLOVE BIO SURGEON STRL SZ7.5 (GLOVE) ×2 IMPLANT
GLOVE BIO SURGEON STRL SZ8 (GLOVE) ×2 IMPLANT
GLOVE BIOGEL PI IND STRL 7.5 (GLOVE) ×1 IMPLANT
GLOVE BIOGEL PI IND STRL 8 (GLOVE) ×1 IMPLANT
GLOVE BIOGEL PI INDICATOR 7.5 (GLOVE) ×1
GLOVE BIOGEL PI INDICATOR 8 (GLOVE) ×1
GOWN PREVENTION PLUS XLARGE (GOWN DISPOSABLE) ×2 IMPLANT
GOWN STRL NON-REIN LRG LVL3 (GOWN DISPOSABLE) ×4 IMPLANT
IMMOBILIZER KNEE 22 UNIV (SOFTGOODS) ×2 IMPLANT
K-WIRE 1.6X150 (WIRE) ×10
KIT BASIN OR (CUSTOM PROCEDURE TRAY) ×2 IMPLANT
KIT INFUSE LRG II (Orthopedic Implant) ×2 IMPLANT
KIT ROOM TURNOVER OR (KITS) ×2 IMPLANT
KWIRE 1.6X150 (WIRE) ×5 IMPLANT
MANIFOLD NEPTUNE II (INSTRUMENTS) ×2 IMPLANT
NDL SUT .5 MAYO 1.404X.05X (NEEDLE) IMPLANT
NEEDLE 22X1 1/2 (OR ONLY) (NEEDLE) IMPLANT
NEEDLE MAYO TAPER (NEEDLE)
NS IRRIG 1000ML POUR BTL (IV SOLUTION) ×2 IMPLANT
PACK ORTHO EXTREMITY (CUSTOM PROCEDURE TRAY) ×2 IMPLANT
PAD ARMBOARD 7.5X6 YLW CONV (MISCELLANEOUS) ×4 IMPLANT
PAD CAST 4YDX4 CTTN HI CHSV (CAST SUPPLIES) ×1 IMPLANT
PADDING CAST COTTON 4X4 STRL (CAST SUPPLIES) ×1
PADDING CAST COTTON 6X4 STRL (CAST SUPPLIES) ×2 IMPLANT
SCREW CANC FT 4.0X50 (Screw) ×2 IMPLANT
SCREW CORTEX 3.5 28MM (Screw) ×1 IMPLANT
SCREW CORTEX 3.5 30MM (Screw) ×1 IMPLANT
SCREW CORTEX 3.5 38MM (Screw) ×1 IMPLANT
SCREW CORTEX 3.5 50MM (Screw) ×2 IMPLANT
SCREW LOCK CORT ST 3.5X28 (Screw) ×1 IMPLANT
SCREW LOCK CORT ST 3.5X30 (Screw) ×1 IMPLANT
SCREW LOCK CORT ST 3.5X38 (Screw) ×1 IMPLANT
SCREW LOCK T15 FT 28X3.5X2.9X (Screw) ×2 IMPLANT
SCREW LOCKING 3.5X28 (Screw) ×2 IMPLANT
SCREW LOCKING 3.5X45 (Screw) ×4 IMPLANT
SCREW LOCKING 3.5X50 (Screw) ×6 IMPLANT
SPONGE GAUZE 4X4 12PLY (GAUZE/BANDAGES/DRESSINGS) ×2 IMPLANT
SPONGE LAP 18X18 X RAY DECT (DISPOSABLE) ×2 IMPLANT
STAPLER VISISTAT 35W (STAPLE) ×2 IMPLANT
STOCKINETTE IMPERVIOUS LG (DRAPES) ×2 IMPLANT
SUCTION FRAZIER TIP 10 FR DISP (SUCTIONS) ×2 IMPLANT
SUT ETHILON 3 0 PS 1 (SUTURE) IMPLANT
SUT PROLENE 0 CT 2 (SUTURE) ×4 IMPLANT
SUT VIC AB 0 CT1 27 (SUTURE) ×1
SUT VIC AB 0 CT1 27XBRD ANBCTR (SUTURE) ×1 IMPLANT
SUT VIC AB 1 CT1 27 (SUTURE) ×1
SUT VIC AB 1 CT1 27XBRD ANBCTR (SUTURE) ×1 IMPLANT
SUT VIC AB 2-0 CT1 27 (SUTURE) ×2
SUT VIC AB 2-0 CT1 TAPERPNT 27 (SUTURE) ×2 IMPLANT
SYR 20ML ECCENTRIC (SYRINGE) IMPLANT
TOWEL OR 17X24 6PK STRL BLUE (TOWEL DISPOSABLE) ×2 IMPLANT
TOWEL OR 17X26 10 PK STRL BLUE (TOWEL DISPOSABLE) ×4 IMPLANT
TRAY FOLEY CATH 14FR (SET/KITS/TRAYS/PACK) IMPLANT
TUBE CONNECTING 12X1/4 (SUCTIONS) ×2 IMPLANT
WATER STERILE IRR 1000ML POUR (IV SOLUTION) ×4 IMPLANT
YANKAUER SUCT BULB TIP NO VENT (SUCTIONS) ×2 IMPLANT

## 2012-09-26 NOTE — Brief Op Note (Signed)
09/10/2012 - 09/26/2012  2:44 PM  PATIENT:  Jill Bowman  78 y.o. female  PRE-OPERATIVE DIAGNOSIS:  right tibial shaft and pilon nonunion, disrupted syndesmosis  POST-OPERATIVE DIAGNOSIS:  right tibial shaft and pilon nonunion, disrupted syndesmosis  PROCEDURE:   1. Repair of tibial pilon nonunion 2. Repair of tibial shaft nonunion 3. Syndesmotic fusion distal tibia and fibula  SURGEON:  Surgeon(s) and Role:    * Budd Palmer, MD - Primary  PHYSICIAN ASSISTANT: Montez Morita, [facility]  ANESTHESIA:   general  EBL:  Total I/O In: 1000 [I.V.:1000] Out: 75 [Blood:75]  BLOOD ADMINISTERED:none  DRAINS: none   LOCAL MEDICATIONS USED:  NONE  SPECIMEN:  No Specimen  DISPOSITION OF SPECIMEN:  N/A  COUNTS:  YES  TOURNIQUET:  * No tourniquets in log *  DICTATION: .Other Dictation: Dictation Number [id number]  PLAN OF CARE: Admit to inpatient   PATIENT DISPOSITION:  PACU - hemodynamically stable.   Delay start of Pharmacological VTE agent (>24hrs) due to surgical blood loss or risk of bleeding: no

## 2012-09-26 NOTE — Anesthesia Procedure Notes (Signed)
Procedures
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
## 2012-09-26 NOTE — Transfer of Care (Signed)
Immediate Anesthesia Transfer of Care Note  Patient: Keely Garrette  Procedure(s) Performed: Procedure(s) (LRB) with comments: OPEN REDUCTION INTERNAL FIXATION (ORIF) TIBIA FRACTURE (Right) - Right Non Union Tibia Repair  SYNDESMOSIS REPAIR (Right) - Right Syndesmosis Repair   Patient Location: PACU  Anesthesia Type:General  Level of Consciousness: awake, sedated and patient cooperative  Airway & Oxygen Therapy: Patient Spontanous Breathing and Patient connected to face mask oxygen  Post-op Assessment: Report given to PACU RN, Post -op Vital signs reviewed and stable and Patient moving all extremities  Post vital signs: Reviewed and stable  Complications: No apparent anesthesia complications

## 2012-09-26 NOTE — Anesthesia Postprocedure Evaluation (Signed)
  Anesthesia Post-op Note  Patient: Jill Bowman  Procedure(s) Performed: Procedure(s) (LRB) with comments: OPEN REDUCTION INTERNAL FIXATION (ORIF) TIBIA FRACTURE (Right) - Right Non Union Tibia Repair  SYNDESMOSIS REPAIR (Right) - Right Syndesmosis Repair   Patient Location: PACU  Anesthesia Type:General  Level of Consciousness: awake and alert   Airway and Oxygen Therapy: Patient Spontanous Breathing and Patient connected to nasal cannula oxygen  Post-op Pain: mild  Post-op Assessment: Post-op Vital signs reviewed  Post-op Vital Signs: Reviewed  Complications: No apparent anesthesia complications

## 2012-09-26 NOTE — Anesthesia Preprocedure Evaluation (Addendum)
Anesthesia Evaluation  Patient identified by MRN, date of birth, ID band Patient awake    Reviewed: Allergy & Precautions, H&P , NPO status , Patient's Chart, lab work & pertinent test results  Airway Mallampati: I  Neck ROM: Full    Dental  (+) Edentulous Upper and Teeth Intact   Pulmonary  breath sounds clear to auscultation        Cardiovascular Rhythm:Regular Rate:Normal     Neuro/Psych    GI/Hepatic   Endo/Other    Renal/GU      Musculoskeletal   Abdominal   Peds  Hematology   Anesthesia Other Findings   Reproductive/Obstetrics                           Anesthesia Physical Anesthesia Plan  ASA: III  Anesthesia Plan: General   Post-op Pain Management:    Induction: Intravenous  Airway Management Planned: Oral ETT  Additional Equipment:   Intra-op Plan:   Post-operative Plan: Extubation in OR  Informed Consent: I have reviewed the patients History and Physical, chart, labs and discussed the procedure including the risks, benefits and alternatives for the proposed anesthesia with the patient or authorized representative who has indicated his/her understanding and acceptance.   Dental advisory given  Plan Discussed with: CRNA, Anesthesiologist and Surgeon  Anesthesia Plan Comments:         Anesthesia Quick Evaluation

## 2012-09-26 NOTE — Preoperative (Signed)
Beta Blockers   Reason not to administer Beta Blockers:Not Applicable 

## 2012-09-27 LAB — BASIC METABOLIC PANEL
BUN: 11 mg/dL (ref 6–23)
GFR calc Af Amer: 78 mL/min — ABNORMAL LOW (ref >90)
GFR calc non Af Amer: 67 mL/min — ABNORMAL LOW (ref >90)
Potassium: 3.9 mEq/L (ref 3.5–5.1)
Sodium: 139 mEq/L (ref 135–145)

## 2012-09-27 LAB — CBC
HCT: 24.4 % — ABNORMAL LOW (ref 36.0–46.0)
MCHC: 33.2 g/dL (ref 30.0–36.0)
Platelets: 258 10*3/uL (ref 150–400)
RDW: 15.2 % (ref 11.5–15.5)
WBC: 7.6 10*3/uL (ref 4.0–10.5)

## 2012-09-27 MED ORDER — HYDROCHLOROTHIAZIDE 25 MG PO TABS
25.0000 mg | ORAL_TABLET | Freq: Every day | ORAL | Status: DC
  Administered 2012-09-28 – 2012-10-01 (×4): 25 mg via ORAL
  Filled 2012-09-27 (×5): qty 1

## 2012-09-27 NOTE — Progress Notes (Signed)
I saw and examined the patient with Mr. Paul, communicating the findings and plan noted above.  Genifer Lazenby, MD [facility] [phone] (p)  

## 2012-09-27 NOTE — Progress Notes (Signed)
No change in d/c plan at this time.  Continue to monitor for stability per MD.  Plan dc back to [facility] when medically stable per MD.  Lorri Frederick. [name]  [phone]
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
## 2012-09-27 NOTE — Progress Notes (Signed)
Utilization review completed. Freedom Lopezperez, RN, BSN. 

## 2012-09-28 LAB — CBC
Hemoglobin: 8 g/dL — ABNORMAL LOW (ref 12.0–15.0)
MCHC: 32.7 g/dL (ref 30.0–36.0)
WBC: 7.5 10*3/uL (ref 4.0–10.5)

## 2012-09-28 LAB — BASIC METABOLIC PANEL
Chloride: 105 mEq/L (ref 96–112)
GFR calc Af Amer: 88 mL/min — ABNORMAL LOW (ref >90)
GFR calc non Af Amer: 76 mL/min — ABNORMAL LOW (ref >90)
Glucose, Bld: 82 mg/dL (ref 70–99)
Potassium: 3.8 mEq/L (ref 3.5–5.1)
Sodium: 141 mEq/L (ref 135–145)
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
## 2012-09-29 LAB — TYPE AND SCREEN
ABO/RH(D): O POS
Antibody Screen: POSITIVE
DAT, IgG: NEGATIVE
Unit division: 0

## 2012-09-29 LAB — BASIC METABOLIC PANEL
CO2: 27 mEq/L (ref 19–32)
Glucose, Bld: 80 mg/dL (ref 70–99)
Potassium: 3.7 mEq/L (ref 3.5–5.1)
Sodium: 136 mEq/L (ref 135–145)

## 2012-09-29 LAB — CBC
HCT: 23.7 % — ABNORMAL LOW (ref 36.0–46.0)
Hemoglobin: 7.7 g/dL — ABNORMAL LOW (ref 12.0–15.0)
MCH: 30.7 pg (ref 26.0–34.0)
RBC: 2.51 MIL/uL — ABNORMAL LOW (ref 3.87–5.11)

## 2012-09-29 MED ORDER — DOCUSATE SODIUM 100 MG PO CAPS
100.0000 mg | ORAL_CAPSULE | Freq: Two times a day (BID) | ORAL | Status: DC
  Administered 2012-09-29 – 2012-10-01 (×5): 100 mg via ORAL
  Filled 2012-09-29 (×5): qty 1
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
## 2012-09-30 ENCOUNTER — Encounter (HOSPITAL_COMMUNITY): Payer: Self-pay | Admitting: Orthopedic Surgery

## 2012-09-30 DIAGNOSIS — S82252N Displaced comminuted fracture of shaft of left tibia, subsequent encounter for open fracture type IIIA, IIIB, or IIIC with nonunion: Secondary | ICD-10-CM

## 2012-09-30 DIAGNOSIS — R5381 Other malaise: Secondary | ICD-10-CM | POA: Diagnosis present

## 2012-09-30 DIAGNOSIS — S82301N Unspecified fracture of lower end of right tibia, subsequent encounter for open fracture type IIIA, IIIB, or IIIC with nonunion: Secondary | ICD-10-CM | POA: Diagnosis present

## 2012-09-30 LAB — CBC
MCH: 30.4 pg (ref 26.0–34.0)
MCHC: 33 g/dL (ref 30.0–36.0)
Platelets: 293 10*3/uL (ref 150–400)

## 2012-09-30 LAB — BASIC METABOLIC PANEL
Calcium: 8.9 mg/dL (ref 8.4–10.5)
GFR calc non Af Amer: 66 mL/min — ABNORMAL LOW (ref >90)
Glucose, Bld: 78 mg/dL (ref 70–99)
Sodium: 140 mEq/L (ref 135–145)

## 2012-09-30 LAB — TSH: TSH: 3.344 u[IU]/mL (ref 0.350–4.500)

## 2012-09-30 MED ORDER — HYDROCODONE-ACETAMINOPHEN 5-325 MG PO TABS
1.0000 | ORAL_TABLET | ORAL | Status: DC | PRN
  Filled 2012-09-30: qty 1

## 2012-09-30 MED ORDER — HYDROCODONE-ACETAMINOPHEN 5-325 MG PO TABS: 1.0000 | ORAL_TABLET | Freq: Four times a day (QID) | ORAL | Status: DC | PRN

## 2012-09-30 MED ORDER — VITAMIN D3 25 MCG (1000 UNIT) PO TABS: 1000.0000 [IU] | ORAL_TABLET | Freq: Every day | ORAL | Status: DC

## 2012-09-30 MED ORDER — MAGIC MOUTHWASH: 15.0000 mL | Freq: Three times a day (TID) | ORAL | Status: DC | PRN

## 2012-09-30 MED ORDER — ACETAMINOPHEN 500 MG PO TABS: 500.0000 mg | ORAL_TABLET | Freq: Four times a day (QID) | ORAL | Status: DC

## 2012-09-30 MED ORDER — ADULT MULTIVITAMIN W/MINERALS CH: 1.0000 | ORAL_TABLET | Freq: Every day | ORAL | Status: DC

## 2012-09-30 MED ORDER — POTASSIUM CHLORIDE CRYS ER 20 MEQ PO TBCR: 20.0000 meq | EXTENDED_RELEASE_TABLET | Freq: Two times a day (BID) | ORAL | Status: DC

## 2012-09-30 MED ORDER — CALCIUM CITRATE 950 (200 CA) MG PO TABS: 2.0000 | ORAL_TABLET | Freq: Every day | ORAL | Status: DC

## 2012-09-30 MED ORDER — OXYCODONE HCL 5 MG PO TABS: 5.0000 mg | ORAL_TABLET | Freq: Four times a day (QID) | ORAL | Status: DC | PRN

## 2012-09-30 MED ORDER — VANCOMYCIN HCL IN DEXTROSE 1-5 GM/200ML-% IV SOLN: 1000.0000 mg | INTRAVENOUS | Status: AC

## 2012-09-30 MED ORDER — ENOXAPARIN SODIUM 40 MG/0.4ML ~~LOC~~ SOLN: 40.0000 mg | SUBCUTANEOUS | Status: DC

## 2012-09-30 MED ORDER — DSS 100 MG PO CAPS: 100.0000 mg | ORAL_CAPSULE | Freq: Two times a day (BID) | ORAL | Status: DC

## 2012-09-30 MED ORDER — BOOST / RESOURCE BREEZE PO LIQD: 1.0000 | Freq: Every day | ORAL | Status: DC

## 2012-09-30 NOTE — Progress Notes (Signed)
Per MD- patient is stable for d/c to SNF today- bed offer received and accepted by family/patient for [facility]. DC will be delayed until tomorrow as SNF required patient to be admitted by 5 pm today and CSW was unable to facilitate this due to waiting for completion of d/c summary. Notified pt, family, PA and nursing of d/c delay- d/c packet is prepared and will plan d/c to SNF in the a.m.  Lorri Frederick. [name]  [phone]
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
## 2012-09-30 NOTE — Progress Notes (Addendum)
Orthopedic Tech Progress Note Patient Details:  Ikran Patman [birth year] [id number] Called PA for clarification instructions for leg brace. PA stated he had already called [employer] and spoken with Trey Paula about what he needed.  Patient ID: [name], female   DOB: [birth year], 78 y.o.   MRN: [id number]   Orie Rout 09/30/2012, 10:50 AM   Contacted [facility] for brace, not [facility]  Mearl Latin, PA-C [facility] [phone] (P) 10/01/2012 8:00 AM
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
## 2012-09-30 NOTE — Progress Notes (Signed)
Orthopedic Tech Progress Note Patient Details:  Jill Bowman [birth year] [id number]  Patient ID: Jill Bowman, female   DOB: [birth year], 78 y.o.   MRN: [id number] Brace order completed by [facility]  Nikki Dom 09/30/2012, 3:12 PM

## 2012-09-30 NOTE — Progress Notes (Signed)
Donor site covered with gauze because pt is scratching at wound.

## 2012-10-01 LAB — CBC
MCHC: 33.5 g/dL (ref 30.0–36.0)
MCV: 92.8 fL (ref 78.0–100.0)
Platelets: 282 10*3/uL (ref 150–400)
RDW: 16.4 % — ABNORMAL HIGH (ref 11.5–15.5)
WBC: 6.1 10*3/uL (ref 4.0–10.5)

## 2012-10-01 LAB — BASIC METABOLIC PANEL
Chloride: 102 mEq/L (ref 96–112)
Creatinine, Ser: 0.82 mg/dL (ref 0.50–1.10)
GFR calc Af Amer: 76 mL/min — ABNORMAL LOW (ref >90)
GFR calc non Af Amer: 65 mL/min — ABNORMAL LOW (ref >90)

## 2012-10-01 MED ORDER — HEPARIN SOD (PORK) LOCK FLUSH 100 UNIT/ML IV SOLN
250.0000 [IU] | INTRAVENOUS | Status: DC | PRN
  Administered 2012-10-01: 250 [IU]
  Filled 2012-10-01: qty 3

## 2012-10-01 MED ORDER — HEPARIN SOD (PORK) LOCK FLUSH 100 UNIT/ML IV SOLN
250.0000 [IU] | Freq: Every day | INTRAVENOUS | Status: DC
  Filled 2012-10-01: qty 3

## 2012-10-01 NOTE — Progress Notes (Signed)
Pt discharged to [facility] in [place]. Pt left unit in stable condition with EMS. Report called to nurse Marcelino Duster) and [facility].

## 2012-10-01 NOTE — Progress Notes (Signed)
PT Cancellation Note  Patient Details Name: Jill Bowman MRN: [id number] DOB: 09-Sep-1929   Cancelled Treatment:    Reason Eval/Treat Not Completed: Other (comment) (Pt d/c'd to SNF.)   Yardley Beltran 10/01/2012, 11:45 AM Kayne Yuhas L. Geoffrey Mankin DPT [phone]

## 2012-10-01 NOTE — Progress Notes (Signed)
Utilization review completed. Jakobee Brackins, RN, BSN. 

## 2012-10-01 NOTE — Clinical Social Work Placement (Addendum)
    Clinical Social Work Department CLINICAL SOCIAL WORK PLACEMENT NOTE 10/01/2012  Patient:  Jill Bowman, Jill Bowman  Account Number:  [id number] Admit date:  09/10/2012  Clinical Social Worker:  Lovette Cliche, LCSWA  Date/time:  10/01/2012 10:38 AM  Clinical Social Work is seeking post-discharge placement for this patient at the following level of care:   SKILLED NURSING   (*CSW will update this form in Epic as items are completed)     Patient/family provided with [facility] Department of Clinical Social Work's list of facilities offering this level of care within the geographic area requested by the patient (or if unable, by the patient's family).    Patient/family informed of their freedom to choose among providers that offer the needed level of care, that participate in Medicare, Medicaid or managed care program needed by the patient, have an available bed and are willing to accept the patient.    Patient/family informed of [facility]' ownership interest in [facility], as well as of the fact that they are under no obligation to receive care at this facility.  PASARR submitted to EDS on  PASARR number received from EDS on   FL2 transmitted to all facilities in geographic area requested by pt/family on  09/30/2012 FL2 transmitted to all facilities within larger geographic area on   Patient informed that his/her managed care company has contracts with or will negotiate with  certain facilities, including the following:   Has existing PASARR. Family/ requested change in facility to [facility]. If no bed there- return to [facility].     Patient/family informed of bed offers received:  09/30/2012 Patient chooses bed at [facility], [place] Physician recommends and patient chooses bed at    [facility] [place] on  10/01/2012 Patient to be transferred to facility by [facility])  The following physician  request were entered in Epic:   Additional Comments: DC scheduled for 09/30/12 but MD did not complete d/c summary until very late in the day and facililty would not accept.  DC resheduled for 10/01/12.  Patient and family are pleased with d/c plan.  Notified SNF and pt's nurse- Chari Manning of d/c plan.   Lorri Frederick. Jaci Lazier, LCSWA

## 2012-10-01 NOTE — Progress Notes (Signed)
Utilization review completed. Governor Matos, RN, BSN. 

## 2012-10-02 ENCOUNTER — Encounter (HOSPITAL_COMMUNITY): Payer: Self-pay | Admitting: Orthopedic Surgery

## 2012-10-03 LAB — TYPE AND SCREEN
Antibody Screen: POSITIVE
DAT, IgG: NEGATIVE
Unit division: 0

## 2012-11-08 ENCOUNTER — Other Ambulatory Visit: Payer: Self-pay | Admitting: Internal Medicine

## 2012-11-08 DIAGNOSIS — N6313 Unspecified lump in the right breast, lower outer quadrant: Secondary | ICD-10-CM

## 2012-11-08 DIAGNOSIS — N6314 Unspecified lump in the right breast, lower inner quadrant: Secondary | ICD-10-CM

## 2012-11-18 ENCOUNTER — Other Ambulatory Visit: Payer: Medicare Other

## 2012-11-21 ENCOUNTER — Other Ambulatory Visit: Payer: Medicare Other

## 2012-11-21 ENCOUNTER — Ambulatory Visit
Admission: RE | Admit: 2012-11-21 | Discharge: 2012-11-21 | Disposition: A | Discharge status: Home / Self Care | Payer: Medicare Other | Source: Ambulatory Visit | Attending: Internal Medicine | Admitting: Internal Medicine

## 2012-11-21 DIAGNOSIS — N6314 Unspecified lump in the right breast, lower inner quadrant: Secondary | ICD-10-CM

## 2012-11-21 DIAGNOSIS — N6313 Unspecified lump in the right breast, lower outer quadrant: Secondary | ICD-10-CM

## 2012-12-25 ENCOUNTER — Encounter (HOSPITAL_COMMUNITY): Payer: Self-pay | Admitting: Emergency Medicine

## 2012-12-25 ENCOUNTER — Observation Stay (HOSPITAL_COMMUNITY)
Admission: EM | Admit: 2012-12-25 | Discharge: 2012-12-27 | Disposition: A | Discharge status: Skilled Nursing Facility | Payer: Medicare Other | Attending: Internal Medicine | Admitting: Internal Medicine

## 2012-12-25 ENCOUNTER — Emergency Department (HOSPITAL_COMMUNITY): Payer: Medicare Other

## 2012-12-25 DIAGNOSIS — J9601 Acute respiratory failure with hypoxia: Secondary | ICD-10-CM

## 2012-12-25 DIAGNOSIS — M9712XA Periprosthetic fracture around internal prosthetic left knee joint, initial encounter: Secondary | ICD-10-CM

## 2012-12-25 DIAGNOSIS — S81809A Unspecified open wound, unspecified lower leg, initial encounter: Secondary | ICD-10-CM

## 2012-12-25 DIAGNOSIS — S82252N Displaced comminuted fracture of shaft of left tibia, subsequent encounter for open fracture type IIIA, IIIB, or IIIC with nonunion: Secondary | ICD-10-CM

## 2012-12-25 DIAGNOSIS — E785 Hyperlipidemia, unspecified: Secondary | ICD-10-CM | POA: Insufficient documentation

## 2012-12-25 DIAGNOSIS — R0602 Shortness of breath: Secondary | ICD-10-CM | POA: Insufficient documentation

## 2012-12-25 DIAGNOSIS — S82301N Unspecified fracture of lower end of right tibia, subsequent encounter for open fracture type IIIA, IIIB, or IIIC with nonunion: Secondary | ICD-10-CM

## 2012-12-25 DIAGNOSIS — R079 Chest pain, unspecified: Principal | ICD-10-CM | POA: Insufficient documentation

## 2012-12-25 DIAGNOSIS — F3289 Other specified depressive episodes: Secondary | ICD-10-CM | POA: Insufficient documentation

## 2012-12-25 DIAGNOSIS — I2510 Atherosclerotic heart disease of native coronary artery without angina pectoris: Secondary | ICD-10-CM | POA: Insufficient documentation

## 2012-12-25 DIAGNOSIS — M869 Osteomyelitis, unspecified: Secondary | ICD-10-CM

## 2012-12-25 DIAGNOSIS — S82202B Unspecified fracture of shaft of left tibia, initial encounter for open fracture type I or II: Secondary | ICD-10-CM

## 2012-12-25 DIAGNOSIS — M9711XA Periprosthetic fracture around internal prosthetic right knee joint, initial encounter: Secondary | ICD-10-CM

## 2012-12-25 DIAGNOSIS — S92309A Fracture of unspecified metatarsal bone(s), unspecified foot, initial encounter for closed fracture: Secondary | ICD-10-CM

## 2012-12-25 DIAGNOSIS — R5381 Other malaise: Secondary | ICD-10-CM

## 2012-12-25 DIAGNOSIS — R109 Unspecified abdominal pain: Secondary | ICD-10-CM | POA: Insufficient documentation

## 2012-12-25 DIAGNOSIS — I495 Sick sinus syndrome: Secondary | ICD-10-CM | POA: Insufficient documentation

## 2012-12-25 DIAGNOSIS — F329 Major depressive disorder, single episode, unspecified: Secondary | ICD-10-CM | POA: Insufficient documentation

## 2012-12-25 DIAGNOSIS — S82871C Displaced pilon fracture of right tibia, initial encounter for open fracture type IIIA, IIIB, or IIIC: Secondary | ICD-10-CM

## 2012-12-25 DIAGNOSIS — I10 Essential (primary) hypertension: Secondary | ICD-10-CM | POA: Insufficient documentation

## 2012-12-25 DIAGNOSIS — M6250 Muscle wasting and atrophy, not elsewhere classified, unspecified site: Secondary | ICD-10-CM | POA: Insufficient documentation

## 2012-12-25 DIAGNOSIS — Z22322 Carrier or suspected carrier of Methicillin resistant Staphylococcus aureus: Secondary | ICD-10-CM

## 2012-12-25 DIAGNOSIS — Z950 Presence of cardiac pacemaker: Secondary | ICD-10-CM | POA: Insufficient documentation

## 2012-12-25 DIAGNOSIS — L089 Local infection of the skin and subcutaneous tissue, unspecified: Secondary | ICD-10-CM

## 2012-12-25 LAB — URINALYSIS, ROUTINE W REFLEX MICROSCOPIC
Bilirubin Urine: NEGATIVE
Hgb urine dipstick: NEGATIVE
Specific Gravity, Urine: 1.023 (ref 1.005–1.030)
Urobilinogen, UA: 1 mg/dL (ref 0.0–1.0)

## 2012-12-25 LAB — POCT I-STAT TROPONIN I
Troponin i, poc: 0 ng/mL (ref 0.00–0.08)
Troponin i, poc: 0 ng/mL (ref 0.00–0.08)

## 2012-12-25 LAB — COMPREHENSIVE METABOLIC PANEL
ALT: 22 U/L (ref 0–35)
AST: 34 U/L (ref 0–37)
CO2: 26 mEq/L (ref 19–32)
Calcium: 9.3 mg/dL (ref 8.4–10.5)
Chloride: 103 mEq/L (ref 96–112)
GFR calc non Af Amer: 56 mL/min — ABNORMAL LOW (ref >90)
Potassium: 4.1 mEq/L (ref 3.5–5.1)
Sodium: 139 mEq/L (ref 135–145)
Total Bilirubin: 0.3 mg/dL (ref 0.3–1.2)

## 2012-12-25 LAB — CBC WITH DIFFERENTIAL/PLATELET
Basophils Absolute: 0 10*3/uL (ref 0.0–0.1)
Lymphocytes Relative: 22 % (ref 12–46)
Neutro Abs: 7.1 10*3/uL (ref 1.7–7.7)
Neutrophils Relative %: 67 % (ref 43–77)
Platelets: 252 10*3/uL (ref 150–400)
RDW: 13.4 % (ref 11.5–15.5)
WBC: 10.6 10*3/uL — ABNORMAL HIGH (ref 4.0–10.5)

## 2012-12-25 LAB — D-DIMER, QUANTITATIVE: D-Dimer, Quant: 2.17 ug/mL-FEU — ABNORMAL HIGH (ref 0.00–0.48)

## 2012-12-25 MED ORDER — IOHEXOL 350 MG/ML SOLN
100.0000 mL | Freq: Once | INTRAVENOUS | Status: AC | PRN
  Administered 2012-12-25: 100 mL via INTRAVENOUS
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
## 2012-12-25 NOTE — ED Notes (Signed)
Kathryne Gin, RN from [facility] facility update on patients' condition.

## 2012-12-25 NOTE — ED Notes (Signed)
IV team at bedside 

## 2012-12-26 ENCOUNTER — Encounter (HOSPITAL_COMMUNITY): Payer: Self-pay | Admitting: General Practice

## 2012-12-26 DIAGNOSIS — Z22322 Carrier or suspected carrier of Methicillin resistant Staphylococcus aureus: Secondary | ICD-10-CM

## 2012-12-26 DIAGNOSIS — I2510 Atherosclerotic heart disease of native coronary artery without angina pectoris: Secondary | ICD-10-CM

## 2012-12-26 DIAGNOSIS — R079 Chest pain, unspecified: Secondary | ICD-10-CM | POA: Diagnosis present

## 2012-12-26 DIAGNOSIS — R5381 Other malaise: Secondary | ICD-10-CM

## 2012-12-26 DIAGNOSIS — Z950 Presence of cardiac pacemaker: Secondary | ICD-10-CM

## 2012-12-26 LAB — CBC
HCT: 36.4 % (ref 36.0–46.0)
Hemoglobin: 12.1 g/dL (ref 12.0–15.0)
MCHC: 33.2 g/dL (ref 30.0–36.0)
WBC: 7 10*3/uL (ref 4.0–10.5)

## 2012-12-26 LAB — CREATININE, SERUM
GFR calc Af Amer: 70 mL/min — ABNORMAL LOW (ref >90)
GFR calc non Af Amer: 61 mL/min — ABNORMAL LOW (ref >90)

## 2012-12-26 LAB — TROPONIN I
Troponin I: <0.3 ng/mL (ref <0.30)
Troponin I: <0.3 ng/mL (ref <0.30)

## 2012-12-26 LAB — GLUCOSE, CAPILLARY: Glucose-Capillary: 84 mg/dL (ref 70–99)

## 2012-12-26 MED ORDER — LORATADINE 10 MG PO TABS
10.0000 mg | ORAL_TABLET | Freq: Every day | ORAL | Status: DC
  Administered 2012-12-26 – 2012-12-27 (×2): 10 mg via ORAL
  Filled 2012-12-26 (×2): qty 1

## 2012-12-26 MED ORDER — PANTOPRAZOLE SODIUM 40 MG PO TBEC
40.0000 mg | DELAYED_RELEASE_TABLET | Freq: Every day | ORAL | Status: DC
  Administered 2012-12-26 – 2012-12-27 (×2): 40 mg via ORAL
  Filled 2012-12-26 (×2): qty 1

## 2012-12-26 MED ORDER — LORAZEPAM 0.5 MG PO TABS: 0.5000 mg | ORAL_TABLET | Freq: Three times a day (TID) | ORAL | Status: DC | PRN

## 2012-12-26 MED ORDER — NITROGLYCERIN 0.4 MG SL SUBL
SUBLINGUAL_TABLET | SUBLINGUAL | Status: AC
  Administered 2012-12-26: 03:00:00
  Filled 2012-12-26: qty 25

## 2012-12-26 MED ORDER — SODIUM CHLORIDE 0.9 % IV SOLN: 250.0000 mL | INTRAVENOUS | Status: DC | PRN

## 2012-12-26 MED ORDER — SODIUM CHLORIDE 0.9 % IJ SOLN
3.0000 mL | Freq: Two times a day (BID) | INTRAMUSCULAR | Status: DC
  Administered 2012-12-26: 3 mL via INTRAVENOUS

## 2012-12-26 MED ORDER — GABAPENTIN 300 MG PO CAPS
300.0000 mg | ORAL_CAPSULE | Freq: Every day | ORAL | Status: DC
  Administered 2012-12-26: 300 mg via ORAL
  Filled 2012-12-26 (×2): qty 1

## 2012-12-26 MED ORDER — METOPROLOL SUCCINATE ER 25 MG PO TB24
25.0000 mg | ORAL_TABLET | Freq: Every day | ORAL | Status: DC
  Administered 2012-12-26 – 2012-12-27 (×2): 25 mg via ORAL
  Filled 2012-12-26 (×2): qty 1

## 2012-12-26 MED ORDER — MUPIROCIN 2 % EX OINT
1.0000 "application " | TOPICAL_OINTMENT | Freq: Two times a day (BID) | CUTANEOUS | Status: DC
  Administered 2012-12-26 – 2012-12-27 (×3): 1 via NASAL
  Filled 2012-12-26: qty 22

## 2012-12-26 MED ORDER — SERTRALINE HCL 25 MG PO TABS
25.0000 mg | ORAL_TABLET | Freq: Every day | ORAL | Status: DC
  Administered 2012-12-26 – 2012-12-27 (×2): 25 mg via ORAL
  Filled 2012-12-26 (×2): qty 1

## 2012-12-26 MED ORDER — LORAZEPAM 2 MG/ML IJ SOLN
0.5000 mg | Freq: Once | INTRAMUSCULAR | Status: AC
  Administered 2012-12-26: 0.5 mg via INTRAVENOUS
  Filled 2012-12-26: qty 1

## 2012-12-26 MED ORDER — AMIODARONE HCL 100 MG PO TABS
100.0000 mg | ORAL_TABLET | Freq: Every day | ORAL | Status: DC
  Administered 2012-12-26 – 2012-12-27 (×2): 100 mg via ORAL
  Filled 2012-12-26 (×3): qty 1

## 2012-12-26 MED ORDER — CHLORHEXIDINE GLUCONATE CLOTH 2 % EX PADS
6.0000 | MEDICATED_PAD | Freq: Every day | CUTANEOUS | Status: DC
  Administered 2012-12-26 – 2012-12-27 (×2): 6 via TOPICAL

## 2012-12-26 MED ORDER — SODIUM CHLORIDE 0.9 % IJ SOLN: 3.0000 mL | INTRAMUSCULAR | Status: DC | PRN

## 2012-12-26 MED ORDER — ASPIRIN 81 MG PO CHEW
81.0000 mg | CHEWABLE_TABLET | Freq: Every day | ORAL | Status: DC
  Administered 2012-12-26 – 2012-12-27 (×2): 81 mg via ORAL
  Filled 2012-12-26 (×2): qty 1

## 2012-12-26 MED ORDER — SODIUM CHLORIDE 0.9 % IJ SOLN
3.0000 mL | Freq: Two times a day (BID) | INTRAMUSCULAR | Status: DC
  Administered 2012-12-26 – 2012-12-27 (×2): 3 mL via INTRAVENOUS

## 2012-12-26 MED ORDER — OXYCODONE HCL 5 MG PO TABS
5.0000 mg | ORAL_TABLET | Freq: Four times a day (QID) | ORAL | Status: DC | PRN
  Administered 2012-12-26 – 2012-12-27 (×4): 10 mg via ORAL
  Filled 2012-12-26 (×4): qty 2

## 2012-12-26 MED ORDER — ENOXAPARIN SODIUM 40 MG/0.4ML ~~LOC~~ SOLN
40.0000 mg | SUBCUTANEOUS | Status: DC
  Administered 2012-12-26 – 2012-12-27 (×2): 40 mg via SUBCUTANEOUS
  Filled 2012-12-26 (×3): qty 0.4

## 2012-12-26 MED ORDER — LORAZEPAM 1 MG PO TABS: 1.0000 mg | ORAL_TABLET | Freq: Every day | ORAL | Status: DC

## 2012-12-26 NOTE — Progress Notes (Addendum)
[facility] PROGRESS NOTE  Jill Bowman YQM:[id number] DOB: 12-29-1928 DOA: 12/25/2012 PCP: Default, Provider, MD  Assessment/Plan: 1-Chest pain: atypical in nature; most likely due to GERD.  -Follow cardiac enzymes -Follow 2-D echo -No telemetry or EKG acute abnormalities.  2-hx oc BMW:[id number] ASA and b-blockers  3-Hx of atrial fibrillation: rate controlled. Continue b-blocker and amiodarone. Not CP or SOB this morning.  4-GERD: continue PPI  5-MRSA: will treat as per protocol orders for decolonization.  6-HTN: well controlled; continue current   7-Depression/Anxiety: will continue zoloft and PO ativan as needed.  8-allergic rhinitis: continue claritin.  9-Physical deconditioning: will reassess with PT; patient from SNF; most likely back to SNF if needed.  UUV:[id number].   Code Status: Full Family Communication: no family at bedside Disposition Plan: back to SNF  ([facility] at discharge).   Consultants:  PT/OT  Procedures:  2- D echo pending  Antibiotics:  none  HPI/Subjective: AAOX3, no CP, no SOB, afebrile.  Objective: Filed Vitals:   12/25/12 2345 12/26/12 0000 12/26/12 0302 12/26/12 0319  BP: 143/64 110/59 134/55 104/65  Pulse: 59 59 69 65  Temp:   98.6 F (37 C)   TempSrc:   Oral   Resp: 8 14 18    Height:   5\' 7"  (1.702 m)   Weight:   94.167 kg (207 lb 9.6 oz)   SpO2: 100% 100% 100%    No intake or output data in the 24 hours ending 12/26/12 0920 Filed Weights   12/26/12 0302  Weight: 94.167 kg (207 lb 9.6 oz)    Exam:   General:  AAOX3, no CP, no SOB, afebrile and w/o acute complaints.  Cardiovascular: s1 and S2, no rubs or gallops  Respiratory: CTA, bilaterally  Abdomen: soft, non tender, non distended; positive BS  Neuro: non focal.  Data Reviewed: Basic Metabolic Panel:  Lab 12/26/12 0347 12/25/12 1828  NA -- 139  K -- 4.1  CL -- 103  CO2 -- 26  GLUCOSE -- 92  BUN -- 30*  CREATININE 0.87 0.93  CALCIUM -- 9.3   MG -- --  PHOS -- --   Liver Function Tests:  Lab 12/25/12 1828  AST 34  ALT 22  ALKPHOS 129*  BILITOT 0.3  PROT 7.1  ALBUMIN 3.3*   CBC:  Lab 12/26/12 0313 12/25/12 1828  WBC 7.0 10.6*  NEUTROABS -- 7.1  HGB 12.1 12.9  HCT 36.4 37.7  MCV 92.6 93.1  PLT 251 252   Cardiac Enzymes:  Lab 12/26/12 0313  CKTOTAL --  CKMB --  CKMBINDEX --  TROPONINI <0.30     Recent Results (from the past 240 hour(s))  MRSA PCR SCREENING     Status: Abnormal   Collection Time   12/26/12  3:05 AM      Component Value Range Status Comment   MRSA by PCR POSITIVE (*) NEGATIVE Final      Studies: Dg Chest 2 View  12/25/2012  *RADIOLOGY REPORT*  Clinical Data: Chest pain and shortness of breath.  CHEST - 2 VIEW  Comparison: 09/16/2012 chest radiograph  Findings: Upper limits normal heart size and left-sided pacemaker again noted. There is no evidence of focal airspace disease, pulmonary edema, suspicious pulmonary nodule/mass, pleural effusion, or pneumothorax. No acute bony abnormalities are identified. Moderate thoracic degenerative disc disease and spondylosis noted.  IMPRESSION: No evidence of acute cardiopulmonary disease.   Original Report Authenticated By: Harmon Pier, M.D.    Ct Angio Chest W/cm &/or Wo Cm  12/25/2012  *RADIOLOGY  REPORT*  Clinical Data: 79 year old female with chest pain and shortness of breath.  CT ANGIOGRAPHY CHEST  Technique:  Multidetector CT imaging of the chest using the standard protocol during bolus administration of intravenous contrast. Multiplanar reconstructed images including MIPs were obtained and reviewed to evaluate the vascular anatomy.  Contrast: OMNIPAQUE IOHEXOL 350 MG/ML SOLN  Comparison: 12/25/2012 chest radiograph  Findings: This is a technically adequate study but respiratory motion artifact in the right lower lung slightly limits evaluation.  No pulmonary emboli are identified. A left-sided pacemaker and moderate coronary artery calcifications  are present. No pleural or pericardial effusions are identified. No enlarged lymph nodes are present. There is no evidence of thoracic aortic aneurysm.  There is no evidence of airspace disease, consolidation, suspicious nodule, mass, or endobronchial or endotracheal lesion. Mild scarring at the left lung base is noted. No acute or suspicious bony abnormalities are identified. A large hiatal hernia is noted.  IMPRESSION: No evidence of acute abnormality.  No evidence of pulmonary emboli.  Coronary artery disease.  Large hiatal hernia.   Original Report Authenticated By: Harmon Pier, M.D.     Scheduled Meds:   . amiodarone  100 mg Oral Daily  . aspirin  81 mg Oral Daily  . enoxaparin (LOVENOX) injection  40 mg Subcutaneous Q24H  . gabapentin  300 mg Oral QHS  . loratadine  10 mg Oral Daily  . metoprolol succinate  25 mg Oral Daily  . pantoprazole  40 mg Oral Q1200  . sertraline  25 mg Oral Daily  . sodium chloride  3 mL Intravenous Q12H  . sodium chloride  3 mL Intravenous Q12H   Continuous Infusions:   Principal Problem:  *Chest pain Active Problems:  Pacemaker  CAD (coronary artery disease)  Morbid obesity  Hyperlipidemia  Physical deconditioning    Time spent: >30 minutes    Jill Bowman  [facility] Pager [phone]. If 8PM-8AM, please contact night-coverage at [email or web address], password [facility] 12/26/2012, 9:20 AM  LOS: 1 day
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
## 2012-12-26 NOTE — Care Management Note (Signed)
    Page 1 of 1   12/26/2012     10:26:03 AM   CARE MANAGEMENT NOTE 12/26/2012  Patient:  Jill Bowman, Jill Bowman   Account Number:  [id number]  Date Initiated:  12/26/2012  Documentation initiated by:  GRAVES-BIGELOW,Davia Smyre  Subjective/Objective Assessment:   Pt admitted from [facility] with cp. Plan to return to [facility] when medically stable.     Action/Plan:   No needs from CM at this time. CSW will facilitate disposition needs for pt.   Anticipated DC Date:  12/27/2012   Anticipated DC Plan:  SKILLED NURSING FACILITY      DC Planning Services  CM consult      Choice offered to / List presented to:             Status of service:  Completed, signed off Medicare Important Message given?   (If response is "NO", the following Medicare IM given date fields will be blank) Date Medicare IM given:   Date Additional Medicare IM given:    Discharge Disposition:    Per UR Regulation:  Reviewed for med. necessity/level of care/duration of stay  If discussed at Long Length of Stay Meetings, dates discussed:    Comments:

## 2012-12-26 NOTE — Progress Notes (Signed)
Pt. Complained of chest pain, rating it 10/10. 1 nitro was given, EKG obtained, NP on call notified. New orders obtained for 0.5 ativan. Will continue to monitor.
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
## 2012-12-26 NOTE — Progress Notes (Signed)
  Echocardiogram 2D Echocardiogram has been performed.  Jill Bowman FRANCES 12/26/2012, 6:17 PM 

## 2012-12-26 NOTE — Progress Notes (Signed)
UR Completed Laquita Harlan Graves-Bigelow, RN,BSN [phone]  

## 2012-12-26 NOTE — ED Provider Notes (Signed)
Medical screening examination/treatment/procedure(s) were conducted as a shared visit with non-physician practitioner(s) and myself.  I personally evaluated the patient during the encounter See my additional note  Glynn Octave, MD 12/26/12 0145

## 2012-12-27 DIAGNOSIS — I10 Essential (primary) hypertension: Secondary | ICD-10-CM

## 2012-12-27 DIAGNOSIS — I495 Sick sinus syndrome: Secondary | ICD-10-CM

## 2012-12-27 MED ORDER — LORAZEPAM 1 MG PO TABS: 0.5000 mg | ORAL_TABLET | Freq: Three times a day (TID) | ORAL | Status: DC | PRN

## 2012-12-27 MED ORDER — PANTOPRAZOLE SODIUM 40 MG PO TBEC: 40.0000 mg | DELAYED_RELEASE_TABLET | Freq: Every day | ORAL | Status: DC

## 2012-12-27 MED ORDER — OXYCODONE HCL 5 MG PO TABS: 5.0000 mg | ORAL_TABLET | Freq: Four times a day (QID) | ORAL | Status: DC | PRN

## 2012-12-27 MED ORDER — TRAMADOL HCL 50 MG PO TABS: 100.0000 mg | ORAL_TABLET | Freq: Four times a day (QID) | ORAL | Status: DC | PRN

## 2012-12-27 NOTE — Progress Notes (Signed)
D/c orders received;IV removed with gauze on, pt remains in stable condition, pt meds and instructions reviewed and given to pt; report called to [facility], awaiting for transportation to SNF

## 2012-12-27 NOTE — Progress Notes (Signed)
Patient for d/c today to SNF bed at  [facility] as prior to admit. Patient agreeable to this plan- plan transfer via EMS. Reece Levy, MSW, [facility]ors [phone]
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
## 2012-12-27 NOTE — Discharge Summary (Addendum)
Physician Discharge Summary  Jill Bowman XBJ:[id number] DOB: [birth year] DOA: 12/25/2012  PCP: Default, Provider, MD  Admit date: 12/25/2012 Discharge date: 12/27/2012  Time spent: >30 minutes  Recommendations for Outpatient Follow-up:  Check BMET to follow electrolytes and kidney function Reevaluate BP and adjust medication as needed Keep patient well hydrated. Follow 2-D echo results  Discharge Diagnoses:  Principal Problem:  *Chest pain Active Problems:  Pacemaker  CAD (coronary artery disease)  Morbid obesity  Hyperlipidemia  Physical deconditioning   Discharge Condition: stable and improved. No CP or SOB at discharge. Will go back to SNF for further rehab and conditioning.   Diet recommendation: heart healthy  Filed Weights   12/26/12 0302  Weight: 94.167 kg (207 lb 9.6 oz)    History of present illness:  79 yo female with pain under her left breast with no associated n/v/sob. Pain is reproducible with palpation. Just had ortho surgery and is in rehab. Pain is resolved unless you press under her left breast. Denies any fevers or le edema or swelling.  Hospital Course:  1-Chest pain: atypical and non cardiac; most likely due to GERD. CE'z negative X 3, no ischemic changes on telemetry or EKG. Echo done but pending results at discharge. Patient has now been pain free since she was started on PPI; making her heart and prior CAD less likely cause for CP.  2-GERD: continue PPI  3-HLD: continue statins  4-HTN: stable. Continue current regimen.  5-Tacky-brady syndrome: will continue pacerone. Patient is well controlled and s/p pacemaker implantation.  6-depression: continue sertraline  7-physical deconditioning: will return to SNF for conditioning and rehabilitation.  Rest of medical problems remains stable and the plan is to resume home medication regimen and to follow with doctor at facility.  Procedures: 2-D echo:  pending  Consultations:  none  Discharge Exam: Filed Vitals:   12/26/12 1500 12/26/12 2057 12/26/12 2109 12/27/12 1007  BP: 111/58 128/67 100/43 109/57  Pulse: 62 78 79 68  Temp: 98.6 F (37 C) 98.2 F (36.8 C) 97.7 F (36.5 C)   TempSrc:  Oral Oral   Resp: 17     Height:      Weight:      SpO2: 95% 97% 97%     General: NAD, afebrile, denies CP or SOB Cardiovascular: s1 and S2, per telemetry ventricular paced; no rubs or gallops Respiratory: CTA bilaterally Abdomen: soft, NT, ND, positive BS Neuro: non focal deficit.  Discharge Instructions  Discharge Orders    Future Orders Please Complete By Expires   Diet - low sodium heart healthy      Discharge instructions      Comments:   -Take medications as prescribed -Keep patient well hydrated -Continue PT/OT as previously recommended at the facility for rehabilitation.       Medication List     As of 12/27/2012 12:26 PM    STOP taking these medications         enoxaparin 40 MG/0.4ML injection   Commonly known as: LOVENOX      TAKE these medications         acetaminophen 500 MG tablet   Commonly known as: TYLENOL   Take 1 tablet (500 mg total) by mouth every 6 (six) hours.      amiodarone 200 MG tablet   Commonly known as: PACERONE   Take 100 mg by mouth daily.      aspirin 81 MG chewable tablet   Chew 81 mg by mouth daily.  calcium citrate 950 MG tablet   Commonly known as: CALCITRATE - dosed in mg elemental calcium   Take 2 tablets (400 mg of elemental calcium total) by mouth daily.      cholecalciferol 1000 UNITS tablet   Commonly known as: VITAMIN D   Take 1 tablet (1,000 Units total) by mouth daily.      cycloSPORINE 0.05 % ophthalmic emulsion   Commonly known as: RESTASIS   1 drop 2 (two) times daily.      DSS 100 MG Caps   Take 100 mg by mouth 2 (two) times daily.      feeding supplement Liqd   Take 1 Container by mouth daily.      feeding supplement Liqd   Take 30 mLs by mouth 2  (two) times daily.      gabapentin 300 MG capsule   Commonly known as: NEURONTIN   Take 300 mg by mouth at bedtime.      loratadine 10 MG tablet   Commonly known as: CLARITIN   Take 10 mg by mouth daily.      LORazepam 1 MG tablet   Commonly known as: ATIVAN   Take 0.5 tablets (0.5 mg total) by mouth every 8 (eight) hours as needed for anxiety.      metoprolol succinate 25 MG 24 hr tablet   Commonly known as: TOPROL-XL   Take 25 mg by mouth daily.      multivitamin with minerals Tabs   Take 1 tablet by mouth daily.      oxyCODONE 5 MG immediate release tablet   Commonly known as: Oxy IR/ROXICODONE   Take 1-2 tablets (5-10 mg total) by mouth every 6 (six) hours as needed for pain.      pantoprazole 40 MG tablet   Commonly known as: PROTONIX   Take 1 tablet (40 mg total) by mouth daily at 12 noon.      pravastatin 80 MG tablet   Commonly known as: PRAVACHOL   Take 80 mg by mouth daily.      senna 8.6 MG Tabs   Commonly known as: SENOKOT   Take 1 tablet by mouth at bedtime.      sertraline 25 MG tablet   Commonly known as: ZOLOFT   Take 25 mg by mouth daily.      traMADol 50 MG tablet   Commonly known as: ULTRAM   Take 2 tablets (100 mg total) by mouth every 6 (six) hours as needed. Pain      vitamin C 500 MG tablet   Commonly known as: ASCORBIC ACID   Take 500 mg by mouth 2 (two) times daily.          The results of significant diagnostics from this hospitalization (including imaging, microbiology, ancillary and laboratory) are listed below for reference.    Significant Diagnostic Studies: Dg Chest 2 View  12/25/2012  *RADIOLOGY REPORT*  Clinical Data: Chest pain and shortness of breath.  CHEST - 2 VIEW  Comparison: 09/16/2012 chest radiograph  Findings: Upper limits normal heart size and left-sided pacemaker again noted. There is no evidence of focal airspace disease, pulmonary edema, suspicious pulmonary nodule/mass, pleural effusion, or pneumothorax. No  acute bony abnormalities are identified. Moderate thoracic degenerative disc disease and spondylosis noted.  IMPRESSION: No evidence of acute cardiopulmonary disease.   Original Report Authenticated By: Harmon Pier, M.D.    Ct Angio Chest W/cm &/or Wo Cm  12/25/2012  *RADIOLOGY REPORT*  Clinical Data: 79 year old female with  chest pain and shortness of breath.  CT ANGIOGRAPHY CHEST  Technique:  Multidetector CT imaging of the chest using the standard protocol during bolus administration of intravenous contrast. Multiplanar reconstructed images including MIPs were obtained and reviewed to evaluate the vascular anatomy.  Contrast: OMNIPAQUE IOHEXOL 350 MG/ML SOLN  Comparison: 12/25/2012 chest radiograph  Findings: This is a technically adequate study but respiratory motion artifact in the right lower lung slightly limits evaluation.  No pulmonary emboli are identified. A left-sided pacemaker and moderate coronary artery calcifications are present. No pleural or pericardial effusions are identified. No enlarged lymph nodes are present. There is no evidence of thoracic aortic aneurysm.  There is no evidence of airspace disease, consolidation, suspicious nodule, mass, or endobronchial or endotracheal lesion. Mild scarring at the left lung base is noted. No acute or suspicious bony abnormalities are identified. A large hiatal hernia is noted.  IMPRESSION: No evidence of acute abnormality.  No evidence of pulmonary emboli.  Coronary artery disease.  Large hiatal hernia.   Original Report Authenticated By: Harmon Pier, M.D.     Microbiology: Recent Results (from the past 240 hour(s))  MRSA PCR SCREENING     Status: Abnormal   Collection Time   12/26/12  3:05 AM      Component Value Range Status Comment   MRSA by PCR POSITIVE (*) NEGATIVE Final      Labs: Basic Metabolic Panel:  Lab 12/26/12 7829 12/25/12 1828  NA -- 139  K -- 4.1  CL -- 103  CO2 -- 26  GLUCOSE -- 92  BUN -- 30*  CREATININE 0.87  0.93  CALCIUM -- 9.3  MG -- --  PHOS -- --   Liver Function Tests:  Lab 12/25/12 1828  AST 34  ALT 22  ALKPHOS 129*  BILITOT 0.3  PROT 7.1  ALBUMIN 3.3*   CBC:  Lab 12/26/12 0313 12/25/12 1828  WBC 7.0 10.6*  NEUTROABS -- 7.1  HGB 12.1 12.9  HCT 36.4 37.7  MCV 92.6 93.1  PLT 251 252   Cardiac Enzymes:  Lab 12/26/12 1509 12/26/12 0900 12/26/12 0313  CKTOTAL -- -- --  CKMB -- -- --  CKMBINDEX -- -- --  TROPONINI <0.30 <0.30 <0.30   CBG:  Lab 12/26/12 0739  GLUCAP 84     Signed:  Adelynn Gipe  [facility] 12/27/2012, 12:26 PM

## 2013-04-24 ENCOUNTER — Other Ambulatory Visit: Payer: Self-pay | Admitting: Orthopedic Surgery

## 2013-04-24 ENCOUNTER — Ambulatory Visit
Admission: RE | Admit: 2013-04-24 | Discharge: 2013-04-24 | Disposition: A | Discharge status: Home / Self Care | Payer: Medicare Other | Source: Ambulatory Visit | Attending: Orthopedic Surgery | Admitting: Orthopedic Surgery

## 2013-04-24 DIAGNOSIS — M25571 Pain in right ankle and joints of right foot: Secondary | ICD-10-CM

## 2013-05-01 ENCOUNTER — Encounter (HOSPITAL_COMMUNITY): Payer: Self-pay | Admitting: *Deleted

## 2013-05-01 ENCOUNTER — Encounter (HOSPITAL_COMMUNITY): Payer: Self-pay | Admitting: Pharmacy Technician

## 2013-05-01 MED ORDER — VANCOMYCIN HCL IN DEXTROSE 1-5 GM/200ML-% IV SOLN
1000.0000 mg | INTRAVENOUS | Status: AC
  Administered 2013-05-02: 1000 mg via INTRAVENOUS
  Filled 2013-05-01: qty 200

## 2013-05-01 NOTE — Progress Notes (Signed)
Pt daughter Jill Bowman states that the pt had an EKG and chest x ray over a week ago by [facility] by Alcide Clever CRNP; will bring copy of EKG on day of surgery. Daughter states that Dr. Jacinto Halim is pt cardiologist and he last checked her pacemaker" around three months ago." A request for peri-op prescription for pacemaker programming was faxed after 7:00PM when office was closed. A fax was still sent; an attempt will be made to get pacemaker orders on day of surgery.

## 2013-05-02 ENCOUNTER — Ambulatory Visit (HOSPITAL_COMMUNITY): Payer: Medicare Other

## 2013-05-02 ENCOUNTER — Encounter (HOSPITAL_COMMUNITY): Payer: Self-pay | Admitting: *Deleted

## 2013-05-02 ENCOUNTER — Inpatient Hospital Stay (HOSPITAL_COMMUNITY)
Admission: RE | Admit: 2013-05-02 | Discharge: 2013-05-04 | DRG: 493 | Disposition: A | Discharge status: Home Health | Payer: Medicare Other | Source: Ambulatory Visit | Attending: Orthopedic Surgery | Admitting: Orthopedic Surgery

## 2013-05-02 ENCOUNTER — Encounter (HOSPITAL_COMMUNITY)
Admission: RE | Disposition: A | Discharge status: Home Health | Payer: Self-pay | Source: Ambulatory Visit | Attending: Orthopedic Surgery

## 2013-05-02 ENCOUNTER — Inpatient Hospital Stay (HOSPITAL_COMMUNITY): Payer: Medicare Other

## 2013-05-02 ENCOUNTER — Ambulatory Visit (HOSPITAL_COMMUNITY): Payer: Medicare Other | Admitting: Anesthesiology

## 2013-05-02 ENCOUNTER — Encounter (HOSPITAL_COMMUNITY): Payer: Self-pay | Admitting: Anesthesiology

## 2013-05-02 DIAGNOSIS — Y831 Surgical operation with implant of artificial internal device as the cause of abnormal reaction of the patient, or of later complication, without mention of misadventure at the time of the procedure: Secondary | ICD-10-CM | POA: Diagnosis present

## 2013-05-02 DIAGNOSIS — I2510 Atherosclerotic heart disease of native coronary artery without angina pectoris: Secondary | ICD-10-CM | POA: Diagnosis present

## 2013-05-02 DIAGNOSIS — Z950 Presence of cardiac pacemaker: Secondary | ICD-10-CM

## 2013-05-02 DIAGNOSIS — T84498A Other mechanical complication of other internal orthopedic devices, implants and grafts, initial encounter: Principal | ICD-10-CM | POA: Diagnosis present

## 2013-05-02 DIAGNOSIS — E7800 Pure hypercholesterolemia, unspecified: Secondary | ICD-10-CM | POA: Diagnosis present

## 2013-05-02 DIAGNOSIS — M129 Arthropathy, unspecified: Secondary | ICD-10-CM | POA: Diagnosis present

## 2013-05-02 DIAGNOSIS — Z87891 Personal history of nicotine dependence: Secondary | ICD-10-CM

## 2013-05-02 DIAGNOSIS — IMO0002 Reserved for concepts with insufficient information to code with codable children: Secondary | ICD-10-CM | POA: Diagnosis present

## 2013-05-02 DIAGNOSIS — Z6841 Body Mass Index (BMI) 40.0 and over, adult: Secondary | ICD-10-CM

## 2013-05-02 DIAGNOSIS — R5381 Other malaise: Secondary | ICD-10-CM | POA: Diagnosis present

## 2013-05-02 DIAGNOSIS — I10 Essential (primary) hypertension: Secondary | ICD-10-CM | POA: Diagnosis present

## 2013-05-02 DIAGNOSIS — Z96659 Presence of unspecified artificial knee joint: Secondary | ICD-10-CM

## 2013-05-02 HISTORY — PX: ORIF TIBIA FRACTURE: SHX5416

## 2013-05-02 LAB — URINALYSIS, ROUTINE W REFLEX MICROSCOPIC
Ketones, ur: NEGATIVE mg/dL
Nitrite: NEGATIVE
Protein, ur: NEGATIVE mg/dL
Urobilinogen, UA: 0.2 mg/dL (ref 0.0–1.0)

## 2013-05-02 LAB — URINE MICROSCOPIC-ADD ON

## 2013-05-02 LAB — CBC WITH DIFFERENTIAL/PLATELET
Basophils Absolute: 0 10*3/uL (ref 0.0–0.1)
Eosinophils Absolute: 0.7 10*3/uL (ref 0.0–0.7)
Eosinophils Relative: 8 % — ABNORMAL HIGH (ref 0–5)
Lymphocytes Relative: 20 % (ref 12–46)
MCV: 92.6 fL (ref 78.0–100.0)
Neutrophils Relative %: 65 % (ref 43–77)
Platelets: 251 10*3/uL (ref 150–400)
RBC: 4.08 MIL/uL (ref 3.87–5.11)
RDW: 13.7 % (ref 11.5–15.5)
WBC: 8.9 10*3/uL (ref 4.0–10.5)

## 2013-05-02 LAB — COMPREHENSIVE METABOLIC PANEL
ALT: 9 U/L (ref 0–35)
AST: 22 U/L (ref 0–37)
Alkaline Phosphatase: 186 U/L — ABNORMAL HIGH (ref 39–117)
CO2: 24 mEq/L (ref 19–32)
Calcium: 9.4 mg/dL (ref 8.4–10.5)
Potassium: 4.5 mEq/L (ref 3.5–5.1)
Sodium: 138 mEq/L (ref 135–145)
Total Protein: 7.4 g/dL (ref 6.0–8.3)

## 2013-05-02 LAB — GRAM STAIN

## 2013-05-02 LAB — GLUCOSE, CAPILLARY
Glucose-Capillary: 69 mg/dL — ABNORMAL LOW (ref 70–99)
Glucose-Capillary: 84 mg/dL (ref 70–99)

## 2013-05-02 LAB — PROTIME-INR
INR: 1.05 (ref 0.00–1.49)
Prothrombin Time: 13.6 seconds (ref 11.6–15.2)

## 2013-05-02 LAB — SURGICAL PCR SCREEN: MRSA, PCR: POSITIVE — AB

## 2013-05-02 LAB — APTT: aPTT: 34 seconds (ref 24–37)

## 2013-05-02 SURGERY — OPEN REDUCTION INTERNAL FIXATION (ORIF) TIBIA FRACTURE
Anesthesia: General | Site: Leg Lower | Laterality: Right | Wound class: Clean

## 2013-05-02 MED ORDER — TRAMADOL HCL 50 MG PO TABS
100.0000 mg | ORAL_TABLET | Freq: Four times a day (QID) | ORAL | Status: DC | PRN
  Administered 2013-05-02: 100 mg via ORAL
  Filled 2013-05-02: qty 2

## 2013-05-02 MED ORDER — BOOST / RESOURCE BREEZE PO LIQD
1.0000 | Freq: Every day | ORAL | Status: DC
  Administered 2013-05-02 – 2013-05-04 (×3): 1 via ORAL

## 2013-05-02 MED ORDER — ONDANSETRON HCL 4 MG/2ML IJ SOLN
INTRAMUSCULAR | Status: DC | PRN
  Administered 2013-05-02: 4 mg via INTRAVENOUS

## 2013-05-02 MED ORDER — GLYCOPYRROLATE 0.2 MG/ML IJ SOLN
INTRAMUSCULAR | Status: DC | PRN
  Administered 2013-05-02: .8 mg via INTRAVENOUS

## 2013-05-02 MED ORDER — GABAPENTIN 300 MG PO CAPS
300.0000 mg | ORAL_CAPSULE | Freq: Every day | ORAL | Status: DC
  Administered 2013-05-02 – 2013-05-03 (×2): 300 mg via ORAL
  Filled 2013-05-02 (×3): qty 1

## 2013-05-02 MED ORDER — ONDANSETRON HCL 4 MG/2ML IJ SOLN
4.0000 mg | Freq: Four times a day (QID) | INTRAMUSCULAR | Status: DC | PRN
  Filled 2013-05-02: qty 2

## 2013-05-02 MED ORDER — LACTATED RINGERS IV SOLN
INTRAVENOUS | Status: DC | PRN
  Administered 2013-05-02 (×2): via INTRAVENOUS

## 2013-05-02 MED ORDER — SERTRALINE HCL 25 MG PO TABS
25.0000 mg | ORAL_TABLET | Freq: Every day | ORAL | Status: DC
  Administered 2013-05-03 – 2013-05-04 (×2): 25 mg via ORAL
  Filled 2013-05-02 (×2): qty 1

## 2013-05-02 MED ORDER — DOCUSATE SODIUM 100 MG PO CAPS
100.0000 mg | ORAL_CAPSULE | Freq: Two times a day (BID) | ORAL | Status: DC
  Administered 2013-05-02 – 2013-05-04 (×4): 100 mg via ORAL
  Filled 2013-05-02 (×5): qty 1

## 2013-05-02 MED ORDER — ARTIFICIAL TEARS OP OINT
TOPICAL_OINTMENT | OPHTHALMIC | Status: DC | PRN
  Administered 2013-05-02: 1 via OPHTHALMIC

## 2013-05-02 MED ORDER — 0.9 % SODIUM CHLORIDE (POUR BTL) OPTIME
TOPICAL | Status: DC | PRN
  Administered 2013-05-02: 1000 mL

## 2013-05-02 MED ORDER — LIDOCAINE HCL (CARDIAC) 20 MG/ML IV SOLN
INTRAVENOUS | Status: DC | PRN
  Administered 2013-05-02: 50 mg via INTRAVENOUS

## 2013-05-02 MED ORDER — METOCLOPRAMIDE HCL 10 MG PO TABS: 5.0000 mg | ORAL_TABLET | Freq: Three times a day (TID) | ORAL | Status: DC | PRN

## 2013-05-02 MED ORDER — PROPOFOL 10 MG/ML IV BOLUS
INTRAVENOUS | Status: DC | PRN
  Administered 2013-05-02: 100 mg via INTRAVENOUS

## 2013-05-02 MED ORDER — MORPHINE SULFATE 2 MG/ML IJ SOLN
1.0000 mg | INTRAMUSCULAR | Status: DC | PRN
  Administered 2013-05-02: 1 mg via INTRAVENOUS
  Filled 2013-05-02: qty 1

## 2013-05-02 MED ORDER — PHENYLEPHRINE HCL 10 MG/ML IJ SOLN
INTRAMUSCULAR | Status: DC | PRN
  Administered 2013-05-02: 40 ug via INTRAVENOUS
  Administered 2013-05-02: 80 ug via INTRAVENOUS
  Administered 2013-05-02: 40 ug via INTRAVENOUS
  Administered 2013-05-02: 80 ug via INTRAVENOUS
  Administered 2013-05-02: 40 ug via INTRAVENOUS

## 2013-05-02 MED ORDER — HYDROMORPHONE HCL PF 1 MG/ML IJ SOLN
INTRAMUSCULAR | Status: AC
  Administered 2013-05-02: 0.5 mg via INTRAVENOUS
  Filled 2013-05-02: qty 1

## 2013-05-02 MED ORDER — POTASSIUM CHLORIDE IN NACL 20-0.9 MEQ/L-% IV SOLN
INTRAVENOUS | Status: DC
  Administered 2013-05-02 – 2013-05-03 (×2): via INTRAVENOUS
  Filled 2013-05-02 (×3): qty 1000

## 2013-05-02 MED ORDER — ACETAMINOPHEN 10 MG/ML IV SOLN: 1000.0000 mg | Freq: Once | INTRAVENOUS | Status: DC | PRN

## 2013-05-02 MED ORDER — LORATADINE 10 MG PO TABS
10.0000 mg | ORAL_TABLET | Freq: Every day | ORAL | Status: DC
  Administered 2013-05-02 – 2013-05-04 (×3): 10 mg via ORAL
  Filled 2013-05-02 (×3): qty 1

## 2013-05-02 MED ORDER — NEOSTIGMINE METHYLSULFATE 1 MG/ML IJ SOLN
INTRAMUSCULAR | Status: DC | PRN
  Administered 2013-05-02: 4 mg via INTRAVENOUS

## 2013-05-02 MED ORDER — ASPIRIN EC 325 MG PO TBEC: 325.0000 mg | DELAYED_RELEASE_TABLET | Freq: Every day | ORAL | Status: DC

## 2013-05-02 MED ORDER — VITAMIN C 500 MG PO TABS
500.0000 mg | ORAL_TABLET | Freq: Two times a day (BID) | ORAL | Status: DC
  Administered 2013-05-02 – 2013-05-04 (×4): 500 mg via ORAL
  Filled 2013-05-02 (×5): qty 1

## 2013-05-02 MED ORDER — THROMBIN 20000 UNITS EX SOLR
CUTANEOUS | Status: AC
  Filled 2013-05-02: qty 20000

## 2013-05-02 MED ORDER — DOCUSATE SODIUM 100 MG PO CAPS: 100.0000 mg | ORAL_CAPSULE | Freq: Two times a day (BID) | ORAL | Status: DC

## 2013-05-02 MED ORDER — SODIUM CHLORIDE 0.9 % IV SOLN
20.0000 mg | INTRAVENOUS | Status: DC | PRN
  Administered 2013-05-02: 25 ug/min via INTRAVENOUS

## 2013-05-02 MED ORDER — LORAZEPAM 0.5 MG PO TABS
0.5000 mg | ORAL_TABLET | Freq: Three times a day (TID) | ORAL | Status: DC | PRN
  Administered 2013-05-03: 0.5 mg via ORAL
  Filled 2013-05-02 (×2): qty 1

## 2013-05-02 MED ORDER — SIMVASTATIN 40 MG PO TABS
40.0000 mg | ORAL_TABLET | Freq: Every day | ORAL | Status: DC
  Administered 2013-05-02: 40 mg via ORAL
  Filled 2013-05-02: qty 1

## 2013-05-02 MED ORDER — METOPROLOL SUCCINATE ER 25 MG PO TB24
25.0000 mg | ORAL_TABLET | Freq: Every day | ORAL | Status: DC
  Administered 2013-05-03 – 2013-05-04 (×2): 25 mg via ORAL
  Filled 2013-05-02 (×2): qty 1

## 2013-05-02 MED ORDER — PRO-STAT SUGAR FREE PO LIQD
30.0000 mL | Freq: Two times a day (BID) | ORAL | Status: DC
  Administered 2013-05-02 – 2013-05-04 (×3): 30 mL via ORAL
  Filled 2013-05-02 (×5): qty 30

## 2013-05-02 MED ORDER — HYDROCODONE-ACETAMINOPHEN 7.5-325 MG PO TABS
1.0000 | ORAL_TABLET | ORAL | Status: DC | PRN
  Administered 2013-05-02: 2 via ORAL
  Administered 2013-05-02: 1 via ORAL
  Administered 2013-05-03 (×3): 2 via ORAL
  Administered 2013-05-04 (×2): 1 via ORAL
  Filled 2013-05-02: qty 1
  Filled 2013-05-02 (×2): qty 2
  Filled 2013-05-02: qty 1
  Filled 2013-05-02: qty 2
  Filled 2013-05-02: qty 1
  Filled 2013-05-02: qty 2

## 2013-05-02 MED ORDER — HYDROMORPHONE HCL PF 1 MG/ML IJ SOLN: 0.2500 mg | INTRAMUSCULAR | Status: DC | PRN

## 2013-05-02 MED ORDER — ONDANSETRON HCL 4 MG PO TABS: 4.0000 mg | ORAL_TABLET | Freq: Four times a day (QID) | ORAL | Status: DC | PRN

## 2013-05-02 MED ORDER — ATORVASTATIN CALCIUM 20 MG PO TABS
20.0000 mg | ORAL_TABLET | Freq: Every day | ORAL | Status: DC
  Administered 2013-05-03: 20 mg via ORAL
  Filled 2013-05-02 (×2): qty 1

## 2013-05-02 MED ORDER — HYDROCODONE-ACETAMINOPHEN 7.5-325 MG PO TABS: 1.0000 | ORAL_TABLET | Freq: Four times a day (QID) | ORAL | Status: DC | PRN

## 2013-05-02 MED ORDER — METOCLOPRAMIDE HCL 5 MG/ML IJ SOLN: 5.0000 mg | Freq: Three times a day (TID) | INTRAMUSCULAR | Status: DC | PRN

## 2013-05-02 MED ORDER — ENOXAPARIN SODIUM 40 MG/0.4ML ~~LOC~~ SOLN
40.0000 mg | SUBCUTANEOUS | Status: DC
  Administered 2013-05-02 – 2013-05-03 (×2): 40 mg via SUBCUTANEOUS
  Filled 2013-05-02 (×3): qty 0.4

## 2013-05-02 MED ORDER — PANTOPRAZOLE SODIUM 40 MG PO TBEC
40.0000 mg | DELAYED_RELEASE_TABLET | Freq: Every day | ORAL | Status: DC
  Administered 2013-05-03 – 2013-05-04 (×2): 40 mg via ORAL
  Filled 2013-05-02 (×2): qty 1

## 2013-05-02 MED ORDER — ROCURONIUM BROMIDE 100 MG/10ML IV SOLN
INTRAVENOUS | Status: DC | PRN
  Administered 2013-05-02: 40 mg via INTRAVENOUS
  Administered 2013-05-02: 10 mg via INTRAVENOUS

## 2013-05-02 MED ORDER — AMIODARONE HCL 100 MG PO TABS
100.0000 mg | ORAL_TABLET | Freq: Every day | ORAL | Status: DC
  Administered 2013-05-03 – 2013-05-04 (×2): 100 mg via ORAL
  Filled 2013-05-02 (×2): qty 1

## 2013-05-02 MED ORDER — ALBUMIN HUMAN 5 % IV SOLN
INTRAVENOUS | Status: DC | PRN
  Administered 2013-05-02 (×2): via INTRAVENOUS

## 2013-05-02 MED ORDER — LACTATED RINGERS IV SOLN: INTRAVENOUS | Status: DC

## 2013-05-02 MED ORDER — FENTANYL CITRATE 0.05 MG/ML IJ SOLN
INTRAMUSCULAR | Status: DC | PRN
  Administered 2013-05-02: 50 ug via INTRAVENOUS
  Administered 2013-05-02: 150 ug via INTRAVENOUS

## 2013-05-02 MED ORDER — ASPIRIN 81 MG PO CHEW
81.0000 mg | CHEWABLE_TABLET | Freq: Every day | ORAL | Status: DC
  Administered 2013-05-03 – 2013-05-04 (×2): 81 mg via ORAL
  Filled 2013-05-02 (×3): qty 1

## 2013-05-02 MED ORDER — LACTATED RINGERS IV SOLN
INTRAVENOUS | Status: DC
  Administered 2013-05-02: 09:00:00 via INTRAVENOUS

## 2013-05-02 MED ORDER — ADULT MULTIVITAMIN W/MINERALS CH
1.0000 | ORAL_TABLET | Freq: Every day | ORAL | Status: DC
  Administered 2013-05-03 – 2013-05-04 (×2): 1 via ORAL
  Filled 2013-05-02 (×2): qty 1

## 2013-05-02 MED ORDER — MUPIROCIN 2 % EX OINT
TOPICAL_OINTMENT | Freq: Two times a day (BID) | CUTANEOUS | Status: DC
  Administered 2013-05-02: 1 via NASAL
  Administered 2013-05-02 – 2013-05-04 (×4): via NASAL
  Filled 2013-05-02 (×2): qty 22

## 2013-05-02 MED ORDER — VANCOMYCIN HCL IN DEXTROSE 1-5 GM/200ML-% IV SOLN
1000.0000 mg | Freq: Two times a day (BID) | INTRAVENOUS | Status: AC
  Administered 2013-05-02: 1000 mg via INTRAVENOUS
  Filled 2013-05-02: qty 200

## 2013-05-02 MED ORDER — CHLORHEXIDINE GLUCONATE 4 % EX LIQD: 60.0000 mL | Freq: Once | CUTANEOUS | Status: DC

## 2013-05-02 MED ORDER — ONDANSETRON HCL 4 MG/2ML IJ SOLN: 4.0000 mg | Freq: Once | INTRAMUSCULAR | Status: DC | PRN

## 2013-05-02 MED ORDER — CALCIUM CITRATE 950 (200 CA) MG PO TABS
2.0000 | ORAL_TABLET | Freq: Every day | ORAL | Status: DC
  Administered 2013-05-03 – 2013-05-04 (×2): 400 mg via ORAL
  Filled 2013-05-02 (×3): qty 2

## 2013-05-02 MED ORDER — THROMBIN 20000 UNITS EX SOLR
OROMUCOSAL | Status: DC | PRN
  Administered 2013-05-02: 11:00:00 via TOPICAL

## 2013-05-02 MED ORDER — VITAMIN D3 25 MCG (1000 UNIT) PO TABS
1000.0000 [IU] | ORAL_TABLET | Freq: Every day | ORAL | Status: DC
  Administered 2013-05-02 – 2013-05-04 (×3): 1000 [IU] via ORAL
  Filled 2013-05-02 (×3): qty 1

## 2013-05-02 SURGICAL SUPPLY — 94 items
BANDAGE ELASTIC 4 VELCRO ST LF (GAUZE/BANDAGES/DRESSINGS) ×2 IMPLANT
BANDAGE ELASTIC 6 VELCRO ST LF (GAUZE/BANDAGES/DRESSINGS) ×2 IMPLANT
BANDAGE ESMARK 6X9 LF (GAUZE/BANDAGES/DRESSINGS) ×1 IMPLANT
BANDAGE GAUZE ELAST BULKY 4 IN (GAUZE/BANDAGES/DRESSINGS) ×2 IMPLANT
BIT DRILL 2.5X110 QC LCP DISP (BIT) ×2 IMPLANT
BIT DRILL 2.8 (BIT) ×1
BIT DRILL CANN QC 2.8X165 (BIT) ×1 IMPLANT
BLADE SURG 10 STRL SS (BLADE) ×2 IMPLANT
BLADE SURG 15 STRL LF DISP TIS (BLADE) ×1 IMPLANT
BLADE SURG 15 STRL SS (BLADE) ×1
BLADE SURG ROTATE 9660 (MISCELLANEOUS) IMPLANT
BNDG COHESIVE 4X5 TAN STRL (GAUZE/BANDAGES/DRESSINGS) ×2 IMPLANT
BNDG ESMARK 6X9 LF (GAUZE/BANDAGES/DRESSINGS) ×2
BOLT EXTRACTION 3.0/4.0 (BOLT) ×2 IMPLANT
BONE CANC CHIPS 40CC CAN1/2 (Bone Implant) ×2 IMPLANT
BRUSH SCRUB DISP (MISCELLANEOUS) ×4 IMPLANT
CHIPS CANC BONE 40CC CAN1/2 (Bone Implant) ×1 IMPLANT
CLOTH BEACON ORANGE TIMEOUT ST (SAFETY) ×2 IMPLANT
COVER MAYO STAND STRL (DRAPES) ×2 IMPLANT
DRAPE C-ARM 42X72 X-RAY (DRAPES) ×2 IMPLANT
DRAPE C-ARMOR (DRAPES) ×2 IMPLANT
DRAPE INCISE IOBAN 66X45 STRL (DRAPES) ×2 IMPLANT
DRAPE ORTHO SPLIT 77X108 STRL (DRAPES)
DRAPE SURG ORHT 6 SPLT 77X108 (DRAPES) IMPLANT
DRAPE U-SHAPE 47X51 STRL (DRAPES) ×2 IMPLANT
DRILL BIT 2.8MM (BIT) ×1
DRSG ADAPTIC 3X8 NADH LF (GAUZE/BANDAGES/DRESSINGS) ×2 IMPLANT
DRSG PAD ABDOMINAL 8X10 ST (GAUZE/BANDAGES/DRESSINGS) ×2 IMPLANT
ELECT REM PT RETURN 9FT ADLT (ELECTROSURGICAL) ×2
ELECTRODE REM PT RTRN 9FT ADLT (ELECTROSURGICAL) ×1 IMPLANT
EVACUATOR 1/8 PVC DRAIN (DRAIN) IMPLANT
EVACUATOR 3/16  PVC DRAIN (DRAIN)
EVACUATOR 3/16 PVC DRAIN (DRAIN) IMPLANT
GLOVE BIO SURGEON STRL SZ7.5 (GLOVE) ×2 IMPLANT
GLOVE BIO SURGEON STRL SZ8 (GLOVE) ×2 IMPLANT
GLOVE BIOGEL PI IND STRL 7.5 (GLOVE) ×1 IMPLANT
GLOVE BIOGEL PI IND STRL 8 (GLOVE) ×1 IMPLANT
GLOVE BIOGEL PI INDICATOR 7.5 (GLOVE) ×1
GLOVE BIOGEL PI INDICATOR 8 (GLOVE) ×1
GLOVE SURG SS PI 7.0 STRL IVOR (GLOVE) ×2 IMPLANT
GOWN PREVENTION PLUS XLARGE (GOWN DISPOSABLE) ×2 IMPLANT
GOWN STRL NON-REIN LRG LVL3 (GOWN DISPOSABLE) ×4 IMPLANT
IMMOBILIZER KNEE 22 UNIV (SOFTGOODS) ×2 IMPLANT
KIT BASIN OR (CUSTOM PROCEDURE TRAY) ×2 IMPLANT
KIT INFUSE LRG II (Orthopedic Implant) ×2 IMPLANT
KIT ROOM TURNOVER OR (KITS) ×2 IMPLANT
MANIFOLD NEPTUNE II (INSTRUMENTS) ×2 IMPLANT
NDL SUT .5 MAYO 1.404X.05X (NEEDLE) IMPLANT
NEEDLE 22X1 1/2 (OR ONLY) (NEEDLE) IMPLANT
NEEDLE MAYO TAPER (NEEDLE)
NS IRRIG 1000ML POUR BTL (IV SOLUTION) ×2 IMPLANT
PACK ORTHO EXTREMITY (CUSTOM PROCEDURE TRAY) ×2 IMPLANT
PAD ARMBOARD 7.5X6 YLW CONV (MISCELLANEOUS) ×4 IMPLANT
PAD CAST 4YDX4 CTTN HI CHSV (CAST SUPPLIES) ×1 IMPLANT
PADDING CAST ABS 4INX4YD NS (CAST SUPPLIES) ×1
PADDING CAST ABS 6INX4YD NS (CAST SUPPLIES) ×1
PADDING CAST ABS COTTON 4X4 ST (CAST SUPPLIES) ×1 IMPLANT
PADDING CAST ABS COTTON 6X4 NS (CAST SUPPLIES) ×1 IMPLANT
PADDING CAST COTTON 4X4 STRL (CAST SUPPLIES) ×1
PADDING CAST COTTON 6X4 STRL (CAST SUPPLIES) ×2 IMPLANT
PLATE POST DISTAL 3.5M 12H (Plate) ×2 IMPLANT
SCREW CANC FT 4.0X45 (Screw) ×2 IMPLANT
SCREW CORTEX 3.5 26MM (Screw) ×1 IMPLANT
SCREW CORTEX 3.5 28MM (Screw) ×1 IMPLANT
SCREW LOCK CORT ST 3.5X26 (Screw) ×1 IMPLANT
SCREW LOCK CORT ST 3.5X28 (Screw) ×1 IMPLANT
SCREW LOCK SELF-TAP 3.5X46 (Screw) ×2 IMPLANT
SCREW LOCKING 3.5X26 (Screw) ×4 IMPLANT
SCREW LOCKING 3.5X45 (Screw) ×2 IMPLANT
SCREW LOCKING 3.5X48MM (Screw) ×4 IMPLANT
SCREW LOCKING 3.5X50 (Screw) ×2 IMPLANT
SPONGE GAUZE 4X4 12PLY (GAUZE/BANDAGES/DRESSINGS) ×2 IMPLANT
SPONGE LAP 18X18 X RAY DECT (DISPOSABLE) ×2 IMPLANT
STAPLER VISISTAT 35W (STAPLE) ×2 IMPLANT
STOCKINETTE IMPERVIOUS LG (DRAPES) ×2 IMPLANT
SUCTION FRAZIER TIP 10 FR DISP (SUCTIONS) ×2 IMPLANT
SUT ETHILON 3 0 PS 1 (SUTURE) ×6 IMPLANT
SUT PDS AB 2-0 CT1 27 (SUTURE) ×2 IMPLANT
SUT PROLENE 0 CT 2 (SUTURE) ×4 IMPLANT
SUT PROLENE 3 0 SH 1 (SUTURE) ×4 IMPLANT
SUT VIC AB 0 CT1 27 (SUTURE) ×1
SUT VIC AB 0 CT1 27XBRD ANBCTR (SUTURE) ×1 IMPLANT
SUT VIC AB 1 CT1 27 (SUTURE) ×1
SUT VIC AB 1 CT1 27XBRD ANBCTR (SUTURE) ×1 IMPLANT
SUT VIC AB 2-0 CT1 27 (SUTURE) ×2
SUT VIC AB 2-0 CT1 TAPERPNT 27 (SUTURE) ×2 IMPLANT
SYR 20ML ECCENTRIC (SYRINGE) IMPLANT
TOWEL OR 17X24 6PK STRL BLUE (TOWEL DISPOSABLE) ×2 IMPLANT
TOWEL OR 17X26 10 PK STRL BLUE (TOWEL DISPOSABLE) ×4 IMPLANT
TRAY FOLEY CATH 14FR (SET/KITS/TRAYS/PACK) IMPLANT
TUBE CONNECTING 12X1/4 (SUCTIONS) ×2 IMPLANT
TUBE SPARE F/HOLLOW REAMER (TUBING) ×2 IMPLANT
WATER STERILE IRR 1000ML POUR (IV SOLUTION) ×4 IMPLANT
YANKAUER SUCT BULB TIP NO VENT (SUCTIONS) ×2 IMPLANT

## 2013-05-02 NOTE — Progress Notes (Signed)
Orthopedic Tech Progress Note Patient Details:  Jill Bowman March 11, 1929 [id number]  Ortho Devices Ortho Device/Splint Location: applied overhead frame Ortho Device/Splint Interventions: Ordered;Application   Jennye Moccasin 05/02/2013, 4:46 PM

## 2013-05-02 NOTE — Preoperative (Signed)
Beta Blockers   Reason not to administer Beta Blockers:Not Applicable 

## 2013-05-02 NOTE — Anesthesia Preprocedure Evaluation (Addendum)
Anesthesia Evaluation  Patient identified by MRN, date of birth, ID band Patient awake    Reviewed: Allergy & Precautions, H&P , NPO status , Patient's Chart, lab work & pertinent test results, reviewed documented beta blocker date and time   Airway Mallampati: II      Dental  (+) Teeth Intact, Partial Upper and Dental Advisory Given   Pulmonary shortness of breath, asthma , pneumonia -, resolved,  breath sounds clear to auscultation        Cardiovascular hypertension, + CAD + dysrhythmias + pacemaker Rhythm:Regular Rate:Normal     Neuro/Psych    GI/Hepatic hiatal hernia,   Endo/Other  diabetes, Well Controlled  Renal/GU      Musculoskeletal   Abdominal (+) + obese,   Peds  Hematology  (+) Blood dyscrasia, ,   Anesthesia Other Findings   Reproductive/Obstetrics                          Anesthesia Physical Anesthesia Plan  ASA: III  Anesthesia Plan: General   Post-op Pain Management:    Induction: Intravenous  Airway Management Planned: Oral ETT  Additional Equipment:   Intra-op Plan:   Post-operative Plan: Extubation in OR  Informed Consent: I have reviewed the patients History and Physical, chart, labs and discussed the procedure including the risks, benefits and alternatives for the proposed anesthesia with the patient or authorized representative who has indicated his/her understanding and acceptance.   Dental advisory given and Dental Advisory Given  Plan Discussed with: Anesthesiologist, CRNA and Surgeon  Anesthesia Plan Comments: (79 year old female S/P R. Pilon fracture with nonunion tibia H/O Brady-tachy syndrome with pacemake , a-paced on ecg htn CAD: LAD-DES-August 2005; normal Myoview 2011 No angina H/O asthma lungs clear Obesity)      Anesthesia Quick Evaluation
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
## 2013-05-02 NOTE — Brief Op Note (Addendum)
05/02/2013  12:38 PM  PATIENT:  Jill Bowman  79 y.o. female  PRE-OPERATIVE DIAGNOSIS:  RIGHT TIBIAL NON UNION, broken hardware  POST-OPERATIVE DIAGNOSIS:  Right tibial non-union, broken hardware  PROCEDURE:  Procedure(s): 1. TIBIA NON UNION REPAIR WITH large Infuse, cancellous GRAFT (Right), and plating 2. Removal of hardware, broken 3. Syndesmotic fusion  SURGEON:  Surgeon(s) and Role:    * Budd Palmer, MD - Primary  PHYSICIAN ASSISTANT: Montez Morita, [facility]  ANESTHESIA:   general  EBL:  Total I/O In: 1700 [I.V.:1200; IV Piggyback:500] Out: 400 [Urine:250; Blood:150]  BLOOD ADMINISTERED:none  DRAINS: none   LOCAL MEDICATIONS USED:  NONE  SPECIMEN:  TWO FROM NONUNION SITE  DISPOSITION OF SPECIMEN:  MICRO  COUNTS:  YES  TOURNIQUET:    DICTATION: .Other Dictation: Dictation Number [id number]  PLAN OF CARE: Admit to inpatient   PATIENT DISPOSITION:  PACU - hemodynamically stable.   Delay start of Pharmacological VTE agent (>24hrs) due to surgical blood loss or risk of bleeding: no

## 2013-05-02 NOTE — Anesthesia Procedure Notes (Signed)
Procedure Name: Intubation Date/Time: 05/02/2013 9:12 AM Performed by: Carmela Rima Pre-anesthesia Checklist: Patient identified, Timeout performed, Emergency Drugs available, Suction available and Patient being monitored Patient Re-evaluated:Patient Re-evaluated prior to inductionOxygen Delivery Method: Circle system utilized Preoxygenation: Pre-oxygenation with 100% oxygen Intubation Type: IV induction Ventilation: Mask ventilation without difficulty Laryngoscope Size: Mac and 3 Grade View: Grade I Tube type: Oral Tube size: 7.5 mm Number of attempts: 1 Placement Confirmation: ETT inserted through vocal cords under direct vision,  positive ETCO2 and breath sounds checked- equal and bilateral Secured at: 22 cm Tube secured with: Tape Dental Injury: Teeth and Oropharynx as per pre-operative assessment

## 2013-05-02 NOTE — Progress Notes (Signed)
Utilization review completed. Paytan Recine, RN, BSN. 

## 2013-05-02 NOTE — Transfer of Care (Signed)
Immediate Anesthesia Transfer of Care Note  Patient: Jill Bowman  Procedure(s) Performed: Procedure(s): TIBIA NON UNION REPAIR WITH GRAFT (Right)  Patient Location: PACU  Anesthesia Type:General  Level of Consciousness: awake, alert  and oriented  Airway & Oxygen Therapy: Patient Spontanous Breathing and Patient connected to nasal cannula oxygen  Post-op Assessment: Report given to PACU RN, Post -op Vital signs reviewed and stable and Patient moving all extremities X 4  Post vital signs: Reviewed and stable  Complications: No apparent anesthesia complications

## 2013-05-03 LAB — URINE CULTURE

## 2013-05-03 LAB — BASIC METABOLIC PANEL
BUN: 11 mg/dL (ref 6–23)
CO2: 24 mEq/L (ref 19–32)
Chloride: 103 mEq/L (ref 96–112)
Creatinine, Ser: 0.95 mg/dL (ref 0.50–1.10)

## 2013-05-03 NOTE — Op Note (Signed)
NAMEMarland Kitchen  SHA, BURLING NO.:  [id number]  MEDICAL RECORD NO.:  [id number]  LOCATION:  5N22C                        FACILITY:  [facility]  PHYSICIAN:  Jill Bowman, M.D. DATE OF BIRTH:  [birth year]  DATE OF PROCEDURE:  05/02/2013 DATE OF DISCHARGE:                              OPERATIVE REPORT   PREOPERATIVE DIAGNOSIS:  Right tibia nonunion with broken hardware.  POSTOPERATIVE DIAGNOSIS:  Right tibia nonunion with broken hardware.  PROCEDURE: 1. Repair of right tibial nonunion with infuse and allograft. 2. Removal of deep implant with fracture hardware. 3. Fusion/ arthrodesis of distal tibial-fibular joint  SURGEON:  Jill Albino. Carola Frost, MD  ASSISTANT:  Mearl Latin, PA-C  ANESTHESIA:  General.  COMPLICATIONS:  None.  SPECIMENS:  Two anaerobic, aerobic from the nonunion site to microbiology.  FINDINGS:  No bacteria.  DRAINS:  None.  DISPOSITION:  To PACU.  CONDITION:  Stable.  BRIEF SUMMARY AND INDICATION FOR PROCEDURE:  Jill Bowman is a 79- year-old female multi-trauma patient with severe bilateral lower extremity open fractures, who underwent serial debridements and procedures.  She ultimately developed a nonunion on the right and broken hardware.  She has experienced progressive pain and consequently discussed revision osteosynthesis.  She understood the risk to include persistent nonunion, infection, nerve injury, vessel injury, need for further surgery, DVT, PE, heart attack, stroke, loss of motion, arthritis, failure to prevent arthritis, and many others and did wish to proceed.  This WAS discussed with her daughters as well.  BRIEF SUMMARY OF PROCEDURE:  Jill Bowman was given preoperative antibiotics, taken out to operating room where general anesthesia was induced.  Her right lower extremity was prepped and draped in usual sterile fashion after placing her prone on the operative table, padding all prominences and placing the chest rolls  appropriately.  The old posterolateral incision was remade.  Dissection was carried down to the subcutaneous tissues and to the muscle which was retracted medially.  I was able in stepwise fashion to expose the plate and remove all of the intact screws and the plate.  The most distal of the kickstand screws was fractured as was the 2nd to most medial screw in the distal plafond section.  These required use of the easy out device and reverse threaded extractor.  The C-arm was brought in and the fracture line nonunion site identified.  It was incised with a 15 blade and then secured sequentially and treated with a curette to remove the fibrinous material and unincorporated graft.  This material was cultured and there was no evidence of infection or purulence of any kind.  After being irrigated thoroughly and completely cleaning out the cavity, it was packed with 30 mL of cancellous graft mixing in portions of the large infuse in a [place] type preparation.  This was followed by placement of a sponge over the entirety of the grafted area posteriorly.  Distally at the posterior aspect of the syndesmosis, I placed a significant amount of graft as well and debrided any scar tissues in an attempt to fuse the syndesmosis as well.  Did save a small portion of the infuse for this arthrodesis effort too.  I placed additional allograft chips along this  interval as well in an attempt to improve bone healing.  I did not wish to disturb the peroneal artery, however, and was therefore limited in the aggressiveness which I could graft this area.  I then placed the posterior tibial plate securing fixation distally and proximally controlling the alignment of the fracture.  Final images showed restoration of the preoperative alignment and hardware trajectory and length.  I did use the 4 distal locks only and did not use any kickstand screws and approximately 2 standard screws and 2 locked proximally.   Sterile gently compressive dressing was applied and then a posterior stirrup splint. Jill Bowman was awaken from anesthesia and transported to the PACU in stable condition.  Montez Morita, PA-C did assist me during this case. Assistant was necessary given the difficult exposure and the need for constant retraction to protect the neurovascular bundles and allow for grafting and instrumentation.  PROGNOSIS:  Jill Bowman is 79 years old.  She is at increased risk for slow healing and delayed recovery of function.  That being said, she has certainly exceeded expectation with regard to previous fractures in healing and recovery.  We plan for 6 weeks of nonweightbearing followed by advancement weightbearing as tolerated.  We will plan to see her back for removal of her sutures in 14 days.  Of note, suture closure did require vertical mattress to evert the edges and this did add to the operative time, but she tolerated it quite well.  She is also at increased risk for persistent nonunion, infection, arthritis given the previous magnitude of fracture and residual malalignment and heart attack stroke as well as others given her comorbidities.     Jill Bowman, M.D.     MHH/MEDQ  D:  05/02/2013  T:  05/03/2013  Job:  [id number]
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
## 2013-05-03 NOTE — Progress Notes (Signed)
     Subjective:  Patient reports pain as moderate.  She denies any dysuria. 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  Her daughter is wondering when she can go home, although they do have a set of steps, which she has not yet been able to manage, do to weightbearing restrictions  Objective:   VITALS:   Filed Vitals:   05/02/13 2200 05/03/13 0414 05/03/13 1241 05/03/13 1411  BP: 103/32 120/49 120/49 108/37  Pulse: 63 70 62 64  Temp: 98 F (36.7 C) 98.9 F (37.2 C) 98.4 F (36.9 C) 98 F (36.7 C)  TempSrc: Oral Oral Oral Oral  Resp: 16 16  18   Height:      Weight:      SpO2: 99% 98% 99% 97%    Neurologically intact Sensation intact distally Dorsiflexion/Plantar flexion intact Her splint is intact, and her leg is reasonably well elevated.  Lab Results  Component Value Date   WBC 8.9 05/02/2013   HGB 12.6 05/02/2013   HCT 37.8 05/02/2013   MCV 92.6 05/02/2013   PLT 251 05/02/2013   Her urine culture has not grown out anything, and her urinalysis had negative nitrites, although there was positive leukocyte esterase. The microscopic had some hyalin cast with few bacteria as well as squamous epithelial cells, concerning for contamination.  Assessment/Plan: 1 Day Post-Op   Active Problems:   Pacemaker   CAD (coronary artery disease)   Morbid obesity   Physical deconditioning   Fracture, nonunion Right tibia  I do not have any definitive diagnosis for urinary tract infection based on the data currently available. I would not recommend treating it in the absence of symptoms, at least for the time being. She is to continue nonweightbearing on the right lower extremity, and working with physical therapy, and possibly discharge home tomorrow in the care of her daughter if she is able to pass physical therapy and they have the appropriate home  equipment.    Norine Reddington P 05/03/2013, 3:06 PM   Teryl Lucy, MD Cell [phone]
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
## 2013-05-04 LAB — BASIC METABOLIC PANEL
Calcium: 8.2 mg/dL — ABNORMAL LOW (ref 8.4–10.5)
Chloride: 105 mEq/L (ref 96–112)
Creatinine, Ser: 0.97 mg/dL (ref 0.50–1.10)
GFR calc Af Amer: 62 mL/min — ABNORMAL LOW (ref >90)
GFR calc non Af Amer: 53 mL/min — ABNORMAL LOW (ref >90)

## 2013-05-04 NOTE — Progress Notes (Signed)
Patient was DC'd in stable condition via wheelchair. Verbalized understanding of D/C instructions - family present. States has RW, BSC at home.  HHPT/OT orered - [facility].

## 2013-05-04 NOTE — Progress Notes (Signed)
Patient ID: Jill Bowman, female   DOB: 1929/08/01, 79 y.o.   MRN: [id number]     Subjective:  Patient reports pain as mild to moderate.  She states that she is better today and would like to go home  Objective:   VITALS:   Filed Vitals:   05/03/13 0414 05/03/13 1241 05/03/13 1411 05/03/13 2208  BP: 120/49 120/49 108/37 98/39  Pulse: 70 62 64 58  Temp: 98.9 F (37.2 C) 98.4 F (36.9 C) 98 F (36.7 C) 98.3 F (36.8 C)  TempSrc: Oral Oral Oral Oral  Resp: 16  18 14   Height:      Weight:      SpO2: 98% 99% 97% 97%    ABD soft Sensation intact distally Dorsiflexion/Plantar flexion intact Incision: dressing C/D/I and no drainage   Lab Results  Component Value Date   WBC 8.9 05/02/2013   HGB 12.6 05/02/2013   HCT 37.8 05/02/2013   MCV 92.6 05/02/2013   PLT 251 05/02/2013     Assessment/Plan: 2 Days Post-Op   Active Problems:   Pacemaker   CAD (coronary artery disease)   Morbid obesity   Physical deconditioning   Fracture, nonunion Right tibia   Advance diet Up with therapy Discharge home with home health Per Dr Ardeth Perfect orders NWB right lower ext    Haskel Khan 05/04/2013, 9:43 AM   Teryl Lucy, MD Cell [phone]

## 2013-05-04 NOTE — Progress Notes (Signed)
   CARE MANAGEMENT NOTE 05/04/2013  Patient:  ERIAN, LARIVIERE   Account Number:  [id number]  Date Initiated:  05/04/2013  Documentation initiated by:  [facility]  Subjective/Objective Assessment:     Action/Plan:   Anticipated DC Date:  05/04/2013   Anticipated DC Plan:  HOME W HOME HEALTH SERVICES      DC Planning Services  CM consult      [facility] Choice  Resumption Of Svcs/PTA Provider  HOME HEALTH   Choice offered to / List presented to:  C-1 Patient        HH arranged  HH-2 PT  HH-1 RN      [facility] agency  [facility]   Status of service:  Completed, signed off Medicare Important Message given?   (If response is "NO", the following Medicare IM given date fields will be blank) Date Medicare IM given:   Date Additional Medicare IM given:    Discharge Disposition:  HOME W HOME HEALTH SERVICES  Per UR Regulation:    If discussed at Long Length of Stay Meetings, dates discussed:    Comments:  05/04/2013 1300 NCM spoke to pt and states she is with [facility]. Faxed orders, F2F, facesheet, and h&p to [facility]. Message sent to [facility] rep for resumption of care with labs needed. [facility] contact info added to dc instructions. Isidoro Donning RN CCM Case Mgmt phone [phone]

## 2013-05-05 LAB — TYPE AND SCREEN
ABO/RH(D): O POS
Unit division: 0

## 2013-05-05 LAB — WOUND CULTURE: Culture: NO GROWTH

## 2013-05-05 NOTE — Progress Notes (Signed)
   CARE MANAGEMENT NOTE 05/05/2013  Patient:  Jill Bowman, Jill Bowman   Account Number:  [id number]  Date Initiated:  05/04/2013  Documentation initiated by:  [facility]  Subjective/Objective Assessment:     Action/Plan:   Anticipated DC Date:  05/04/2013   Anticipated DC Plan:  HOME W HOME HEALTH SERVICES      DC Planning Services  CM consult      [facility] Choice  HOME HEALTH   Choice offered to / List presented to:  C-1 Patient        HH arranged  HH-2 PT  HH-1 RN      [facility] agency  [facility]   Status of service:  Completed, signed off Medicare Important Message given?   (If response is "NO", the following Medicare IM given date fields will be blank) Date Medicare IM given:   Date Additional Medicare IM given:    Discharge Disposition:  HOME W HOME HEALTH SERVICES  Per UR Regulation:    If discussed at Long Length of Stay Meetings, dates discussed:    Comments:  05/05/2013 1100 NCM contacted [facility] office and they did receive referral. Will see pt within 24-48 hours post dc. Isidoro Donning RN CCM Case Mgmt phone [phone]  05/04/2013 1300 NCM spoke to pt and states she is with [facility]. Faxed orders, F2F, facesheet, and h&p to [facility]. Message sent to [facility] rep for resumption of care with labs needed. [facility] contact info added to dc instructions. Isidoro Donning RN CCM Case Mgmt phone [phone]

## 2013-05-07 LAB — ANAEROBIC CULTURE

## 2013-05-08 ENCOUNTER — Encounter (HOSPITAL_COMMUNITY): Payer: Self-pay | Admitting: Orthopedic Surgery

## 2013-05-08 ENCOUNTER — Other Ambulatory Visit: Payer: Self-pay | Admitting: Oncology

## 2013-05-08 DIAGNOSIS — C50911 Malignant neoplasm of unspecified site of right female breast: Secondary | ICD-10-CM

## 2013-05-09 ENCOUNTER — Telehealth: Payer: Self-pay | Admitting: *Deleted

## 2013-05-09 NOTE — Telephone Encounter (Signed)
sw pt daughter gv appt d/t for 06/09/13@ 10:45am for labs and ov@ 11:15am. i also made her aware that i will mail a letter/cal as well...td

## 2013-05-14 NOTE — Anesthesia Postprocedure Evaluation (Signed)
  Anesthesia Post-op Note  Patient: Jill Bowman  Procedure(s) Performed: Procedure(s): TIBIA NON UNION REPAIR WITH GRAFT (Right)  Patient Location: PACU  Anesthesia Type:General  Level of Consciousness: awake  Airway and Oxygen Therapy: Patient Spontanous Breathing  Post-op Pain: mild  Post-op Assessment: Post-op Vital signs reviewed  Post-op Vital Signs: stable  Complications: No apparent anesthesia complications
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
## 2013-06-03 ENCOUNTER — Telehealth: Payer: Self-pay | Admitting: Oncology

## 2013-06-09 ENCOUNTER — Other Ambulatory Visit: Payer: Medicare Other | Admitting: Lab

## 2013-06-09 ENCOUNTER — Ambulatory Visit: Payer: Medicare Other | Admitting: Oncology

## 2013-08-01 IMAGING — CR DG CHEST 1V PORT
1 series · 1 of 1 positions shown · non-contrast
Comparison: Acute abdomen series from 08/16/2012

CLINICAL DATA: Preoperative respiratory evaluation.

PORTABLE CHEST - 1 VIEW

[AP]
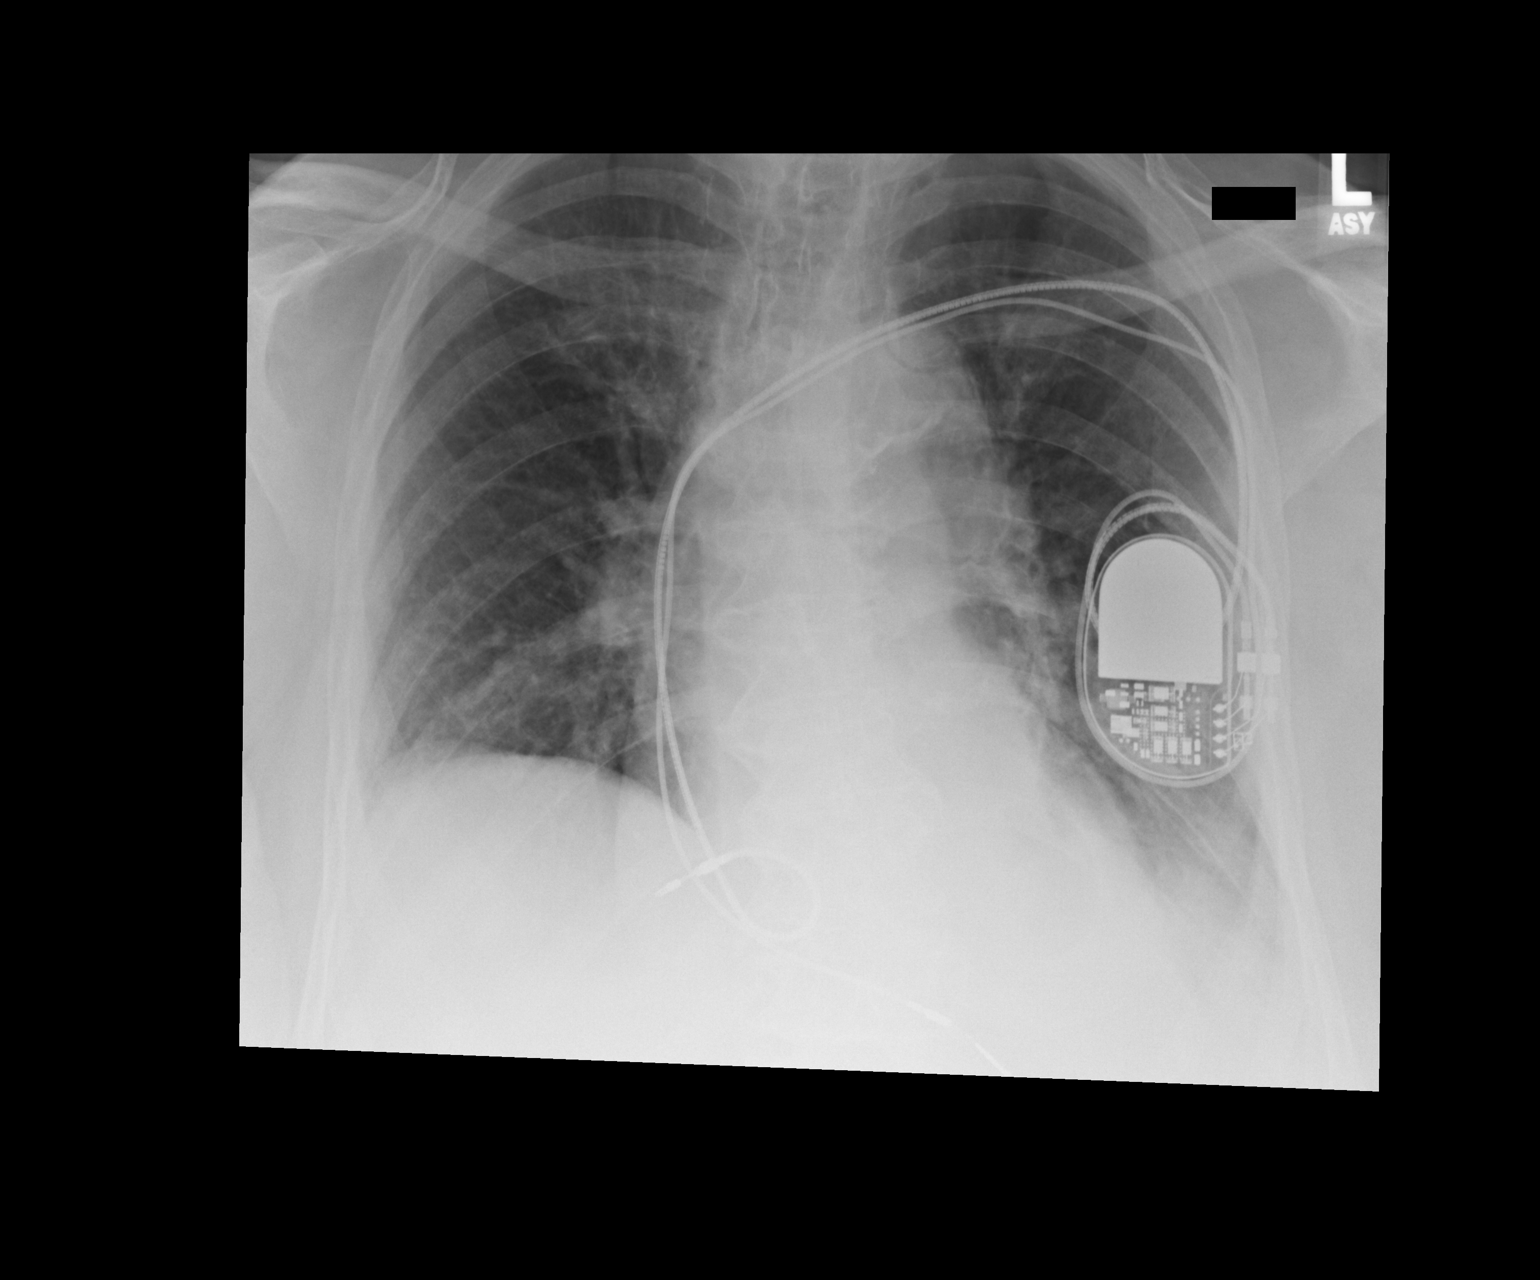

[1 of 1 positions shown; findings below may reference images not displayed]

FINDINGS: 3523 hours.  Low lung volumes with bibasilar atelectasis
or infiltrate and probable small bilateral pleural effusions.
Cardiopericardial silhouette is at upper limits of normal for size.
Left permanent pacemaker remains in place.
IMPRESSION: Low volume film with bibasilar atelectasis or infiltrate, left
greater than right.

Tiny bilateral pleural effusions.

## 2013-08-07 ENCOUNTER — Other Ambulatory Visit: Payer: Self-pay | Admitting: Orthopedic Surgery

## 2013-08-07 DIAGNOSIS — M25571 Pain in right ankle and joints of right foot: Secondary | ICD-10-CM

## 2013-08-11 ENCOUNTER — Other Ambulatory Visit: Payer: No Typology Code available for payment source

## 2013-08-11 IMAGING — RF DG TIBIA/FIBULA 2V*R*
1 series · 9 of 9 positions shown · non-contrast
Comparison: 09/23/2012 CT.

CLINICAL DATA: Right tibial fracture reduction.

DG C-ARM 61-120 MIN,RIGHT TIBIA AND FIBULA - 2 VIEW
TECHNIQUE: Nine intraoperative C-arm views submitted for review
after surgery.

[Series 1: run · 9 of 9 slices shown]
[im 1/9]
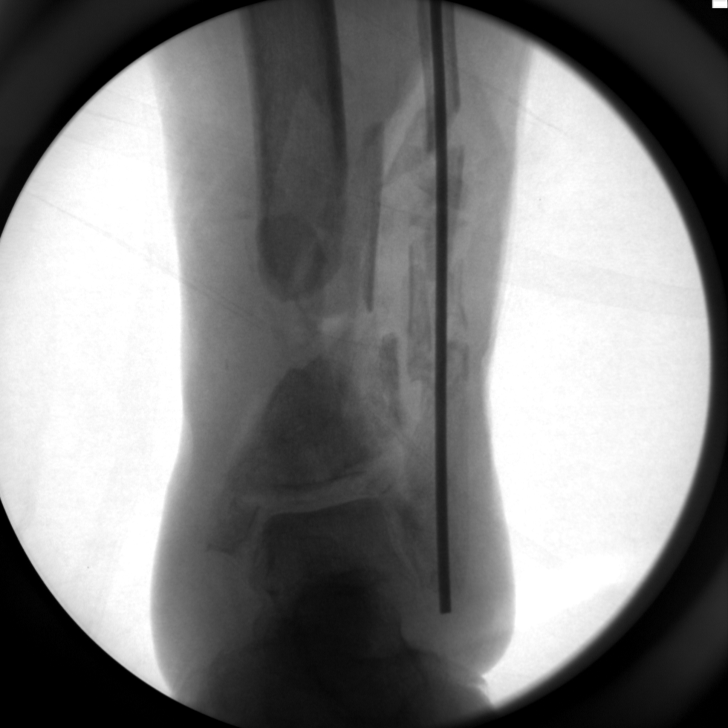
[im 2/9]
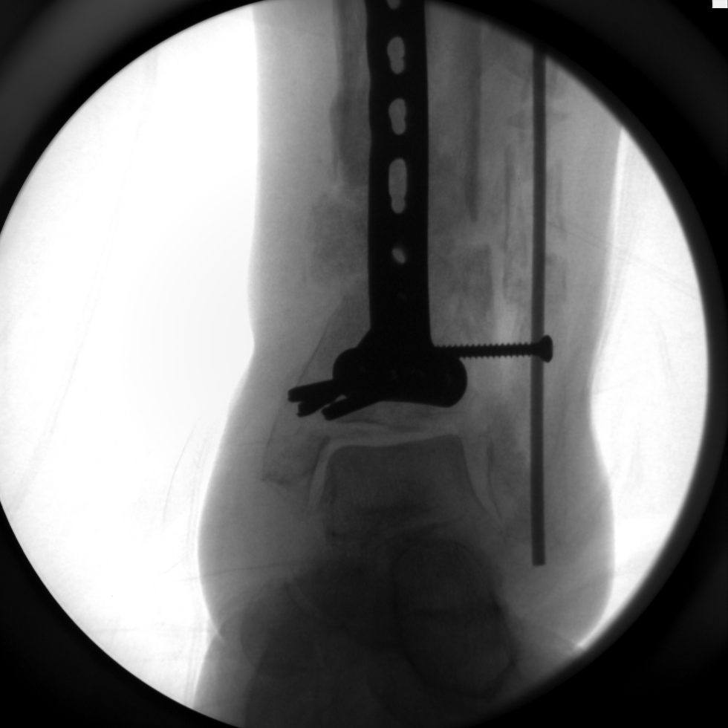
[im 3/9]
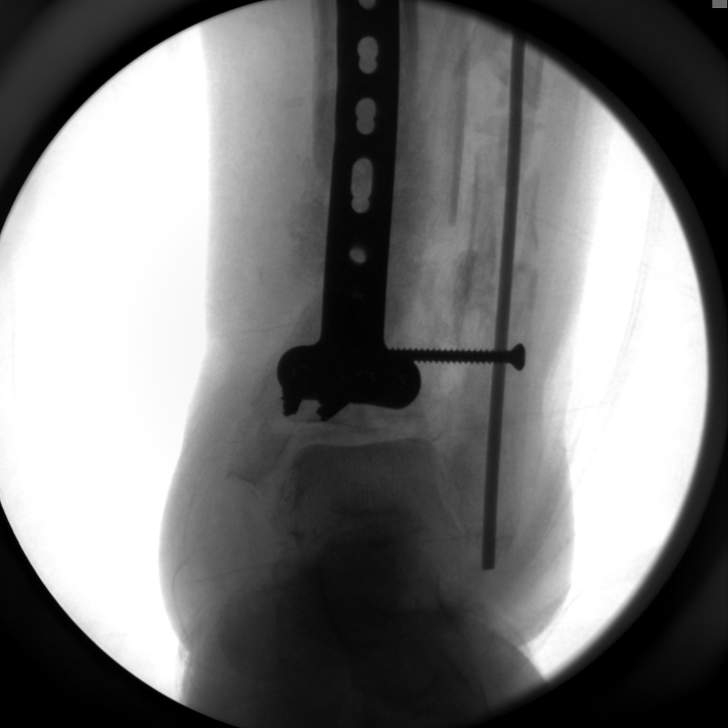
[im 4/9]
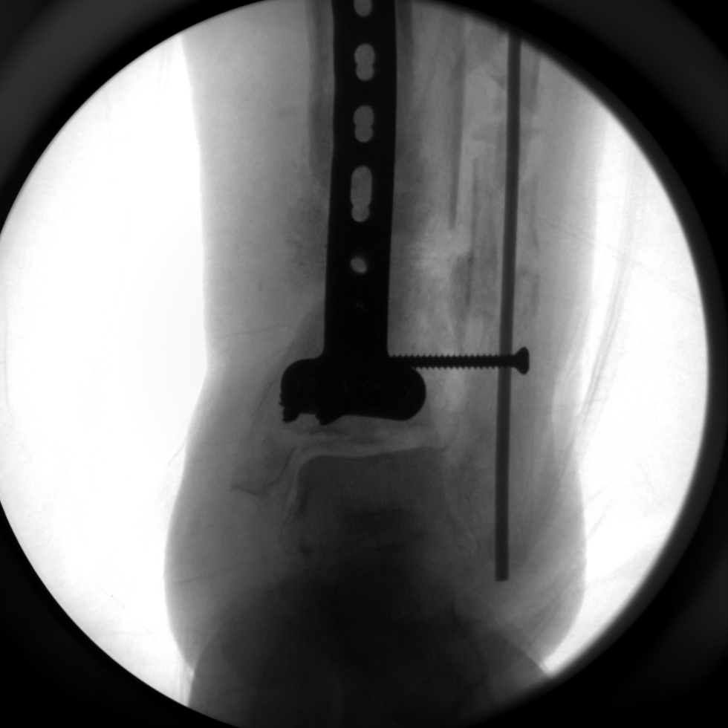
[im 5/9]
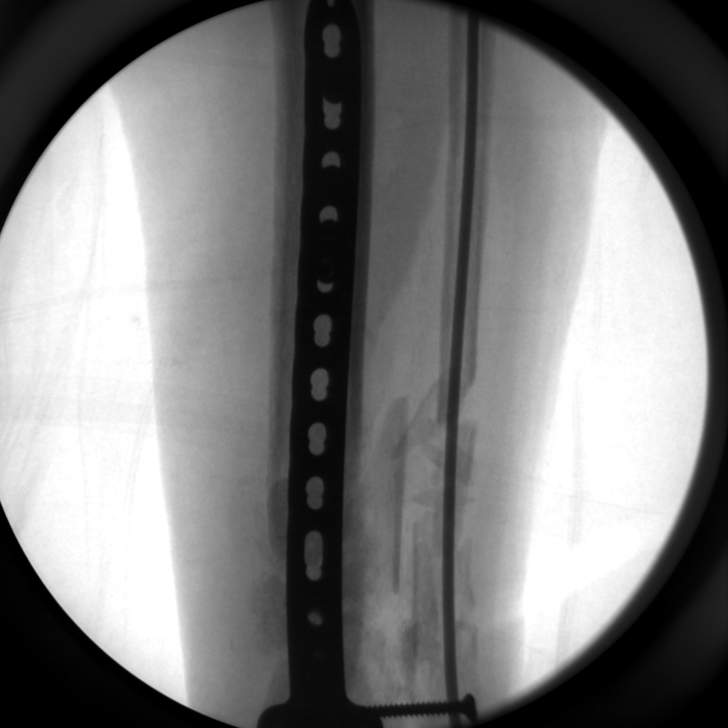
[im 6/9]
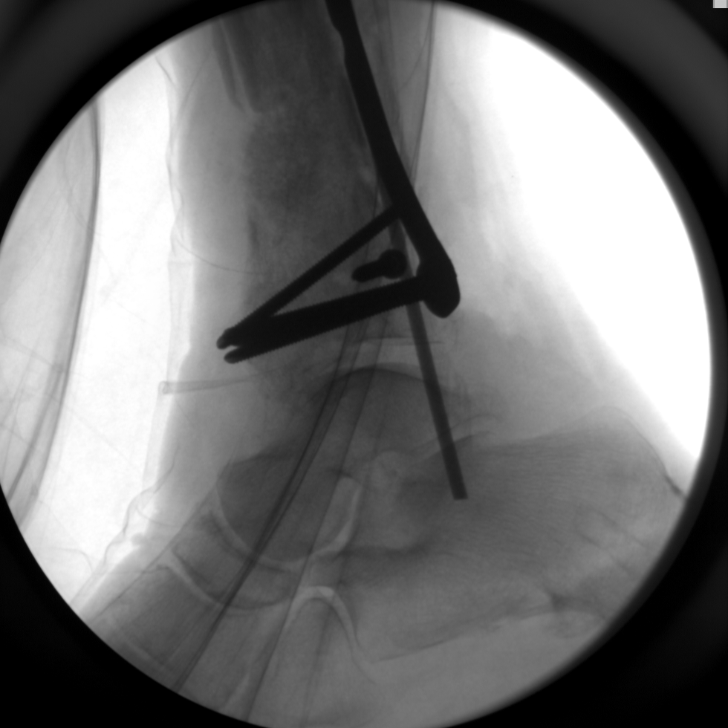
[im 7/9]
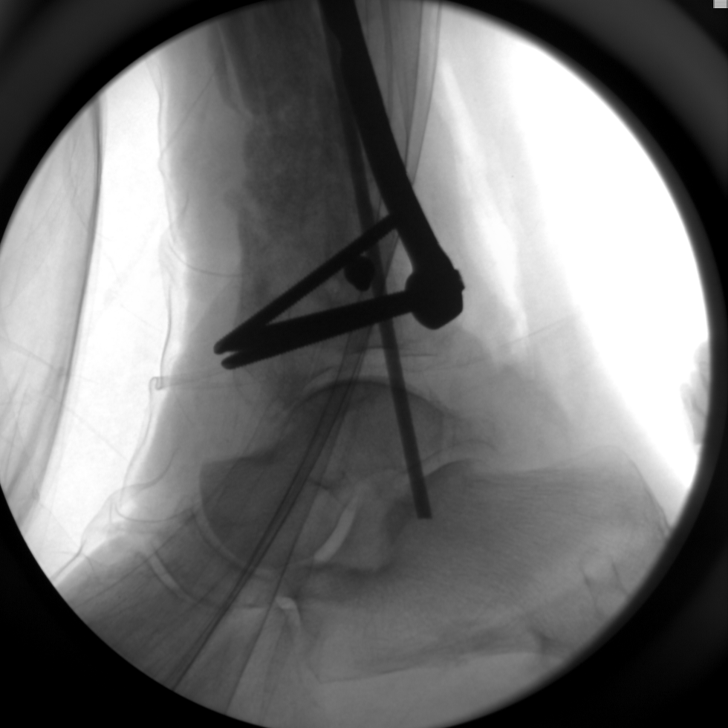
[im 8/9]
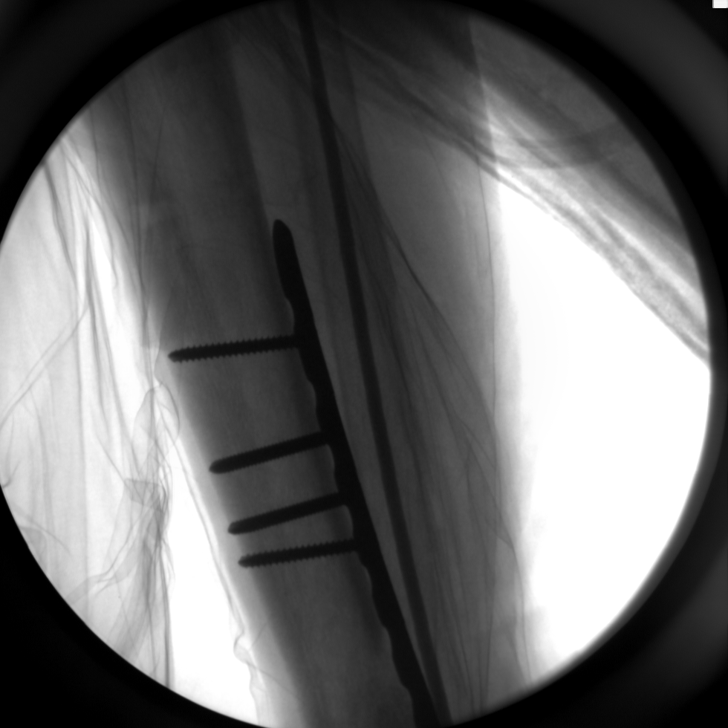
[im 9/9]
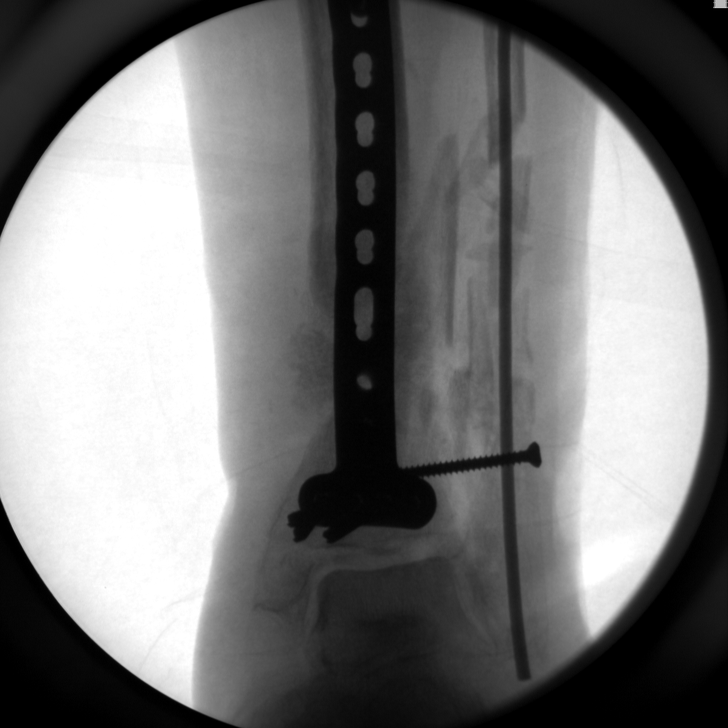

[9 of 9 positions shown; findings below may reference images not displayed]

FINDINGS: Intramedullary rod is in place within the right fibular
fracture.

Right tibia fracture reduced with side plate and screws and a
single solitary screw distally.  Incongruities of the tibial talar
articular surface.
IMPRESSION: Open reduction and internal fixation of the comminuted right tibial
fracture.

## 2013-08-12 ENCOUNTER — Ambulatory Visit
Admission: RE | Admit: 2013-08-12 | Discharge: 2013-08-12 | Disposition: A | Discharge status: Home / Self Care | Payer: Medicare Other | Source: Ambulatory Visit | Attending: Orthopedic Surgery | Admitting: Orthopedic Surgery

## 2013-08-12 ENCOUNTER — Other Ambulatory Visit: Payer: No Typology Code available for payment source

## 2013-08-12 DIAGNOSIS — M25571 Pain in right ankle and joints of right foot: Secondary | ICD-10-CM

## 2013-11-01 DIAGNOSIS — I2510 Atherosclerotic heart disease of native coronary artery without angina pectoris: Secondary | ICD-10-CM | POA: Diagnosis not present

## 2013-11-01 DIAGNOSIS — R32 Unspecified urinary incontinence: Secondary | ICD-10-CM | POA: Diagnosis not present

## 2013-11-01 DIAGNOSIS — I10 Essential (primary) hypertension: Secondary | ICD-10-CM | POA: Diagnosis not present

## 2013-11-01 DIAGNOSIS — IMO0002 Reserved for concepts with insufficient information to code with codable children: Secondary | ICD-10-CM | POA: Diagnosis not present

## 2013-11-01 DIAGNOSIS — T8189XA Other complications of procedures, not elsewhere classified, initial encounter: Secondary | ICD-10-CM | POA: Diagnosis not present

## 2013-11-01 DIAGNOSIS — Z95810 Presence of automatic (implantable) cardiac defibrillator: Secondary | ICD-10-CM | POA: Diagnosis not present

## 2013-11-01 DIAGNOSIS — F411 Generalized anxiety disorder: Secondary | ICD-10-CM | POA: Diagnosis not present

## 2013-11-01 DIAGNOSIS — E669 Obesity, unspecified: Secondary | ICD-10-CM | POA: Diagnosis not present

## 2013-11-01 DIAGNOSIS — L89109 Pressure ulcer of unspecified part of back, unspecified stage: Secondary | ICD-10-CM | POA: Diagnosis not present

## 2013-11-01 DIAGNOSIS — L8991 Pressure ulcer of unspecified site, stage 1: Secondary | ICD-10-CM | POA: Diagnosis not present

## 2013-11-01 DIAGNOSIS — Z8781 Personal history of (healed) traumatic fracture: Secondary | ICD-10-CM | POA: Diagnosis not present

## 2013-11-05 ENCOUNTER — Other Ambulatory Visit: Payer: Self-pay

## 2013-11-05 DIAGNOSIS — L89109 Pressure ulcer of unspecified part of back, unspecified stage: Secondary | ICD-10-CM | POA: Diagnosis not present

## 2013-11-05 DIAGNOSIS — L8991 Pressure ulcer of unspecified site, stage 1: Secondary | ICD-10-CM | POA: Diagnosis not present

## 2013-11-05 DIAGNOSIS — Z1231 Encounter for screening mammogram for malignant neoplasm of breast: Secondary | ICD-10-CM

## 2013-11-05 DIAGNOSIS — IMO0002 Reserved for concepts with insufficient information to code with codable children: Secondary | ICD-10-CM | POA: Diagnosis not present

## 2013-11-05 DIAGNOSIS — T8189XA Other complications of procedures, not elsewhere classified, initial encounter: Secondary | ICD-10-CM | POA: Diagnosis not present

## 2013-11-05 DIAGNOSIS — I10 Essential (primary) hypertension: Secondary | ICD-10-CM | POA: Diagnosis not present

## 2013-11-05 DIAGNOSIS — I2510 Atherosclerotic heart disease of native coronary artery without angina pectoris: Secondary | ICD-10-CM | POA: Diagnosis not present

## 2013-11-07 DIAGNOSIS — L8991 Pressure ulcer of unspecified site, stage 1: Secondary | ICD-10-CM | POA: Diagnosis not present

## 2013-11-07 DIAGNOSIS — I2510 Atherosclerotic heart disease of native coronary artery without angina pectoris: Secondary | ICD-10-CM | POA: Diagnosis not present

## 2013-11-07 DIAGNOSIS — L89109 Pressure ulcer of unspecified part of back, unspecified stage: Secondary | ICD-10-CM | POA: Diagnosis not present

## 2013-11-07 DIAGNOSIS — I10 Essential (primary) hypertension: Secondary | ICD-10-CM | POA: Diagnosis not present

## 2013-11-07 DIAGNOSIS — IMO0002 Reserved for concepts with insufficient information to code with codable children: Secondary | ICD-10-CM | POA: Diagnosis not present

## 2013-11-07 DIAGNOSIS — T8189XA Other complications of procedures, not elsewhere classified, initial encounter: Secondary | ICD-10-CM | POA: Diagnosis not present

## 2013-11-09 DIAGNOSIS — I2510 Atherosclerotic heart disease of native coronary artery without angina pectoris: Secondary | ICD-10-CM | POA: Diagnosis not present

## 2013-11-09 DIAGNOSIS — L8991 Pressure ulcer of unspecified site, stage 1: Secondary | ICD-10-CM | POA: Diagnosis not present

## 2013-11-09 DIAGNOSIS — T8189XA Other complications of procedures, not elsewhere classified, initial encounter: Secondary | ICD-10-CM | POA: Diagnosis not present

## 2013-11-09 DIAGNOSIS — I10 Essential (primary) hypertension: Secondary | ICD-10-CM | POA: Diagnosis not present

## 2013-11-09 DIAGNOSIS — IMO0002 Reserved for concepts with insufficient information to code with codable children: Secondary | ICD-10-CM | POA: Diagnosis not present

## 2013-11-09 DIAGNOSIS — L89109 Pressure ulcer of unspecified part of back, unspecified stage: Secondary | ICD-10-CM | POA: Diagnosis not present

## 2013-11-09 IMAGING — CT CT ANGIO CHEST
2 of 6 series · 19 of 46 positions shown · IV contrast (APPLIED)
Comparison: 12/25/2012 chest radiograph

CLINICAL DATA: 83-year-old female with chest pain and shortness of
breath.

CT ANGIOGRAPHY CHEST
TECHNIQUE: Multidetector CT imaging of the chest using the
standard protocol during bolus administration of intravenous
contrast. Multiplanar reconstructed images including MIPs were
obtained and reviewed to evaluate the vascular anatomy.
Contrast: 100mL OMNIPAQUE IOHEXOL 350 MG/ML SOLN

[Series 6: pulm embolism 1.0 b25f thin · axial · 0.61mm/px · z∈[+828,+1098]mm · 16 of 296 slices shown]
[im 13/296  lung]
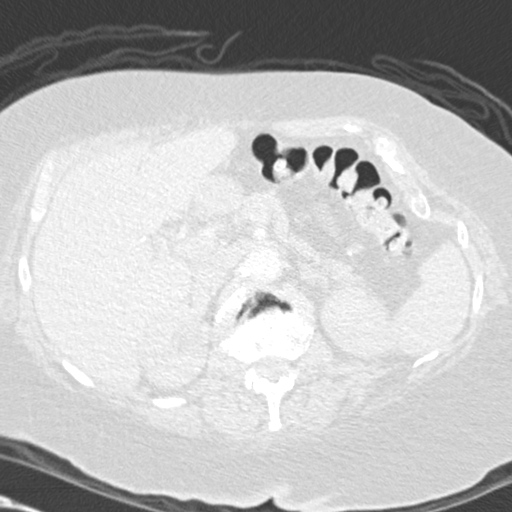
[im 39/296  soft-tissue]
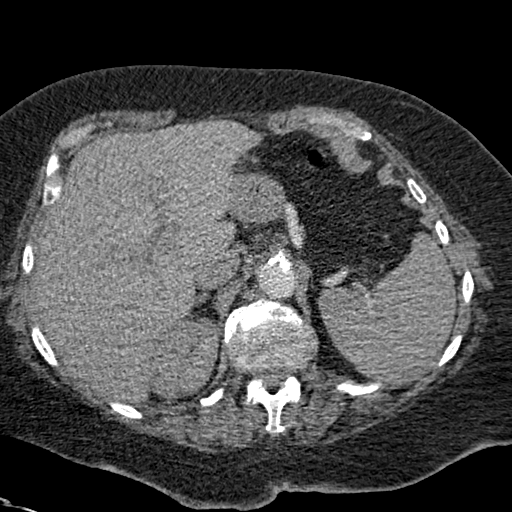
[im 52/296  lung]
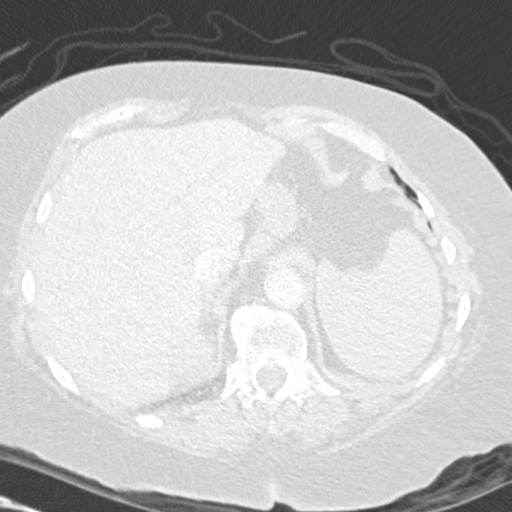
[im 65/296  soft-tissue]
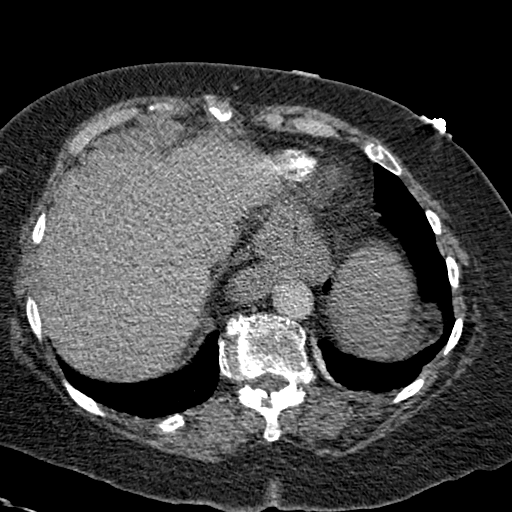
[im 90/296  lung]
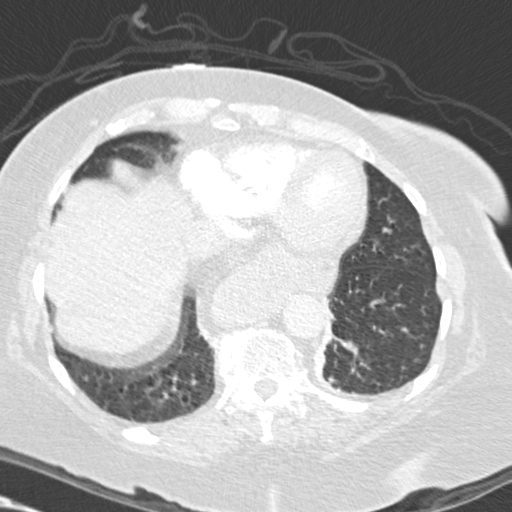
[im 103/296  soft-tissue]
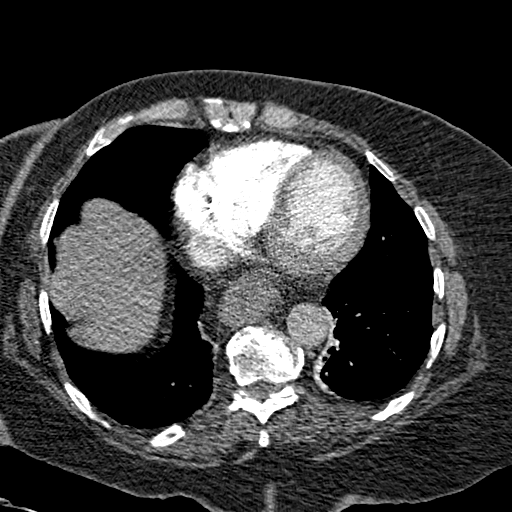
[im 116/296  lung]
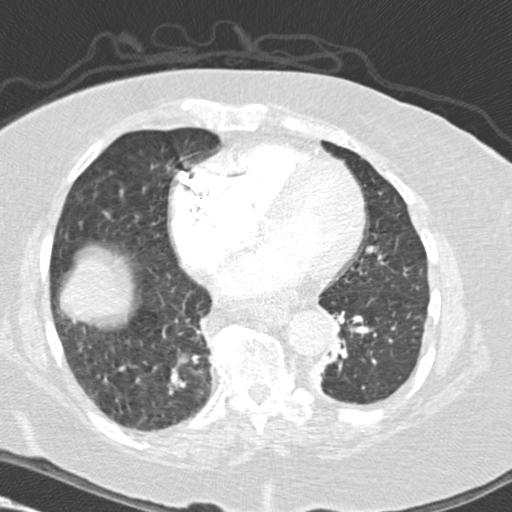
[im 142/296  soft-tissue]
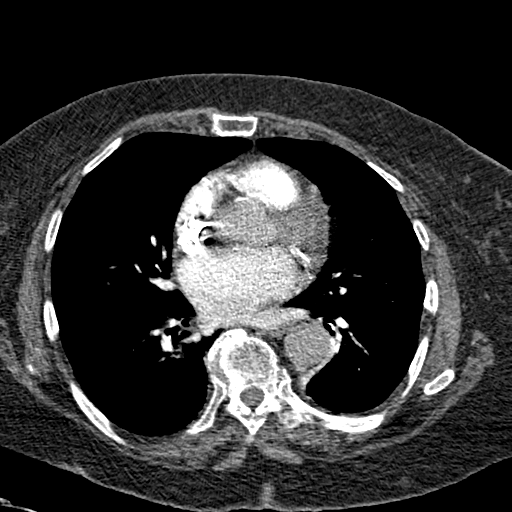
[im 154/296  lung]
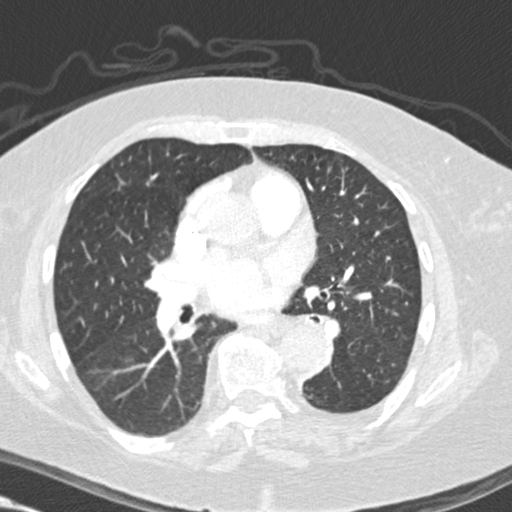
[im 180/296  soft-tissue]
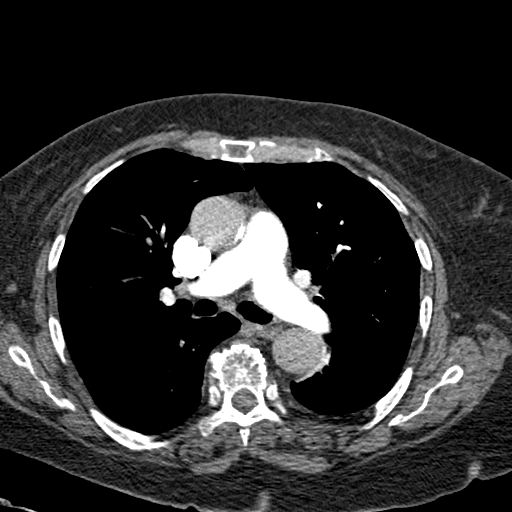
[im 193/296  lung]
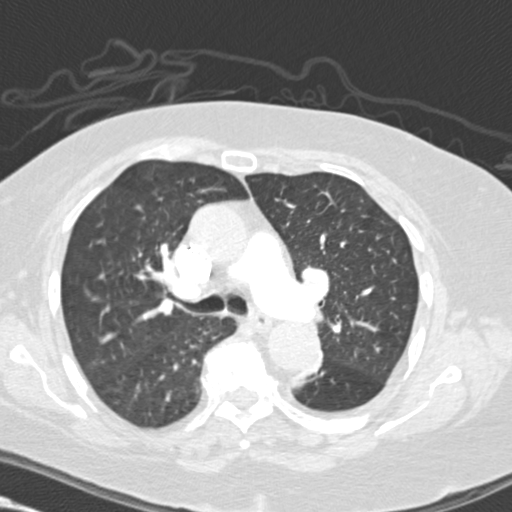
[im 206/296  soft-tissue]
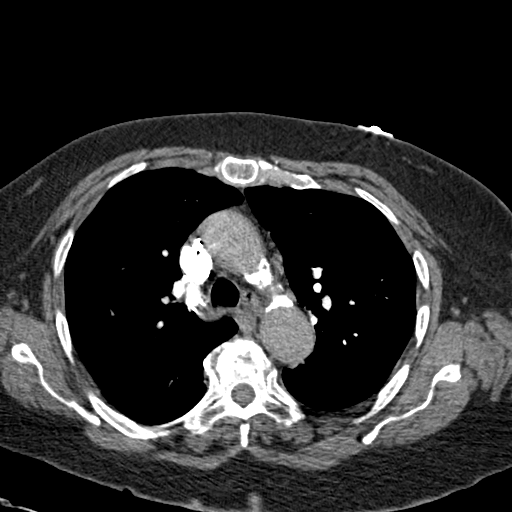
[im 231/296  lung]
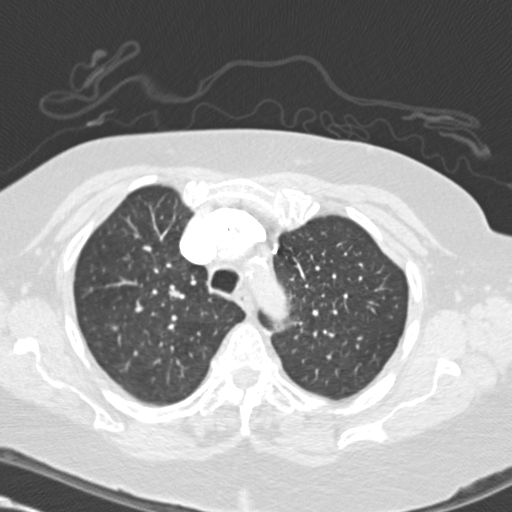
[im 244/296  soft-tissue]
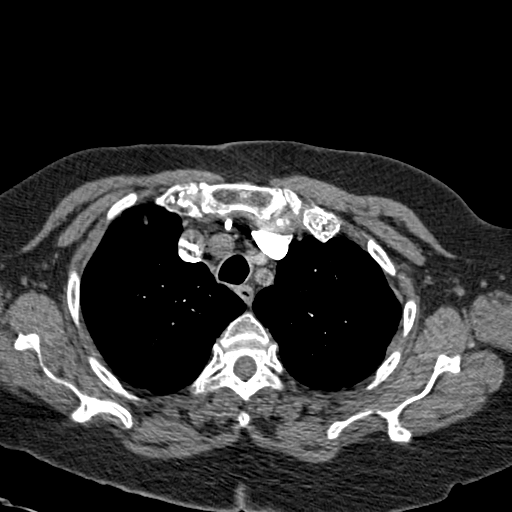
[im 257/296  lung]
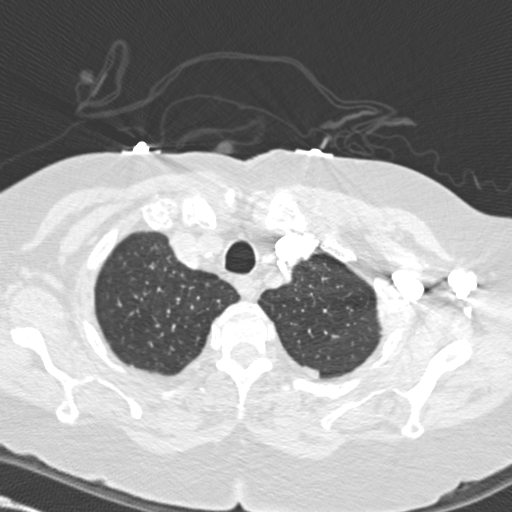
[im 283/296  soft-tissue]
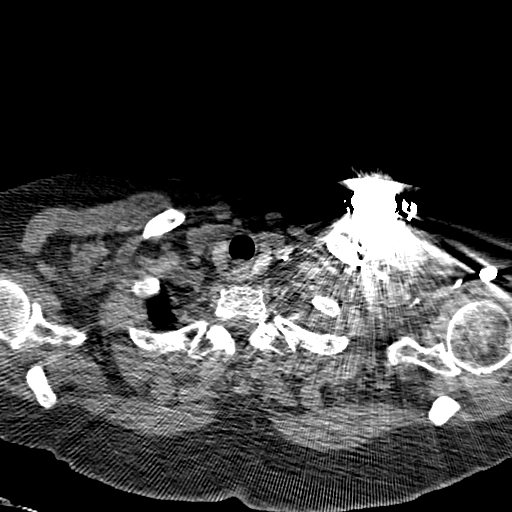

[Series 602: cor · coronal · 0.61mm/px · 3 of 128 slices shown]
[im 32/128  soft-tissue]
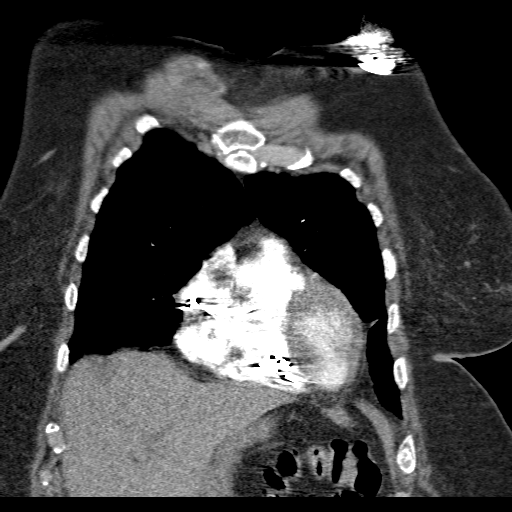
[im 64/128  soft-tissue]
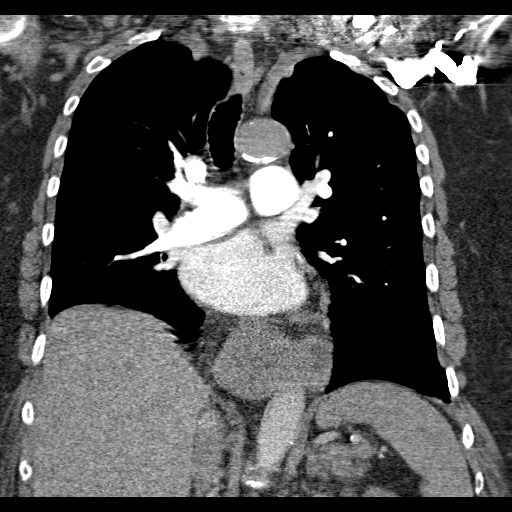
[im 96/128  soft-tissue]
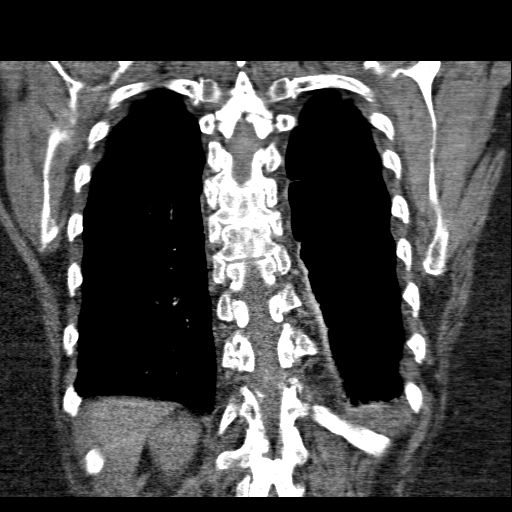

[19 of 46 positions shown; findings below may reference images not displayed]

FINDINGS: This is a technically adequate study but respiratory
motion artifact in the right lower lung slightly limits evaluation.

No pulmonary emboli are identified.
A left-sided pacemaker and moderate coronary artery calcifications
are present.
No pleural or pericardial effusions are identified.
No enlarged lymph nodes are present.
There is no evidence of thoracic aortic aneurysm.

There is no evidence of airspace disease, consolidation, suspicious
nodule, mass, or endobronchial or endotracheal lesion.
Mild scarring at the left lung base is noted.
No acute or suspicious bony abnormalities are identified.
A large hiatal hernia is noted.
IMPRESSION: No evidence of acute abnormality.  No evidence of pulmonary emboli.

Coronary artery disease.

Large hiatal hernia.

## 2013-11-13 DIAGNOSIS — I2510 Atherosclerotic heart disease of native coronary artery without angina pectoris: Secondary | ICD-10-CM | POA: Diagnosis not present

## 2013-11-13 DIAGNOSIS — IMO0002 Reserved for concepts with insufficient information to code with codable children: Secondary | ICD-10-CM | POA: Diagnosis not present

## 2013-11-13 DIAGNOSIS — T8189XA Other complications of procedures, not elsewhere classified, initial encounter: Secondary | ICD-10-CM | POA: Diagnosis not present

## 2013-11-13 DIAGNOSIS — L89109 Pressure ulcer of unspecified part of back, unspecified stage: Secondary | ICD-10-CM | POA: Diagnosis not present

## 2013-11-13 DIAGNOSIS — I10 Essential (primary) hypertension: Secondary | ICD-10-CM | POA: Diagnosis not present

## 2013-11-13 DIAGNOSIS — L8991 Pressure ulcer of unspecified site, stage 1: Secondary | ICD-10-CM | POA: Diagnosis not present

## 2013-11-15 DIAGNOSIS — I10 Essential (primary) hypertension: Secondary | ICD-10-CM | POA: Diagnosis not present

## 2013-11-15 DIAGNOSIS — IMO0002 Reserved for concepts with insufficient information to code with codable children: Secondary | ICD-10-CM | POA: Diagnosis not present

## 2013-11-15 DIAGNOSIS — L8991 Pressure ulcer of unspecified site, stage 1: Secondary | ICD-10-CM | POA: Diagnosis not present

## 2013-11-15 DIAGNOSIS — I2510 Atherosclerotic heart disease of native coronary artery without angina pectoris: Secondary | ICD-10-CM | POA: Diagnosis not present

## 2013-11-15 DIAGNOSIS — L89109 Pressure ulcer of unspecified part of back, unspecified stage: Secondary | ICD-10-CM | POA: Diagnosis not present

## 2013-11-15 DIAGNOSIS — T8189XA Other complications of procedures, not elsewhere classified, initial encounter: Secondary | ICD-10-CM | POA: Diagnosis not present

## 2013-11-19 DIAGNOSIS — I2510 Atherosclerotic heart disease of native coronary artery without angina pectoris: Secondary | ICD-10-CM | POA: Diagnosis not present

## 2013-11-19 DIAGNOSIS — IMO0002 Reserved for concepts with insufficient information to code with codable children: Secondary | ICD-10-CM | POA: Diagnosis not present

## 2013-11-19 DIAGNOSIS — L89109 Pressure ulcer of unspecified part of back, unspecified stage: Secondary | ICD-10-CM | POA: Diagnosis not present

## 2013-11-19 DIAGNOSIS — T8189XA Other complications of procedures, not elsewhere classified, initial encounter: Secondary | ICD-10-CM | POA: Diagnosis not present

## 2013-11-19 DIAGNOSIS — I10 Essential (primary) hypertension: Secondary | ICD-10-CM | POA: Diagnosis not present

## 2013-11-19 DIAGNOSIS — L8991 Pressure ulcer of unspecified site, stage 1: Secondary | ICD-10-CM | POA: Diagnosis not present

## 2013-11-21 DIAGNOSIS — I2510 Atherosclerotic heart disease of native coronary artery without angina pectoris: Secondary | ICD-10-CM | POA: Diagnosis not present

## 2013-11-21 DIAGNOSIS — I10 Essential (primary) hypertension: Secondary | ICD-10-CM | POA: Diagnosis not present

## 2013-11-21 DIAGNOSIS — IMO0002 Reserved for concepts with insufficient information to code with codable children: Secondary | ICD-10-CM | POA: Diagnosis not present

## 2013-11-21 DIAGNOSIS — L8991 Pressure ulcer of unspecified site, stage 1: Secondary | ICD-10-CM | POA: Diagnosis not present

## 2013-11-21 DIAGNOSIS — T8189XA Other complications of procedures, not elsewhere classified, initial encounter: Secondary | ICD-10-CM | POA: Diagnosis not present

## 2013-11-21 DIAGNOSIS — L89109 Pressure ulcer of unspecified part of back, unspecified stage: Secondary | ICD-10-CM | POA: Diagnosis not present

## 2013-11-23 DIAGNOSIS — T8189XA Other complications of procedures, not elsewhere classified, initial encounter: Secondary | ICD-10-CM | POA: Diagnosis not present

## 2013-11-23 DIAGNOSIS — L89109 Pressure ulcer of unspecified part of back, unspecified stage: Secondary | ICD-10-CM | POA: Diagnosis not present

## 2013-11-23 DIAGNOSIS — L8991 Pressure ulcer of unspecified site, stage 1: Secondary | ICD-10-CM | POA: Diagnosis not present

## 2013-11-23 DIAGNOSIS — I10 Essential (primary) hypertension: Secondary | ICD-10-CM | POA: Diagnosis not present

## 2013-11-23 DIAGNOSIS — IMO0002 Reserved for concepts with insufficient information to code with codable children: Secondary | ICD-10-CM | POA: Diagnosis not present

## 2013-11-23 DIAGNOSIS — I2510 Atherosclerotic heart disease of native coronary artery without angina pectoris: Secondary | ICD-10-CM | POA: Diagnosis not present

## 2013-11-27 DIAGNOSIS — L89109 Pressure ulcer of unspecified part of back, unspecified stage: Secondary | ICD-10-CM | POA: Diagnosis not present

## 2013-11-27 DIAGNOSIS — I10 Essential (primary) hypertension: Secondary | ICD-10-CM | POA: Diagnosis not present

## 2013-11-27 DIAGNOSIS — IMO0002 Reserved for concepts with insufficient information to code with codable children: Secondary | ICD-10-CM | POA: Diagnosis not present

## 2013-11-27 DIAGNOSIS — T8189XA Other complications of procedures, not elsewhere classified, initial encounter: Secondary | ICD-10-CM | POA: Diagnosis not present

## 2013-11-27 DIAGNOSIS — L8991 Pressure ulcer of unspecified site, stage 1: Secondary | ICD-10-CM | POA: Diagnosis not present

## 2013-11-27 DIAGNOSIS — I2510 Atherosclerotic heart disease of native coronary artery without angina pectoris: Secondary | ICD-10-CM | POA: Diagnosis not present

## 2013-11-29 DIAGNOSIS — I2510 Atherosclerotic heart disease of native coronary artery without angina pectoris: Secondary | ICD-10-CM | POA: Diagnosis not present

## 2013-11-29 DIAGNOSIS — L8991 Pressure ulcer of unspecified site, stage 1: Secondary | ICD-10-CM | POA: Diagnosis not present

## 2013-11-29 DIAGNOSIS — IMO0002 Reserved for concepts with insufficient information to code with codable children: Secondary | ICD-10-CM | POA: Diagnosis not present

## 2013-11-29 DIAGNOSIS — I10 Essential (primary) hypertension: Secondary | ICD-10-CM | POA: Diagnosis not present

## 2013-11-29 DIAGNOSIS — T8189XA Other complications of procedures, not elsewhere classified, initial encounter: Secondary | ICD-10-CM | POA: Diagnosis not present

## 2013-11-29 DIAGNOSIS — L89109 Pressure ulcer of unspecified part of back, unspecified stage: Secondary | ICD-10-CM | POA: Diagnosis not present

## 2013-12-02 DIAGNOSIS — I10 Essential (primary) hypertension: Secondary | ICD-10-CM | POA: Diagnosis not present

## 2013-12-02 DIAGNOSIS — E669 Obesity, unspecified: Secondary | ICD-10-CM | POA: Diagnosis not present

## 2013-12-02 DIAGNOSIS — M129 Arthropathy, unspecified: Secondary | ICD-10-CM | POA: Diagnosis not present

## 2013-12-02 DIAGNOSIS — I499 Cardiac arrhythmia, unspecified: Secondary | ICD-10-CM | POA: Diagnosis not present

## 2013-12-02 DIAGNOSIS — L02219 Cutaneous abscess of trunk, unspecified: Secondary | ICD-10-CM | POA: Diagnosis not present

## 2013-12-02 DIAGNOSIS — L98499 Non-pressure chronic ulcer of skin of other sites with unspecified severity: Secondary | ICD-10-CM | POA: Diagnosis not present

## 2013-12-02 DIAGNOSIS — T859XXA Unspecified complication of internal prosthetic device, implant and graft, initial encounter: Secondary | ICD-10-CM | POA: Diagnosis not present

## 2013-12-02 DIAGNOSIS — Z950 Presence of cardiac pacemaker: Secondary | ICD-10-CM | POA: Diagnosis not present

## 2013-12-02 DIAGNOSIS — K458 Other specified abdominal hernia without obstruction or gangrene: Secondary | ICD-10-CM | POA: Diagnosis not present

## 2013-12-02 DIAGNOSIS — Z7982 Long term (current) use of aspirin: Secondary | ICD-10-CM | POA: Diagnosis not present

## 2013-12-04 DIAGNOSIS — IMO0002 Reserved for concepts with insufficient information to code with codable children: Secondary | ICD-10-CM | POA: Diagnosis not present

## 2013-12-04 DIAGNOSIS — I2510 Atherosclerotic heart disease of native coronary artery without angina pectoris: Secondary | ICD-10-CM | POA: Diagnosis not present

## 2013-12-04 DIAGNOSIS — L89109 Pressure ulcer of unspecified part of back, unspecified stage: Secondary | ICD-10-CM | POA: Diagnosis not present

## 2013-12-04 DIAGNOSIS — I10 Essential (primary) hypertension: Secondary | ICD-10-CM | POA: Diagnosis not present

## 2013-12-04 DIAGNOSIS — L8991 Pressure ulcer of unspecified site, stage 1: Secondary | ICD-10-CM | POA: Diagnosis not present

## 2013-12-04 DIAGNOSIS — T8189XA Other complications of procedures, not elsewhere classified, initial encounter: Secondary | ICD-10-CM | POA: Diagnosis not present

## 2013-12-06 DIAGNOSIS — IMO0002 Reserved for concepts with insufficient information to code with codable children: Secondary | ICD-10-CM | POA: Diagnosis not present

## 2013-12-06 DIAGNOSIS — I10 Essential (primary) hypertension: Secondary | ICD-10-CM | POA: Diagnosis not present

## 2013-12-06 DIAGNOSIS — L89109 Pressure ulcer of unspecified part of back, unspecified stage: Secondary | ICD-10-CM | POA: Diagnosis not present

## 2013-12-06 DIAGNOSIS — I2510 Atherosclerotic heart disease of native coronary artery without angina pectoris: Secondary | ICD-10-CM | POA: Diagnosis not present

## 2013-12-06 DIAGNOSIS — L8991 Pressure ulcer of unspecified site, stage 1: Secondary | ICD-10-CM | POA: Diagnosis not present

## 2013-12-06 DIAGNOSIS — T8189XA Other complications of procedures, not elsewhere classified, initial encounter: Secondary | ICD-10-CM | POA: Diagnosis not present

## 2013-12-08 DIAGNOSIS — IMO0002 Reserved for concepts with insufficient information to code with codable children: Secondary | ICD-10-CM | POA: Diagnosis not present

## 2013-12-08 DIAGNOSIS — I2510 Atherosclerotic heart disease of native coronary artery without angina pectoris: Secondary | ICD-10-CM | POA: Diagnosis not present

## 2013-12-08 DIAGNOSIS — L8991 Pressure ulcer of unspecified site, stage 1: Secondary | ICD-10-CM | POA: Diagnosis not present

## 2013-12-08 DIAGNOSIS — T8189XA Other complications of procedures, not elsewhere classified, initial encounter: Secondary | ICD-10-CM | POA: Diagnosis not present

## 2013-12-08 DIAGNOSIS — L89109 Pressure ulcer of unspecified part of back, unspecified stage: Secondary | ICD-10-CM | POA: Diagnosis not present

## 2013-12-08 DIAGNOSIS — I10 Essential (primary) hypertension: Secondary | ICD-10-CM | POA: Diagnosis not present

## 2013-12-09 ENCOUNTER — Ambulatory Visit
Admission: RE | Admit: 2013-12-09 | Discharge: 2013-12-09 | Disposition: A | Discharge status: Home / Self Care | Payer: Medicare Other | Source: Ambulatory Visit | Attending: Geriatric Medicine | Admitting: Geriatric Medicine

## 2013-12-09 ENCOUNTER — Other Ambulatory Visit: Payer: Self-pay | Admitting: Geriatric Medicine

## 2013-12-09 ENCOUNTER — Ambulatory Visit
Admission: RE | Admit: 2013-12-09 | Discharge: 2013-12-09 | Disposition: A | Discharge status: Home / Self Care | Payer: Medicare Other | Source: Ambulatory Visit

## 2013-12-09 DIAGNOSIS — Z853 Personal history of malignant neoplasm of breast: Secondary | ICD-10-CM

## 2013-12-09 DIAGNOSIS — Z1231 Encounter for screening mammogram for malignant neoplasm of breast: Secondary | ICD-10-CM

## 2013-12-10 DIAGNOSIS — IMO0002 Reserved for concepts with insufficient information to code with codable children: Secondary | ICD-10-CM | POA: Diagnosis not present

## 2013-12-10 DIAGNOSIS — L89109 Pressure ulcer of unspecified part of back, unspecified stage: Secondary | ICD-10-CM | POA: Diagnosis not present

## 2013-12-10 DIAGNOSIS — T8189XA Other complications of procedures, not elsewhere classified, initial encounter: Secondary | ICD-10-CM | POA: Diagnosis not present

## 2013-12-10 DIAGNOSIS — L8991 Pressure ulcer of unspecified site, stage 1: Secondary | ICD-10-CM | POA: Diagnosis not present

## 2013-12-10 DIAGNOSIS — I10 Essential (primary) hypertension: Secondary | ICD-10-CM | POA: Diagnosis not present

## 2013-12-10 DIAGNOSIS — I2510 Atherosclerotic heart disease of native coronary artery without angina pectoris: Secondary | ICD-10-CM | POA: Diagnosis not present

## 2013-12-11 DIAGNOSIS — Z950 Presence of cardiac pacemaker: Secondary | ICD-10-CM | POA: Diagnosis not present

## 2013-12-11 DIAGNOSIS — E7800 Pure hypercholesterolemia, unspecified: Secondary | ICD-10-CM | POA: Diagnosis not present

## 2013-12-11 DIAGNOSIS — I2510 Atherosclerotic heart disease of native coronary artery without angina pectoris: Secondary | ICD-10-CM | POA: Diagnosis not present

## 2013-12-12 DIAGNOSIS — T8189XA Other complications of procedures, not elsewhere classified, initial encounter: Secondary | ICD-10-CM | POA: Diagnosis not present

## 2013-12-12 DIAGNOSIS — L8991 Pressure ulcer of unspecified site, stage 1: Secondary | ICD-10-CM | POA: Diagnosis not present

## 2013-12-12 DIAGNOSIS — L89109 Pressure ulcer of unspecified part of back, unspecified stage: Secondary | ICD-10-CM | POA: Diagnosis not present

## 2013-12-12 DIAGNOSIS — I2510 Atherosclerotic heart disease of native coronary artery without angina pectoris: Secondary | ICD-10-CM | POA: Diagnosis not present

## 2013-12-12 DIAGNOSIS — IMO0002 Reserved for concepts with insufficient information to code with codable children: Secondary | ICD-10-CM | POA: Diagnosis not present

## 2013-12-12 DIAGNOSIS — I10 Essential (primary) hypertension: Secondary | ICD-10-CM | POA: Diagnosis not present

## 2013-12-15 DIAGNOSIS — L89109 Pressure ulcer of unspecified part of back, unspecified stage: Secondary | ICD-10-CM | POA: Diagnosis not present

## 2013-12-15 DIAGNOSIS — L98499 Non-pressure chronic ulcer of skin of other sites with unspecified severity: Secondary | ICD-10-CM | POA: Diagnosis not present

## 2013-12-15 DIAGNOSIS — T8189XA Other complications of procedures, not elsewhere classified, initial encounter: Secondary | ICD-10-CM | POA: Diagnosis not present

## 2013-12-15 DIAGNOSIS — IMO0002 Reserved for concepts with insufficient information to code with codable children: Secondary | ICD-10-CM | POA: Diagnosis not present

## 2013-12-15 DIAGNOSIS — L8991 Pressure ulcer of unspecified site, stage 1: Secondary | ICD-10-CM | POA: Diagnosis not present

## 2013-12-15 DIAGNOSIS — T859XXA Unspecified complication of internal prosthetic device, implant and graft, initial encounter: Secondary | ICD-10-CM | POA: Diagnosis not present

## 2013-12-15 DIAGNOSIS — I10 Essential (primary) hypertension: Secondary | ICD-10-CM | POA: Diagnosis not present

## 2013-12-15 DIAGNOSIS — L02219 Cutaneous abscess of trunk, unspecified: Secondary | ICD-10-CM | POA: Diagnosis not present

## 2013-12-15 DIAGNOSIS — I499 Cardiac arrhythmia, unspecified: Secondary | ICD-10-CM | POA: Diagnosis not present

## 2013-12-15 DIAGNOSIS — K458 Other specified abdominal hernia without obstruction or gangrene: Secondary | ICD-10-CM | POA: Diagnosis not present

## 2013-12-17 DIAGNOSIS — I2510 Atherosclerotic heart disease of native coronary artery without angina pectoris: Secondary | ICD-10-CM | POA: Diagnosis not present

## 2013-12-17 DIAGNOSIS — IMO0002 Reserved for concepts with insufficient information to code with codable children: Secondary | ICD-10-CM | POA: Diagnosis not present

## 2013-12-17 DIAGNOSIS — L8991 Pressure ulcer of unspecified site, stage 1: Secondary | ICD-10-CM | POA: Diagnosis not present

## 2013-12-17 DIAGNOSIS — T8189XA Other complications of procedures, not elsewhere classified, initial encounter: Secondary | ICD-10-CM | POA: Diagnosis not present

## 2013-12-17 DIAGNOSIS — L89109 Pressure ulcer of unspecified part of back, unspecified stage: Secondary | ICD-10-CM | POA: Diagnosis not present

## 2013-12-17 DIAGNOSIS — I10 Essential (primary) hypertension: Secondary | ICD-10-CM | POA: Diagnosis not present

## 2013-12-19 DIAGNOSIS — L89109 Pressure ulcer of unspecified part of back, unspecified stage: Secondary | ICD-10-CM | POA: Diagnosis not present

## 2013-12-19 DIAGNOSIS — IMO0002 Reserved for concepts with insufficient information to code with codable children: Secondary | ICD-10-CM | POA: Diagnosis not present

## 2013-12-19 DIAGNOSIS — L8991 Pressure ulcer of unspecified site, stage 1: Secondary | ICD-10-CM | POA: Diagnosis not present

## 2013-12-19 DIAGNOSIS — I2510 Atherosclerotic heart disease of native coronary artery without angina pectoris: Secondary | ICD-10-CM | POA: Diagnosis not present

## 2013-12-19 DIAGNOSIS — T8189XA Other complications of procedures, not elsewhere classified, initial encounter: Secondary | ICD-10-CM | POA: Diagnosis not present

## 2013-12-19 DIAGNOSIS — I10 Essential (primary) hypertension: Secondary | ICD-10-CM | POA: Diagnosis not present

## 2013-12-21 DIAGNOSIS — I2510 Atherosclerotic heart disease of native coronary artery without angina pectoris: Secondary | ICD-10-CM | POA: Diagnosis not present

## 2013-12-21 DIAGNOSIS — T8189XA Other complications of procedures, not elsewhere classified, initial encounter: Secondary | ICD-10-CM | POA: Diagnosis not present

## 2013-12-21 DIAGNOSIS — IMO0002 Reserved for concepts with insufficient information to code with codable children: Secondary | ICD-10-CM | POA: Diagnosis not present

## 2013-12-21 DIAGNOSIS — I10 Essential (primary) hypertension: Secondary | ICD-10-CM | POA: Diagnosis not present

## 2013-12-21 DIAGNOSIS — L89109 Pressure ulcer of unspecified part of back, unspecified stage: Secondary | ICD-10-CM | POA: Diagnosis not present

## 2013-12-21 DIAGNOSIS — L8991 Pressure ulcer of unspecified site, stage 1: Secondary | ICD-10-CM | POA: Diagnosis not present

## 2013-12-23 DIAGNOSIS — T8189XA Other complications of procedures, not elsewhere classified, initial encounter: Secondary | ICD-10-CM | POA: Diagnosis not present

## 2013-12-23 DIAGNOSIS — L8991 Pressure ulcer of unspecified site, stage 1: Secondary | ICD-10-CM | POA: Diagnosis not present

## 2013-12-23 DIAGNOSIS — L89109 Pressure ulcer of unspecified part of back, unspecified stage: Secondary | ICD-10-CM | POA: Diagnosis not present

## 2013-12-23 DIAGNOSIS — I10 Essential (primary) hypertension: Secondary | ICD-10-CM | POA: Diagnosis not present

## 2013-12-23 DIAGNOSIS — IMO0002 Reserved for concepts with insufficient information to code with codable children: Secondary | ICD-10-CM | POA: Diagnosis not present

## 2013-12-23 DIAGNOSIS — I2510 Atherosclerotic heart disease of native coronary artery without angina pectoris: Secondary | ICD-10-CM | POA: Diagnosis not present

## 2013-12-25 DIAGNOSIS — T8189XA Other complications of procedures, not elsewhere classified, initial encounter: Secondary | ICD-10-CM | POA: Diagnosis not present

## 2013-12-25 DIAGNOSIS — I2510 Atherosclerotic heart disease of native coronary artery without angina pectoris: Secondary | ICD-10-CM | POA: Diagnosis not present

## 2013-12-25 DIAGNOSIS — L8991 Pressure ulcer of unspecified site, stage 1: Secondary | ICD-10-CM | POA: Diagnosis not present

## 2013-12-25 DIAGNOSIS — IMO0002 Reserved for concepts with insufficient information to code with codable children: Secondary | ICD-10-CM | POA: Diagnosis not present

## 2013-12-25 DIAGNOSIS — I10 Essential (primary) hypertension: Secondary | ICD-10-CM | POA: Diagnosis not present

## 2013-12-25 DIAGNOSIS — L89109 Pressure ulcer of unspecified part of back, unspecified stage: Secondary | ICD-10-CM | POA: Diagnosis not present

## 2013-12-27 DIAGNOSIS — L89109 Pressure ulcer of unspecified part of back, unspecified stage: Secondary | ICD-10-CM | POA: Diagnosis not present

## 2013-12-27 DIAGNOSIS — IMO0002 Reserved for concepts with insufficient information to code with codable children: Secondary | ICD-10-CM | POA: Diagnosis not present

## 2013-12-27 DIAGNOSIS — I10 Essential (primary) hypertension: Secondary | ICD-10-CM | POA: Diagnosis not present

## 2013-12-27 DIAGNOSIS — T8189XA Other complications of procedures, not elsewhere classified, initial encounter: Secondary | ICD-10-CM | POA: Diagnosis not present

## 2013-12-27 DIAGNOSIS — I2510 Atherosclerotic heart disease of native coronary artery without angina pectoris: Secondary | ICD-10-CM | POA: Diagnosis not present

## 2013-12-27 DIAGNOSIS — L8991 Pressure ulcer of unspecified site, stage 1: Secondary | ICD-10-CM | POA: Diagnosis not present

## 2013-12-29 DIAGNOSIS — L02219 Cutaneous abscess of trunk, unspecified: Secondary | ICD-10-CM | POA: Diagnosis not present

## 2013-12-29 DIAGNOSIS — Z950 Presence of cardiac pacemaker: Secondary | ICD-10-CM | POA: Diagnosis not present

## 2013-12-29 DIAGNOSIS — M129 Arthropathy, unspecified: Secondary | ICD-10-CM | POA: Diagnosis not present

## 2013-12-29 DIAGNOSIS — K458 Other specified abdominal hernia without obstruction or gangrene: Secondary | ICD-10-CM | POA: Diagnosis not present

## 2013-12-29 DIAGNOSIS — Z7982 Long term (current) use of aspirin: Secondary | ICD-10-CM | POA: Diagnosis not present

## 2013-12-29 DIAGNOSIS — E669 Obesity, unspecified: Secondary | ICD-10-CM | POA: Diagnosis not present

## 2013-12-29 DIAGNOSIS — I10 Essential (primary) hypertension: Secondary | ICD-10-CM | POA: Diagnosis not present

## 2013-12-29 DIAGNOSIS — T859XXA Unspecified complication of internal prosthetic device, implant and graft, initial encounter: Secondary | ICD-10-CM | POA: Diagnosis not present

## 2013-12-29 DIAGNOSIS — L89109 Pressure ulcer of unspecified part of back, unspecified stage: Secondary | ICD-10-CM | POA: Diagnosis not present

## 2013-12-29 DIAGNOSIS — L8991 Pressure ulcer of unspecified site, stage 1: Secondary | ICD-10-CM | POA: Diagnosis not present

## 2013-12-29 DIAGNOSIS — I499 Cardiac arrhythmia, unspecified: Secondary | ICD-10-CM | POA: Diagnosis not present

## 2013-12-29 DIAGNOSIS — L98499 Non-pressure chronic ulcer of skin of other sites with unspecified severity: Secondary | ICD-10-CM | POA: Diagnosis not present

## 2013-12-30 DIAGNOSIS — IMO0002 Reserved for concepts with insufficient information to code with codable children: Secondary | ICD-10-CM | POA: Diagnosis not present

## 2013-12-30 DIAGNOSIS — I10 Essential (primary) hypertension: Secondary | ICD-10-CM | POA: Diagnosis not present

## 2013-12-30 DIAGNOSIS — L89109 Pressure ulcer of unspecified part of back, unspecified stage: Secondary | ICD-10-CM | POA: Diagnosis not present

## 2013-12-30 DIAGNOSIS — L8991 Pressure ulcer of unspecified site, stage 1: Secondary | ICD-10-CM | POA: Diagnosis not present

## 2013-12-30 DIAGNOSIS — T8189XA Other complications of procedures, not elsewhere classified, initial encounter: Secondary | ICD-10-CM | POA: Diagnosis not present

## 2013-12-30 DIAGNOSIS — I2510 Atherosclerotic heart disease of native coronary artery without angina pectoris: Secondary | ICD-10-CM | POA: Diagnosis not present

## 2013-12-31 DIAGNOSIS — R32 Unspecified urinary incontinence: Secondary | ICD-10-CM | POA: Diagnosis not present

## 2013-12-31 DIAGNOSIS — IMO0002 Reserved for concepts with insufficient information to code with codable children: Secondary | ICD-10-CM | POA: Diagnosis not present

## 2013-12-31 DIAGNOSIS — T8189XA Other complications of procedures, not elsewhere classified, initial encounter: Secondary | ICD-10-CM | POA: Diagnosis not present

## 2013-12-31 DIAGNOSIS — I2510 Atherosclerotic heart disease of native coronary artery without angina pectoris: Secondary | ICD-10-CM | POA: Diagnosis not present

## 2013-12-31 DIAGNOSIS — Z8781 Personal history of (healed) traumatic fracture: Secondary | ICD-10-CM | POA: Diagnosis not present

## 2013-12-31 DIAGNOSIS — E669 Obesity, unspecified: Secondary | ICD-10-CM | POA: Diagnosis not present

## 2013-12-31 DIAGNOSIS — F411 Generalized anxiety disorder: Secondary | ICD-10-CM | POA: Diagnosis not present

## 2013-12-31 DIAGNOSIS — Z95810 Presence of automatic (implantable) cardiac defibrillator: Secondary | ICD-10-CM | POA: Diagnosis not present

## 2013-12-31 DIAGNOSIS — I10 Essential (primary) hypertension: Secondary | ICD-10-CM | POA: Diagnosis not present

## 2014-01-02 DIAGNOSIS — I10 Essential (primary) hypertension: Secondary | ICD-10-CM | POA: Diagnosis not present

## 2014-01-02 DIAGNOSIS — R32 Unspecified urinary incontinence: Secondary | ICD-10-CM | POA: Diagnosis not present

## 2014-01-02 DIAGNOSIS — T8189XA Other complications of procedures, not elsewhere classified, initial encounter: Secondary | ICD-10-CM | POA: Diagnosis not present

## 2014-01-02 DIAGNOSIS — I2510 Atherosclerotic heart disease of native coronary artery without angina pectoris: Secondary | ICD-10-CM | POA: Diagnosis not present

## 2014-01-02 DIAGNOSIS — F411 Generalized anxiety disorder: Secondary | ICD-10-CM | POA: Diagnosis not present

## 2014-01-02 DIAGNOSIS — IMO0002 Reserved for concepts with insufficient information to code with codable children: Secondary | ICD-10-CM | POA: Diagnosis not present

## 2014-01-04 DIAGNOSIS — R32 Unspecified urinary incontinence: Secondary | ICD-10-CM | POA: Diagnosis not present

## 2014-01-04 DIAGNOSIS — T8189XA Other complications of procedures, not elsewhere classified, initial encounter: Secondary | ICD-10-CM | POA: Diagnosis not present

## 2014-01-04 DIAGNOSIS — IMO0002 Reserved for concepts with insufficient information to code with codable children: Secondary | ICD-10-CM | POA: Diagnosis not present

## 2014-01-04 DIAGNOSIS — I10 Essential (primary) hypertension: Secondary | ICD-10-CM | POA: Diagnosis not present

## 2014-01-04 DIAGNOSIS — I2510 Atherosclerotic heart disease of native coronary artery without angina pectoris: Secondary | ICD-10-CM | POA: Diagnosis not present

## 2014-01-04 DIAGNOSIS — F411 Generalized anxiety disorder: Secondary | ICD-10-CM | POA: Diagnosis not present

## 2014-01-06 DIAGNOSIS — I2510 Atherosclerotic heart disease of native coronary artery without angina pectoris: Secondary | ICD-10-CM | POA: Diagnosis not present

## 2014-01-06 DIAGNOSIS — IMO0002 Reserved for concepts with insufficient information to code with codable children: Secondary | ICD-10-CM | POA: Diagnosis not present

## 2014-01-06 DIAGNOSIS — I10 Essential (primary) hypertension: Secondary | ICD-10-CM | POA: Diagnosis not present

## 2014-01-06 DIAGNOSIS — R32 Unspecified urinary incontinence: Secondary | ICD-10-CM | POA: Diagnosis not present

## 2014-01-06 DIAGNOSIS — T8189XA Other complications of procedures, not elsewhere classified, initial encounter: Secondary | ICD-10-CM | POA: Diagnosis not present

## 2014-01-06 DIAGNOSIS — F411 Generalized anxiety disorder: Secondary | ICD-10-CM | POA: Diagnosis not present

## 2014-01-08 DIAGNOSIS — IMO0002 Reserved for concepts with insufficient information to code with codable children: Secondary | ICD-10-CM | POA: Diagnosis not present

## 2014-01-08 DIAGNOSIS — I2510 Atherosclerotic heart disease of native coronary artery without angina pectoris: Secondary | ICD-10-CM | POA: Diagnosis not present

## 2014-01-08 DIAGNOSIS — R32 Unspecified urinary incontinence: Secondary | ICD-10-CM | POA: Diagnosis not present

## 2014-01-08 DIAGNOSIS — F411 Generalized anxiety disorder: Secondary | ICD-10-CM | POA: Diagnosis not present

## 2014-01-08 DIAGNOSIS — T8189XA Other complications of procedures, not elsewhere classified, initial encounter: Secondary | ICD-10-CM | POA: Diagnosis not present

## 2014-01-08 DIAGNOSIS — I10 Essential (primary) hypertension: Secondary | ICD-10-CM | POA: Diagnosis not present

## 2014-01-10 DIAGNOSIS — R32 Unspecified urinary incontinence: Secondary | ICD-10-CM | POA: Diagnosis not present

## 2014-01-10 DIAGNOSIS — I2510 Atherosclerotic heart disease of native coronary artery without angina pectoris: Secondary | ICD-10-CM | POA: Diagnosis not present

## 2014-01-10 DIAGNOSIS — F411 Generalized anxiety disorder: Secondary | ICD-10-CM | POA: Diagnosis not present

## 2014-01-10 DIAGNOSIS — T8189XA Other complications of procedures, not elsewhere classified, initial encounter: Secondary | ICD-10-CM | POA: Diagnosis not present

## 2014-01-10 DIAGNOSIS — IMO0002 Reserved for concepts with insufficient information to code with codable children: Secondary | ICD-10-CM | POA: Diagnosis not present

## 2014-01-10 DIAGNOSIS — I10 Essential (primary) hypertension: Secondary | ICD-10-CM | POA: Diagnosis not present

## 2014-01-12 DIAGNOSIS — L03319 Cellulitis of trunk, unspecified: Secondary | ICD-10-CM | POA: Diagnosis not present

## 2014-01-12 DIAGNOSIS — L02219 Cutaneous abscess of trunk, unspecified: Secondary | ICD-10-CM | POA: Diagnosis not present

## 2014-01-12 DIAGNOSIS — I10 Essential (primary) hypertension: Secondary | ICD-10-CM | POA: Diagnosis not present

## 2014-01-12 DIAGNOSIS — L98499 Non-pressure chronic ulcer of skin of other sites with unspecified severity: Secondary | ICD-10-CM | POA: Diagnosis not present

## 2014-01-12 DIAGNOSIS — K458 Other specified abdominal hernia without obstruction or gangrene: Secondary | ICD-10-CM | POA: Diagnosis not present

## 2014-01-12 DIAGNOSIS — I499 Cardiac arrhythmia, unspecified: Secondary | ICD-10-CM | POA: Diagnosis not present

## 2014-01-12 DIAGNOSIS — T8189XA Other complications of procedures, not elsewhere classified, initial encounter: Secondary | ICD-10-CM | POA: Diagnosis not present

## 2014-01-12 DIAGNOSIS — T859XXA Unspecified complication of internal prosthetic device, implant and graft, initial encounter: Secondary | ICD-10-CM | POA: Diagnosis not present

## 2014-01-14 DIAGNOSIS — I2510 Atherosclerotic heart disease of native coronary artery without angina pectoris: Secondary | ICD-10-CM | POA: Diagnosis not present

## 2014-01-14 DIAGNOSIS — T8189XA Other complications of procedures, not elsewhere classified, initial encounter: Secondary | ICD-10-CM | POA: Diagnosis not present

## 2014-01-14 DIAGNOSIS — IMO0002 Reserved for concepts with insufficient information to code with codable children: Secondary | ICD-10-CM | POA: Diagnosis not present

## 2014-01-14 DIAGNOSIS — R32 Unspecified urinary incontinence: Secondary | ICD-10-CM | POA: Diagnosis not present

## 2014-01-14 DIAGNOSIS — I10 Essential (primary) hypertension: Secondary | ICD-10-CM | POA: Diagnosis not present

## 2014-01-14 DIAGNOSIS — F411 Generalized anxiety disorder: Secondary | ICD-10-CM | POA: Diagnosis not present

## 2014-01-16 DIAGNOSIS — F411 Generalized anxiety disorder: Secondary | ICD-10-CM | POA: Diagnosis not present

## 2014-01-16 DIAGNOSIS — I495 Sick sinus syndrome: Secondary | ICD-10-CM | POA: Diagnosis not present

## 2014-01-16 DIAGNOSIS — R32 Unspecified urinary incontinence: Secondary | ICD-10-CM | POA: Diagnosis not present

## 2014-01-16 DIAGNOSIS — K651 Peritoneal abscess: Secondary | ICD-10-CM | POA: Diagnosis not present

## 2014-01-16 DIAGNOSIS — I10 Essential (primary) hypertension: Secondary | ICD-10-CM | POA: Diagnosis not present

## 2014-01-16 DIAGNOSIS — K219 Gastro-esophageal reflux disease without esophagitis: Secondary | ICD-10-CM | POA: Diagnosis not present

## 2014-01-16 DIAGNOSIS — E7800 Pure hypercholesterolemia, unspecified: Secondary | ICD-10-CM | POA: Diagnosis not present

## 2014-01-16 DIAGNOSIS — IMO0002 Reserved for concepts with insufficient information to code with codable children: Secondary | ICD-10-CM | POA: Diagnosis not present

## 2014-01-16 DIAGNOSIS — J45909 Unspecified asthma, uncomplicated: Secondary | ICD-10-CM | POA: Diagnosis not present

## 2014-01-16 DIAGNOSIS — K5900 Constipation, unspecified: Secondary | ICD-10-CM | POA: Diagnosis not present

## 2014-01-16 DIAGNOSIS — K5730 Diverticulosis of large intestine without perforation or abscess without bleeding: Secondary | ICD-10-CM | POA: Diagnosis not present

## 2014-01-16 DIAGNOSIS — T8189XA Other complications of procedures, not elsewhere classified, initial encounter: Secondary | ICD-10-CM | POA: Diagnosis not present

## 2014-01-16 DIAGNOSIS — I2510 Atherosclerotic heart disease of native coronary artery without angina pectoris: Secondary | ICD-10-CM | POA: Diagnosis not present

## 2014-01-18 DIAGNOSIS — T8189XA Other complications of procedures, not elsewhere classified, initial encounter: Secondary | ICD-10-CM | POA: Diagnosis not present

## 2014-01-18 DIAGNOSIS — I2510 Atherosclerotic heart disease of native coronary artery without angina pectoris: Secondary | ICD-10-CM | POA: Diagnosis not present

## 2014-01-18 DIAGNOSIS — F411 Generalized anxiety disorder: Secondary | ICD-10-CM | POA: Diagnosis not present

## 2014-01-18 DIAGNOSIS — IMO0002 Reserved for concepts with insufficient information to code with codable children: Secondary | ICD-10-CM | POA: Diagnosis not present

## 2014-01-18 DIAGNOSIS — I10 Essential (primary) hypertension: Secondary | ICD-10-CM | POA: Diagnosis not present

## 2014-01-18 DIAGNOSIS — R32 Unspecified urinary incontinence: Secondary | ICD-10-CM | POA: Diagnosis not present

## 2014-01-21 DIAGNOSIS — IMO0002 Reserved for concepts with insufficient information to code with codable children: Secondary | ICD-10-CM | POA: Diagnosis not present

## 2014-01-21 DIAGNOSIS — T8189XA Other complications of procedures, not elsewhere classified, initial encounter: Secondary | ICD-10-CM | POA: Diagnosis not present

## 2014-01-21 DIAGNOSIS — I10 Essential (primary) hypertension: Secondary | ICD-10-CM | POA: Diagnosis not present

## 2014-01-21 DIAGNOSIS — R32 Unspecified urinary incontinence: Secondary | ICD-10-CM | POA: Diagnosis not present

## 2014-01-21 DIAGNOSIS — F411 Generalized anxiety disorder: Secondary | ICD-10-CM | POA: Diagnosis not present

## 2014-01-21 DIAGNOSIS — I2510 Atherosclerotic heart disease of native coronary artery without angina pectoris: Secondary | ICD-10-CM | POA: Diagnosis not present

## 2014-01-23 DIAGNOSIS — F411 Generalized anxiety disorder: Secondary | ICD-10-CM | POA: Diagnosis not present

## 2014-01-23 DIAGNOSIS — T8189XA Other complications of procedures, not elsewhere classified, initial encounter: Secondary | ICD-10-CM | POA: Diagnosis not present

## 2014-01-23 DIAGNOSIS — R32 Unspecified urinary incontinence: Secondary | ICD-10-CM | POA: Diagnosis not present

## 2014-01-23 DIAGNOSIS — I2510 Atherosclerotic heart disease of native coronary artery without angina pectoris: Secondary | ICD-10-CM | POA: Diagnosis not present

## 2014-01-23 DIAGNOSIS — I10 Essential (primary) hypertension: Secondary | ICD-10-CM | POA: Diagnosis not present

## 2014-01-23 DIAGNOSIS — IMO0002 Reserved for concepts with insufficient information to code with codable children: Secondary | ICD-10-CM | POA: Diagnosis not present

## 2014-01-26 DIAGNOSIS — I10 Essential (primary) hypertension: Secondary | ICD-10-CM | POA: Diagnosis not present

## 2014-01-26 DIAGNOSIS — T859XXA Unspecified complication of internal prosthetic device, implant and graft, initial encounter: Secondary | ICD-10-CM | POA: Diagnosis not present

## 2014-01-26 DIAGNOSIS — L98499 Non-pressure chronic ulcer of skin of other sites with unspecified severity: Secondary | ICD-10-CM | POA: Diagnosis not present

## 2014-01-26 DIAGNOSIS — Z950 Presence of cardiac pacemaker: Secondary | ICD-10-CM | POA: Diagnosis not present

## 2014-01-26 DIAGNOSIS — M129 Arthropathy, unspecified: Secondary | ICD-10-CM | POA: Diagnosis not present

## 2014-01-26 DIAGNOSIS — Z7982 Long term (current) use of aspirin: Secondary | ICD-10-CM | POA: Diagnosis not present

## 2014-01-26 DIAGNOSIS — L02219 Cutaneous abscess of trunk, unspecified: Secondary | ICD-10-CM | POA: Diagnosis not present

## 2014-01-26 DIAGNOSIS — T8189XA Other complications of procedures, not elsewhere classified, initial encounter: Secondary | ICD-10-CM | POA: Diagnosis not present

## 2014-01-26 DIAGNOSIS — K458 Other specified abdominal hernia without obstruction or gangrene: Secondary | ICD-10-CM | POA: Diagnosis not present

## 2014-01-26 DIAGNOSIS — E669 Obesity, unspecified: Secondary | ICD-10-CM | POA: Diagnosis not present

## 2014-01-26 DIAGNOSIS — I499 Cardiac arrhythmia, unspecified: Secondary | ICD-10-CM | POA: Diagnosis not present

## 2014-01-28 DIAGNOSIS — IMO0002 Reserved for concepts with insufficient information to code with codable children: Secondary | ICD-10-CM | POA: Diagnosis not present

## 2014-01-28 DIAGNOSIS — I10 Essential (primary) hypertension: Secondary | ICD-10-CM | POA: Diagnosis not present

## 2014-01-28 DIAGNOSIS — R32 Unspecified urinary incontinence: Secondary | ICD-10-CM | POA: Diagnosis not present

## 2014-01-28 DIAGNOSIS — I2510 Atherosclerotic heart disease of native coronary artery without angina pectoris: Secondary | ICD-10-CM | POA: Diagnosis not present

## 2014-01-28 DIAGNOSIS — F411 Generalized anxiety disorder: Secondary | ICD-10-CM | POA: Diagnosis not present

## 2014-01-28 DIAGNOSIS — T8189XA Other complications of procedures, not elsewhere classified, initial encounter: Secondary | ICD-10-CM | POA: Diagnosis not present

## 2014-01-30 DIAGNOSIS — T8189XA Other complications of procedures, not elsewhere classified, initial encounter: Secondary | ICD-10-CM | POA: Diagnosis not present

## 2014-01-30 DIAGNOSIS — R32 Unspecified urinary incontinence: Secondary | ICD-10-CM | POA: Diagnosis not present

## 2014-01-30 DIAGNOSIS — I10 Essential (primary) hypertension: Secondary | ICD-10-CM | POA: Diagnosis not present

## 2014-01-30 DIAGNOSIS — F411 Generalized anxiety disorder: Secondary | ICD-10-CM | POA: Diagnosis not present

## 2014-01-30 DIAGNOSIS — IMO0002 Reserved for concepts with insufficient information to code with codable children: Secondary | ICD-10-CM | POA: Diagnosis not present

## 2014-01-30 DIAGNOSIS — I2510 Atherosclerotic heart disease of native coronary artery without angina pectoris: Secondary | ICD-10-CM | POA: Diagnosis not present

## 2014-02-02 DIAGNOSIS — R32 Unspecified urinary incontinence: Secondary | ICD-10-CM | POA: Diagnosis not present

## 2014-02-02 DIAGNOSIS — I10 Essential (primary) hypertension: Secondary | ICD-10-CM | POA: Diagnosis not present

## 2014-02-02 DIAGNOSIS — IMO0002 Reserved for concepts with insufficient information to code with codable children: Secondary | ICD-10-CM | POA: Diagnosis not present

## 2014-02-02 DIAGNOSIS — F411 Generalized anxiety disorder: Secondary | ICD-10-CM | POA: Diagnosis not present

## 2014-02-02 DIAGNOSIS — I2510 Atherosclerotic heart disease of native coronary artery without angina pectoris: Secondary | ICD-10-CM | POA: Diagnosis not present

## 2014-02-02 DIAGNOSIS — T8189XA Other complications of procedures, not elsewhere classified, initial encounter: Secondary | ICD-10-CM | POA: Diagnosis not present

## 2014-02-04 DIAGNOSIS — I2510 Atherosclerotic heart disease of native coronary artery without angina pectoris: Secondary | ICD-10-CM | POA: Diagnosis not present

## 2014-02-04 DIAGNOSIS — T8189XA Other complications of procedures, not elsewhere classified, initial encounter: Secondary | ICD-10-CM | POA: Diagnosis not present

## 2014-02-04 DIAGNOSIS — F411 Generalized anxiety disorder: Secondary | ICD-10-CM | POA: Diagnosis not present

## 2014-02-04 DIAGNOSIS — I10 Essential (primary) hypertension: Secondary | ICD-10-CM | POA: Diagnosis not present

## 2014-02-04 DIAGNOSIS — R32 Unspecified urinary incontinence: Secondary | ICD-10-CM | POA: Diagnosis not present

## 2014-02-04 DIAGNOSIS — IMO0002 Reserved for concepts with insufficient information to code with codable children: Secondary | ICD-10-CM | POA: Diagnosis not present

## 2014-02-05 DIAGNOSIS — Z881 Allergy status to other antibiotic agents status: Secondary | ICD-10-CM | POA: Diagnosis not present

## 2014-02-05 DIAGNOSIS — K449 Diaphragmatic hernia without obstruction or gangrene: Secondary | ICD-10-CM | POA: Diagnosis not present

## 2014-02-05 DIAGNOSIS — K5730 Diverticulosis of large intestine without perforation or abscess without bleeding: Secondary | ICD-10-CM | POA: Diagnosis not present

## 2014-02-05 DIAGNOSIS — R509 Fever, unspecified: Secondary | ICD-10-CM | POA: Diagnosis not present

## 2014-02-05 DIAGNOSIS — Z880 Allergy status to penicillin: Secondary | ICD-10-CM | POA: Diagnosis not present

## 2014-02-05 DIAGNOSIS — L02219 Cutaneous abscess of trunk, unspecified: Secondary | ICD-10-CM | POA: Diagnosis not present

## 2014-02-05 DIAGNOSIS — L03319 Cellulitis of trunk, unspecified: Secondary | ICD-10-CM | POA: Diagnosis not present

## 2014-02-05 DIAGNOSIS — E785 Hyperlipidemia, unspecified: Secondary | ICD-10-CM | POA: Diagnosis not present

## 2014-02-05 DIAGNOSIS — Z79899 Other long term (current) drug therapy: Secondary | ICD-10-CM | POA: Diagnosis not present

## 2014-02-05 DIAGNOSIS — I10 Essential (primary) hypertension: Secondary | ICD-10-CM | POA: Diagnosis not present

## 2014-02-05 DIAGNOSIS — R109 Unspecified abdominal pain: Secondary | ICD-10-CM | POA: Diagnosis not present

## 2014-02-06 DIAGNOSIS — I2510 Atherosclerotic heart disease of native coronary artery without angina pectoris: Secondary | ICD-10-CM | POA: Diagnosis not present

## 2014-02-06 DIAGNOSIS — L02219 Cutaneous abscess of trunk, unspecified: Secondary | ICD-10-CM | POA: Diagnosis not present

## 2014-02-06 DIAGNOSIS — F411 Generalized anxiety disorder: Secondary | ICD-10-CM | POA: Diagnosis not present

## 2014-02-06 DIAGNOSIS — R32 Unspecified urinary incontinence: Secondary | ICD-10-CM | POA: Diagnosis not present

## 2014-02-06 DIAGNOSIS — IMO0002 Reserved for concepts with insufficient information to code with codable children: Secondary | ICD-10-CM | POA: Diagnosis not present

## 2014-02-06 DIAGNOSIS — I10 Essential (primary) hypertension: Secondary | ICD-10-CM | POA: Diagnosis not present

## 2014-02-06 DIAGNOSIS — T8189XA Other complications of procedures, not elsewhere classified, initial encounter: Secondary | ICD-10-CM | POA: Diagnosis not present

## 2014-02-09 DIAGNOSIS — K458 Other specified abdominal hernia without obstruction or gangrene: Secondary | ICD-10-CM | POA: Diagnosis not present

## 2014-02-09 DIAGNOSIS — T859XXA Unspecified complication of internal prosthetic device, implant and graft, initial encounter: Secondary | ICD-10-CM | POA: Diagnosis not present

## 2014-02-09 DIAGNOSIS — L02219 Cutaneous abscess of trunk, unspecified: Secondary | ICD-10-CM | POA: Diagnosis not present

## 2014-02-09 DIAGNOSIS — L98499 Non-pressure chronic ulcer of skin of other sites with unspecified severity: Secondary | ICD-10-CM | POA: Diagnosis not present

## 2014-02-09 DIAGNOSIS — I499 Cardiac arrhythmia, unspecified: Secondary | ICD-10-CM | POA: Diagnosis not present

## 2014-02-09 DIAGNOSIS — I10 Essential (primary) hypertension: Secondary | ICD-10-CM | POA: Diagnosis not present

## 2014-02-11 DIAGNOSIS — F411 Generalized anxiety disorder: Secondary | ICD-10-CM | POA: Diagnosis not present

## 2014-02-11 DIAGNOSIS — R32 Unspecified urinary incontinence: Secondary | ICD-10-CM | POA: Diagnosis not present

## 2014-02-11 DIAGNOSIS — T8189XA Other complications of procedures, not elsewhere classified, initial encounter: Secondary | ICD-10-CM | POA: Diagnosis not present

## 2014-02-11 DIAGNOSIS — IMO0002 Reserved for concepts with insufficient information to code with codable children: Secondary | ICD-10-CM | POA: Diagnosis not present

## 2014-02-11 DIAGNOSIS — I10 Essential (primary) hypertension: Secondary | ICD-10-CM | POA: Diagnosis not present

## 2014-02-11 DIAGNOSIS — I2510 Atherosclerotic heart disease of native coronary artery without angina pectoris: Secondary | ICD-10-CM | POA: Diagnosis not present

## 2014-02-13 DIAGNOSIS — K219 Gastro-esophageal reflux disease without esophagitis: Secondary | ICD-10-CM | POA: Diagnosis not present

## 2014-02-13 DIAGNOSIS — I10 Essential (primary) hypertension: Secondary | ICD-10-CM | POA: Diagnosis not present

## 2014-02-13 DIAGNOSIS — E7800 Pure hypercholesterolemia, unspecified: Secondary | ICD-10-CM | POA: Diagnosis not present

## 2014-02-13 DIAGNOSIS — R32 Unspecified urinary incontinence: Secondary | ICD-10-CM | POA: Diagnosis not present

## 2014-02-13 DIAGNOSIS — K5900 Constipation, unspecified: Secondary | ICD-10-CM | POA: Diagnosis not present

## 2014-02-13 DIAGNOSIS — F411 Generalized anxiety disorder: Secondary | ICD-10-CM | POA: Diagnosis not present

## 2014-02-13 DIAGNOSIS — J45909 Unspecified asthma, uncomplicated: Secondary | ICD-10-CM | POA: Diagnosis not present

## 2014-02-13 DIAGNOSIS — IMO0002 Reserved for concepts with insufficient information to code with codable children: Secondary | ICD-10-CM | POA: Diagnosis not present

## 2014-02-13 DIAGNOSIS — I2510 Atherosclerotic heart disease of native coronary artery without angina pectoris: Secondary | ICD-10-CM | POA: Diagnosis not present

## 2014-02-13 DIAGNOSIS — T8189XA Other complications of procedures, not elsewhere classified, initial encounter: Secondary | ICD-10-CM | POA: Diagnosis not present

## 2014-02-13 DIAGNOSIS — K651 Peritoneal abscess: Secondary | ICD-10-CM | POA: Diagnosis not present

## 2014-02-13 DIAGNOSIS — K5730 Diverticulosis of large intestine without perforation or abscess without bleeding: Secondary | ICD-10-CM | POA: Diagnosis not present

## 2014-02-13 DIAGNOSIS — I495 Sick sinus syndrome: Secondary | ICD-10-CM | POA: Diagnosis not present

## 2014-02-16 DIAGNOSIS — I10 Essential (primary) hypertension: Secondary | ICD-10-CM | POA: Diagnosis not present

## 2014-02-16 DIAGNOSIS — F411 Generalized anxiety disorder: Secondary | ICD-10-CM | POA: Diagnosis not present

## 2014-02-16 DIAGNOSIS — IMO0002 Reserved for concepts with insufficient information to code with codable children: Secondary | ICD-10-CM | POA: Diagnosis not present

## 2014-02-16 DIAGNOSIS — I2510 Atherosclerotic heart disease of native coronary artery without angina pectoris: Secondary | ICD-10-CM | POA: Diagnosis not present

## 2014-02-16 DIAGNOSIS — R32 Unspecified urinary incontinence: Secondary | ICD-10-CM | POA: Diagnosis not present

## 2014-02-16 DIAGNOSIS — T8189XA Other complications of procedures, not elsewhere classified, initial encounter: Secondary | ICD-10-CM | POA: Diagnosis not present

## 2014-02-18 DIAGNOSIS — T8189XA Other complications of procedures, not elsewhere classified, initial encounter: Secondary | ICD-10-CM | POA: Diagnosis not present

## 2014-02-18 DIAGNOSIS — I10 Essential (primary) hypertension: Secondary | ICD-10-CM | POA: Diagnosis not present

## 2014-02-18 DIAGNOSIS — I2510 Atherosclerotic heart disease of native coronary artery without angina pectoris: Secondary | ICD-10-CM | POA: Diagnosis not present

## 2014-02-18 DIAGNOSIS — F411 Generalized anxiety disorder: Secondary | ICD-10-CM | POA: Diagnosis not present

## 2014-02-18 DIAGNOSIS — IMO0002 Reserved for concepts with insufficient information to code with codable children: Secondary | ICD-10-CM | POA: Diagnosis not present

## 2014-02-18 DIAGNOSIS — R32 Unspecified urinary incontinence: Secondary | ICD-10-CM | POA: Diagnosis not present

## 2014-02-20 DIAGNOSIS — R32 Unspecified urinary incontinence: Secondary | ICD-10-CM | POA: Diagnosis not present

## 2014-02-20 DIAGNOSIS — I10 Essential (primary) hypertension: Secondary | ICD-10-CM | POA: Diagnosis not present

## 2014-02-20 DIAGNOSIS — F411 Generalized anxiety disorder: Secondary | ICD-10-CM | POA: Diagnosis not present

## 2014-02-20 DIAGNOSIS — T8189XA Other complications of procedures, not elsewhere classified, initial encounter: Secondary | ICD-10-CM | POA: Diagnosis not present

## 2014-02-20 DIAGNOSIS — I2510 Atherosclerotic heart disease of native coronary artery without angina pectoris: Secondary | ICD-10-CM | POA: Diagnosis not present

## 2014-02-20 DIAGNOSIS — IMO0002 Reserved for concepts with insufficient information to code with codable children: Secondary | ICD-10-CM | POA: Diagnosis not present

## 2014-02-23 DIAGNOSIS — L98499 Non-pressure chronic ulcer of skin of other sites with unspecified severity: Secondary | ICD-10-CM | POA: Diagnosis not present

## 2014-02-23 DIAGNOSIS — I499 Cardiac arrhythmia, unspecified: Secondary | ICD-10-CM | POA: Diagnosis not present

## 2014-02-23 DIAGNOSIS — T8189XA Other complications of procedures, not elsewhere classified, initial encounter: Secondary | ICD-10-CM | POA: Diagnosis not present

## 2014-02-23 DIAGNOSIS — L02219 Cutaneous abscess of trunk, unspecified: Secondary | ICD-10-CM | POA: Diagnosis not present

## 2014-02-23 DIAGNOSIS — T859XXA Unspecified complication of internal prosthetic device, implant and graft, initial encounter: Secondary | ICD-10-CM | POA: Diagnosis not present

## 2014-02-23 DIAGNOSIS — I10 Essential (primary) hypertension: Secondary | ICD-10-CM | POA: Diagnosis not present

## 2014-02-23 DIAGNOSIS — K458 Other specified abdominal hernia without obstruction or gangrene: Secondary | ICD-10-CM | POA: Diagnosis not present

## 2014-02-25 DIAGNOSIS — R32 Unspecified urinary incontinence: Secondary | ICD-10-CM | POA: Diagnosis not present

## 2014-02-25 DIAGNOSIS — S8290XS Unspecified fracture of unspecified lower leg, sequela: Secondary | ICD-10-CM | POA: Diagnosis not present

## 2014-02-25 DIAGNOSIS — IMO0002 Reserved for concepts with insufficient information to code with codable children: Secondary | ICD-10-CM | POA: Diagnosis not present

## 2014-02-25 DIAGNOSIS — I10 Essential (primary) hypertension: Secondary | ICD-10-CM | POA: Diagnosis not present

## 2014-02-25 DIAGNOSIS — T8189XA Other complications of procedures, not elsewhere classified, initial encounter: Secondary | ICD-10-CM | POA: Diagnosis not present

## 2014-02-25 DIAGNOSIS — I2510 Atherosclerotic heart disease of native coronary artery without angina pectoris: Secondary | ICD-10-CM | POA: Diagnosis not present

## 2014-02-25 DIAGNOSIS — F411 Generalized anxiety disorder: Secondary | ICD-10-CM | POA: Diagnosis not present

## 2014-02-27 DIAGNOSIS — T8189XA Other complications of procedures, not elsewhere classified, initial encounter: Secondary | ICD-10-CM | POA: Diagnosis not present

## 2014-02-27 DIAGNOSIS — I10 Essential (primary) hypertension: Secondary | ICD-10-CM | POA: Diagnosis not present

## 2014-02-27 DIAGNOSIS — F411 Generalized anxiety disorder: Secondary | ICD-10-CM | POA: Diagnosis not present

## 2014-02-27 DIAGNOSIS — IMO0002 Reserved for concepts with insufficient information to code with codable children: Secondary | ICD-10-CM | POA: Diagnosis not present

## 2014-02-27 DIAGNOSIS — R32 Unspecified urinary incontinence: Secondary | ICD-10-CM | POA: Diagnosis not present

## 2014-02-27 DIAGNOSIS — I2510 Atherosclerotic heart disease of native coronary artery without angina pectoris: Secondary | ICD-10-CM | POA: Diagnosis not present

## 2014-03-09 DIAGNOSIS — Z950 Presence of cardiac pacemaker: Secondary | ICD-10-CM | POA: Diagnosis not present

## 2014-03-09 DIAGNOSIS — M129 Arthropathy, unspecified: Secondary | ICD-10-CM | POA: Diagnosis not present

## 2014-03-09 DIAGNOSIS — I10 Essential (primary) hypertension: Secondary | ICD-10-CM | POA: Diagnosis not present

## 2014-03-09 DIAGNOSIS — L98499 Non-pressure chronic ulcer of skin of other sites with unspecified severity: Secondary | ICD-10-CM | POA: Diagnosis not present

## 2014-03-09 DIAGNOSIS — I499 Cardiac arrhythmia, unspecified: Secondary | ICD-10-CM | POA: Diagnosis not present

## 2014-03-09 DIAGNOSIS — L02219 Cutaneous abscess of trunk, unspecified: Secondary | ICD-10-CM | POA: Diagnosis not present

## 2014-03-09 DIAGNOSIS — T859XXA Unspecified complication of internal prosthetic device, implant and graft, initial encounter: Secondary | ICD-10-CM | POA: Diagnosis not present

## 2014-03-09 DIAGNOSIS — K458 Other specified abdominal hernia without obstruction or gangrene: Secondary | ICD-10-CM | POA: Diagnosis not present

## 2014-03-09 DIAGNOSIS — Z7982 Long term (current) use of aspirin: Secondary | ICD-10-CM | POA: Diagnosis not present

## 2014-03-09 DIAGNOSIS — T8189XA Other complications of procedures, not elsewhere classified, initial encounter: Secondary | ICD-10-CM | POA: Diagnosis not present

## 2014-03-09 DIAGNOSIS — E669 Obesity, unspecified: Secondary | ICD-10-CM | POA: Diagnosis not present

## 2014-03-25 DIAGNOSIS — T8189XA Other complications of procedures, not elsewhere classified, initial encounter: Secondary | ICD-10-CM | POA: Diagnosis not present

## 2014-03-25 DIAGNOSIS — R1033 Periumbilical pain: Secondary | ICD-10-CM | POA: Diagnosis not present

## 2014-03-25 DIAGNOSIS — L02818 Cutaneous abscess of other sites: Secondary | ICD-10-CM | POA: Diagnosis not present

## 2014-03-25 DIAGNOSIS — K432 Incisional hernia without obstruction or gangrene: Secondary | ICD-10-CM | POA: Diagnosis not present

## 2014-03-25 DIAGNOSIS — R1084 Generalized abdominal pain: Secondary | ICD-10-CM | POA: Diagnosis not present

## 2014-04-01 DIAGNOSIS — E119 Type 2 diabetes mellitus without complications: Secondary | ICD-10-CM | POA: Diagnosis not present

## 2014-04-01 DIAGNOSIS — E039 Hypothyroidism, unspecified: Secondary | ICD-10-CM | POA: Diagnosis not present

## 2014-04-01 DIAGNOSIS — E7800 Pure hypercholesterolemia, unspecified: Secondary | ICD-10-CM | POA: Diagnosis not present

## 2014-04-01 DIAGNOSIS — I4891 Unspecified atrial fibrillation: Secondary | ICD-10-CM | POA: Diagnosis not present

## 2014-04-01 DIAGNOSIS — D518 Other vitamin B12 deficiency anemias: Secondary | ICD-10-CM | POA: Diagnosis not present

## 2014-04-01 DIAGNOSIS — I2510 Atherosclerotic heart disease of native coronary artery without angina pectoris: Secondary | ICD-10-CM | POA: Diagnosis not present

## 2014-04-01 DIAGNOSIS — E669 Obesity, unspecified: Secondary | ICD-10-CM | POA: Diagnosis not present

## 2014-04-01 DIAGNOSIS — Z01810 Encounter for preprocedural cardiovascular examination: Secondary | ICD-10-CM | POA: Diagnosis not present

## 2014-04-08 DIAGNOSIS — I4891 Unspecified atrial fibrillation: Secondary | ICD-10-CM | POA: Diagnosis not present

## 2014-04-14 DIAGNOSIS — M6281 Muscle weakness (generalized): Secondary | ICD-10-CM | POA: Diagnosis not present

## 2014-04-14 DIAGNOSIS — I2510 Atherosclerotic heart disease of native coronary artery without angina pectoris: Secondary | ICD-10-CM | POA: Diagnosis not present

## 2014-04-14 DIAGNOSIS — I4891 Unspecified atrial fibrillation: Secondary | ICD-10-CM | POA: Diagnosis not present

## 2014-04-14 DIAGNOSIS — E785 Hyperlipidemia, unspecified: Secondary | ICD-10-CM | POA: Diagnosis not present

## 2014-04-14 DIAGNOSIS — F0390 Unspecified dementia without behavioral disturbance: Secondary | ICD-10-CM | POA: Diagnosis not present

## 2014-04-15 DIAGNOSIS — F0390 Unspecified dementia without behavioral disturbance: Secondary | ICD-10-CM | POA: Diagnosis not present

## 2014-04-15 DIAGNOSIS — I2510 Atherosclerotic heart disease of native coronary artery without angina pectoris: Secondary | ICD-10-CM | POA: Diagnosis not present

## 2014-04-15 DIAGNOSIS — E785 Hyperlipidemia, unspecified: Secondary | ICD-10-CM | POA: Diagnosis not present

## 2014-04-15 DIAGNOSIS — I4891 Unspecified atrial fibrillation: Secondary | ICD-10-CM | POA: Diagnosis not present

## 2014-04-15 DIAGNOSIS — M6281 Muscle weakness (generalized): Secondary | ICD-10-CM | POA: Diagnosis not present

## 2014-04-17 DIAGNOSIS — M6281 Muscle weakness (generalized): Secondary | ICD-10-CM | POA: Diagnosis not present

## 2014-04-17 DIAGNOSIS — E785 Hyperlipidemia, unspecified: Secondary | ICD-10-CM | POA: Diagnosis not present

## 2014-04-17 DIAGNOSIS — I2510 Atherosclerotic heart disease of native coronary artery without angina pectoris: Secondary | ICD-10-CM | POA: Diagnosis not present

## 2014-04-17 DIAGNOSIS — F0390 Unspecified dementia without behavioral disturbance: Secondary | ICD-10-CM | POA: Diagnosis not present

## 2014-04-17 DIAGNOSIS — I4891 Unspecified atrial fibrillation: Secondary | ICD-10-CM | POA: Diagnosis not present

## 2014-04-21 DIAGNOSIS — E785 Hyperlipidemia, unspecified: Secondary | ICD-10-CM | POA: Diagnosis not present

## 2014-04-21 DIAGNOSIS — F0390 Unspecified dementia without behavioral disturbance: Secondary | ICD-10-CM | POA: Diagnosis not present

## 2014-04-21 DIAGNOSIS — M6281 Muscle weakness (generalized): Secondary | ICD-10-CM | POA: Diagnosis not present

## 2014-04-21 DIAGNOSIS — I2510 Atherosclerotic heart disease of native coronary artery without angina pectoris: Secondary | ICD-10-CM | POA: Diagnosis not present

## 2014-04-21 DIAGNOSIS — I4891 Unspecified atrial fibrillation: Secondary | ICD-10-CM | POA: Diagnosis not present

## 2014-04-22 DIAGNOSIS — I2510 Atherosclerotic heart disease of native coronary artery without angina pectoris: Secondary | ICD-10-CM | POA: Diagnosis not present

## 2014-04-23 DIAGNOSIS — I4891 Unspecified atrial fibrillation: Secondary | ICD-10-CM | POA: Diagnosis not present

## 2014-04-23 DIAGNOSIS — E785 Hyperlipidemia, unspecified: Secondary | ICD-10-CM | POA: Diagnosis not present

## 2014-04-23 DIAGNOSIS — F0390 Unspecified dementia without behavioral disturbance: Secondary | ICD-10-CM | POA: Diagnosis not present

## 2014-04-23 DIAGNOSIS — M6281 Muscle weakness (generalized): Secondary | ICD-10-CM | POA: Diagnosis not present

## 2014-04-23 DIAGNOSIS — I2510 Atherosclerotic heart disease of native coronary artery without angina pectoris: Secondary | ICD-10-CM | POA: Diagnosis not present

## 2014-04-29 DIAGNOSIS — Z950 Presence of cardiac pacemaker: Secondary | ICD-10-CM | POA: Diagnosis not present

## 2014-04-29 DIAGNOSIS — I495 Sick sinus syndrome: Secondary | ICD-10-CM | POA: Diagnosis not present

## 2014-04-29 DIAGNOSIS — I10 Essential (primary) hypertension: Secondary | ICD-10-CM | POA: Diagnosis not present

## 2014-04-29 DIAGNOSIS — Z01810 Encounter for preprocedural cardiovascular examination: Secondary | ICD-10-CM | POA: Diagnosis not present

## 2014-04-29 DIAGNOSIS — F0390 Unspecified dementia without behavioral disturbance: Secondary | ICD-10-CM | POA: Diagnosis not present

## 2014-04-29 DIAGNOSIS — E785 Hyperlipidemia, unspecified: Secondary | ICD-10-CM | POA: Diagnosis not present

## 2014-04-29 DIAGNOSIS — I4891 Unspecified atrial fibrillation: Secondary | ICD-10-CM | POA: Diagnosis not present

## 2014-04-29 DIAGNOSIS — M6281 Muscle weakness (generalized): Secondary | ICD-10-CM | POA: Diagnosis not present

## 2014-04-29 DIAGNOSIS — I2510 Atherosclerotic heart disease of native coronary artery without angina pectoris: Secondary | ICD-10-CM | POA: Diagnosis not present

## 2014-04-30 DIAGNOSIS — I4891 Unspecified atrial fibrillation: Secondary | ICD-10-CM | POA: Diagnosis not present

## 2014-04-30 DIAGNOSIS — F0390 Unspecified dementia without behavioral disturbance: Secondary | ICD-10-CM | POA: Diagnosis not present

## 2014-04-30 DIAGNOSIS — M6281 Muscle weakness (generalized): Secondary | ICD-10-CM | POA: Diagnosis not present

## 2014-04-30 DIAGNOSIS — E785 Hyperlipidemia, unspecified: Secondary | ICD-10-CM | POA: Diagnosis not present

## 2014-04-30 DIAGNOSIS — I2510 Atherosclerotic heart disease of native coronary artery without angina pectoris: Secondary | ICD-10-CM | POA: Diagnosis not present

## 2014-05-01 DIAGNOSIS — I4891 Unspecified atrial fibrillation: Secondary | ICD-10-CM | POA: Diagnosis not present

## 2014-05-01 DIAGNOSIS — F0390 Unspecified dementia without behavioral disturbance: Secondary | ICD-10-CM | POA: Diagnosis not present

## 2014-05-01 DIAGNOSIS — M6281 Muscle weakness (generalized): Secondary | ICD-10-CM | POA: Diagnosis not present

## 2014-05-01 DIAGNOSIS — E785 Hyperlipidemia, unspecified: Secondary | ICD-10-CM | POA: Diagnosis not present

## 2014-05-01 DIAGNOSIS — I2510 Atherosclerotic heart disease of native coronary artery without angina pectoris: Secondary | ICD-10-CM | POA: Diagnosis not present

## 2014-05-06 DIAGNOSIS — F0390 Unspecified dementia without behavioral disturbance: Secondary | ICD-10-CM | POA: Diagnosis not present

## 2014-05-06 DIAGNOSIS — E785 Hyperlipidemia, unspecified: Secondary | ICD-10-CM | POA: Diagnosis not present

## 2014-05-06 DIAGNOSIS — M6281 Muscle weakness (generalized): Secondary | ICD-10-CM | POA: Diagnosis not present

## 2014-05-06 DIAGNOSIS — I2510 Atherosclerotic heart disease of native coronary artery without angina pectoris: Secondary | ICD-10-CM | POA: Diagnosis not present

## 2014-05-06 DIAGNOSIS — I4891 Unspecified atrial fibrillation: Secondary | ICD-10-CM | POA: Diagnosis not present

## 2014-05-07 DIAGNOSIS — F0390 Unspecified dementia without behavioral disturbance: Secondary | ICD-10-CM | POA: Diagnosis not present

## 2014-05-07 DIAGNOSIS — I4891 Unspecified atrial fibrillation: Secondary | ICD-10-CM | POA: Diagnosis not present

## 2014-05-07 DIAGNOSIS — E785 Hyperlipidemia, unspecified: Secondary | ICD-10-CM | POA: Diagnosis not present

## 2014-05-07 DIAGNOSIS — I2510 Atherosclerotic heart disease of native coronary artery without angina pectoris: Secondary | ICD-10-CM | POA: Diagnosis not present

## 2014-05-07 DIAGNOSIS — M6281 Muscle weakness (generalized): Secondary | ICD-10-CM | POA: Diagnosis not present

## 2014-05-08 DIAGNOSIS — I4891 Unspecified atrial fibrillation: Secondary | ICD-10-CM | POA: Diagnosis not present

## 2014-05-08 DIAGNOSIS — I2510 Atherosclerotic heart disease of native coronary artery without angina pectoris: Secondary | ICD-10-CM | POA: Diagnosis not present

## 2014-05-08 DIAGNOSIS — F0390 Unspecified dementia without behavioral disturbance: Secondary | ICD-10-CM | POA: Diagnosis not present

## 2014-05-08 DIAGNOSIS — M6281 Muscle weakness (generalized): Secondary | ICD-10-CM | POA: Diagnosis not present

## 2014-05-08 DIAGNOSIS — E785 Hyperlipidemia, unspecified: Secondary | ICD-10-CM | POA: Diagnosis not present

## 2014-05-15 DIAGNOSIS — E785 Hyperlipidemia, unspecified: Secondary | ICD-10-CM | POA: Diagnosis not present

## 2014-05-15 DIAGNOSIS — F0390 Unspecified dementia without behavioral disturbance: Secondary | ICD-10-CM | POA: Diagnosis not present

## 2014-05-15 DIAGNOSIS — M6281 Muscle weakness (generalized): Secondary | ICD-10-CM | POA: Diagnosis not present

## 2014-05-15 DIAGNOSIS — I4891 Unspecified atrial fibrillation: Secondary | ICD-10-CM | POA: Diagnosis not present

## 2014-05-15 DIAGNOSIS — I2510 Atherosclerotic heart disease of native coronary artery without angina pectoris: Secondary | ICD-10-CM | POA: Diagnosis not present

## 2014-05-20 DIAGNOSIS — M6281 Muscle weakness (generalized): Secondary | ICD-10-CM | POA: Diagnosis not present

## 2014-05-20 DIAGNOSIS — I4891 Unspecified atrial fibrillation: Secondary | ICD-10-CM | POA: Diagnosis not present

## 2014-05-20 DIAGNOSIS — I2510 Atherosclerotic heart disease of native coronary artery without angina pectoris: Secondary | ICD-10-CM | POA: Diagnosis not present

## 2014-05-20 DIAGNOSIS — E785 Hyperlipidemia, unspecified: Secondary | ICD-10-CM | POA: Diagnosis not present

## 2014-05-20 DIAGNOSIS — F0390 Unspecified dementia without behavioral disturbance: Secondary | ICD-10-CM | POA: Diagnosis not present

## 2014-06-17 DIAGNOSIS — K432 Incisional hernia without obstruction or gangrene: Secondary | ICD-10-CM | POA: Diagnosis not present

## 2014-06-17 DIAGNOSIS — R1084 Generalized abdominal pain: Secondary | ICD-10-CM | POA: Diagnosis not present

## 2014-06-17 DIAGNOSIS — T8189XA Other complications of procedures, not elsewhere classified, initial encounter: Secondary | ICD-10-CM | POA: Diagnosis not present

## 2014-06-17 DIAGNOSIS — L03818 Cellulitis of other sites: Secondary | ICD-10-CM | POA: Diagnosis not present

## 2014-06-17 DIAGNOSIS — L02818 Cutaneous abscess of other sites: Secondary | ICD-10-CM | POA: Diagnosis not present

## 2014-06-17 DIAGNOSIS — R1033 Periumbilical pain: Secondary | ICD-10-CM | POA: Diagnosis not present

## 2014-06-19 DIAGNOSIS — E119 Type 2 diabetes mellitus without complications: Secondary | ICD-10-CM | POA: Diagnosis not present

## 2014-06-22 DIAGNOSIS — E785 Hyperlipidemia, unspecified: Secondary | ICD-10-CM | POA: Diagnosis not present

## 2014-07-22 DIAGNOSIS — T8189XA Other complications of procedures, not elsewhere classified, initial encounter: Secondary | ICD-10-CM | POA: Diagnosis not present

## 2014-07-22 DIAGNOSIS — R1033 Periumbilical pain: Secondary | ICD-10-CM | POA: Diagnosis not present

## 2014-07-22 DIAGNOSIS — L03818 Cellulitis of other sites: Secondary | ICD-10-CM | POA: Diagnosis not present

## 2014-07-22 DIAGNOSIS — L02818 Cutaneous abscess of other sites: Secondary | ICD-10-CM | POA: Diagnosis not present

## 2014-07-31 DIAGNOSIS — Z9089 Acquired absence of other organs: Secondary | ICD-10-CM | POA: Diagnosis not present

## 2014-07-31 DIAGNOSIS — K439 Ventral hernia without obstruction or gangrene: Secondary | ICD-10-CM | POA: Diagnosis not present

## 2014-07-31 DIAGNOSIS — K5730 Diverticulosis of large intestine without perforation or abscess without bleeding: Secondary | ICD-10-CM | POA: Diagnosis not present

## 2014-07-31 DIAGNOSIS — K449 Diaphragmatic hernia without obstruction or gangrene: Secondary | ICD-10-CM | POA: Diagnosis not present

## 2014-09-18 DIAGNOSIS — I10 Essential (primary) hypertension: Secondary | ICD-10-CM | POA: Diagnosis not present

## 2014-09-18 DIAGNOSIS — E782 Mixed hyperlipidemia: Secondary | ICD-10-CM | POA: Diagnosis not present

## 2014-09-18 DIAGNOSIS — E1100 Type 2 diabetes mellitus with hyperosmolarity without nonketotic hyperglycemic-hyperosmolar coma (NKHHC): Secondary | ICD-10-CM | POA: Diagnosis not present

## 2014-09-18 DIAGNOSIS — I25119 Atherosclerotic heart disease of native coronary artery with unspecified angina pectoris: Secondary | ICD-10-CM | POA: Diagnosis not present

## 2014-09-18 DIAGNOSIS — Z853 Personal history of malignant neoplasm of breast: Secondary | ICD-10-CM | POA: Diagnosis not present

## 2014-09-18 DIAGNOSIS — E669 Obesity, unspecified: Secondary | ICD-10-CM | POA: Diagnosis not present

## 2014-09-18 DIAGNOSIS — Z23 Encounter for immunization: Secondary | ICD-10-CM | POA: Diagnosis not present

## 2014-09-18 DIAGNOSIS — Z833 Family history of diabetes mellitus: Secondary | ICD-10-CM | POA: Diagnosis not present

## 2014-10-19 ENCOUNTER — Inpatient Hospital Stay (HOSPITAL_COMMUNITY)
Admission: EM | Admit: 2014-10-19 | Discharge: 2014-10-23 | DRG: 603 | Disposition: A | Discharge status: Home Health | Payer: Medicare Other | Attending: Family Medicine | Admitting: Family Medicine

## 2014-10-19 ENCOUNTER — Emergency Department (HOSPITAL_COMMUNITY): Payer: Medicare Other

## 2014-10-19 ENCOUNTER — Encounter (HOSPITAL_COMMUNITY): Payer: Self-pay | Admitting: Emergency Medicine

## 2014-10-19 DIAGNOSIS — L02211 Cutaneous abscess of abdominal wall: Principal | ICD-10-CM

## 2014-10-19 DIAGNOSIS — R079 Chest pain, unspecified: Secondary | ICD-10-CM | POA: Diagnosis not present

## 2014-10-19 DIAGNOSIS — G4730 Sleep apnea, unspecified: Secondary | ICD-10-CM | POA: Diagnosis present

## 2014-10-19 DIAGNOSIS — I4891 Unspecified atrial fibrillation: Secondary | ICD-10-CM | POA: Diagnosis not present

## 2014-10-19 DIAGNOSIS — I959 Hypotension, unspecified: Secondary | ICD-10-CM

## 2014-10-19 DIAGNOSIS — Z888 Allergy status to other drugs, medicaments and biological substances status: Secondary | ICD-10-CM | POA: Diagnosis not present

## 2014-10-19 DIAGNOSIS — L03311 Cellulitis of abdominal wall: Secondary | ICD-10-CM | POA: Diagnosis present

## 2014-10-19 DIAGNOSIS — Z885 Allergy status to narcotic agent status: Secondary | ICD-10-CM

## 2014-10-19 DIAGNOSIS — D638 Anemia in other chronic diseases classified elsewhere: Secondary | ICD-10-CM | POA: Diagnosis present

## 2014-10-19 DIAGNOSIS — I2510 Atherosclerotic heart disease of native coronary artery without angina pectoris: Secondary | ICD-10-CM | POA: Diagnosis present

## 2014-10-19 DIAGNOSIS — K5900 Constipation, unspecified: Secondary | ICD-10-CM | POA: Diagnosis present

## 2014-10-19 DIAGNOSIS — K449 Diaphragmatic hernia without obstruction or gangrene: Secondary | ICD-10-CM | POA: Diagnosis not present

## 2014-10-19 DIAGNOSIS — Z91040 Latex allergy status: Secondary | ICD-10-CM | POA: Diagnosis not present

## 2014-10-19 DIAGNOSIS — J45909 Unspecified asthma, uncomplicated: Secondary | ICD-10-CM | POA: Diagnosis present

## 2014-10-19 DIAGNOSIS — Z955 Presence of coronary angioplasty implant and graft: Secondary | ICD-10-CM | POA: Diagnosis not present

## 2014-10-19 DIAGNOSIS — Z880 Allergy status to penicillin: Secondary | ICD-10-CM

## 2014-10-19 DIAGNOSIS — I10 Essential (primary) hypertension: Secondary | ICD-10-CM | POA: Diagnosis present

## 2014-10-19 DIAGNOSIS — Z87891 Personal history of nicotine dependence: Secondary | ICD-10-CM | POA: Diagnosis not present

## 2014-10-19 DIAGNOSIS — Z96653 Presence of artificial knee joint, bilateral: Secondary | ICD-10-CM | POA: Diagnosis present

## 2014-10-19 DIAGNOSIS — R1084 Generalized abdominal pain: Secondary | ICD-10-CM | POA: Diagnosis not present

## 2014-10-19 DIAGNOSIS — Z950 Presence of cardiac pacemaker: Secondary | ICD-10-CM | POA: Diagnosis not present

## 2014-10-19 DIAGNOSIS — R109 Unspecified abdominal pain: Secondary | ICD-10-CM

## 2014-10-19 DIAGNOSIS — R0602 Shortness of breath: Secondary | ICD-10-CM | POA: Diagnosis not present

## 2014-10-19 DIAGNOSIS — E785 Hyperlipidemia, unspecified: Secondary | ICD-10-CM | POA: Diagnosis present

## 2014-10-19 DIAGNOSIS — K5730 Diverticulosis of large intestine without perforation or abscess without bleeding: Secondary | ICD-10-CM | POA: Diagnosis not present

## 2014-10-19 DIAGNOSIS — E876 Hypokalemia: Secondary | ICD-10-CM | POA: Diagnosis present

## 2014-10-19 DIAGNOSIS — E780 Pure hypercholesterolemia: Secondary | ICD-10-CM | POA: Diagnosis present

## 2014-10-19 DIAGNOSIS — E860 Dehydration: Secondary | ICD-10-CM | POA: Diagnosis present

## 2014-10-19 DIAGNOSIS — Z6828 Body mass index (BMI) 28.0-28.9, adult: Secondary | ICD-10-CM

## 2014-10-19 DIAGNOSIS — K429 Umbilical hernia without obstruction or gangrene: Secondary | ICD-10-CM | POA: Diagnosis not present

## 2014-10-19 DIAGNOSIS — K432 Incisional hernia without obstruction or gangrene: Secondary | ICD-10-CM | POA: Diagnosis present

## 2014-10-19 DIAGNOSIS — Z882 Allergy status to sulfonamides status: Secondary | ICD-10-CM

## 2014-10-19 DIAGNOSIS — R001 Bradycardia, unspecified: Secondary | ICD-10-CM | POA: Diagnosis present

## 2014-10-19 DIAGNOSIS — Z881 Allergy status to other antibiotic agents status: Secondary | ICD-10-CM | POA: Diagnosis not present

## 2014-10-19 DIAGNOSIS — Z0389 Encounter for observation for other suspected diseases and conditions ruled out: Secondary | ICD-10-CM | POA: Diagnosis not present

## 2014-10-19 DIAGNOSIS — I951 Orthostatic hypotension: Secondary | ICD-10-CM | POA: Diagnosis not present

## 2014-10-19 LAB — I-STAT TROPONIN, ED: Troponin i, poc: 0.02 ng/mL (ref 0.00–0.08)

## 2014-10-19 LAB — COMPREHENSIVE METABOLIC PANEL
ALK PHOS: 137 U/L — AB (ref 39–117)
ALT: 23 U/L (ref 0–35)
ANION GAP: 16 — AB (ref 5–15)
AST: 44 U/L — ABNORMAL HIGH (ref 0–37)
Albumin: 2.7 g/dL — ABNORMAL LOW (ref 3.5–5.2)
BUN: 21 mg/dL (ref 6–23)
CALCIUM: 9.1 mg/dL (ref 8.4–10.5)
CO2: 24 mEq/L (ref 19–32)
CREATININE: 0.97 mg/dL (ref 0.50–1.10)
Chloride: 93 mEq/L — ABNORMAL LOW (ref 96–112)
GFR calc non Af Amer: 53 mL/min — ABNORMAL LOW (ref >90)
GFR, EST AFRICAN AMERICAN: 61 mL/min — AB (ref >90)
GLUCOSE: 112 mg/dL — AB (ref 70–99)
POTASSIUM: 3.4 meq/L — AB (ref 3.7–5.3)
Sodium: 133 mEq/L — ABNORMAL LOW (ref 137–147)
TOTAL PROTEIN: 7.4 g/dL (ref 6.0–8.3)
Total Bilirubin: 0.5 mg/dL (ref 0.3–1.2)

## 2014-10-19 LAB — CBC WITH DIFFERENTIAL/PLATELET
BASOS PCT: 0 % (ref 0–1)
Basophils Absolute: 0 10*3/uL (ref 0.0–0.1)
EOS ABS: 0 10*3/uL (ref 0.0–0.7)
EOS PCT: 0 % (ref 0–5)
HEMATOCRIT: 33.9 % — AB (ref 36.0–46.0)
HEMOGLOBIN: 11.5 g/dL — AB (ref 12.0–15.0)
LYMPHS ABS: 0.9 10*3/uL (ref 0.7–4.0)
Lymphocytes Relative: 7 % — ABNORMAL LOW (ref 12–46)
MCH: 31.3 pg (ref 26.0–34.0)
MCHC: 33.9 g/dL (ref 30.0–36.0)
MCV: 92.1 fL (ref 78.0–100.0)
MONO ABS: 0.8 10*3/uL (ref 0.1–1.0)
MONOS PCT: 6 % (ref 3–12)
Neutro Abs: 11 10*3/uL — ABNORMAL HIGH (ref 1.7–7.7)
Neutrophils Relative %: 87 % — ABNORMAL HIGH (ref 43–77)
Platelets: 313 10*3/uL (ref 150–400)
RBC: 3.68 MIL/uL — AB (ref 3.87–5.11)
RDW: 12.4 % (ref 11.5–15.5)
WBC: 12.7 10*3/uL — ABNORMAL HIGH (ref 4.0–10.5)

## 2014-10-19 LAB — URINE MICROSCOPIC-ADD ON

## 2014-10-19 LAB — URINALYSIS, ROUTINE W REFLEX MICROSCOPIC
BILIRUBIN URINE: NEGATIVE
Glucose, UA: NEGATIVE mg/dL
Hgb urine dipstick: NEGATIVE
KETONES UR: 15 mg/dL — AB
NITRITE: NEGATIVE
Protein, ur: NEGATIVE mg/dL
SPECIFIC GRAVITY, URINE: 1.046 — AB (ref 1.005–1.030)
UROBILINOGEN UA: 1 mg/dL (ref 0.0–1.0)
pH: 5 (ref 5.0–8.0)

## 2014-10-19 LAB — I-STAT CG4 LACTIC ACID, ED
LACTIC ACID, VENOUS: 1.63 mmol/L (ref 0.5–2.2)
Lactic Acid, Venous: 1.95 mmol/L (ref 0.5–2.2)
Lactic Acid, Venous: 1.47 mmol/L (ref 0.5–2.2)

## 2014-10-19 MED ORDER — CALCIUM CARBONATE-VITAMIN D 500-200 MG-UNIT PO TABS
1.0000 | ORAL_TABLET | Freq: Every day | ORAL | Status: DC
  Administered 2014-10-20 – 2014-10-23 (×4): 1 via ORAL
  Filled 2014-10-19 (×5): qty 1

## 2014-10-19 MED ORDER — PRAVASTATIN SODIUM 80 MG PO TABS
80.0000 mg | ORAL_TABLET | Freq: Every morning | ORAL | Status: DC
  Administered 2014-10-20 – 2014-10-23 (×4): 80 mg via ORAL
  Filled 2014-10-19 (×4): qty 1

## 2014-10-19 MED ORDER — MAGNESIUM CITRATE PO SOLN
1.0000 | Freq: Once | ORAL | Status: AC | PRN
Start: 2014-10-19 — End: 2014-10-19
  Filled 2014-10-19: qty 296

## 2014-10-19 MED ORDER — VITAMIN D3 25 MCG (1000 UNIT) PO TABS
2000.0000 [IU] | ORAL_TABLET | Freq: Every day | ORAL | Status: DC
  Administered 2014-10-20 – 2014-10-23 (×4): 2000 [IU] via ORAL
  Filled 2014-10-19 (×4): qty 2

## 2014-10-19 MED ORDER — SODIUM CHLORIDE 0.9 % IJ SOLN
3.0000 mL | Freq: Two times a day (BID) | INTRAMUSCULAR | Status: DC
  Administered 2014-10-19 – 2014-10-23 (×3): 3 mL via INTRAVENOUS

## 2014-10-19 MED ORDER — ACETAMINOPHEN 650 MG RE SUPP: 650.0000 mg | Freq: Four times a day (QID) | RECTAL | Status: DC | PRN

## 2014-10-19 MED ORDER — HEPARIN SODIUM (PORCINE) 5000 UNIT/ML IJ SOLN
5000.0000 [IU] | Freq: Three times a day (TID) | INTRAMUSCULAR | Status: DC
  Administered 2014-10-19 – 2014-10-20 (×2): 5000 [IU] via SUBCUTANEOUS
  Filled 2014-10-19 (×5): qty 1

## 2014-10-19 MED ORDER — ADULT MULTIVITAMIN W/MINERALS CH
1.0000 | ORAL_TABLET | Freq: Every day | ORAL | Status: DC
  Administered 2014-10-20 – 2014-10-23 (×4): 1 via ORAL
  Filled 2014-10-19 (×5): qty 1

## 2014-10-19 MED ORDER — GABAPENTIN 300 MG PO CAPS
300.0000 mg | ORAL_CAPSULE | Freq: Every day | ORAL | Status: DC
  Administered 2014-10-19 – 2014-10-22 (×4): 300 mg via ORAL
  Filled 2014-10-19 (×5): qty 1

## 2014-10-19 MED ORDER — CALCIUM CARB-CHOLECALCIFEROL 600-800 MG-UNIT PO TABS: 1.0000 | ORAL_TABLET | Freq: Every day | ORAL | Status: DC

## 2014-10-19 MED ORDER — PANTOPRAZOLE SODIUM 40 MG PO TBEC
40.0000 mg | DELAYED_RELEASE_TABLET | Freq: Every day | ORAL | Status: DC
  Administered 2014-10-20 – 2014-10-23 (×4): 40 mg via ORAL
  Filled 2014-10-19 (×4): qty 1

## 2014-10-19 MED ORDER — IOHEXOL 300 MG/ML  SOLN
25.0000 mL | INTRAMUSCULAR | Status: AC
  Administered 2014-10-19: 25 mL via ORAL

## 2014-10-19 MED ORDER — DOCUSATE SODIUM 100 MG PO CAPS
100.0000 mg | ORAL_CAPSULE | Freq: Two times a day (BID) | ORAL | Status: DC
  Administered 2014-10-19 – 2014-10-23 (×7): 100 mg via ORAL
  Filled 2014-10-19 (×10): qty 1

## 2014-10-19 MED ORDER — ASPIRIN EC 81 MG PO TBEC
81.0000 mg | DELAYED_RELEASE_TABLET | Freq: Every day | ORAL | Status: DC
  Administered 2014-10-20 – 2014-10-23 (×4): 81 mg via ORAL
  Filled 2014-10-19 (×4): qty 1

## 2014-10-19 MED ORDER — SODIUM CHLORIDE 0.9 % IV BOLUS (SEPSIS)
250.0000 mL | Freq: Once | INTRAVENOUS | Status: AC
  Administered 2014-10-19: 250 mL via INTRAVENOUS

## 2014-10-19 MED ORDER — BISACODYL 10 MG RE SUPP: 10.0000 mg | Freq: Every day | RECTAL | Status: DC | PRN

## 2014-10-19 MED ORDER — GABAPENTIN 600 MG PO TABS: 300.0000 mg | ORAL_TABLET | Freq: Every day | ORAL | Status: DC

## 2014-10-19 MED ORDER — SODIUM CHLORIDE 0.9 % IV SOLN
INTRAVENOUS | Status: DC
  Administered 2014-10-19 – 2014-10-21 (×3): via INTRAVENOUS

## 2014-10-19 MED ORDER — VITAMIN B-1 100 MG PO TABS
100.0000 mg | ORAL_TABLET | Freq: Every day | ORAL | Status: DC
  Administered 2014-10-19 – 2014-10-23 (×5): 100 mg via ORAL
  Filled 2014-10-19 (×5): qty 1

## 2014-10-19 MED ORDER — ALUM & MAG HYDROXIDE-SIMETH 200-200-20 MG/5ML PO SUSP: 30.0000 mL | Freq: Four times a day (QID) | ORAL | Status: DC | PRN

## 2014-10-19 MED ORDER — SODIUM CHLORIDE 0.9 % IV BOLUS (SEPSIS)
1000.0000 mL | Freq: Once | INTRAVENOUS | Status: AC
  Administered 2014-10-19: 1000 mL via INTRAVENOUS

## 2014-10-19 MED ORDER — VANCOMYCIN HCL IN DEXTROSE 1-5 GM/200ML-% IV SOLN
1000.0000 mg | INTRAVENOUS | Status: DC
  Administered 2014-10-20 – 2014-10-21 (×2): 1000 mg via INTRAVENOUS
  Filled 2014-10-19 (×3): qty 200

## 2014-10-19 MED ORDER — ONDANSETRON HCL 4 MG/2ML IJ SOLN: 4.0000 mg | Freq: Four times a day (QID) | INTRAMUSCULAR | Status: DC | PRN

## 2014-10-19 MED ORDER — VANCOMYCIN HCL IN DEXTROSE 1-5 GM/200ML-% IV SOLN
1000.0000 mg | Freq: Once | INTRAVENOUS | Status: AC
  Administered 2014-10-19: 1000 mg via INTRAVENOUS
  Filled 2014-10-19: qty 200

## 2014-10-19 MED ORDER — MORPHINE SULFATE 2 MG/ML IJ SOLN: 2.0000 mg | INTRAMUSCULAR | Status: DC | PRN

## 2014-10-19 MED ORDER — SODIUM CHLORIDE 0.9 % IV BOLUS (SEPSIS)
1000.0000 mL | INTRAVENOUS | Status: DC
  Administered 2014-10-19: 1000 mL via INTRAVENOUS

## 2014-10-19 MED ORDER — CLINDAMYCIN PHOSPHATE 600 MG/50ML IV SOLN
600.0000 mg | Freq: Three times a day (TID) | INTRAVENOUS | Status: DC
  Administered 2014-10-19 – 2014-10-21 (×6): 600 mg via INTRAVENOUS
  Filled 2014-10-19 (×7): qty 50

## 2014-10-19 MED ORDER — ACETAMINOPHEN 325 MG PO TABS
650.0000 mg | ORAL_TABLET | Freq: Four times a day (QID) | ORAL | Status: DC | PRN
  Administered 2014-10-20 – 2014-10-22 (×4): 650 mg via ORAL
  Filled 2014-10-19 (×4): qty 2

## 2014-10-19 MED ORDER — METOPROLOL SUCCINATE ER 25 MG PO TB24
25.0000 mg | ORAL_TABLET | Freq: Every day | ORAL | Status: DC
  Administered 2014-10-21 – 2014-10-22 (×2): 25 mg via ORAL
  Filled 2014-10-19 (×3): qty 1

## 2014-10-19 MED ORDER — SENNA 8.6 MG PO TABS
1.0000 | ORAL_TABLET | Freq: Every day | ORAL | Status: DC
  Administered 2014-10-19 – 2014-10-21 (×3): 8.6 mg via ORAL
  Filled 2014-10-19 (×5): qty 1

## 2014-10-19 MED ORDER — ACIDOPHILUS PO TABS: 1.0000 | ORAL_TABLET | Freq: Every day | ORAL | Status: DC

## 2014-10-19 MED ORDER — FOLIC ACID 1 MG PO TABS
1.0000 mg | ORAL_TABLET | Freq: Every day | ORAL | Status: DC
  Administered 2014-10-19 – 2014-10-23 (×5): 1 mg via ORAL
  Filled 2014-10-19 (×5): qty 1

## 2014-10-19 MED ORDER — SODIUM CHLORIDE 0.9 % IV SOLN
INTRAVENOUS | Status: DC
  Administered 2014-10-19: 19:00:00 via INTRAVENOUS

## 2014-10-19 MED ORDER — IOHEXOL 300 MG/ML  SOLN
100.0000 mL | Freq: Once | INTRAMUSCULAR | Status: AC | PRN
  Administered 2014-10-19: 100 mL via INTRAVENOUS

## 2014-10-19 MED ORDER — AMIODARONE HCL 100 MG PO TABS
100.0000 mg | ORAL_TABLET | Freq: Every day | ORAL | Status: DC
  Administered 2014-10-20 – 2014-10-23 (×4): 100 mg via ORAL
  Filled 2014-10-19 (×4): qty 1

## 2014-10-19 MED ORDER — LORATADINE 10 MG PO TABS
10.0000 mg | ORAL_TABLET | Freq: Every day | ORAL | Status: DC
  Administered 2014-10-19 – 2014-10-22 (×4): 10 mg via ORAL
  Filled 2014-10-19 (×5): qty 1

## 2014-10-19 MED ORDER — ASPIRIN 81 MG PO TABS: 81.0000 mg | ORAL_TABLET | Freq: Every day | ORAL | Status: DC

## 2014-10-19 MED ORDER — AZTREONAM 1 G IJ SOLR
1.0000 g | Freq: Three times a day (TID) | INTRAMUSCULAR | Status: DC
  Administered 2014-10-19 – 2014-10-22 (×8): 1 g via INTRAVENOUS
  Filled 2014-10-19 (×10): qty 1

## 2014-10-19 MED ORDER — RISAQUAD PO CAPS
1.0000 | ORAL_CAPSULE | Freq: Every day | ORAL | Status: DC
  Administered 2014-10-20 – 2014-10-23 (×4): 1 via ORAL
  Filled 2014-10-19 (×4): qty 1

## 2014-10-19 MED ORDER — ONDANSETRON HCL 4 MG PO TABS: 4.0000 mg | ORAL_TABLET | Freq: Four times a day (QID) | ORAL | Status: DC | PRN

## 2014-10-19 MED ORDER — ZOLPIDEM TARTRATE 5 MG PO TABS
5.0000 mg | ORAL_TABLET | Freq: Every evening | ORAL | Status: DC | PRN
  Administered 2014-10-20 – 2014-10-22 (×3): 5 mg via ORAL
  Filled 2014-10-19 (×3): qty 1

## 2014-10-19 MED ORDER — SENNOSIDES 8.6 MG PO CAPS: 1.0000 | ORAL_CAPSULE | Freq: Every day | ORAL | Status: DC

## 2014-10-19 MED ORDER — ACETAMINOPHEN 500 MG PO TABS
1000.0000 mg | ORAL_TABLET | Freq: Once | ORAL | Status: AC
  Administered 2014-10-19: 1000 mg via ORAL
  Filled 2014-10-19: qty 2

## 2014-10-19 MED ORDER — OXYCODONE HCL 5 MG PO TABS
5.0000 mg | ORAL_TABLET | ORAL | Status: DC | PRN
  Administered 2014-10-19 – 2014-10-23 (×3): 5 mg via ORAL
  Filled 2014-10-19 (×3): qty 1

## 2014-10-19 MED ORDER — VITAMIN C 500 MG PO TABS
500.0000 mg | ORAL_TABLET | Freq: Every day | ORAL | Status: DC
  Administered 2014-10-20 – 2014-10-23 (×4): 500 mg via ORAL
  Filled 2014-10-19 (×4): qty 1
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
## 2014-10-19 NOTE — Progress Notes (Signed)
Unit CM UR Completed by [facility] ED CM  W. Latasha Puskas RN  

## 2014-10-19 NOTE — Progress Notes (Signed)
Report received from [facility] for patient to be admitted into 5w30. RN Marjory Lies stated that critical care MD wanted to keep patient in ED until consulted by surgery.

## 2014-10-19 NOTE — ED Notes (Signed)
Pt completed drinking contrast.  CT made aware.

## 2014-10-19 NOTE — ED Notes (Signed)
XR called regarding time frame for pt return to ED room 7.  XR tech reports that pt is on way back to room now.

## 2014-10-19 NOTE — ED Notes (Signed)
Pt presents with mid abdominal pain intermittent for the past month- hx of hiatal hernia.  Pt recently moved from [place] and was told she was too high risk for surgery.  Pt daughter concerned because patients pain has been getting worse and more persistent within the past 2 weeks.  Pt also admits to productive cough yellow in color.
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
## 2014-10-19 NOTE — ED Notes (Signed)
PA made aware of pt BP.  No further orders received at this time.

## 2014-10-19 NOTE — ED Provider Notes (Signed)
  Physical Exam  BP 110/50 mmHg  Pulse 55  Temp(Src) 99.8 F (37.7 C) (Oral)  Resp 13  SpO2 97%  Physical Exam  Constitutional: She is oriented to person, place, and time. She appears well-developed and well-nourished. No distress.  HENT:  Head: Normocephalic and atraumatic.  Mouth/Throat: Oropharynx is clear and moist. No oropharyngeal exudate.  Eyes: Conjunctivae and EOM are normal. Pupils are equal, round, and reactive to light. No scleral icterus.  Neck: Normal range of motion. Neck supple. No JVD present. No thyromegaly present.  Cardiovascular: Normal rate, regular rhythm, normal heart sounds and intact distal pulses.  Exam reveals no gallop and no friction rub.   No murmur heard. Pulmonary/Chest: Effort normal and breath sounds normal. No respiratory distress. She has no wheezes. She has no rales. She exhibits no tenderness.  Abdominal: Soft. Bowel sounds are normal. She exhibits no distension and no mass. There is tenderness. There is no rebound and no guarding.  4 cm x 5 cm irregular round erythematous, warm, area noted to the anterior abdomen with tenderness to palpation. This area is on top of previous well-healed ventral hernia scar.  Musculoskeletal: Normal range of motion.  Lymphadenopathy:    She has no cervical adenopathy.  Neurological: She is alert and oriented to person, place, and time. She has normal strength. No cranial nerve deficit or sensory deficit. Coordination normal.  Skin: Skin is warm and dry. She is not diaphoretic.  Psychiatric: She has a normal mood and affect. Her behavior is normal. Judgment and thought content normal.  Nursing note and vitals reviewed.   ED Course  Procedures  MDM patient is an 80 year old female who presents to the emergency room for abdominal pain. Physical exam as noted by Roxan Diesel PA-C revealed anterior abdominal wall area of warmth and erythema with general tenderness to palpation of the abdomen. Bedside ultrasound was  performed by Dr. Reather Converse who recommended abdominal CT as this area was likely abscess and was very large. Abdominal CT reveals large subcutaneous ventral abdomen abscess with no obvious bowel involvement at this time. Patient became hypotensive here in the emergency room and was started on Vanco and given fluids and subsequently rebounded. CBC reveals leukocytosis. CMP reveals dehydration. Lactic acid is normal. UA is currently pending. Chest x-ray is negative. I have spoken with Dr. Chancy Milroy from [facility] who will [facility] the patient. He has asked that I speak with general surgery about the patient. Consult to general surgery currently pending.  I have spoken with Dr. Hulen Skains who recommends that the patient have drainage of the abscess likely by IR. He will come to see the patient.        Cherylann Parr, PA-C 10/19/14 1940  Carmin Muskrat, MD 10/20/14 [phone]

## 2014-10-19 NOTE — ED Notes (Signed)
Admitting MD requested that pt stay in ED until surgery sees pt.  Pt and family made aware.

## 2014-10-19 NOTE — ED Notes (Signed)
Admitting physician at bedside.  Made aware of Pt Bp

## 2014-10-20 ENCOUNTER — Encounter (HOSPITAL_COMMUNITY): Payer: Self-pay | Admitting: Certified Registered"

## 2014-10-20 ENCOUNTER — Inpatient Hospital Stay (HOSPITAL_COMMUNITY): Payer: Medicare Other | Admitting: Certified Registered"

## 2014-10-20 ENCOUNTER — Encounter (HOSPITAL_COMMUNITY)
Admission: EM | Disposition: A | Discharge status: Home Health | Payer: Self-pay | Source: Home / Self Care | Attending: Family Medicine

## 2014-10-20 DIAGNOSIS — I951 Orthostatic hypotension: Secondary | ICD-10-CM

## 2014-10-20 HISTORY — PX: IRRIGATION AND DEBRIDEMENT ABSCESS: SHX5252

## 2014-10-20 LAB — CBC
HCT: 29 % — ABNORMAL LOW (ref 36.0–46.0)
HEMOGLOBIN: 9.7 g/dL — AB (ref 12.0–15.0)
MCH: 31.9 pg (ref 26.0–34.0)
MCHC: 33.4 g/dL (ref 30.0–36.0)
MCV: 95.4 fL (ref 78.0–100.0)
Platelets: ADEQUATE 10*3/uL (ref 150–400)
RBC: 3.04 MIL/uL — AB (ref 3.87–5.11)
RDW: 12.9 % (ref 11.5–15.5)
WBC: 8.5 10*3/uL (ref 4.0–10.5)

## 2014-10-20 LAB — COMPREHENSIVE METABOLIC PANEL
ALT: 16 U/L (ref 0–35)
AST: 32 U/L (ref 0–37)
Albumin: 2 g/dL — ABNORMAL LOW (ref 3.5–5.2)
Alkaline Phosphatase: 109 U/L (ref 39–117)
Anion gap: 15 (ref 5–15)
BUN: 16 mg/dL (ref 6–23)
CALCIUM: 8.1 mg/dL — AB (ref 8.4–10.5)
CO2: 21 mEq/L (ref 19–32)
CREATININE: 0.8 mg/dL (ref 0.50–1.10)
Chloride: 101 mEq/L (ref 96–112)
GFR calc non Af Amer: 66 mL/min — ABNORMAL LOW (ref >90)
GFR, EST AFRICAN AMERICAN: 77 mL/min — AB (ref >90)
GLUCOSE: 78 mg/dL (ref 70–99)
Potassium: 3.3 mEq/L — ABNORMAL LOW (ref 3.7–5.3)
Sodium: 137 mEq/L (ref 137–147)
Total Bilirubin: 0.4 mg/dL (ref 0.3–1.2)
Total Protein: 5.8 g/dL — ABNORMAL LOW (ref 6.0–8.3)

## 2014-10-20 LAB — HEMOGLOBIN A1C
HEMOGLOBIN A1C: 5.6 % (ref <5.7)
Mean Plasma Glucose: 114 mg/dL (ref <117)

## 2014-10-20 LAB — SURGICAL PCR SCREEN
MRSA, PCR: POSITIVE — AB
Staphylococcus aureus: POSITIVE — AB

## 2014-10-20 LAB — TSH: TSH: 1.73 u[IU]/mL (ref 0.350–4.500)

## 2014-10-20 LAB — GLUCOSE, CAPILLARY
Glucose-Capillary: 101 mg/dL — ABNORMAL HIGH (ref 70–99)
Glucose-Capillary: 105 mg/dL — ABNORMAL HIGH (ref 70–99)
Glucose-Capillary: 88 mg/dL (ref 70–99)

## 2014-10-20 LAB — MRSA PCR SCREENING: MRSA BY PCR: POSITIVE — AB

## 2014-10-20 SURGERY — IRRIGATION AND DEBRIDEMENT ABSCESS
Anesthesia: General | Site: Abdomen

## 2014-10-20 MED ORDER — HYDROMORPHONE HCL 1 MG/ML IJ SOLN
0.2500 mg | INTRAMUSCULAR | Status: DC | PRN
  Administered 2014-10-21 (×2): 0.5 mg via INTRAVENOUS
  Filled 2014-10-20 (×2): qty 1

## 2014-10-20 MED ORDER — FENTANYL CITRATE 0.05 MG/ML IJ SOLN
INTRAMUSCULAR | Status: AC
  Filled 2014-10-20: qty 5

## 2014-10-20 MED ORDER — PROPOFOL 10 MG/ML IV BOLUS
INTRAVENOUS | Status: AC
  Filled 2014-10-20: qty 20

## 2014-10-20 MED ORDER — EPHEDRINE SULFATE 50 MG/ML IJ SOLN
INTRAMUSCULAR | Status: DC | PRN
  Administered 2014-10-20: 10 mg via INTRAVENOUS
  Administered 2014-10-20: 15 mg via INTRAVENOUS

## 2014-10-20 MED ORDER — ONDANSETRON HCL 4 MG/2ML IJ SOLN: 4.0000 mg | Freq: Once | INTRAMUSCULAR | Status: AC | PRN

## 2014-10-20 MED ORDER — MEPERIDINE HCL 25 MG/ML IJ SOLN: 6.2500 mg | INTRAMUSCULAR | Status: DC | PRN

## 2014-10-20 MED ORDER — ROCURONIUM BROMIDE 50 MG/5ML IV SOLN
INTRAVENOUS | Status: AC
  Filled 2014-10-20: qty 1

## 2014-10-20 MED ORDER — OXYCODONE HCL 5 MG PO TABS: 5.0000 mg | ORAL_TABLET | Freq: Once | ORAL | Status: AC | PRN

## 2014-10-20 MED ORDER — PHENYLEPHRINE HCL 10 MG/ML IJ SOLN
INTRAMUSCULAR | Status: DC | PRN
  Administered 2014-10-20: 120 ug via INTRAVENOUS
  Administered 2014-10-20: 160 ug via INTRAVENOUS

## 2014-10-20 MED ORDER — ARTIFICIAL TEARS OP OINT
TOPICAL_OINTMENT | OPHTHALMIC | Status: AC
  Filled 2014-10-20: qty 3.5

## 2014-10-20 MED ORDER — LIDOCAINE HCL (CARDIAC) 20 MG/ML IV SOLN
INTRAVENOUS | Status: AC
  Filled 2014-10-20: qty 5

## 2014-10-20 MED ORDER — 0.9 % SODIUM CHLORIDE (POUR BTL) OPTIME
TOPICAL | Status: DC | PRN
  Administered 2014-10-20: 1000 mL

## 2014-10-20 MED ORDER — PHENYLEPHRINE 40 MCG/ML (10ML) SYRINGE FOR IV PUSH (FOR BLOOD PRESSURE SUPPORT)
PREFILLED_SYRINGE | INTRAVENOUS | Status: AC
  Filled 2014-10-20: qty 10

## 2014-10-20 MED ORDER — LIDOCAINE HCL (CARDIAC) 20 MG/ML IV SOLN
INTRAVENOUS | Status: DC | PRN
  Administered 2014-10-20: 100 mg via INTRAVENOUS

## 2014-10-20 MED ORDER — PROPOFOL 10 MG/ML IV BOLUS
INTRAVENOUS | Status: DC | PRN
  Administered 2014-10-20: 110 mg via INTRAVENOUS

## 2014-10-20 MED ORDER — OXYCODONE HCL 5 MG/5ML PO SOLN: 5.0000 mg | Freq: Once | ORAL | Status: AC | PRN

## 2014-10-20 MED ORDER — ONDANSETRON HCL 4 MG/2ML IJ SOLN
INTRAMUSCULAR | Status: AC
  Filled 2014-10-20: qty 2

## 2014-10-20 MED ORDER — LACTATED RINGERS IV SOLN
INTRAVENOUS | Status: DC
  Administered 2014-10-20: 15:00:00 via INTRAVENOUS

## 2014-10-20 MED ORDER — FENTANYL CITRATE 0.05 MG/ML IJ SOLN
INTRAMUSCULAR | Status: DC | PRN
  Administered 2014-10-20: 50 ug via INTRAVENOUS

## 2014-10-20 SURGICAL SUPPLY — 35 items
BNDG GAUZE ELAST 4 BULKY (GAUZE/BANDAGES/DRESSINGS) IMPLANT
CANISTER SUCTION 2500CC (MISCELLANEOUS) ×3 IMPLANT
COVER SURGICAL LIGHT HANDLE (MISCELLANEOUS) ×3 IMPLANT
DRAPE LAPAROSCOPIC ABDOMINAL (DRAPES) IMPLANT
DRAPE PED LAPAROTOMY (DRAPES) IMPLANT
DRAPE UTILITY XL STRL (DRAPES) ×12 IMPLANT
DRSG PAD ABDOMINAL 8X10 ST (GAUZE/BANDAGES/DRESSINGS) IMPLANT
ELECT CAUTERY BLADE 6.4 (BLADE) ×3 IMPLANT
ELECT REM PT RETURN 9FT ADLT (ELECTROSURGICAL) ×3
ELECTRODE REM PT RTRN 9FT ADLT (ELECTROSURGICAL) ×1 IMPLANT
GAUZE PACKING IODOFORM 1X5 (MISCELLANEOUS) ×3 IMPLANT
GAUZE SPONGE 4X4 12PLY STRL (GAUZE/BANDAGES/DRESSINGS) IMPLANT
GLOVE BIO SURGEON STRL SZ7.5 (GLOVE) ×3 IMPLANT
GLOVE BIOGEL PI IND STRL 7.5 (GLOVE) ×1 IMPLANT
GLOVE BIOGEL PI IND STRL 8 (GLOVE) ×1 IMPLANT
GLOVE BIOGEL PI INDICATOR 7.5 (GLOVE) ×2
GLOVE BIOGEL PI INDICATOR 8 (GLOVE) ×2
GLOVE SURG SS PI 7.5 STRL IVOR (GLOVE) ×6 IMPLANT
GOWN STRL REUS W/ TWL LRG LVL3 (GOWN DISPOSABLE) ×2 IMPLANT
GOWN STRL REUS W/ TWL XL LVL3 (GOWN DISPOSABLE) ×1 IMPLANT
GOWN STRL REUS W/TWL LRG LVL3 (GOWN DISPOSABLE) ×4
GOWN STRL REUS W/TWL XL LVL3 (GOWN DISPOSABLE) ×2
KIT BASIN OR (CUSTOM PROCEDURE TRAY) ×3 IMPLANT
KIT ROOM TURNOVER OR (KITS) ×3 IMPLANT
NS IRRIG 1000ML POUR BTL (IV SOLUTION) ×3 IMPLANT
PACK GENERAL/GYN (CUSTOM PROCEDURE TRAY) ×3 IMPLANT
PAD ABD 8X10 STRL (GAUZE/BANDAGES/DRESSINGS) ×3 IMPLANT
PAD ARMBOARD 7.5X6 YLW CONV (MISCELLANEOUS) ×3 IMPLANT
SPONGE GAUZE 4X4 12PLY STER LF (GAUZE/BANDAGES/DRESSINGS) ×3 IMPLANT
SWAB COLLECTION DEVICE MRSA (MISCELLANEOUS) ×3 IMPLANT
SWAB CULTURE LIQUID MINI MALE (MISCELLANEOUS) ×3 IMPLANT
TAPE CLOTH SURG 6X10 WHT LF (GAUZE/BANDAGES/DRESSINGS) ×3 IMPLANT
TOWEL OR 17X24 6PK STRL BLUE (TOWEL DISPOSABLE) ×3 IMPLANT
TOWEL OR 17X26 10 PK STRL BLUE (TOWEL DISPOSABLE) ×3 IMPLANT
TUBE ANAEROBIC SPECIMEN COL (MISCELLANEOUS) IMPLANT

## 2014-10-20 NOTE — Anesthesia Postprocedure Evaluation (Signed)
Anesthesia Post Note  Patient: Jill Bowman  Procedure(s) Performed: Procedure(s) (LRB): IRRIGATION AND DEBRIDEMENT ABDOMINAL WALL ABSCESS (N/A)  Anesthesia type: general  Patient location: PACU  Post pain: Pain level controlled  Post assessment: Patient's Cardiovascular Status Stable  Last Vitals:  Filed Vitals:   10/20/14 1650  BP: 96/37  Pulse: 56  Temp:   Resp: 18    Post vital signs: Reviewed and stable  Level of consciousness: sedated  Complications: No apparent anesthesia complications

## 2014-10-20 NOTE — Anesthesia Preprocedure Evaluation (Addendum)
Anesthesia Evaluation  Patient identified by MRN, date of birth, ID band Patient awake    Reviewed: Allergy & Precautions, H&P , NPO status , Patient's Chart, lab work & pertinent test results  Airway Mallampati: I  TM Distance: >3 FB Neck ROM: Full    Dental  (+) Edentulous Upper, Edentulous Lower   Pulmonary former smoker,          Cardiovascular hypertension, Pt. on medications     Neuro/Psych    GI/Hepatic   Endo/Other  diabetes  Renal/GU      Musculoskeletal   Abdominal   Peds  Hematology   Anesthesia Other Findings   Reproductive/Obstetrics                            Anesthesia Physical Anesthesia Plan  ASA: III  Anesthesia Plan: General   Post-op Pain Management:    Induction: Intravenous  Airway Management Planned: LMA  Additional Equipment:   Intra-op Plan:   Post-operative Plan: Extubation in OR  Informed Consent: I have reviewed the patients History and Physical, chart, labs and discussed the procedure including the risks, benefits and alternatives for the proposed anesthesia with the patient or authorized representative who has indicated his/her understanding and acceptance.     Plan Discussed with: CRNA and Surgeon  Anesthesia Plan Comments:         Anesthesia Quick Evaluation

## 2014-10-20 NOTE — Op Note (Addendum)
10/20/2014  5:28 PM  PATIENT:  Jill Bowman  80 y.o. female  PRE-OPERATIVE DIAGNOSIS:  Abdominal Wall Abscess  POST-OPERATIVE DIAGNOSIS:  Abdominal Wall Abscess  PROCEDURE:  Procedure(s): IRRIGATION AND DEBRIDEMENT ABDOMINAL WALL ABSCESS (N/A)  SURGEON:  Surgeon(s) and Role:    * Ralene Ok, MD - Primary  ASSISTANTS: none   ANESTHESIA:   general  EBL:  Total I/O In: 640 [P.O.:240; I.V.:400] Out: -   BLOOD ADMINISTERED:none  DRAINS: 1" iodoform dressing packed into wound   LOCAL MEDICATIONS USED:  NONE  SPECIMEN:  Source of Specimen:  Abscess cavity  DISPOSITION OF SPECIMEN:  micro  COUNTS:  YES  TOURNIQUET:  * No tourniquets in log *  DICTATION: .Dragon Dictation The patient was taken back to the OR after being consented.  She was placed in the supine positino with bilateral SCDs in place.  She was prepped and draped in the usual sterile fashion.  A time out was called and all facts were verified.  A 3cm incision was made to the right lateral area of greatest induration.  A large amount of pus was suctioned out.  Cxs were taken of the abscess cavity.  It should be noted that was exposed mesh at the bottom of the abscess cavity.  The cavity was irragated out with sterile saline.  1" iodoform guaze was used to pack the wound and it was dressed with 4x4s, ABD pad and tape.  The pt tolerated procedure well and was taken to the PACU in stable condition.  PLAN OF CARE: admitted to inpatient  PATIENT DISPOSITION:  PACU - hemodynamically stable.   Delay start of Pharmacological VTE agent (>24hrs) due to surgical blood loss or risk of bleeding: not applicable

## 2014-10-20 NOTE — Progress Notes (Signed)
Pt hypotensive upon arrival 80s/20s. Pt denies dizziness, nausea, chest pain or worsening shortness of breath. Dr. Rosendo Gros in briefly and aware. Will attempt to obtain new, larger bore IV, will continue cardiac monitoring to monitor pt.

## 2014-10-20 NOTE — Plan of Care (Signed)
Problem: Phase I Progression Outcomes Goal: Pain controlled with appropriate interventions Outcome: Completed/Met Date Met:  10/20/14 Goal: OOB as tolerated unless otherwise ordered Outcome: Progressing Goal: Voiding-avoid urinary catheter unless indicated Outcome: Completed/Met Date Met:  10/20/14 Goal: Hemodynamically stable Outcome: Completed/Met Date Met:  10/20/14

## 2014-10-20 NOTE — Transfer of Care (Signed)
Immediate Anesthesia Transfer of Care Note  Patient: Jill Bowman  Procedure(s) Performed: Procedure(s): IRRIGATION AND DEBRIDEMENT ABDOMINAL WALL ABSCESS (N/A)  Patient Location: PACU  Anesthesia Type:General  Level of Consciousness: awake, alert  and oriented  Airway & Oxygen Therapy: Patient Spontanous Breathing  Post-op Assessment: Report given to PACU RN  Post vital signs: Reviewed and stable  Complications: No apparent anesthesia complications
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
## 2014-10-20 NOTE — Anesthesia Procedure Notes (Signed)
Procedure Name: LMA Insertion Date/Time: 10/20/2014 3:58 PM Performed by: Sampson Si E Pre-anesthesia Checklist: Patient identified, Emergency Drugs available, Suction available, Patient being monitored and Timeout performed Patient Re-evaluated:Patient Re-evaluated prior to inductionOxygen Delivery Method: Circle system utilized Preoxygenation: Pre-oxygenation with 100% oxygen Intubation Type: IV induction Ventilation: Mask ventilation without difficulty LMA: LMA inserted LMA Size: 4.0 Number of attempts: 1 Placement Confirmation: positive ETCO2 and breath sounds checked- equal and bilateral Tube secured with: Tape Dental Injury: Teeth and Oropharynx as per pre-operative assessment
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
## 2014-10-20 NOTE — Progress Notes (Signed)
Dr. Conrad Milford also at bedside and aware of low BP and fact that 2 RNs unable to insert larger bore IV. [id number], CRNA at bedside preparing to take pt to OR.

## 2014-10-20 NOTE — Progress Notes (Signed)
[facility] PROGRESS NOTE  [name] EHU:[id number] DOB: May 28, 1929 DOA: 10/19/2014 PCP: Default, Provider, MD  Assessment/Plan:  Recurrent ventral hernia with associated fluid collection, possibly abcess -anterior abdominal wall with area of purulent drainage that is foul smelling. Febrile, no leukocytosis (WBC 8.5) -s/p ventral hernia repair with mesh X2. -CT-periumbilical hernia with region of fluid collection consistent with abscess; hiatal hernia; diverticulosis -Surgery on board, rec's- Plan for drainage in the OR today. Possibly a fistula presenting as an abscess.  Continue IV abx -IV Vancomycin, Clindamycin, and Azactam (day 2) -IVF, pain control, antiemetics  Abdominal Wall Cellulitis -Presented with area in anterior abdominal wall with redness, tenderness, warmth, and purulent drainage- area of cellulitis improved. Continued drainage -Placed on IV Azactam (day 2)  Hypotension -BP continues to be soft -responded to fluid boluses -Continue IV fluids NS 29ml/hr  Hypokalemia -K 3.3 -Kdur 62meq once -Recheck BMET in am  Anemia -Likely dilutional, Hgb 9.7 -Continue folic acid daily -Monitor CBC in am  Tachy-Brady Syndrome -s/p pacemaker 2012- stable -Monitor on telemety  Hyperlipidemia -Pravastatin 80mg  daily  Constipation -Dulcolax, Senekot and colace   Hx of tibia fracture -no complaints.  Atrial Fibrillation -Rate controlled -Amiodarone 100mg  daily   CAD -Denies chest pain -ASA 81 mg daily, Metoprolol 25 mg daily  Sleep apnea -ambien daily   Obesity -BMI   DVT Prophylaxis SQ heparin Code Status: Full Family Communication: Daughter at bedside Disposition Plan: Inpatient   Consultants:  Surgery  Procedures:  IV Azactam,   Antibiotics: IV azactam- 11/23>> IV clindamycin- 11/23 >>> IV Zosyn- 11/23>>>   HPI/Subjective: PMH of recurrent ventral hernia (s/p mesh repari X2 ), Tachy-bradycardia syndrome (s/p pacemaker  2012); CAD( CABG 2005, normal myoview 2011); Atrial fib on amiodarone,hyperlipidemia, constipation, and hx tibial fracture that presents with abdominal pain.  She has associated redness and purulent drainage in the midline of her abdomen. She denies any fever, chill, chest pain, or sob. She admits to decreased oral intakeDaughter states pt had a recent surgery last year in [place].  In the ED, pt is hypotensive but responded to fluid boluses. CT abd/pelvis with evidence of large subcutaneous venttal abdominal abscess. Patient is admitted and Surgery is on board. Pt will undergo drainage in the OR 11/24.  Objective: Filed Vitals:   10/20/14 0508  BP: 97/64  Pulse: 66  Temp: 101.4 F (38.6 C)  Resp: 16   No intake or output data in the 24 hours ending 10/20/14 0834 Filed Weights   10/19/14 1857 10/19/14 2110  Weight: 80.287 kg (177 lb) 83.008 kg (183 lb)    Exam:  Gen: Alert and oriented Caucasian female in NAD HEENT: Normocephalic, atraumatic.  Pupils symmertrical.  Moist mucosa.   Chest: clear to auscultate bilaterally, no ronchi or rales  Cardiac: Regular rate and rhythm, S1-S2, no rubs murmurs or gallops  Abdomen: soft, non tender, non distended, +bowel sounds. No guarding or rigidity. Midline abdominal wall wound, foul smelling, with purulent drainage.  Suprapubic healing sinus tract. Extremities: Symmetrical in appearance without cyanosis or edema. Bilateral LE with surgical scars Neurological: Alert awake oriented to time place and person.  Psychiatric: Appears normal.   Data Reviewed: Basic Metabolic Panel:  Recent Labs Lab 10/19/14 1447 10/20/14 0520  NA 133* 137  K 3.4* 3.3*  CL 93* 101  CO2 24 21  GLUCOSE 112* 78  BUN 21 16  CREATININE 0.97 0.80  CALCIUM 9.1 8.1*   Liver Function Tests:  Recent Labs Lab 10/19/14 1447 10/20/14 0520  AST 44* 32  ALT 23 16  ALKPHOS 137* 109  BILITOT 0.5 0.4  PROT 7.4 5.8*  ALBUMIN 2.7* 2.0*   No results for input(s):  LIPASE, AMYLASE in the last 168 hours. No results for input(s): AMMONIA in the last 168 hours. CBC:  Recent Labs Lab 10/19/14 1447 10/20/14 0520  WBC 12.7* 8.5  NEUTROABS 11.0*  --   HGB 11.5* 9.7*  HCT 33.9* 29.0*  MCV 92.1 95.4  PLT 313 PLATELET CLUMPS NOTED ON SMEAR, COUNT APPEARS ADEQUATE   Cardiac Enzymes: No results for input(s): CKTOTAL, CKMB, CKMBINDEX, TROPONINI in the last 168 hours. BNP (last 3 results) No results for input(s): PROBNP in the last 8760 hours. CBG:  Recent Labs Lab 10/20/14 0825  GLUCAP 101*    Recent Results (from the past 240 hour(s))  Blood Culture (routine x 2)     Status: None (Preliminary result)   Collection Time: 10/19/14  5:35 PM  Result Value Ref Range Status   Specimen Description BLOOD LEFT FOREARM  Final   Special Requests BOTTLES DRAWN AEROBIC AND ANAEROBIC 5CC  Final   Culture PENDING  Incomplete   Report Status PENDING  Incomplete  MRSA PCR Screening     Status: Abnormal   Collection Time: 10/19/14  9:13 PM  Result Value Ref Range Status   MRSA by PCR POSITIVE (A) NEGATIVE Final    Comment:        The GeneXpert MRSA Assay (FDA approved for NASAL specimens only), is one component of a comprehensive MRSA colonization surveillance program. It is not intended to diagnose MRSA infection nor to guide or monitor treatment for MRSA infections. RESULT CALLED TO, READ BACK BY AND VERIFIED WITH: [facility] RN 0101 10/20/14 MITCHELL,L      Studies: Dg Chest 2 View  10/19/2014   CLINICAL DATA:  Abdominal pain for 2 months due to hernia, past history of hernia surgery, CHF, diabetes, asthma, former smoker  EXAM: CHEST  2 VIEW  COMPARISON:  09/30/2013, 05/02/2013 ; correlation CT chest 12/25/2012  FINDINGS: LEFT subclavian sequential transvenous pacemaker leads project at RIGHT atrium and RIGHT ventricle.  Normal heart size, mediastinal contours, and pulmonary vascularity.  Atherosclerotic calcification aorta.  Prominent RIGHT first  costochondral junction unchanged since prior CT.  No acute infiltrate, pleural effusion or pneumothorax.  Bones demineralized with scattered endplate spur formation and biconvex scoliosis of the thoracolumbar spine.  IMPRESSION: No acute abnormalities.   Electronically Signed   By: Lavonia Dana M.D.   On: 10/19/2014 17:33   Ct Abdomen Pelvis W Contrast  10/19/2014   CLINICAL DATA:  Periumbilical abdominal pain.  EXAM: CT ABDOMEN AND PELVIS WITH CONTRAST  TECHNIQUE: Multidetector CT imaging of the abdomen and pelvis was performed using the standard protocol following bolus administration of intravenous contrast.  CONTRAST:  138mL OMNIPAQUE IOHEXOL 300 MG/ML  SOLN  COMPARISON:  CT scan of February 06, 2014.  FINDINGS: Severe multilevel degenerative disc disease is noted in the lumbar spine. Visualized lung bases appear normal.  Status post cholecystectomy. Moderate size hiatal hernia is noted. The spleen and pancreas appear normal. Adrenal glands and kidneys appear normal. No hydronephrosis or evidence of renal obstruction is noted. The appendix appears normal. Diverticulosis of descending and sigmoid colon is noted without inflammation. Moderate size periumbilical hernia is noted which contains a loop of transverse colon without evidence of incarceration or obstruction. However, large fluid collection containing gas is noted in the periumbilical region of the subcutaneous tissues which measures 10.4 x 7.3  x 6.0 cm. Atherosclerotic calcifications of abdominal aorta are noted without aneurysm formation. Urinary bladder appears normal.  IMPRESSION: Moderate size sliding-type hiatal hernia is noted.  Diverticulosis of descending and sigmoid colon is noted without inflammation.  Moderate size periumbilical hernia is noted which contains a loop of transverse colon without evidence of obstruction or incarceration.  Immediately adjacent to this hernia in the subcutaneous tissues of the periumbilical region, is a 66.5 x 7.3 x  6.0 cm fluid collection containing gas consistent with abscess.   Electronically Signed   By: Sabino Dick M.D.   On: 10/19/2014 16:51    Scheduled Meds: . acidophilus  1 capsule Oral Daily  . amiodarone  100 mg Oral Daily  . aspirin EC  81 mg Oral Daily  . aztreonam  1 g Intravenous Q8H  . calcium-vitamin D  1 tablet Oral Daily  . cholecalciferol  2,000 Units Oral Daily  . clindamycin (CLEOCIN) IV  600 mg Intravenous Q8H  . docusate sodium  100 mg Oral BID  . folic acid  1 mg Oral Daily  . gabapentin  300 mg Oral QHS  . heparin  5,000 Units Subcutaneous 3 times per day  . loratadine  10 mg Oral QHS  . metoprolol succinate  25 mg Oral Daily  . multivitamin with minerals  1 tablet Oral Daily  . pantoprazole  40 mg Oral Q1200  . pravastatin  80 mg Oral q morning - 10a  . senna  1 tablet Oral QHS  . sodium chloride  3 mL Intravenous Q12H  . thiamine  100 mg Oral Daily  . vancomycin  1,000 mg Intravenous Q24H  . vitamin C  500 mg Oral Q1200   Continuous Infusions: . sodium chloride 75 mL/hr at 10/20/14 0537    Principal Problem:   Abdominal wall abscess Active Problems:   Morbid obesity   Hyperlipidemia   Hypotension   Bradycardia    Time spent: [place], [facility]  [facility] Pager [phone]. If 7PM-7AM, please contact night-coverage at [email or web address], password [facility] 10/20/2014, 8:34 AM  LOS: 1 day

## 2014-10-20 NOTE — Progress Notes (Signed)
[facility] Progress Note     Subjective: Pt comfortable, c/o pain in her abdomen by the abscess.  Daughter in room says she previously had 2 sinus tracts (one suprapubic and one at her umbilicus that were draining pus.  Now she has a midline abdominal wall wound which is weaping.  Objective: Vital signs in last 24 hours: Temp:  [98.7 F (37.1 C)-101.7 F (38.7 C)] 98.7 F (37.1 C) (11/24 0910) Pulse Rate:  [55-77] 66 (11/24 0508) Resp:  [13-18] 16 (11/24 0508) BP: (94-128)/(36-64) 97/64 mmHg (11/24 0508) SpO2:  [93 %-100 %] 93 % (11/24 0508) FiO2 (%):  [21 %] 21 % (11/23 2112) Weight:  [177 lb (80.287 kg)-183 lb (83.008 kg)] 183 lb (83.008 kg) (11/23 2110) Last BM Date: 10/19/14  Intake/Output from previous day:   Intake/Output this shift:    PE: Gen:  Alert, NAD, pleasant Abd: Soft, ND, tender over midline and mid right of abdomen, +BS, no HSM, 4cm round superficial wound which is macerated and weeping clear fluid, no pus noted, edema/induration extends 10cm below the wound.  There is a sinus tract noted at the umbilicus which is draining purulent drainage.  She has a healing sinus tract suprapubic (just right of midline).  No foul smell.    Lab Results:   Recent Labs  10/19/14 1447 10/20/14 0520  WBC 12.7* 8.5  HGB 11.5* 9.7*  HCT 33.9* 29.0*  PLT 313 PLATELET CLUMPS NOTED ON SMEAR, COUNT APPEARS ADEQUATE   BMET  Recent Labs  10/19/14 1447 10/20/14 0520  NA 133* 137  K 3.4* 3.3*  CL 93* 101  CO2 24 21  GLUCOSE 112* 78  BUN 21 16  CREATININE 0.97 0.80  CALCIUM 9.1 8.1*   PT/INR No results for input(s): LABPROT, INR in the last 72 hours. CMP     Component Value Date/Time   NA 137 10/20/2014 0520   K 3.3* 10/20/2014 0520   CL 101 10/20/2014 0520   CO2 21 10/20/2014 0520   GLUCOSE 78 10/20/2014 0520   BUN 16 10/20/2014 0520   CREATININE 0.80 10/20/2014 0520   CALCIUM 8.1* 10/20/2014 0520   CALCIUM 8.2* 09/11/2012 0555   PROT 5.8*  10/20/2014 0520   ALBUMIN 2.0* 10/20/2014 0520   AST 32 10/20/2014 0520   ALT 16 10/20/2014 0520   ALKPHOS 109 10/20/2014 0520   BILITOT 0.4 10/20/2014 0520   GFRNONAA 66* 10/20/2014 0520   GFRAA 77* 10/20/2014 0520   Lipase     Component Value Date/Time   LIPASE 48 08/16/2012 1920       Studies/Results: Dg Chest 2 View  10/19/2014   CLINICAL DATA:  Abdominal pain for 2 months due to hernia, past history of hernia surgery, CHF, diabetes, asthma, former smoker  EXAM: CHEST  2 VIEW  COMPARISON:  09/30/2013, 05/02/2013 ; correlation CT chest 12/25/2012  FINDINGS: LEFT subclavian sequential transvenous pacemaker leads project at RIGHT atrium and RIGHT ventricle.  Normal heart size, mediastinal contours, and pulmonary vascularity.  Atherosclerotic calcification aorta.  Prominent RIGHT first costochondral junction unchanged since prior CT.  No acute infiltrate, pleural effusion or pneumothorax.  Bones demineralized with scattered endplate spur formation and biconvex scoliosis of the thoracolumbar spine.  IMPRESSION: No acute abnormalities.   Electronically Signed   By: Lavonia Dana M.D.   On: 10/19/2014 17:33   Ct Abdomen Pelvis W Contrast  10/19/2014   CLINICAL DATA:  Periumbilical abdominal pain.  EXAM: CT ABDOMEN AND PELVIS WITH CONTRAST  TECHNIQUE: Multidetector  CT imaging of the abdomen and pelvis was performed using the standard protocol following bolus administration of intravenous contrast.  CONTRAST:  168mL OMNIPAQUE IOHEXOL 300 MG/ML  SOLN  COMPARISON:  CT scan of February 06, 2014.  FINDINGS: Severe multilevel degenerative disc disease is noted in the lumbar spine. Visualized lung bases appear normal.  Status post cholecystectomy. Moderate size hiatal hernia is noted. The spleen and pancreas appear normal. Adrenal glands and kidneys appear normal. No hydronephrosis or evidence of renal obstruction is noted. The appendix appears normal. Diverticulosis of descending and sigmoid colon is noted  without inflammation. Moderate size periumbilical hernia is noted which contains a loop of transverse colon without evidence of incarceration or obstruction. However, large fluid collection containing gas is noted in the periumbilical region of the subcutaneous tissues which measures 10.4 x 7.3 x 6.0 cm. Atherosclerotic calcifications of abdominal aorta are noted without aneurysm formation. Urinary bladder appears normal.  IMPRESSION: Moderate size sliding-type hiatal hernia is noted.  Diverticulosis of descending and sigmoid colon is noted without inflammation.  Moderate size periumbilical hernia is noted which contains a loop of transverse colon without evidence of obstruction or incarceration.  Immediately adjacent to this hernia in the subcutaneous tissues of the periumbilical region, is a 62.8 x 7.3 x 6.0 cm fluid collection containing gas consistent with abscess.   Electronically Signed   By: Sabino Dick M.D.   On: 10/19/2014 16:51    Anti-infectives: Anti-infectives    Start     Dose/Rate Route Frequency Ordered Stop   10/20/14 1800  vancomycin (VANCOCIN) IVPB 1000 mg/200 mL premix     1,000 mg200 mL/hr over 60 Minutes Intravenous Every 24 hours 10/19/14 1902     10/19/14 1930  aztreonam (AZACTAM) 1 g in dextrose 5 % 50 mL IVPB     1 g100 mL/hr over 30 Minutes Intravenous Every 8 hours 10/19/14 1902     10/19/14 1930  clindamycin (CLEOCIN) IVPB 600 mg     600 mg100 mL/hr over 30 Minutes Intravenous Every 8 hours 10/19/14 1902     10/19/14 1630  vancomycin (VANCOCIN) IVPB 1000 mg/200 mL premix     1,000 mg200 mL/hr over 60 Minutes Intravenous  Once 10/19/14 1629 10/19/14 1856       Assessment/Plan Recurrent ventral hernia with associated abdominal wall fluid collection, possibly an abscess S/p ventral hernia repair with mesh x 2 Leukocytosis - now 8.5   Plan: 1.  Vanco and clindamycin 2.  IVF, pain control, antiemetics 3.  Ambulating and IS 4.  SCD's, on heparin and aspirin 5.   Given the large abscess, possibly necrotic tissue, and proximity to hernia  would be better served in the OR for I&D and debridement.  She also has a sinus tract to the right of her umbilicus which I am concerned is connected to the abscess.  It is draining purulent drainage.  I'm also concerned that she may have a fistula from the small bowel in her hernia to her subcutaneous tissue.   6.  Will call cards for pre-op clearance 7.  Will talk to Dr. Rosendo Gros, but likely OR tomorrow    LOS: 1 day    [place], [facility] 10/20/2014, 9:53 AM Pager: [phone]

## 2014-10-20 NOTE — Care Management Note (Unsigned)
    Page 1 of 1   10/20/2014     5:35:18 PM CARE MANAGEMENT NOTE 10/20/2014  Patient:  Jill Bowman, Jill Bowman   Account Number:  [id number]  Date Initiated:  10/20/2014  Documentation initiated by:  Tomi Bamberger  Subjective/Objective Assessment:   dx abd wall abscess  admit- from home     Action/Plan:   Anticipated DC Date:  10/21/2014   Anticipated DC Plan:  [place]  CM consult      Choice offered to / List presented to:             Status of service:  In process, will continue to follow Medicare Important Message given?  YES (If response is "NO", the following Medicare IM given date fields will be blank) Date Medicare IM given:  10/20/2014 Medicare IM given by:  Tomi Bamberger Date Additional Medicare IM given:   Additional Medicare IM given by:    Discharge Disposition:    Per UR Regulation:    If discussed at Long Length of Stay Meetings, dates discussed:    Comments:  10/20/14 [place], BSN [phone] patient is from home.  NCM will continue to follow for dc needs.

## 2014-10-21 ENCOUNTER — Encounter (HOSPITAL_COMMUNITY): Payer: Self-pay | Admitting: General Surgery

## 2014-10-21 LAB — CBC
HEMATOCRIT: 26.4 % — AB (ref 36.0–46.0)
HEMOGLOBIN: 8.8 g/dL — AB (ref 12.0–15.0)
MCH: 30.9 pg (ref 26.0–34.0)
MCHC: 33.3 g/dL (ref 30.0–36.0)
MCV: 92.6 fL (ref 78.0–100.0)
Platelets: 318 10*3/uL (ref 150–400)
RBC: 2.85 MIL/uL — AB (ref 3.87–5.11)
RDW: 12.9 % (ref 11.5–15.5)
WBC: 6.9 10*3/uL (ref 4.0–10.5)

## 2014-10-21 LAB — IRON AND TIBC
IRON: 30 ug/dL — AB (ref 42–135)
SATURATION RATIOS: 23 % (ref 20–55)
TIBC: 128 ug/dL — AB (ref 250–470)
UIBC: 98 ug/dL — AB (ref 125–400)

## 2014-10-21 LAB — BASIC METABOLIC PANEL
ANION GAP: 16 — AB (ref 5–15)
BUN: 17 mg/dL (ref 6–23)
CALCIUM: 7.9 mg/dL — AB (ref 8.4–10.5)
CHLORIDE: 100 meq/L (ref 96–112)
CO2: 22 meq/L (ref 19–32)
Creatinine, Ser: 0.88 mg/dL (ref 0.50–1.10)
GFR calc Af Amer: 69 mL/min — ABNORMAL LOW (ref >90)
GFR calc non Af Amer: 59 mL/min — ABNORMAL LOW (ref >90)
GLUCOSE: 109 mg/dL — AB (ref 70–99)
POTASSIUM: 2.9 meq/L — AB (ref 3.7–5.3)
SODIUM: 138 meq/L (ref 137–147)

## 2014-10-21 LAB — URINE CULTURE
COLONY COUNT: NO GROWTH
Culture: NO GROWTH

## 2014-10-21 LAB — RETICULOCYTES
RBC.: 2.87 MIL/uL — ABNORMAL LOW (ref 3.87–5.11)
Retic Count, Absolute: 34.4 10*3/uL (ref 19.0–186.0)
Retic Ct Pct: 1.2 % (ref 0.4–3.1)

## 2014-10-21 LAB — MAGNESIUM: Magnesium: 1.4 mg/dL — ABNORMAL LOW (ref 1.5–2.5)

## 2014-10-21 LAB — GLUCOSE, CAPILLARY: GLUCOSE-CAPILLARY: 88 mg/dL (ref 70–99)

## 2014-10-21 MED ORDER — HYPROMELLOSE (GONIOSCOPIC) 2.5 % OP SOLN: 2.0000 [drp] | Freq: Three times a day (TID) | OPHTHALMIC | Status: DC | PRN

## 2014-10-21 MED ORDER — BOOST / RESOURCE BREEZE PO LIQD
1.0000 | Freq: Two times a day (BID) | ORAL | Status: DC
  Administered 2014-10-21 – 2014-10-23 (×4): 1 via ORAL

## 2014-10-21 MED ORDER — PRO-STAT SUGAR FREE PO LIQD
30.0000 mL | Freq: Two times a day (BID) | ORAL | Status: DC
  Administered 2014-10-21 – 2014-10-23 (×5): 30 mL via ORAL
  Filled 2014-10-21 (×6): qty 30

## 2014-10-21 MED ORDER — POTASSIUM CHLORIDE CRYS ER 20 MEQ PO TBCR
40.0000 meq | EXTENDED_RELEASE_TABLET | Freq: Once | ORAL | Status: AC
  Administered 2014-10-21: 40 meq via ORAL

## 2014-10-21 MED ORDER — POTASSIUM CHLORIDE CRYS ER 20 MEQ PO TBCR
40.0000 meq | EXTENDED_RELEASE_TABLET | Freq: Once | ORAL | Status: AC
  Administered 2014-10-21: 40 meq via ORAL
  Filled 2014-10-21: qty 2

## 2014-10-21 MED ORDER — CHLORHEXIDINE GLUCONATE CLOTH 2 % EX PADS
6.0000 | MEDICATED_PAD | Freq: Every day | CUTANEOUS | Status: DC
  Administered 2014-10-21 – 2014-10-23 (×3): 6 via TOPICAL

## 2014-10-21 MED ORDER — MUPIROCIN 2 % EX OINT
TOPICAL_OINTMENT | Freq: Two times a day (BID) | CUTANEOUS | Status: DC
Start: 2014-10-21 — End: 2014-10-23
  Administered 2014-10-21 – 2014-10-23 (×4): via NASAL
  Filled 2014-10-21 (×2): qty 22

## 2014-10-21 MED ORDER — SODIUM CHLORIDE 0.9 % IV SOLN
INTRAVENOUS | Status: DC
  Administered 2014-10-21: 22:00:00 via INTRAVENOUS
  Administered 2014-10-22: 1000 mL via INTRAVENOUS

## 2014-10-21 MED ORDER — POLYVINYL ALCOHOL 1.4 % OP SOLN
1.0000 [drp] | Freq: Three times a day (TID) | OPHTHALMIC | Status: DC | PRN
  Administered 2014-10-21: 1 [drp] via OPHTHALMIC
  Filled 2014-10-21: qty 15

## 2014-10-21 NOTE — Plan of Care (Signed)
Problem: Phase I Progression Outcomes Goal: Tubes/drains patent Outcome: Not Applicable Date Met:  07/86/75

## 2014-10-21 NOTE — Progress Notes (Signed)
1 Day Post-Op  Subjective: Pt doing well.  Objective: Vital signs in last 24 hours: Temp:  [98 F (36.7 C)-98.7 F (37.1 C)] 98.5 F (36.9 C) (11/25 0645) Pulse Rate:  [56-65] 62 (11/25 0645) Resp:  [14-23] 18 (11/25 0645) BP: (81-104)/(29-72) 99/41 mmHg (11/25 0645) SpO2:  [95 %-100 %] 97 % (11/25 0645) Last BM Date: 10/19/14  Intake/Output from previous day: 11/24 0701 - 11/25 0700 In: 640 [P.O.:240; I.V.:400] Out: -  Intake/Output this shift: Total I/O In: 120 [P.O.:120] Out: -   General appearance: alert and cooperative Resp: clear to auscultation bilaterally Cardio: regular rate and rhythm, S1, S2 normal, no murmur, click, rub or gallop GI: abd wound packed, SS, min ttp  Lab Results:   Recent Labs  10/20/14 0520 10/21/14 0607  WBC 8.5 6.9  HGB 9.7* 8.8*  HCT 29.0* 26.4*  PLT PLATELET CLUMPS NOTED ON SMEAR, COUNT APPEARS ADEQUATE 318   BMET  Recent Labs  10/19/14 1447 10/20/14 0520  NA 133* 137  K 3.4* 3.3*  CL 93* 101  CO2 24 21  GLUCOSE 112* 78  BUN 21 16  CREATININE 0.97 0.80  CALCIUM 9.1 8.1*   PT/INR No results for input(s): LABPROT, INR in the last 72 hours. ABG No results for input(s): PHART, HCO3 in the last 72 hours.  Invalid input(s): PCO2, PO2  Studies/Results: Dg Chest 2 View  10/19/2014   CLINICAL DATA:  Abdominal pain for 2 months due to hernia, past history of hernia surgery, CHF, diabetes, asthma, former smoker  EXAM: CHEST  2 VIEW  COMPARISON:  09/30/2013, 05/02/2013 ; correlation CT chest 12/25/2012  FINDINGS: LEFT subclavian sequential transvenous pacemaker leads project at RIGHT atrium and RIGHT ventricle.  Normal heart size, mediastinal contours, and pulmonary vascularity.  Atherosclerotic calcification aorta.  Prominent RIGHT first costochondral junction unchanged since prior CT.  No acute infiltrate, pleural effusion or pneumothorax.  Bones demineralized with scattered endplate spur formation and biconvex scoliosis of the  thoracolumbar spine.  IMPRESSION: No acute abnormalities.   Electronically Signed   By: Lavonia Dana M.D.   On: 10/19/2014 17:33   Ct Abdomen Pelvis W Contrast  10/19/2014   CLINICAL DATA:  Periumbilical abdominal pain.  EXAM: CT ABDOMEN AND PELVIS WITH CONTRAST  TECHNIQUE: Multidetector CT imaging of the abdomen and pelvis was performed using the standard protocol following bolus administration of intravenous contrast.  CONTRAST:  157mL OMNIPAQUE IOHEXOL 300 MG/ML  SOLN  COMPARISON:  CT scan of February 06, 2014.  FINDINGS: Severe multilevel degenerative disc disease is noted in the lumbar spine. Visualized lung bases appear normal.  Status post cholecystectomy. Moderate size hiatal hernia is noted. The spleen and pancreas appear normal. Adrenal glands and kidneys appear normal. No hydronephrosis or evidence of renal obstruction is noted. The appendix appears normal. Diverticulosis of descending and sigmoid colon is noted without inflammation. Moderate size periumbilical hernia is noted which contains a loop of transverse colon without evidence of incarceration or obstruction. However, large fluid collection containing gas is noted in the periumbilical region of the subcutaneous tissues which measures 10.4 x 7.3 x 6.0 cm. Atherosclerotic calcifications of abdominal aorta are noted without aneurysm formation. Urinary bladder appears normal.  IMPRESSION: Moderate size sliding-type hiatal hernia is noted.  Diverticulosis of descending and sigmoid colon is noted without inflammation.  Moderate size periumbilical hernia is noted which contains a loop of transverse colon without evidence of obstruction or incarceration.  Immediately adjacent to this hernia in the subcutaneous tissues of the  periumbilical region, is a 09.2 x 7.3 x 6.0 cm fluid collection containing gas consistent with abscess.   Electronically Signed   By: Sabino Dick M.D.   On: 10/19/2014 16:51    Anti-infectives: Anti-infectives    Start      Dose/Rate Route Frequency Ordered Stop   10/20/14 1800  vancomycin (VANCOCIN) IVPB 1000 mg/200 mL premix     1,000 mg200 mL/hr over 60 Minutes Intravenous Every 24 hours 10/19/14 1902     10/19/14 1930  aztreonam (AZACTAM) 1 g in dextrose 5 % 50 mL IVPB     1 g100 mL/hr over 30 Minutes Intravenous Every 8 hours 10/19/14 1902     10/19/14 1930  clindamycin (CLEOCIN) IVPB 600 mg     600 mg100 mL/hr over 30 Minutes Intravenous Every 8 hours 10/19/14 1902     10/19/14 1630  vancomycin (VANCOCIN) IVPB 1000 mg/200 mL premix     1,000 mg200 mL/hr over 60 Minutes Intravenous  Once 10/19/14 1629 10/19/14 1856      Assessment/Plan: s/p Procedure(s): IRRIGATION AND DEBRIDEMENT ABDOMINAL WALL ABSCESS (N/A) Con't abx Micro pending TID dressing changes   LOS: 2 days    Rosario Jacks., Anne Hahn 10/21/2014

## 2014-10-21 NOTE — Plan of Care (Signed)
Problem: Phase I Progression Outcomes Goal: Incision/dressings dry and intact Outcome: Progressing

## 2014-10-21 NOTE — Progress Notes (Signed)
[facility] PROGRESS NOTE  Jill Bowman BSW:[id number] DOB: 05-05-1929 DOA: 10/19/2014 PCP: Default, Provider, MD  Assessment/Plan:  Recurrent ventral hernia with associated fluid collection, possibly abcess -Anterior abdominal wall with area of purulent drainage that is foul smelling. Afebrile, no leukocytosis -CT-periumbilical hernia with region of fluid collection consistent with abscess; hiatal hernia; diverticulosis -Surgery on board, rec's- I & D of abcess (11/25)  -Continue IV Vancomycin, Clindamycin, and Azactam (day 3) -Awaiting cultures. -IVF, pain control, antiemetics  Abdominal Wall Cellulitis -Presented with area in anterior abdominal wall with redness, tenderness, warmth, and purulent drainage- area of cellulitis improved. -I & D of abscess on 11/25 -Placed on IV Azactam (day 2)  Hypotension -BP continues to be soft -responded to fluid boluses -Continue IV fluids NS 19ml/hr  Hypokalemia -K 2.9 -magnesium pending -Kdur 27meq x2 doses 4 hours apart -Recheck BMET in am  Anemia -Likely dilutional, Hgb 8.8 -Anemia panel pending -Continue folic acid daily -Monitor CBC in am  Tachy-Brady Syndrome -s/p pacemaker 2012- stable -Monitor on telemety  Hyperlipidemia -Pravastatin 80mg  daily  Constipation -Dulcolax, Senekot and colace   Hx of tibia fracture -no complaints.  Atrial Fibrillation -Rate controlled -Amiodarone 100mg  daily   CAD -Denies chest pain -ASA 81 mg daily, Metoprolol 25 mg daily  Sleep apnea -Ambien daily     DVT Prophylaxis:  SCDs Code Status: Full Family Communication: Daughter at bedside Disposition Plan: Home when stable   Consultants:  Surgery  Procedures:  11/25: Irrigation and Debridement of Abdominal wall abscess    Antibiotics:  IV Azactam, IV Zosyn, IV clindamycin (day3)  HPI/Subjective:  PMH of recurrent ventral hernia (s/p mesh repari X2 ), Tachy-bradycardia syndrome (s/p pacemaker 2012); CAD(  CABG 2005, normal myoview 2011); Atrial fib on amiodarone,hyperlipidemia, constipation, and hx tibial fracture that presents with abdominal pain. She has associated redness and purulent drainage in the midline of her abdomen. She denies any fever, chill, chest pain, or sob. She admits to decreased oral intakeDaughter states pt had a recent surgery last year in [place]. In the ED, pt is hypotensive but responded to fluid boluses. CT abd/pelvis with evidence of large subcutaneous venttal abdominal abscess. Patient is admitted and Surgery is on board. Pt will underwent drainage in the OR 11/24.  Objective: Filed Vitals:   10/21/14 1318  BP: 93/36  Pulse: 61  Temp: 99.2 F (37.3 C)  Resp: 16    Intake/Output Summary (Last 24 hours) at 10/21/14 1327 Last data filed at 10/21/14 1044  Gross per 24 hour  Intake    520 ml  Output      0 ml  Net    520 ml   Filed Weights   10/19/14 1857 10/19/14 2110  Weight: 80.287 kg (177 lb) 83.008 kg (183 lb)    Exam:  Gen: Alert and oriented, Caucasian female in NAD HEENT: Normocephalic, atraumatic.  Pupils symmertrical. Mouth without dentures.  Moist mucosa.   Chest: clear to auscultate bilaterally, no ronchi or rales  Cardiac: Regular rate and rhythm, S1-S2, no rubs murmurs or gallops  Abdomen: soft, non tender, non distended, +bowel sounds. No guarding or rigidity  Extremities: Symmetrical in appearance without cyanosis or edema.  BLE with surgical scars Neurological: Alert awake oriented to time place and person.  Psychiatric: Appears normal.   Data Reviewed: Basic Metabolic Panel:  Recent Labs Lab 10/19/14 1447 10/20/14 0520 10/21/14 0938  NA 133* 137 138  K 3.4* 3.3* 2.9*  CL 93* 101 100  CO2 24 21 22  GLUCOSE 112* 78 109*  BUN 21 16 17   CREATININE 0.97 0.80 0.88  CALCIUM 9.1 8.1* 7.9*   Liver Function Tests:  Recent Labs Lab 10/19/14 1447 10/20/14 0520  AST 44* 32  ALT 23 16  ALKPHOS 137* 109  BILITOT 0.5 0.4  PROT  7.4 5.8*  ALBUMIN 2.7* 2.0*   No results for input(s): LIPASE, AMYLASE in the last 168 hours. No results for input(s): AMMONIA in the last 168 hours. CBC:  Recent Labs Lab 10/19/14 1447 10/20/14 0520 10/21/14 0607  WBC 12.7* 8.5 6.9  NEUTROABS 11.0*  --   --   HGB 11.5* 9.7* 8.8*  HCT 33.9* 29.0* 26.4*  MCV 92.1 95.4 92.6  PLT 313 PLATELET CLUMPS NOTED ON SMEAR, COUNT APPEARS ADEQUATE 318   Cardiac Enzymes: No results for input(s): CKTOTAL, CKMB, CKMBINDEX, TROPONINI in the last 168 hours. BNP (last 3 results) No results for input(s): PROBNP in the last 8760 hours. CBG:  Recent Labs Lab 10/20/14 0825 10/20/14 1456 10/20/14 1624 10/21/14 0815  GLUCAP 101* 105* 88 88    Recent Results (from the past 240 hour(s))  Blood Culture (routine x 2)     Status: None (Preliminary result)   Collection Time: 10/19/14  5:35 PM  Result Value Ref Range Status   Specimen Description BLOOD LEFT FOREARM  Final   Special Requests BOTTLES DRAWN AEROBIC AND ANAEROBIC 5CC  Final   Culture PENDING  Incomplete   Report Status PENDING  Incomplete  Urine culture     Status: None   Collection Time: 10/19/14  5:56 PM  Result Value Ref Range Status   Specimen Description URINE, CATHETERIZED  Final   Special Requests NONE  Final   Culture  Setup Time   Final    10/20/2014 06:11 Performed at [place] Performed at [facility]   Final   Culture NO GROWTH Performed at [facility]   Final   Report Status 10/21/2014 FINAL  Final  MRSA PCR Screening     Status: Abnormal   Collection Time: 10/19/14  9:13 PM  Result Value Ref Range Status   MRSA by PCR POSITIVE (A) NEGATIVE Final    Comment:        The GeneXpert MRSA Assay (FDA approved for NASAL specimens only), is one component of a comprehensive MRSA colonization surveillance program. It is not intended to diagnose MRSA infection nor to guide or monitor treatment for MRSA  infections. RESULT CALLED TO, READ BACK BY AND VERIFIED WITH: [facility] RN 0101 10/20/14 MITCHELL,L   Surgical pcr screen     Status: Abnormal   Collection Time: 10/20/14  2:27 PM  Result Value Ref Range Status   MRSA, PCR POSITIVE (A) NEGATIVE Final   Staphylococcus aureus POSITIVE (A) NEGATIVE Final    Comment:        The Xpert SA Assay (FDA approved for NASAL specimens in patients over 3 years of age), is one component of a comprehensive surveillance program.  Test performance has been validated by [employer] for patients greater than or equal to 81 year old. It is not intended to diagnose infection nor to guide or monitor treatment.   Culture, routine-abscess     Status: None (Preliminary result)   Collection Time: 10/20/14  4:05 PM  Result Value Ref Range Status   Specimen Description ABSCESS ABDOMEN  Final   Special Requests POF VANCOMYCIN AND CLEOCIN  Final   Gram Stain  Final    ABUNDANT WBC PRESENT, PREDOMINANTLY PMN NO SQUAMOUS EPITHELIAL CELLS SEEN MODERATE GRAM POSITIVE COCCI IN PAIRS IN CHAINS Performed at [facility]    Culture   Final    Culture reincubated for better growth Performed at [facility]    Report Status PENDING  Incomplete  Anaerobic culture     Status: None (Preliminary result)   Collection Time: 10/20/14  4:05 PM  Result Value Ref Range Status   Specimen Description ABSCESS ABDOMEN  Final   Special Requests POF VANCOMYCIN AND CLEOCIN  Final   Gram Stain   Final    ABUNDANT WBC PRESENT, PREDOMINANTLY PMN NO SQUAMOUS EPITHELIAL CELLS SEEN ABUNDANT GRAM POSITIVE COCCI IN PAIRS IN CHAINS Performed at [facility]    Culture   Final    NO ANAEROBES ISOLATED; CULTURE IN PROGRESS FOR 5 DAYS Performed at [facility]    Report Status PENDING  Incomplete     Studies: Dg Chest 2 View  10/19/2014   CLINICAL DATA:  Abdominal pain for 2 months due to hernia, past history of hernia surgery, CHF,  diabetes, asthma, former smoker  EXAM: CHEST  2 VIEW  COMPARISON:  09/30/2013, 05/02/2013 ; correlation CT chest 12/25/2012  FINDINGS: LEFT subclavian sequential transvenous pacemaker leads project at RIGHT atrium and RIGHT ventricle.  Normal heart size, mediastinal contours, and pulmonary vascularity.  Atherosclerotic calcification aorta.  Prominent RIGHT first costochondral junction unchanged since prior CT.  No acute infiltrate, pleural effusion or pneumothorax.  Bones demineralized with scattered endplate spur formation and biconvex scoliosis of the thoracolumbar spine.  IMPRESSION: No acute abnormalities.   Electronically Signed   By: Lavonia Dana M.D.   On: 10/19/2014 17:33   Ct Abdomen Pelvis W Contrast  10/19/2014   CLINICAL DATA:  Periumbilical abdominal pain.  EXAM: CT ABDOMEN AND PELVIS WITH CONTRAST  TECHNIQUE: Multidetector CT imaging of the abdomen and pelvis was performed using the standard protocol following Bowman administration of intravenous contrast.  CONTRAST:  119mL OMNIPAQUE IOHEXOL 300 MG/ML  SOLN  COMPARISON:  CT scan of February 06, 2014.  FINDINGS: Severe multilevel degenerative disc disease is noted in the lumbar spine. Visualized lung bases appear normal.  Status post cholecystectomy. Moderate size hiatal hernia is noted. The spleen and pancreas appear normal. Adrenal glands and kidneys appear normal. No hydronephrosis or evidence of renal obstruction is noted. The appendix appears normal. Diverticulosis of descending and sigmoid colon is noted without inflammation. Moderate size periumbilical hernia is noted which contains a loop of transverse colon without evidence of incarceration or obstruction. However, large fluid collection containing gas is noted in the periumbilical region of the subcutaneous tissues which measures 10.4 x 7.3 x 6.0 cm. Atherosclerotic calcifications of abdominal aorta are noted without aneurysm formation. Urinary bladder appears normal.  IMPRESSION: Moderate size  sliding-type hiatal hernia is noted.  Diverticulosis of descending and sigmoid colon is noted without inflammation.  Moderate size periumbilical hernia is noted which contains a loop of transverse colon without evidence of obstruction or incarceration.  Immediately adjacent to this hernia in the subcutaneous tissues of the periumbilical region, is a 68.3 x 7.3 x 6.0 cm fluid collection containing gas consistent with abscess.   Electronically Signed   By: Sabino Dick M.D.   On: 10/19/2014 16:51    Scheduled Meds: . acidophilus  1 capsule Oral Daily  . amiodarone  100 mg Oral Daily  . aspirin EC  81 mg Oral Daily  . aztreonam  1 g Intravenous Q8H  . calcium-vitamin D  1 tablet Oral Daily  . Chlorhexidine Gluconate Cloth  6 each Topical Q0600  . cholecalciferol  2,000 Units Oral Daily  . clindamycin (CLEOCIN) IV  600 mg Intravenous Q8H  . docusate sodium  100 mg Oral BID  . feeding supplement (PRO-STAT SUGAR FREE 64)  30 mL Oral BID AC  . feeding supplement (RESOURCE BREEZE)  1 Container Oral BID WC  . folic acid  1 mg Oral Daily  . gabapentin  300 mg Oral QHS  . loratadine  10 mg Oral QHS  . metoprolol succinate  25 mg Oral Daily  . multivitamin with minerals  1 tablet Oral Daily  . mupirocin ointment   Nasal BID  . pantoprazole  40 mg Oral Q1200  . potassium chloride  40 mEq Oral Once   Followed by  . potassium chloride  40 mEq Oral Once  . pravastatin  80 mg Oral q morning - 10a  . senna  1 tablet Oral QHS  . sodium chloride  3 mL Intravenous Q12H  . thiamine  100 mg Oral Daily  . vancomycin  1,000 mg Intravenous Q24H  . vitamin C  500 mg Oral Q1200   Continuous Infusions: . sodium chloride 75 mL/hr at 10/21/14 0334  . lactated ringers 10 mL/hr at 10/20/14 1509    Principal Problem:   Abdominal wall abscess Active Problems:   Morbid obesity   Hyperlipidemia   Hypotension   Bradycardia    Time spent: [place], [place] [facility]  [facility] Pager [phone]. If  7PM-7AM, please contact night-coverage at [email or web address], password [facility] 10/21/2014, 1:27 PM  LOS: 2 days

## 2014-10-21 NOTE — Plan of Care (Signed)
Problem: Phase I Progression Outcomes Goal: Voiding-avoid urinary catheter unless indicated Outcome: Completed/Met Date Met:  10/21/14

## 2014-10-21 NOTE — Plan of Care (Signed)
Problem: Phase I Progression Outcomes Goal: Pain controlled with appropriate interventions Outcome: Completed/Met Date Met:  10/21/14     

## 2014-10-22 LAB — PROTIME-INR
INR: 1.25 (ref 0.00–1.49)
Prothrombin Time: 15.8 seconds — ABNORMAL HIGH (ref 11.6–15.2)

## 2014-10-22 LAB — COMPREHENSIVE METABOLIC PANEL
ALT: 12 U/L (ref 0–35)
AST: 19 U/L (ref 0–37)
Albumin: 2 g/dL — ABNORMAL LOW (ref 3.5–5.2)
Alkaline Phosphatase: 88 U/L (ref 39–117)
Anion gap: 13 (ref 5–15)
BUN: 20 mg/dL (ref 6–23)
CHLORIDE: 105 meq/L (ref 96–112)
CO2: 23 mEq/L (ref 19–32)
CREATININE: 0.87 mg/dL (ref 0.50–1.10)
Calcium: 7.9 mg/dL — ABNORMAL LOW (ref 8.4–10.5)
GFR calc Af Amer: 69 mL/min — ABNORMAL LOW (ref >90)
GFR calc non Af Amer: 60 mL/min — ABNORMAL LOW (ref >90)
Glucose, Bld: 82 mg/dL (ref 70–99)
Potassium: 3.7 mEq/L (ref 3.7–5.3)
Sodium: 141 mEq/L (ref 137–147)
Total Protein: 5.3 g/dL — ABNORMAL LOW (ref 6.0–8.3)

## 2014-10-22 LAB — CBC
HEMATOCRIT: 25.4 % — AB (ref 36.0–46.0)
Hemoglobin: 8.6 g/dL — ABNORMAL LOW (ref 12.0–15.0)
MCH: 31.5 pg (ref 26.0–34.0)
MCHC: 33.9 g/dL (ref 30.0–36.0)
MCV: 93 fL (ref 78.0–100.0)
Platelets: 296 10*3/uL (ref 150–400)
RBC: 2.73 MIL/uL — ABNORMAL LOW (ref 3.87–5.11)
RDW: 13.1 % (ref 11.5–15.5)
WBC: 5.8 10*3/uL (ref 4.0–10.5)

## 2014-10-22 MED ORDER — CLINDAMYCIN HCL 300 MG PO CAPS
300.0000 mg | ORAL_CAPSULE | Freq: Three times a day (TID) | ORAL | Status: DC
  Administered 2014-10-22 – 2014-10-23 (×3): 300 mg via ORAL
  Filled 2014-10-22 (×6): qty 1

## 2014-10-22 MED ORDER — METOPROLOL SUCCINATE 12.5 MG HALF TABLET
12.5000 mg | ORAL_TABLET | Freq: Every day | ORAL | Status: DC
  Administered 2014-10-23: 12.5 mg via ORAL
  Filled 2014-10-22: qty 1

## 2014-10-22 NOTE — Plan of Care (Signed)
Problem: Phase II Progression Outcomes Goal: Pain controlled Outcome: Completed/Met Date Met:  10/22/14     

## 2014-10-22 NOTE — Progress Notes (Signed)
[facility] PROGRESS NOTE  Jill Bowman OEV:[id number] DOB: 1929-09-12 DOA: 10/19/2014 PCP: Default, Provider, MD   80 year old F female known history ventral hernia repair 2, tachybradycardia syndrome, status post pacemaker 2012, CAD with CABG 2005, atrial fibrillation on amiodarone and aspirin for anticoagulation Admitted to the hospital 10/19/14 with cellulitis to the abdominal wall. Ultimately found to have a abscess with drainage and induration. Surgery evaluated the patient was started on aztreonam clindamycin and vancomycin She was initially hypotensive and was given maintenance fluids. Underwent incision and drainage and inch of abdominal wall abscess 10/20/49  Assessment/Plan:  Recurrent ventral hernia with associated fluid collection, possibly abcess -Anterior abdominal wall with area of purulent drainage that is foul smelling. Afebrile, no leukocytosis -CT-periumbilical hernia with region of fluid collection consistent with abscess; hiatal hernia; diverticulosis -Surgery on board, rec's- I & D of abcess (11/25)  -Continue IV Vancomycin, Clindamycin, and Azactam,  on 11/25 to oral clindamycin monotherapy-will need 7 total days of by mouth antibiotics  Culture showed Some gram negatives as well as abundant gram-positive, antiemetics  Abdominal Wall Cellulitis -Presented with area in anterior abdominal wall with redness, tenderness, warmth, and purulent drainage- area of cellulitis improved. -I & D of abscess on 11/24  Hypotension -BP low  -Adjusted and metoprolol downwards to 12.5 daily XL   Hypokalemia -K 2.9--improved to 3.7 -magnesium pending -Kdur 40meq x2 doses 4 hours apart -Recheck BMET in am  Anemia of chronic disease  -also has dilutional component, Hgb 8.8 -Anemia paneshows chronic disease  -Continue folic acid daily -Monitor CBC in am  Tachy-Brady Syndrome -s/p pacemaker 2012- stable -Monitor on telemety -:  Aspirin  Hyperlipidemia -Pravastatin 80mg  daily  Constipation -Dulcolax, Senekot and colace   Hx of tibia fracture -no complaints.  Atrial Fibrillation -Rate controlled -Amiodarone 100mg  daily  -Needs internal LFTs and TSH within 2 months   CAD -Denies chest pain -ASA 81 mg daily, Metoprolol 25 mg daily  Sleep apnea -Ambien daily     DVT Prophylaxis:  SCDs Code Status: Full Family Communication: Daughter at bedside Disposition Plan: Home when stable   Consultants:  Surgery  Procedures:  11/25: Irrigation and Debridement of Abdominal wall abscess    Antibiotics:  IV Azactam, IV Zosyn, IV clindamycin (day3)  HPI/Subjective:  During furtherspecific complaints looks better seen with surgeon No abdominal pain No nausea no vomiting   Objective: Filed Vitals:   10/22/14 1347  BP: 104/38  Pulse: 58  Temp: 98.8 F (37.1 C)  Resp: 20    Intake/Output Summary (Last 24 hours) at 10/22/14 1533 Last data filed at 10/22/14 1422  Gross per 24 hour  Intake   1260 ml  Output      0 ml  Net   1260 ml   Filed Weights   10/19/14 1857 10/19/14 2110  Weight: 80.287 kg (177 lb) 83.008 kg (183 lb)    Exam:  Gen: Alert and oriented, Caucasian female in NAD HEENT: Normocephalic, atraumatic.  Pupils symmertrical. Mouth without dentures.  Moist mucosa.   Chest: clear to auscultate bilaterally, no ronchi or rales  Cardiac: Regular rate and rhythm, S1-S2, no rubs murmurs or gallops  Abdomen: soft, non tender, non distended, +bowel sounds. No guarding or rigidity  Extremities: Symmetrical in appearance without cyanosis or edema.  BLE with surgical scars Neurological: Alert awake oriented to time place and person.  Psychiatric: Appears normal.   Data Reviewed: Basic Metabolic Panel:  Recent Labs Lab 10/19/14 1447 10/20/14 0520 10/21/14 8299 10/21/14 1457 10/22/14 3716  NA 133* 137 138  --  141  K 3.4* 3.3* 2.9*  --  3.7  CL 93* 101 100  --  105  CO2 24 21  22   --  23  GLUCOSE 112* 78 109*  --  82  BUN 21 16 17   --  20  CREATININE 0.97 0.80 0.88  --  0.87  CALCIUM 9.1 8.1* 7.9*  --  7.9*  MG  --   --   --  1.4*  --    Liver Function Tests:  Recent Labs Lab 10/19/14 1447 10/20/14 0520 10/22/14 0437  AST 44* 32 19  ALT 23 16 12   ALKPHOS 137* 109 88  BILITOT 0.5 0.4 <0.2*  PROT 7.4 5.8* 5.3*  ALBUMIN 2.7* 2.0* 2.0*   No results for input(s): LIPASE, AMYLASE in the last 168 hours. No results for input(s): AMMONIA in the last 168 hours. CBC:  Recent Labs Lab 10/19/14 1447 10/20/14 0520 10/21/14 0607 10/22/14 0437  WBC 12.7* 8.5 6.9 5.8  NEUTROABS 11.0*  --   --   --   HGB 11.5* 9.7* 8.8* 8.6*  HCT 33.9* 29.0* 26.4* 25.4*  MCV 92.1 95.4 92.6 93.0  PLT 313 PLATELET CLUMPS NOTED ON SMEAR, COUNT APPEARS ADEQUATE 318 296   Cardiac Enzymes: No results for input(s): CKTOTAL, CKMB, CKMBINDEX, TROPONINI in the last 168 hours. BNP (last 3 results) No results for input(s): PROBNP in the last 8760 hours. CBG:  Recent Labs Lab 10/20/14 0825 10/20/14 1456 10/20/14 1624 10/21/14 0815  GLUCAP 101* 105* 88 88    Recent Results (from the past 240 hour(s))  Blood Culture (routine x 2)     Status: None (Preliminary result)   Collection Time: 10/19/14  5:35 PM  Result Value Ref Range Status   Specimen Description BLOOD LEFT FOREARM  Final   Special Requests BOTTLES DRAWN AEROBIC AND ANAEROBIC 5CC  Final   Culture PENDING  Incomplete   Report Status PENDING  Incomplete  Urine culture     Status: None   Collection Time: 10/19/14  5:56 PM  Result Value Ref Range Status   Specimen Description URINE, CATHETERIZED  Final   Special Requests NONE  Final   Culture  Setup Time   Final    10/20/2014 06:11 Performed at [place] Performed at [facility]   Final   Culture NO GROWTH Performed at [facility]   Final   Report Status 10/21/2014 FINAL  Final  MRSA PCR Screening      Status: Abnormal   Collection Time: 10/19/14  9:13 PM  Result Value Ref Range Status   MRSA by PCR POSITIVE (A) NEGATIVE Final    Comment:        The GeneXpert MRSA Assay (FDA approved for NASAL specimens only), is one component of a comprehensive MRSA colonization surveillance program. It is not intended to diagnose MRSA infection nor to guide or monitor treatment for MRSA infections. RESULT CALLED TO, READ BACK BY AND VERIFIED WITH: [facility] RN 0101 10/20/14 MITCHELL,L   Surgical pcr screen     Status: Abnormal   Collection Time: 10/20/14  2:27 PM  Result Value Ref Range Status   MRSA, PCR POSITIVE (A) NEGATIVE Final   Staphylococcus aureus POSITIVE (A) NEGATIVE Final    Comment:        The Xpert SA Assay (FDA approved for NASAL specimens in patients over 27 years of age), is one component of  a comprehensive surveillance program.  Test performance has been validated by [facility] for patients greater than or equal to 60 year old. It is not intended to diagnose infection nor to guide or monitor treatment.   Culture, routine-abscess     Status: None (Preliminary result)   Collection Time: 10/20/14  4:05 PM  Result Value Ref Range Status   Specimen Description ABSCESS ABDOMEN  Final   Special Requests POF VANCOMYCIN AND CLEOCIN  Final   Gram Stain   Final    ABUNDANT WBC PRESENT, PREDOMINANTLY PMN NO SQUAMOUS EPITHELIAL CELLS SEEN MODERATE GRAM POSITIVE COCCI IN PAIRS IN CHAINS Performed at [facility]    Culture   Final    FEW GRAM NEGATIVE RODS Performed at [facility]    Report Status PENDING  Incomplete  Anaerobic culture     Status: None (Preliminary result)   Collection Time: 10/20/14  4:05 PM  Result Value Ref Range Status   Specimen Description ABSCESS ABDOMEN  Final   Special Requests POF VANCOMYCIN AND CLEOCIN  Final   Gram Stain   Final    ABUNDANT WBC PRESENT, PREDOMINANTLY PMN NO SQUAMOUS EPITHELIAL CELLS SEEN ABUNDANT  GRAM POSITIVE COCCI IN PAIRS IN CHAINS Performed at [facility]    Culture   Final    NO ANAEROBES ISOLATED; CULTURE IN PROGRESS FOR 5 DAYS Performed at [facility]    Report Status PENDING  Incomplete     Studies: No results found.  Scheduled Meds: . acidophilus  1 capsule Oral Daily  . amiodarone  100 mg Oral Daily  . aspirin EC  81 mg Oral Daily  . calcium-vitamin D  1 tablet Oral Daily  . Chlorhexidine Gluconate Cloth  6 each Topical Q0600  . cholecalciferol  2,000 Units Oral Daily  . clindamycin  300 mg Oral 3 times per day  . docusate sodium  100 mg Oral BID  . feeding supplement (PRO-STAT SUGAR FREE 64)  30 mL Oral BID AC  . feeding supplement (RESOURCE BREEZE)  1 Container Oral BID WC  . folic acid  1 mg Oral Daily  . gabapentin  300 mg Oral QHS  . loratadine  10 mg Oral QHS  . [START ON 10/23/2014] metoprolol succinate  12.5 mg Oral Daily  . multivitamin with minerals  1 tablet Oral Daily  . mupirocin ointment   Nasal BID  . pantoprazole  40 mg Oral Q1200  . pravastatin  80 mg Oral q morning - 10a  . senna  1 tablet Oral QHS  . sodium chloride  3 mL Intravenous Q12H  . thiamine  100 mg Oral Daily  . vitamin C  500 mg Oral Q1200   Continuous Infusions: . sodium chloride 1,000 mL (10/22/14 1006)    Principal Problem:   Abdominal wall abscess Active Problems:   Morbid obesity   Hyperlipidemia   Hypotension   Bradycardia    Time spent: [place], [place] PA-C  [facility] Pager [phone]. If 7PM-7AM, please contact night-coverage at [email or web address], password [facility] 10/22/2014, 3:33 PM  LOS: 3 days

## 2014-10-22 NOTE — Progress Notes (Signed)
Patient states pain "5" out of "10" and requesting tylenol prn .

## 2014-10-22 NOTE — Plan of Care (Signed)
Problem: Phase I Progression Outcomes Goal: Initial discharge plan identified Outcome: Completed/Met Date Met:  10/22/14     

## 2014-10-22 NOTE — Plan of Care (Signed)
Problem: Phase I Progression Outcomes Goal: OOB as tolerated unless otherwise ordered Outcome: Completed/Met Date Met:  10/22/14

## 2014-10-22 NOTE — Plan of Care (Signed)
Problem: Phase I Progression Outcomes Goal: OOB as tolerated unless otherwise ordered Outcome: Completed/Met Date Met:  10/22/14  Problem: Phase II Progression Outcomes Goal: Vital signs remain stable Outcome: Completed/Met Date Met:  10/22/14  Problem: Phase III Progression Outcomes Goal: Foley discontinued Outcome: Not Applicable Date Met:  94/17/40

## 2014-10-22 NOTE — Plan of Care (Signed)
Problem: Phase II Progression Outcomes Goal: Progress activity as tolerated unless otherwise ordered Outcome: Completed/Met Date Met:  10/22/14     

## 2014-10-22 NOTE — Progress Notes (Signed)
ANTIBIOTIC CONSULT NOTE - FOLLOW UP  Pharmacy Consult for Clindamycin Indication: Cellulitis  Allergies  Allergen Reactions  . Codeine Itching, Rash and Other (See Comments)    Full body rash   . Hydroxyzine Other (See Comments)    Extreme confusion and hallucinations  . Lorazepam Other (See Comments)    Extreme confusion, hallucinations and hyperactivity  . Penicillins Anaphylaxis, Hives and Shortness Of Breath  . Sulfa Antibiotics Shortness Of Breath  . Zinc Itching  . Ciprofloxacin Other (See Comments)    unknown  . Ciprofloxacin Hives and Other (See Comments)    "think I break out in welts"   . Latex Rash and Other (See Comments)    Tears skin   . Penicillins Other (See Comments)    Unknown   . Sulfa Antibiotics Other (See Comments)    unknown    Patient Measurements: Height: 5\' 7"  (170.2 cm) Weight: 183 lb (83.008 kg) IBW/kg (Calculated) : 61.6  Vital Signs: Temp: 98.3 F (36.8 C) (11/26 0616) Temp Source: Oral (11/26 0616) BP: 100/40 mmHg (11/26 1001) Pulse Rate: 62 (11/26 0616) Intake/Output from previous day: 11/25 0701 - 11/26 0700 In: 120 [P.O.:120] Out: -  Intake/Output from this shift: Total I/O In: 840 [I.V.:840] Out: -   Labs:  Recent Labs  10/20/14 0520 10/21/14 0607 10/21/14 0938 10/22/14 0437  WBC 8.5 6.9  --  5.8  HGB 9.7* 8.8*  --  8.6*  PLT PLATELET CLUMPS NOTED ON SMEAR, COUNT APPEARS ADEQUATE 318  --  296  CREATININE 0.80  --  0.88 0.87   Estimated Creatinine Clearance: 54.3 mL/min (by C-G formula based on Cr of 0.87). No results for input(s): VANCOTROUGH, VANCOPEAK, VANCORANDOM, GENTTROUGH, GENTPEAK, GENTRANDOM, TOBRATROUGH, TOBRAPEAK, TOBRARND, AMIKACINPEAK, AMIKACINTROU, AMIKACIN in the last 72 hours.   Microbiology: Recent Results (from the past 720 hour(s))  Blood Culture (routine x 2)     Status: None (Preliminary result)   Collection Time: 10/19/14  5:35 PM  Result Value Ref Range Status   Specimen Description BLOOD  LEFT FOREARM  Final   Special Requests BOTTLES DRAWN AEROBIC AND ANAEROBIC 5CC  Final   Culture PENDING  Incomplete   Report Status PENDING  Incomplete  Urine culture     Status: None   Collection Time: 10/19/14  5:56 PM  Result Value Ref Range Status   Specimen Description URINE, CATHETERIZED  Final   Special Requests NONE  Final   Culture  Setup Time   Final    10/20/2014 06:11 Performed at [facility] Performed at [facility]   Final   Culture NO GROWTH Performed at [facility]   Final   Report Status 10/21/2014 FINAL  Final  MRSA PCR Screening     Status: Abnormal   Collection Time: 10/19/14  9:13 PM  Result Value Ref Range Status   MRSA by PCR POSITIVE (A) NEGATIVE Final    Comment:        The GeneXpert MRSA Assay (FDA approved for NASAL specimens only), is one component of a comprehensive MRSA colonization surveillance program. It is not intended to diagnose MRSA infection nor to guide or monitor treatment for MRSA infections. RESULT CALLED TO, READ BACK BY AND VERIFIED WITH: [facility] RN [id number] 10/20/14 MITCHELL,L   Surgical pcr screen     Status: Abnormal   Collection Time: 10/20/14  2:27 PM  Result Value Ref Range Status   MRSA, PCR POSITIVE (A) NEGATIVE Final  Staphylococcus aureus POSITIVE (A) NEGATIVE Final    Comment:        The Xpert SA Assay (FDA approved for NASAL specimens in patients over 49 years of age), is one component of a comprehensive surveillance program.  Test performance has been validated by [employer] for patients greater than or equal to 13 year old. It is not intended to diagnose infection nor to guide or monitor treatment.   Culture, routine-abscess     Status: None (Preliminary result)   Collection Time: 10/20/14  4:05 PM  Result Value Ref Range Status   Specimen Description ABSCESS ABDOMEN  Final   Special Requests POF VANCOMYCIN AND CLEOCIN  Final   Gram Stain    Final    ABUNDANT WBC PRESENT, PREDOMINANTLY PMN NO SQUAMOUS EPITHELIAL CELLS SEEN MODERATE GRAM POSITIVE COCCI IN PAIRS IN CHAINS Performed at [facility]    Culture   Final    FEW GRAM NEGATIVE RODS Performed at [facility]    Report Status PENDING  Incomplete  Anaerobic culture     Status: None (Preliminary result)   Collection Time: 10/20/14  4:05 PM  Result Value Ref Range Status   Specimen Description ABSCESS ABDOMEN  Final   Special Requests POF VANCOMYCIN AND CLEOCIN  Final   Gram Stain   Final    ABUNDANT WBC PRESENT, PREDOMINANTLY PMN NO SQUAMOUS EPITHELIAL CELLS SEEN ABUNDANT GRAM POSITIVE COCCI IN PAIRS IN CHAINS Performed at [facility]    Culture   Final    NO ANAEROBES ISOLATED; CULTURE IN PROGRESS FOR 5 DAYS Performed at [facility]    Report Status PENDING  Incomplete    Anti-infectives    Start     Dose/Rate Route Frequency Ordered Stop   10/22/14 1200  clindamycin (CLEOCIN) capsule 300 mg     300 mg Oral 3 times per day 10/22/14 1027     10/20/14 1800  vancomycin (VANCOCIN) IVPB 1000 mg/200 mL premix  Status:  Discontinued     1,000 mg200 mL/hr over 60 Minutes Intravenous Every 24 hours 10/19/14 1902 10/22/14 1026   10/19/14 1930  aztreonam (AZACTAM) 1 g in dextrose 5 % 50 mL IVPB  Status:  Discontinued     1 g100 mL/hr over 30 Minutes Intravenous Every 8 hours 10/19/14 1902 10/22/14 1026   10/19/14 1930  clindamycin (CLEOCIN) IVPB 600 mg  Status:  Discontinued     600 mg100 mL/hr over 30 Minutes Intravenous Every 8 hours 10/19/14 1902 10/21/14 1404   10/19/14 1630  vancomycin (VANCOCIN) IVPB 1000 mg/200 mL premix     1,000 mg200 mL/hr over 60 Minutes Intravenous  Once 10/19/14 1629 10/19/14 1856     Assessment: Jill Bowman is an 80 y.o. female admitted on 10/19/2014 presenting with abdominal pain resulting from large subcutaneous ventral abdominal abscess.  Patient was started on broad spectrum antibiotics on  11/23 with clindamycin, vancomycin, and aztreonam.  Patient is afebrile, WBC wnl.  This is day #4 of antibiotics.  Vancomycin and aztreonam have been discontinued.    Plan:  1. D/C Aztreonam 1gm IV Q8H, Vanc 1000mg  IV Q24H 2. Continue Clinda 300 mg PO Q8H 3. F/u renal fxn, C&S, clinical status  Hassie Bruce, Pharm. D. Clinical Pharmacy Resident Pager: [phone] Ph: [phone] 10/22/2014 1:11 PM

## 2014-10-22 NOTE — Plan of Care (Signed)
Problem: Phase I Progression Outcomes Goal: Sutures/staples intact Outcome: Not Applicable Date Met:  10/12/51

## 2014-10-22 NOTE — Progress Notes (Signed)
[facility] Progress Note  2 Days Post-Op  Subjective: Pt denies pain.  Dressing changes going well.  Daughter at bedside says she "can't help with dressing changes" due to being squeamish.  Says granddaughter may be able to help.  No N/V.  Tolerating diet.    Objective: Vital signs in last 24 hours: Temp:  [98.3 F (36.8 C)-99.2 F (37.3 C)] 98.3 F (36.8 C) (11/26 0616) Pulse Rate:  [61-62] 62 (11/26 0616) Resp:  [16-20] 16 (11/26 0616) BP: (93-101)/(36-46) 95/42 mmHg (11/26 0616) SpO2:  [99 %-100 %] 100 % (11/26 0616) Last BM Date: 10/21/14  Intake/Output from previous day: 11/25 0701 - 11/26 0700 In: 120 [P.O.:120] Out: -  Intake/Output this shift:    PE: Gen:  Alert, NAD, pleasant Abd: Obese, soft, NT/ND, +BS, no HSM, 4inch open wound with cavity which is approximately 10cm x 8cm.  A whole bottle of 1 inch packing strip can fit in this cavity.  Erythema/induration improving around incision site.  Less tender.   Lab Results:   Recent Labs  10/21/14 0607 10/22/14 0437  WBC 6.9 5.8  HGB 8.8* 8.6*  HCT 26.4* 25.4*  PLT 318 296   BMET  Recent Labs  10/21/14 0938 10/22/14 0437  NA 138 141  K 2.9* 3.7  CL 100 105  CO2 22 23  GLUCOSE 109* 82  BUN 17 20  CREATININE 0.88 0.87  CALCIUM 7.9* 7.9*   PT/INR  Recent Labs  10/22/14 0437  LABPROT 15.8*  INR 1.25   CMP     Component Value Date/Time   NA 141 10/22/2014 0437   K 3.7 10/22/2014 0437   CL 105 10/22/2014 0437   CO2 23 10/22/2014 0437   GLUCOSE 82 10/22/2014 0437   BUN 20 10/22/2014 0437   CREATININE 0.87 10/22/2014 0437   CALCIUM 7.9* 10/22/2014 0437   CALCIUM 8.2* 09/11/2012 0555   PROT 5.3* 10/22/2014 0437   ALBUMIN 2.0* 10/22/2014 0437   AST 19 10/22/2014 0437   ALT 12 10/22/2014 0437   ALKPHOS 88 10/22/2014 0437   BILITOT <0.2* 10/22/2014 0437   GFRNONAA 60* 10/22/2014 0437   GFRAA 69* 10/22/2014 0437   Lipase     Component Value Date/Time   LIPASE 48 08/16/2012  1920       Studies/Results: No results found.  Anti-infectives: Anti-infectives    Start     Dose/Rate Route Frequency Ordered Stop   10/20/14 1800  vancomycin (VANCOCIN) IVPB 1000 mg/200 mL premix     1,000 mg200 mL/hr over 60 Minutes Intravenous Every 24 hours 10/19/14 1902     10/19/14 1930  aztreonam (AZACTAM) 1 g in dextrose 5 % 50 mL IVPB     1 g100 mL/hr over 30 Minutes Intravenous Every 8 hours 10/19/14 1902     10/19/14 1930  clindamycin (CLEOCIN) IVPB 600 mg  Status:  Discontinued     600 mg100 mL/hr over 30 Minutes Intravenous Every 8 hours 10/19/14 1902 10/21/14 1404   10/19/14 1630  vancomycin (VANCOCIN) IVPB 1000 mg/200 mL premix     1,000 mg200 mL/hr over 60 Minutes Intravenous  Once 10/19/14 1629 10/19/14 1856       Assessment/Plan Abdominal wall abscess POD #2 s/p I&D Rosendo Gros)  Plan: 1.  Tolerating dressing changes well with 1 inch iodoform gauze (one bottle), no leukocytosis or fever 2.  Ambulate and IS - PT/OT? 3.  Continue antibiotics - 7 days 4.  Micro shows - gram pos cocci in chains and  pairs and gram neg rods, culture final pending 5.  Needs gram neg coverage (on Aztreonam and vanco) 6.  Okay to d/c from our perspective, f/u with Dr. Rosendo Gros in 2 weeks, may need wound center management 7.  Needs HH nursing unless family member can be trained.  Would recommend 1 inch iodoform gauze packing strips (she's currently needing the entire bottle in the wound) twice daily at discharge     LOS: 3 days    DORT, [facility] 10/22/2014, 9:09 AM Pager: [phone]  Wound examined.  OK to discharge with BID dressing changes if possible.  Would use 1" iodophor as this would make it easier for the caregiver to complete.

## 2014-10-23 LAB — FERRITIN: FERRITIN: 552 ng/mL — AB (ref 10–291)

## 2014-10-23 LAB — VITAMIN B12: Vitamin B-12: 865 pg/mL (ref 211–911)

## 2014-10-23 MED ORDER — RISAQUAD PO CAPS: 1.0000 | ORAL_CAPSULE | Freq: Every day | ORAL | Status: DC

## 2014-10-23 MED ORDER — CEPHALEXIN 500 MG PO CAPS
500.0000 mg | ORAL_CAPSULE | Freq: Four times a day (QID) | ORAL | Status: DC
Start: 2014-10-23 — End: 2015-03-30

## 2014-10-23 MED ORDER — IODINE 2-2.4 % EX TINC: Freq: Once | CUTANEOUS | Status: DC

## 2014-10-23 MED ORDER — CURITY IODOFORM PACKING STRIP MISC: 1.0000 | Freq: Every day | Status: DC

## 2014-10-23 MED ORDER — OXYCODONE HCL 5 MG PO TABS: 5.0000 mg | ORAL_TABLET | ORAL | Status: DC | PRN

## 2014-10-23 MED ORDER — METOPROLOL SUCCINATE ER 25 MG PO TB24: 12.5000 mg | ORAL_TABLET | Freq: Every day | ORAL | Status: DC

## 2014-10-23 MED ORDER — CLINDAMYCIN HCL 300 MG PO CAPS: 300.0000 mg | ORAL_CAPSULE | Freq: Three times a day (TID) | ORAL | Status: DC

## 2014-10-23 NOTE — Plan of Care (Signed)
Problem: Consults Goal: General Medical Patient Education See Patient Education Module for specific education.  Outcome: Completed/Met Date Met:  10/23/14 Goal: Skin Care Protocol Initiated - if Braden Score 18 or less If consults are not indicated, leave blank or document N/A  Outcome: Not Applicable Date Met:  37/35/78 Goal: Nutrition Consult-if indicated Outcome: Completed/Met Date Met:  10/23/14 Goal: Diabetes Guidelines if Diabetic/Glucose > 140 If diabetic or lab glucose is > 140 mg/dl - Initiate Diabetes/Hyperglycemia Guidelines & Document Interventions  Outcome: Not Applicable Date Met:  97/84/78

## 2014-10-23 NOTE — Plan of Care (Signed)
Problem: Phase I Progression Outcomes Goal: Other Phase I Outcomes/Goals Outcome: Not Applicable Date Met:  81/85/90  Problem: Phase II Progression Outcomes Goal: Discharge plan established Outcome: Completed/Met Date Met:  10/23/14 Goal: IV changed to normal saline lock Outcome: Completed/Met Date Met:  10/23/14 Goal: Obtain order to discontinue catheter if appropriate Outcome: Not Applicable Date Met:  93/11/21 Goal: Other Phase II Outcomes/Goals Outcome: Not Applicable Date Met:  62/44/69  Problem: Phase III Progression Outcomes Goal: Activity at appropriate level-compared to baseline (UP IN CHAIR FOR HEMODIALYSIS)  Outcome: Completed/Met Date Met:  10/23/14 Goal: IV/normal saline lock discontinued Outcome: Completed/Met Date Met:  10/23/14 Goal: Discharge plan remains appropriate-arrangements made Outcome: Completed/Met Date Met:  10/23/14 Goal: Other Phase III Outcomes/Goals Outcome: Not Applicable Date Met:  50/72/25  Problem: Discharge Progression Outcomes Goal: Discharge plan in place and appropriate Outcome: Completed/Met Date Met:  10/23/14 Goal: Pain controlled with appropriate interventions Outcome: Completed/Met Date Met:  10/23/14 Goal: Hemodynamically stable Outcome: Completed/Met Date Met:  75/05/18 Goal: Complications resolved/controlled Outcome: Not Applicable Date Met:  33/58/25 Goal: Tolerating diet Outcome: Completed/Met Date Met:  10/23/14 Goal: Activity appropriate for discharge plan Outcome: Completed/Met Date Met:  10/23/14 Goal: Other Discharge Outcomes/Goals Outcome: Not Applicable Date Met:  18/98/42

## 2014-10-23 NOTE — Progress Notes (Signed)
Patient was discharged home with home health by MD order; discharged instructions  review and give to patient and her daughter with care notes and prescriptions; IV DIC;  patient will be escorted to the car by nurse tech via wheelchair.

## 2014-10-23 NOTE — Discharge Summary (Addendum)
Physician Discharge Summary  Jill Bowman EPP:[id number] DOB: June 08, 1929 DOA: 10/19/2014  PCP: Default, Provider, MD  Admit date: 10/19/2014 Discharge date: 10/23/2014  Time spent: 35 minutes  Recommendations for Outpatient Follow-up:  1. Rpt CBc and Bmet in 1 week 2. Complete oral keflex on 10/29/14-follow final cultures 3.  Wound needs packing with Iodophore until seen by surgeon 4. Home health has been ordered for help with this 5. Consider interval LFT and TSH in 1 mo as on Amiodarone and apporp f/u with Cardiology 6. Monitor blood pressures as was low normal in hosptial-have changed multiple meds around   Discharge Diagnoses:  Principal Problem:   Abdominal wall abscess Active Problems:   Morbid obesity   Hyperlipidemia   Hypotension   Bradycardia   Discharge Condition: good  Diet recommendation: hh low salt  Filed Weights   10/19/14 1857 10/19/14 2110  Weight: 80.287 kg (177 lb) 83.008 kg (183 lb)    History of present illness:  80 year old F female known history ventral hernia repair 2, tachybradycardia syndrome, status post pacemaker 2012, CAD with CABG 2005, atrial fibrillation on amiodarone and aspirin for anticoagulation Admitted to the hospital 10/19/14 with cellulitis to the abdominal wall. Ultimately found to have a abscess with drainage and induration. Surgery evaluated the patient was started on aztreonam clindamycin and vancomycin She was initially hypotensive and was given maintenance fluids. Underwent incision and drainage and inch of abdominal wall abscess 10/20/49  Hospital Course:   Recurrent ventral hernia with associated fluid collection, possibly abcess  -CT-periumbilical hernia on admission with region of fluid collection consistent with abscess; hiatal hernia; diverticulosis -Surgery on board, s/p I & D of abcess (11/25) -Continue IV Vancomycin, Clindamycin, and Azactam,  on 11/25 to oral clindamycin monotherapy-will need 7 total days  of by mouth antibiotics, ending 10/29/14.  PAtient afebrile and wound clean appearing on my exam 11/26 Culture showed e.coli and staph so changed to Keflex qid  Abdominal Wall Cellulitis -Presented with area in anterior abdominal wall with redness, tenderness, warmth, and purulent drainage- area of cellulitis improved. -I & D of abscess on 11/24  Hypotension -BP low but asymtpomatic  -d/c HCTZ -Adjusted and metoprolol downwards to 12.5 daily XL   Hypokalemia -K 2.9--improved to 3.7g -Kdur 3meq x2 doses 4 hours apart -Recheck BMET in 1 week  Anemia of chronic disease  -also has dilutional component, Hgb 8.8 -Anemia pane shows chronic disease  -Continue folic acid daily -Monitor CBC 1 week  Tachy-Brady Syndrome -s/p pacemaker 2012- stable -Monitor on telemety - Aspirin 81 mg daily for 2/2 prevention  Hyperlipidemia -Pravastatin 80mg  daily  Constipation -Dulcolax, Senekot and colace   Hx of tibia fracture -no complaints.  Atrial Fibrillation -Rate controlled -Amiodarone 100mg  daily  -Needs internal LFTs and TSH within 2 months   CAD -Denies chest pain -ASA 81 mg daily, Metoprolol 25 mg daily  Sleep apnea -Ambien daily    Procedures:  I & D to abdominal wall 11/25  Consultations:  Gen Surgery  Discharge Exam: Filed Vitals:   10/23/14 0640  BP: 110/46  Pulse: 58  Temp: 97.9 F (36.6 C)  Resp: 14   Alert pleasant oriented in nad General:  EOMI, NCAT  Cardiovascular: s 1s 2no m/r/g Respiratory: clear no added sound  Discharge Instructions You were cared for by a hospitalist during your hospital stay. If you have any questions about your discharge medications or the care you received while you were in the hospital after you are discharged, you can call the  unit and asked to speak with the hospitalist on call if the hospitalist that took care of you is not available. Once you are discharged, your primary care physician will handle any further medical  issues. Please note that NO REFILLS for any discharge medications will be authorized once you are discharged, as it is imperative that you return to your primary care physician (or establish a relationship with a primary care physician if you do not have one) for your aftercare needs so that they can reassess your need for medications and monitor your lab values.  Discharge Instructions    Diet - low sodium heart healthy    Complete by:  As directed      Discharge instructions    Complete by:  As directed   Complete course of oral antibiotics on 10/29/14 We have cut back on some of your blood pressure medicines-specifically cut back your metoprolol from 25 mg-->12.5.  So take a lower dose.  Stop your HCTZ completely I have prescribed a limited amount of oxycodone for pain-try OTC Tylenol or Ibuprofen as a first choice and if pain still above 5/10, use oxycodone Get labwork done within a week at your PCP's office     Increase activity slowly    Complete by:  As directed           Current Discharge Medication List    START taking these medications   Details  acidophilus (RISAQUAD) CAPS capsule Take 1 capsule by mouth daily. Qty: 30 capsule, Refills: 0    cephALEXin (KEFLEX) 500 MG capsule Take 1 capsule (500 mg total) by mouth 4 (four) times daily. Qty: 24 capsule, Refills: 0    Gauze Pads & Dressings (CURITY IODOFORM PACKING STRIP) MISC 1 Package by Does not apply route daily. Qty: 30 each, Refills: 0    iodine-sodium iodide 2-2.4 % solution Apply topically once. Clean are well with this and cotton swab prior to packing area Qty: 30 mL, Refills: 12    oxyCODONE (OXY IR/ROXICODONE) 5 MG immediate release tablet Take 1 tablet (5 mg total) by mouth every 4 (four) hours as needed for moderate pain. Qty: 30 tablet, Refills: 0      CONTINUE these medications which have CHANGED   Details  metoprolol succinate (TOPROL-XL) 25 MG 24 hr tablet Take 0.5 tablets (12.5 mg total) by mouth daily.       CONTINUE these medications which have NOT CHANGED   Details  acetaminophen (TYLENOL) 500 MG tablet Take 500 mg by mouth every 4 (four) hours as needed for pain.    amiodarone (PACERONE) 200 MG tablet Take 100 mg by mouth daily.    aspirin 81 MG tablet Take 81 mg by mouth daily.    Calcium Carb-Cholecalciferol (CALCIUM 600+D3) 600-800 MG-UNIT TABS Take 1 tablet by mouth daily.    gabapentin (NEURONTIN) 600 MG tablet Take 300 mg by mouth at bedtime.    Lactobacillus (ACIDOPHILUS) TABS Take 1 tablet by mouth daily.    loratadine (CLARITIN) 10 MG tablet Take 10 mg by mouth at bedtime.     LORazepam (ATIVAN) 1 MG tablet Take 0.5 tablets (0.5 mg total) by mouth every 8 (eight) hours as needed for anxiety. Qty: 30 tablet, Refills: 0    Multiple Vitamin (MULTIVITAMIN WITH MINERALS) TABS Take 1 tablet by mouth daily.    pantoprazole (PROTONIX) 40 MG tablet Take 1 tablet (40 mg total) by mouth daily at 12 noon.    pravastatin (PRAVACHOL) 80 MG tablet Take 80 mg  by mouth every morning.     Sennosides 8.6 MG CAPS Take 1 capsule by mouth at bedtime.    sertraline (ZOLOFT) 50 MG tablet Take 50 mg by mouth daily.    vitamin C (ASCORBIC ACID) 500 MG tablet Take 500 mg by mouth daily at 12 noon.     docusate sodium 100 MG CAPS Take 100 mg by mouth 2 (two) times daily. Qty: 10 capsule    traMADol (ULTRAM) 50 MG tablet Take 2 tablets (100 mg total) by mouth every 6 (six) hours as needed. Pain Qty: 30 tablet, Refills: 0      STOP taking these medications     cholecalciferol (VITAMIN D) 1000 UNITS tablet      hydrochlorothiazide (HYDRODIURIL) 25 MG tablet      aspirin EC 325 MG tablet      calcium citrate (CALCITRATE - DOSED IN MG ELEMENTAL CALCIUM) 950 MG tablet      feeding supplement (RESOURCE BREEZE) LIQD      HYDROcodone-acetaminophen (NORCO) 7.5-325 MG per tablet        Allergies  Allergen Reactions  . Codeine Itching, Rash and Other (See Comments)    Full body rash    . Hydroxyzine Other (See Comments)    Extreme confusion and hallucinations  . Lorazepam Other (See Comments)    Extreme confusion, hallucinations and hyperactivity  . Penicillins Anaphylaxis, Hives and Shortness Of Breath  . Sulfa Antibiotics Shortness Of Breath  . Zinc Itching  . Ciprofloxacin Other (See Comments)    unknown  . Ciprofloxacin Hives and Other (See Comments)    "think I break out in welts"   . Latex Rash and Other (See Comments)    Tears skin   . Penicillins Other (See Comments)    Unknown   . Sulfa Antibiotics Other (See Comments)    unknown      The results of significant diagnostics from this hospitalization (including imaging, microbiology, ancillary and laboratory) are listed below for reference.    Significant Diagnostic Studies: Dg Chest 2 View  10/19/2014   CLINICAL DATA:  Abdominal pain for 2 months due to hernia, past history of hernia surgery, CHF, diabetes, asthma, former smoker  EXAM: CHEST  2 VIEW  COMPARISON:  09/30/2013, 05/02/2013 ; correlation CT chest 12/25/2012  FINDINGS: LEFT subclavian sequential transvenous pacemaker leads project at RIGHT atrium and RIGHT ventricle.  Normal heart size, mediastinal contours, and pulmonary vascularity.  Atherosclerotic calcification aorta.  Prominent RIGHT first costochondral junction unchanged since prior CT.  No acute infiltrate, pleural effusion or pneumothorax.  Bones demineralized with scattered endplate spur formation and biconvex scoliosis of the thoracolumbar spine.  IMPRESSION: No acute abnormalities.   Electronically Signed   By: Lavonia Dana M.D.   On: 10/19/2014 17:33   Ct Abdomen Pelvis W Contrast  10/19/2014   CLINICAL DATA:  Periumbilical abdominal pain.  EXAM: CT ABDOMEN AND PELVIS WITH CONTRAST  TECHNIQUE: Multidetector CT imaging of the abdomen and pelvis was performed using the standard protocol following bolus administration of intravenous contrast.  CONTRAST:  153mL OMNIPAQUE IOHEXOL 300 MG/ML   SOLN  COMPARISON:  CT scan of February 06, 2014.  FINDINGS: Severe multilevel degenerative disc disease is noted in the lumbar spine. Visualized lung bases appear normal.  Status post cholecystectomy. Moderate size hiatal hernia is noted. The spleen and pancreas appear normal. Adrenal glands and kidneys appear normal. No hydronephrosis or evidence of renal obstruction is noted. The appendix appears normal. Diverticulosis of descending and sigmoid colon  is noted without inflammation. Moderate size periumbilical hernia is noted which contains a loop of transverse colon without evidence of incarceration or obstruction. However, large fluid collection containing gas is noted in the periumbilical region of the subcutaneous tissues which measures 10.4 x 7.3 x 6.0 cm. Atherosclerotic calcifications of abdominal aorta are noted without aneurysm formation. Urinary bladder appears normal.  IMPRESSION: Moderate size sliding-type hiatal hernia is noted.  Diverticulosis of descending and sigmoid colon is noted without inflammation.  Moderate size periumbilical hernia is noted which contains a loop of transverse colon without evidence of obstruction or incarceration.  Immediately adjacent to this hernia in the subcutaneous tissues of the periumbilical region, is a 41.7 x 7.3 x 6.0 cm fluid collection containing gas consistent with abscess.   Electronically Signed   By: Sabino Dick M.D.   On: 10/19/2014 16:51    Microbiology: Recent Results (from the past 240 hour(s))  Blood Culture (routine x 2)     Status: None (Preliminary result)   Collection Time: 10/19/14  5:35 PM  Result Value Ref Range Status   Specimen Description BLOOD LEFT FOREARM  Final   Special Requests BOTTLES DRAWN AEROBIC AND ANAEROBIC 5CC  Final   Culture PENDING  Incomplete   Report Status PENDING  Incomplete  Urine culture     Status: None   Collection Time: 10/19/14  5:56 PM  Result Value Ref Range Status   Specimen Description URINE, CATHETERIZED   Final   Special Requests NONE  Final   Culture  Setup Time   Final    10/20/2014 06:11 Performed at [place] Performed at [facility]   Final   Culture NO GROWTH Performed at [facility]   Final   Report Status 10/21/2014 FINAL  Final  MRSA PCR Screening     Status: Abnormal   Collection Time: 10/19/14  9:13 PM  Result Value Ref Range Status   MRSA by PCR POSITIVE (A) NEGATIVE Final    Comment:        The GeneXpert MRSA Assay (FDA approved for NASAL specimens only), is one component of a comprehensive MRSA colonization surveillance program. It is not intended to diagnose MRSA infection nor to guide or monitor treatment for MRSA infections. RESULT CALLED TO, READ BACK BY AND VERIFIED WITH: [facility] RN [id number] 10/20/14 MITCHELL,L   Surgical pcr screen     Status: Abnormal   Collection Time: 10/20/14  2:27 PM  Result Value Ref Range Status   MRSA, PCR POSITIVE (A) NEGATIVE Final   Staphylococcus aureus POSITIVE (A) NEGATIVE Final    Comment:        The Xpert SA Assay (FDA approved for NASAL specimens in patients over 20 years of age), is one component of a comprehensive surveillance program.  Test performance has been validated by [employer] for patients greater than or equal to 90 year old. It is not intended to diagnose infection nor to guide or monitor treatment.   Culture, routine-abscess     Status: None (Preliminary result)   Collection Time: 10/20/14  4:05 PM  Result Value Ref Range Status   Specimen Description ABSCESS ABDOMEN  Final   Special Requests POF VANCOMYCIN AND CLEOCIN  Final   Gram Stain   Final    ABUNDANT WBC PRESENT, PREDOMINANTLY PMN NO SQUAMOUS EPITHELIAL CELLS SEEN MODERATE GRAM POSITIVE COCCI IN PAIRS IN CHAINS Performed at [facility]    Culture   Final  FEW ESCHERICHIA COLI Performed at [facility]    Report Status PENDING  Incomplete   Organism ID,  Bacteria ESCHERICHIA COLI  Final      Susceptibility   Escherichia coli - MIC*    AMPICILLIN >=32 RESISTANT Resistant     AMPICILLIN/SULBACTAM 8 SENSITIVE Sensitive     CEFAZOLIN <=4 SENSITIVE Sensitive     CEFEPIME <=1 SENSITIVE Sensitive     CEFTAZIDIME <=1 SENSITIVE Sensitive     CEFTRIAXONE <=1 SENSITIVE Sensitive     CIPROFLOXACIN <=0.25 SENSITIVE Sensitive     GENTAMICIN <=1 SENSITIVE Sensitive     IMIPENEM <=0.25 SENSITIVE Sensitive     PIP/TAZO <=4 SENSITIVE Sensitive     TOBRAMYCIN <=1 SENSITIVE Sensitive     TRIMETH/SULFA <=20 SENSITIVE Sensitive     * FEW ESCHERICHIA COLI  Anaerobic culture     Status: None (Preliminary result)   Collection Time: 10/20/14  4:05 PM  Result Value Ref Range Status   Specimen Description ABSCESS ABDOMEN  Final   Special Requests POF VANCOMYCIN AND CLEOCIN  Final   Gram Stain   Final    ABUNDANT WBC PRESENT, PREDOMINANTLY PMN NO SQUAMOUS EPITHELIAL CELLS SEEN ABUNDANT GRAM POSITIVE COCCI IN PAIRS IN CHAINS Performed at [facility]    Culture   Final    NO ANAEROBES ISOLATED; CULTURE IN PROGRESS FOR 5 DAYS Performed at [facility]    Report Status PENDING  Incomplete     Labs: Basic Metabolic Panel:  Recent Labs Lab 10/19/14 1447 10/20/14 0520 10/21/14 0938 10/21/14 1457 10/22/14 0437  NA 133* 137 138  --  141  K 3.4* 3.3* 2.9*  --  3.7  CL 93* 101 100  --  105  CO2 24 21 22   --  23  GLUCOSE 112* 78 109*  --  82  BUN 21 16 17   --  20  CREATININE 0.97 0.80 0.88  --  0.87  CALCIUM 9.1 8.1* 7.9*  --  7.9*  MG  --   --   --  1.4*  --    Liver Function Tests:  Recent Labs Lab 10/19/14 1447 10/20/14 0520 10/22/14 0437  AST 44* 32 19  ALT 23 16 12   ALKPHOS 137* 109 88  BILITOT 0.5 0.4 <0.2*  PROT 7.4 5.8* 5.3*  ALBUMIN 2.7* 2.0* 2.0*   No results for input(s): LIPASE, AMYLASE in the last 168 hours. No results for input(s): AMMONIA in the last 168 hours. CBC:  Recent Labs Lab 10/19/14 1447  10/20/14 0520 10/21/14 0607 10/22/14 0437  WBC 12.7* 8.5 6.9 5.8  NEUTROABS 11.0*  --   --   --   HGB 11.5* 9.7* 8.8* 8.6*  HCT 33.9* 29.0* 26.4* 25.4*  MCV 92.1 95.4 92.6 93.0  PLT 313 PLATELET CLUMPS NOTED ON SMEAR, COUNT APPEARS ADEQUATE 318 296   Cardiac Enzymes: No results for input(s): CKTOTAL, CKMB, CKMBINDEX, TROPONINI in the last 168 hours. BNP: BNP (last 3 results) No results for input(s): PROBNP in the last 8760 hours. CBG:  Recent Labs Lab 10/20/14 0825 10/20/14 1456 10/20/14 1624 10/21/14 0815  GLUCAP 101* 105* 88 88       Signed:  Shaheer Bonfield, JAI-GURMUKH  [facility] 10/23/2014, 7:59 AM

## 2014-10-23 NOTE — Plan of Care (Signed)
Problem: Phase II Progression Outcomes Goal: Progress activity as tolerated unless otherwise ordered Outcome: Progressing Patient up to bedside commode with 1 assist.

## 2014-10-23 NOTE — Progress Notes (Signed)
Physical Therapy Evaluation Patient Details Name: Jill Bowman MRN: [id number] DOB: 06/11/1929 Today's Date: 10/23/2014   History of Present Illness  Patient is an 80 yo female admitted 10/19/14 with abdominal pain - abdominal wall abscess.  Patient s/p I&D on 10/20/14.  PMH:  HTN, CAD, asthma, tachy/brady, s/p pacemaker placement, DM, bil TKA, mult LE fx's 2013.  Clinical Impression  Patient presents with problems listed below.  Will benefit from acute PT to maximize independence prior to discharge.  Recommend HHPT f/u.    Follow Up Recommendations Home health PT;Supervision/Assistance - 24 hour    Equipment Recommendations  None recommended by PT    Recommendations for Other Services       Precautions / Restrictions Precautions Precautions: Fall Restrictions Weight Bearing Restrictions: No      Mobility  Bed Mobility Overal bed mobility: Modified Independent             General bed mobility comments: Patient able to move sit > supine without assist.  Requires increased time.  Able to scoot to [facility] with use of rail.  Transfers Overall transfer level: Needs assistance Equipment used: Rolling walker (2 wheeled) Transfers: Sit to/from Stand Sit to Stand: Min guard         General transfer comment: Verbal cues for hand placement.  Assist for balance/safety only.  Ambulation/Gait Ambulation/Gait assistance: Min guard Ambulation Distance (Feet): 100 Feet Assistive device: Rolling walker (2 wheeled) Gait Pattern/deviations: Step-through pattern;Decreased stride length Gait velocity: Decreased Gait velocity interpretation: Below normal speed for age/gender General Gait Details: Verbal cues for safe use of RW.  Patient with short steps and decreased gait speed.  Good balance with use of RW.  Fatigues quickly.  Stairs            Wheelchair Mobility    Modified Rankin (Stroke Patients Only)       Balance Overall balance assessment: Needs assistance          Standing balance support: Bilateral upper extremity supported Standing balance-Leahy Scale: Poor                               Pertinent Vitals/Pain Pain Assessment: Faces Faces Pain Scale: Hurts little more Pain Location: Abdomen and legs Pain Descriptors / Indicators: Sore Pain Intervention(s): Monitored during session    Home Living Family/patient expects to be discharged to:: Private residence Living Arrangements: Children (Pt reports she lives in [place].?? Will go to daughter's at dc) Available Help at Discharge: Family;Available 24 hours/day Type of Home: House Home Access: Stairs to enter Entrance Stairs-Rails: Psychiatric nurse of Steps: 3-4 Home Layout: One level Home Equipment: Walker - 2 wheels;Bedside commode;Shower seat      Prior Function Level of Independence: Independent with assistive device(s)         Comments: Per patient, she did not need any assist with ADL's pta.  Unsure of accuracy of information.     Hand Dominance        Extremity/Trunk Assessment   Upper Extremity Assessment: Generalized weakness           Lower Extremity Assessment: Generalized weakness         Communication   Communication: No difficulties  Cognition Arousal/Alertness: Awake/alert Behavior During Therapy: WFL for tasks assessed/performed Overall Cognitive Status: No family/caregiver present to determine baseline cognitive functioning Area of Impairment: Orientation Orientation Level: Disoriented to;Time;Situation   Memory: Decreased short-term memory  General Comments: Patient reports she has not had any procedures since she has been in the hospital this time (has had I&D).      General Comments General comments (skin integrity, edema, etc.): Wound on abdomen    Exercises        Assessment/Plan    PT Assessment Patient needs continued PT services  PT Diagnosis Difficulty walking;Generalized  weakness;Acute pain   PT Problem List Decreased strength;Decreased activity tolerance;Decreased balance;Decreased mobility;Decreased knowledge of use of DME;Pain;Decreased skin integrity  PT Treatment Interventions DME instruction;Gait training;Functional mobility training;Therapeutic activities;Therapeutic exercise;Patient/family education   PT Goals (Current goals can be found in the Care Plan section) Acute Rehab PT Goals Patient Stated Goal: To go home soon PT Goal Formulation: With patient Time For Goal Achievement: 10/30/14 Potential to Achieve Goals: Good    Frequency Min 3X/week   Barriers to discharge        Co-evaluation               End of Session Equipment Utilized During Treatment: Gait belt Activity Tolerance: Patient limited by fatigue Patient left: in bed;with call bell/phone within reach;with bed alarm set Nurse Communication: Mobility status         Time: 5852-7782 PT Time Calculation (min) (ACUTE ONLY): 18 min   Charges:   PT Evaluation $Initial PT Evaluation Tier I: 1 Procedure PT Treatments $Gait Training: 8-22 mins   PT G Codes:          Despina Pole 10/23/2014, 10:10 AM Carita Pian. Sanjuana Kava, [place] Pager [phone]

## 2014-10-23 NOTE — Discharge Instructions (Signed)
Dressing Change °A dressing is a material placed over wounds. It keeps the wound clean, dry, and protected from further injury. This provides an environment that favors wound healing.  °BEFORE YOU BEGIN °· Get your supplies together. Things you may need include: °¨ Saline solution. °¨ Flexible gauze dressing. °¨ Medicated cream. °¨ Tape. °¨ Gloves. °¨ Abdominal dressing pads. °¨ Gauze squares. °¨ Plastic bags. °· Take pain medicine 30 minutes before the dressing change if you need it. °· Take a shower before you do the first dressing change of the day. Use plastic wrap or a plastic bag to prevent the dressing from getting wet. °REMOVING YOUR OLD DRESSING  °· Wash your hands with soap and water. Dry your hands with a clean towel. °· Put on your gloves. °· Remove any tape. °· Carefully remove the old dressing. If the dressing sticks, you may dampen it with warm water to loosen it, or follow your caregiver's specific directions. °· Remove any gauze or packing tape that is in your wound. °· Take off your gloves. °· Put the gloves, tape, gauze, or any packing tape into a plastic bag. °CHANGING YOUR DRESSING °· Open the supplies. °· Take the cap off the saline solution. °· Open the gauze package so that the gauze remains on the inside of the package. °· Put on your gloves. °· Clean your wound as told by your caregiver. °· If you have been told to keep your wound dry, follow those instructions. °· Your caregiver may tell you to do one or more of the following: °¨ Pick up the gauze. Pour the saline solution over the gauze. Squeeze out the extra saline solution. °¨ Put medicated cream or other medicine on your wound if you have been told to do so. °¨ Put the solution soaked gauze only in your wound, not on the skin around it. °¨ Pack your wound loosely or as told by your caregiver. °¨ Put dry gauze on your wound. °¨ Put abdominal dressing pads over the dry gauze if your wet gauze soaks through. °· Tape the abdominal dressing  pads in place so they will not fall off. Do not wrap the tape completely around the affected part (arm, leg, abdomen). °· Wrap the dressing pads with a flexible gauze dressing to secure it in place. °· Take off your gloves. Put them in the plastic bag with the old dressing. Tie the bag shut and throw it away. °· Keep the dressing clean and dry until your next dressing change. °· Wash your hands. °SEEK MEDICAL CARE IF: °· Your skin around the wound looks red. °· Your wound feels more tender or sore. °· You see pus in the wound. °· Your wound smells bad. °· You have a fever. °· Your skin around the wound has a rash that itches and burns. °· You see black or yellow skin in your wound that was not there before. °· You feel nauseous, throw up, and feel very tired. °Document Released: 12/21/2004 Document Revised: 02/05/2012 Document Reviewed: 09/25/2011 °ExitCare® Patient Information ©2015 [employer]. This information is not intended to replace advice given to you by your health care provider. Make sure you discuss any questions you have with your health care provider. ° °

## 2014-10-23 NOTE — Care Management Note (Signed)
CARE MANAGEMENT NOTE 10/23/2014  Patient:  Jill Bowman, Jill Bowman   Account Number:  [id number]  Date Initiated:  10/23/2014  Documentation initiated by:  Jill Bowman  Subjective/Objective Assessment:   dx abd wall abscess  admit- from home     Action/Plan:   [place] needs  3 in 1   Anticipated DC Date:  10/23/2014   Anticipated DC Plan:  [place]  In-house referral  NA      [place]  CM consult      [facility] Choice  HOME HEALTH   Choice offered to / List presented to:  C-1 Patient   DME arranged  3-N-1  Vassie Moselle      DME agency  [place] arranged  HH-1 RN      [place].   Status of service:  Completed, signed off Medicare Important Message given?  YES (If response is "NO", the following Medicare IM given date fields will be blank) Date Medicare IM given:  10/20/2014 Medicare IM given by:  Tomi Bamberger Date Additional Medicare IM given:  10/23/2014 Additional Medicare IM given by:  Jill Bowman  Discharge Disposition:  [place]  Per UR Regulation:  Reviewed for med. necessity/level of care/duration of stay  If discussed at [place] of Stay Meetings, dates discussed:    Comments:  10/23/14  [place], RN, BSN, CCM Spoke with patient's daughter about needs, says her patient will need a rolling walker, commode and RN to pack abdominal surgical inciision.  CM call Lelan Pons [facility] with patient need for RN to pack abdominql surgery site.  CM called Pilar Plate of [facility] for member's need for Commode and rolling Walker.  Plan is for patient to discharge home today.

## 2014-10-23 NOTE — Plan of Care (Signed)
Problem: Phase III Progression Outcomes Goal: Pain controlled on oral analgesia Outcome: Completed/Met Date Met:  10/23/14 Goal: Activity at appropriate level-compared to baseline (UP IN CHAIR FOR HEMODIALYSIS)  Outcome: Progressing Goal: Voiding independently Outcome: Completed/Met Date Met:  10/23/14  Problem: Phase II Progression Outcomes Goal: Vital signs stable Outcome: Progressing Goal: Surgical site without signs of infection Outcome: Progressing Goal: Return of bowel function (flatus, BM) IF ABDOMINAL SURGERY:  Outcome: Progressing Per patient had BM 10/22/2014

## 2014-10-23 NOTE — Progress Notes (Signed)
Pt getting ready to be discharged home.  HH ordered for wound management.  Dressing changes BID with 1 inch iodoform gauze.  She will need to see Dr. Rosendo Gros in 2-3 weeks for a post-op check.  The office will call to arrange this.   Coralie Keens, PA-C General Surgery [facility] [phone]

## 2014-10-23 NOTE — Progress Notes (Signed)
Patient removed peripheral IV in her sleep. Does not want to be restuck at this time. Pt states "i want to see if I get to go home today."

## 2014-10-24 DIAGNOSIS — S31109D Unspecified open wound of abdominal wall, unspecified quadrant without penetration into peritoneal cavity, subsequent encounter: Secondary | ICD-10-CM | POA: Diagnosis not present

## 2014-10-24 DIAGNOSIS — S31819D Unspecified open wound of right buttock, subsequent encounter: Secondary | ICD-10-CM | POA: Diagnosis not present

## 2014-10-24 DIAGNOSIS — J45909 Unspecified asthma, uncomplicated: Secondary | ICD-10-CM | POA: Diagnosis not present

## 2014-10-24 DIAGNOSIS — L03311 Cellulitis of abdominal wall: Secondary | ICD-10-CM | POA: Diagnosis not present

## 2014-10-24 DIAGNOSIS — S81802D Unspecified open wound, left lower leg, subsequent encounter: Secondary | ICD-10-CM | POA: Diagnosis not present

## 2014-10-24 DIAGNOSIS — L02211 Cutaneous abscess of abdominal wall: Secondary | ICD-10-CM | POA: Diagnosis not present

## 2014-10-24 DIAGNOSIS — I10 Essential (primary) hypertension: Secondary | ICD-10-CM | POA: Diagnosis not present

## 2014-10-24 DIAGNOSIS — E785 Hyperlipidemia, unspecified: Secondary | ICD-10-CM | POA: Diagnosis not present

## 2014-10-24 DIAGNOSIS — I2510 Atherosclerotic heart disease of native coronary artery without angina pectoris: Secondary | ICD-10-CM | POA: Diagnosis not present

## 2014-10-25 DIAGNOSIS — S81802D Unspecified open wound, left lower leg, subsequent encounter: Secondary | ICD-10-CM | POA: Diagnosis not present

## 2014-10-25 DIAGNOSIS — L03311 Cellulitis of abdominal wall: Secondary | ICD-10-CM | POA: Diagnosis not present

## 2014-10-25 DIAGNOSIS — L02211 Cutaneous abscess of abdominal wall: Secondary | ICD-10-CM | POA: Diagnosis not present

## 2014-10-25 DIAGNOSIS — I2510 Atherosclerotic heart disease of native coronary artery without angina pectoris: Secondary | ICD-10-CM | POA: Diagnosis not present

## 2014-10-25 DIAGNOSIS — S31819D Unspecified open wound of right buttock, subsequent encounter: Secondary | ICD-10-CM | POA: Diagnosis not present

## 2014-10-25 DIAGNOSIS — S31109D Unspecified open wound of abdominal wall, unspecified quadrant without penetration into peritoneal cavity, subsequent encounter: Secondary | ICD-10-CM | POA: Diagnosis not present

## 2014-10-25 LAB — ANAEROBIC CULTURE

## 2014-10-26 DIAGNOSIS — S31819D Unspecified open wound of right buttock, subsequent encounter: Secondary | ICD-10-CM | POA: Diagnosis not present

## 2014-10-26 DIAGNOSIS — L02211 Cutaneous abscess of abdominal wall: Secondary | ICD-10-CM | POA: Diagnosis not present

## 2014-10-26 DIAGNOSIS — L03311 Cellulitis of abdominal wall: Secondary | ICD-10-CM | POA: Diagnosis not present

## 2014-10-26 DIAGNOSIS — L0211 Cutaneous abscess of neck: Secondary | ICD-10-CM | POA: Diagnosis not present

## 2014-10-26 DIAGNOSIS — S81802D Unspecified open wound, left lower leg, subsequent encounter: Secondary | ICD-10-CM | POA: Diagnosis not present

## 2014-10-26 DIAGNOSIS — I2510 Atherosclerotic heart disease of native coronary artery without angina pectoris: Secondary | ICD-10-CM | POA: Diagnosis not present

## 2014-10-26 DIAGNOSIS — S31109D Unspecified open wound of abdominal wall, unspecified quadrant without penetration into peritoneal cavity, subsequent encounter: Secondary | ICD-10-CM | POA: Diagnosis not present

## 2014-10-26 LAB — CULTURE, ROUTINE-ABSCESS

## 2014-10-26 LAB — CULTURE, BLOOD (ROUTINE X 2)
CULTURE: NO GROWTH
Culture: NO GROWTH

## 2014-10-27 DIAGNOSIS — L02211 Cutaneous abscess of abdominal wall: Secondary | ICD-10-CM | POA: Diagnosis not present

## 2014-10-27 DIAGNOSIS — S31819D Unspecified open wound of right buttock, subsequent encounter: Secondary | ICD-10-CM | POA: Diagnosis not present

## 2014-10-27 DIAGNOSIS — S81802D Unspecified open wound, left lower leg, subsequent encounter: Secondary | ICD-10-CM | POA: Diagnosis not present

## 2014-10-27 DIAGNOSIS — L03311 Cellulitis of abdominal wall: Secondary | ICD-10-CM | POA: Diagnosis not present

## 2014-10-27 DIAGNOSIS — S31109D Unspecified open wound of abdominal wall, unspecified quadrant without penetration into peritoneal cavity, subsequent encounter: Secondary | ICD-10-CM | POA: Diagnosis not present

## 2014-10-27 DIAGNOSIS — I2510 Atherosclerotic heart disease of native coronary artery without angina pectoris: Secondary | ICD-10-CM | POA: Diagnosis not present

## 2014-10-29 DIAGNOSIS — S31819D Unspecified open wound of right buttock, subsequent encounter: Secondary | ICD-10-CM | POA: Diagnosis not present

## 2014-10-29 DIAGNOSIS — S81802D Unspecified open wound, left lower leg, subsequent encounter: Secondary | ICD-10-CM | POA: Diagnosis not present

## 2014-10-29 DIAGNOSIS — L02211 Cutaneous abscess of abdominal wall: Secondary | ICD-10-CM | POA: Diagnosis not present

## 2014-10-29 DIAGNOSIS — I2510 Atherosclerotic heart disease of native coronary artery without angina pectoris: Secondary | ICD-10-CM | POA: Diagnosis not present

## 2014-10-29 DIAGNOSIS — L03311 Cellulitis of abdominal wall: Secondary | ICD-10-CM | POA: Diagnosis not present

## 2014-10-29 DIAGNOSIS — S31109D Unspecified open wound of abdominal wall, unspecified quadrant without penetration into peritoneal cavity, subsequent encounter: Secondary | ICD-10-CM | POA: Diagnosis not present

## 2014-11-03 DIAGNOSIS — S31819D Unspecified open wound of right buttock, subsequent encounter: Secondary | ICD-10-CM | POA: Diagnosis not present

## 2014-11-03 DIAGNOSIS — I2510 Atherosclerotic heart disease of native coronary artery without angina pectoris: Secondary | ICD-10-CM | POA: Diagnosis not present

## 2014-11-03 DIAGNOSIS — L02211 Cutaneous abscess of abdominal wall: Secondary | ICD-10-CM | POA: Diagnosis not present

## 2014-11-03 DIAGNOSIS — S31109D Unspecified open wound of abdominal wall, unspecified quadrant without penetration into peritoneal cavity, subsequent encounter: Secondary | ICD-10-CM | POA: Diagnosis not present

## 2014-11-03 DIAGNOSIS — S81802D Unspecified open wound, left lower leg, subsequent encounter: Secondary | ICD-10-CM | POA: Diagnosis not present

## 2014-11-03 DIAGNOSIS — L03311 Cellulitis of abdominal wall: Secondary | ICD-10-CM | POA: Diagnosis not present

## 2014-11-05 DIAGNOSIS — S31819D Unspecified open wound of right buttock, subsequent encounter: Secondary | ICD-10-CM | POA: Diagnosis not present

## 2014-11-05 DIAGNOSIS — L03311 Cellulitis of abdominal wall: Secondary | ICD-10-CM | POA: Diagnosis not present

## 2014-11-05 DIAGNOSIS — S81802D Unspecified open wound, left lower leg, subsequent encounter: Secondary | ICD-10-CM | POA: Diagnosis not present

## 2014-11-05 DIAGNOSIS — S31109D Unspecified open wound of abdominal wall, unspecified quadrant without penetration into peritoneal cavity, subsequent encounter: Secondary | ICD-10-CM | POA: Diagnosis not present

## 2014-11-05 DIAGNOSIS — L02211 Cutaneous abscess of abdominal wall: Secondary | ICD-10-CM | POA: Diagnosis not present

## 2014-11-05 DIAGNOSIS — I2510 Atherosclerotic heart disease of native coronary artery without angina pectoris: Secondary | ICD-10-CM | POA: Diagnosis not present

## 2014-11-07 DIAGNOSIS — L02211 Cutaneous abscess of abdominal wall: Secondary | ICD-10-CM | POA: Diagnosis not present

## 2014-11-07 DIAGNOSIS — L03311 Cellulitis of abdominal wall: Secondary | ICD-10-CM | POA: Diagnosis not present

## 2014-11-07 DIAGNOSIS — S31819D Unspecified open wound of right buttock, subsequent encounter: Secondary | ICD-10-CM | POA: Diagnosis not present

## 2014-11-07 DIAGNOSIS — S31109D Unspecified open wound of abdominal wall, unspecified quadrant without penetration into peritoneal cavity, subsequent encounter: Secondary | ICD-10-CM | POA: Diagnosis not present

## 2014-11-07 DIAGNOSIS — S81802D Unspecified open wound, left lower leg, subsequent encounter: Secondary | ICD-10-CM | POA: Diagnosis not present

## 2014-11-07 DIAGNOSIS — I2510 Atherosclerotic heart disease of native coronary artery without angina pectoris: Secondary | ICD-10-CM | POA: Diagnosis not present

## 2014-11-10 DIAGNOSIS — S81802D Unspecified open wound, left lower leg, subsequent encounter: Secondary | ICD-10-CM | POA: Diagnosis not present

## 2014-11-10 DIAGNOSIS — I2510 Atherosclerotic heart disease of native coronary artery without angina pectoris: Secondary | ICD-10-CM | POA: Diagnosis not present

## 2014-11-10 DIAGNOSIS — L03311 Cellulitis of abdominal wall: Secondary | ICD-10-CM | POA: Diagnosis not present

## 2014-11-10 DIAGNOSIS — S31819D Unspecified open wound of right buttock, subsequent encounter: Secondary | ICD-10-CM | POA: Diagnosis not present

## 2014-11-10 DIAGNOSIS — L02211 Cutaneous abscess of abdominal wall: Secondary | ICD-10-CM | POA: Diagnosis not present

## 2014-11-10 DIAGNOSIS — S31109D Unspecified open wound of abdominal wall, unspecified quadrant without penetration into peritoneal cavity, subsequent encounter: Secondary | ICD-10-CM | POA: Diagnosis not present

## 2014-11-17 DIAGNOSIS — I2510 Atherosclerotic heart disease of native coronary artery without angina pectoris: Secondary | ICD-10-CM | POA: Diagnosis not present

## 2014-11-17 DIAGNOSIS — S81802D Unspecified open wound, left lower leg, subsequent encounter: Secondary | ICD-10-CM | POA: Diagnosis not present

## 2014-11-17 DIAGNOSIS — L03311 Cellulitis of abdominal wall: Secondary | ICD-10-CM | POA: Diagnosis not present

## 2014-11-17 DIAGNOSIS — S31819D Unspecified open wound of right buttock, subsequent encounter: Secondary | ICD-10-CM | POA: Diagnosis not present

## 2014-11-17 DIAGNOSIS — S31109D Unspecified open wound of abdominal wall, unspecified quadrant without penetration into peritoneal cavity, subsequent encounter: Secondary | ICD-10-CM | POA: Diagnosis not present

## 2014-11-17 DIAGNOSIS — L02211 Cutaneous abscess of abdominal wall: Secondary | ICD-10-CM | POA: Diagnosis not present

## 2014-11-24 DIAGNOSIS — L03311 Cellulitis of abdominal wall: Secondary | ICD-10-CM | POA: Diagnosis not present

## 2014-11-24 DIAGNOSIS — S81802D Unspecified open wound, left lower leg, subsequent encounter: Secondary | ICD-10-CM | POA: Diagnosis not present

## 2014-11-24 DIAGNOSIS — I2510 Atherosclerotic heart disease of native coronary artery without angina pectoris: Secondary | ICD-10-CM | POA: Diagnosis not present

## 2014-11-24 DIAGNOSIS — L02211 Cutaneous abscess of abdominal wall: Secondary | ICD-10-CM | POA: Diagnosis not present

## 2014-11-24 DIAGNOSIS — S31819D Unspecified open wound of right buttock, subsequent encounter: Secondary | ICD-10-CM | POA: Diagnosis not present

## 2014-11-24 DIAGNOSIS — S31109D Unspecified open wound of abdominal wall, unspecified quadrant without penetration into peritoneal cavity, subsequent encounter: Secondary | ICD-10-CM | POA: Diagnosis not present

## 2014-12-03 DIAGNOSIS — S31109D Unspecified open wound of abdominal wall, unspecified quadrant without penetration into peritoneal cavity, subsequent encounter: Secondary | ICD-10-CM | POA: Diagnosis not present

## 2014-12-03 DIAGNOSIS — I2510 Atherosclerotic heart disease of native coronary artery without angina pectoris: Secondary | ICD-10-CM | POA: Diagnosis not present

## 2014-12-03 DIAGNOSIS — S31819D Unspecified open wound of right buttock, subsequent encounter: Secondary | ICD-10-CM | POA: Diagnosis not present

## 2014-12-03 DIAGNOSIS — L03311 Cellulitis of abdominal wall: Secondary | ICD-10-CM | POA: Diagnosis not present

## 2014-12-03 DIAGNOSIS — L02211 Cutaneous abscess of abdominal wall: Secondary | ICD-10-CM | POA: Diagnosis not present

## 2014-12-03 DIAGNOSIS — S81802D Unspecified open wound, left lower leg, subsequent encounter: Secondary | ICD-10-CM | POA: Diagnosis not present

## 2014-12-05 DIAGNOSIS — L03311 Cellulitis of abdominal wall: Secondary | ICD-10-CM | POA: Diagnosis not present

## 2014-12-05 DIAGNOSIS — S81802D Unspecified open wound, left lower leg, subsequent encounter: Secondary | ICD-10-CM | POA: Diagnosis not present

## 2014-12-05 DIAGNOSIS — I2510 Atherosclerotic heart disease of native coronary artery without angina pectoris: Secondary | ICD-10-CM | POA: Diagnosis not present

## 2014-12-05 DIAGNOSIS — S31109D Unspecified open wound of abdominal wall, unspecified quadrant without penetration into peritoneal cavity, subsequent encounter: Secondary | ICD-10-CM | POA: Diagnosis not present

## 2014-12-05 DIAGNOSIS — S31819D Unspecified open wound of right buttock, subsequent encounter: Secondary | ICD-10-CM | POA: Diagnosis not present

## 2014-12-05 DIAGNOSIS — L02211 Cutaneous abscess of abdominal wall: Secondary | ICD-10-CM | POA: Diagnosis not present

## 2014-12-08 DIAGNOSIS — S81802D Unspecified open wound, left lower leg, subsequent encounter: Secondary | ICD-10-CM | POA: Diagnosis not present

## 2014-12-08 DIAGNOSIS — S31109D Unspecified open wound of abdominal wall, unspecified quadrant without penetration into peritoneal cavity, subsequent encounter: Secondary | ICD-10-CM | POA: Diagnosis not present

## 2014-12-08 DIAGNOSIS — S31819D Unspecified open wound of right buttock, subsequent encounter: Secondary | ICD-10-CM | POA: Diagnosis not present

## 2014-12-08 DIAGNOSIS — L03311 Cellulitis of abdominal wall: Secondary | ICD-10-CM | POA: Diagnosis not present

## 2014-12-08 DIAGNOSIS — I2510 Atherosclerotic heart disease of native coronary artery without angina pectoris: Secondary | ICD-10-CM | POA: Diagnosis not present

## 2014-12-08 DIAGNOSIS — L02211 Cutaneous abscess of abdominal wall: Secondary | ICD-10-CM | POA: Diagnosis not present

## 2014-12-09 DIAGNOSIS — M79662 Pain in left lower leg: Secondary | ICD-10-CM | POA: Diagnosis not present

## 2014-12-09 DIAGNOSIS — S72452D Displaced supracondylar fracture without intracondylar extension of lower end of left femur, subsequent encounter for closed fracture with routine healing: Secondary | ICD-10-CM | POA: Diagnosis not present

## 2014-12-09 DIAGNOSIS — S72451D Displaced supracondylar fracture without intracondylar extension of lower end of right femur, subsequent encounter for closed fracture with routine healing: Secondary | ICD-10-CM | POA: Diagnosis not present

## 2014-12-09 DIAGNOSIS — M25571 Pain in right ankle and joints of right foot: Secondary | ICD-10-CM | POA: Diagnosis not present

## 2014-12-09 DIAGNOSIS — M79661 Pain in right lower leg: Secondary | ICD-10-CM | POA: Diagnosis not present

## 2014-12-14 DIAGNOSIS — I2510 Atherosclerotic heart disease of native coronary artery without angina pectoris: Secondary | ICD-10-CM | POA: Diagnosis not present

## 2014-12-14 DIAGNOSIS — I10 Essential (primary) hypertension: Secondary | ICD-10-CM | POA: Diagnosis not present

## 2014-12-14 DIAGNOSIS — E780 Pure hypercholesterolemia: Secondary | ICD-10-CM | POA: Diagnosis not present

## 2014-12-14 DIAGNOSIS — Z950 Presence of cardiac pacemaker: Secondary | ICD-10-CM | POA: Diagnosis not present

## 2014-12-15 DIAGNOSIS — S31819D Unspecified open wound of right buttock, subsequent encounter: Secondary | ICD-10-CM | POA: Diagnosis not present

## 2014-12-15 DIAGNOSIS — S81802D Unspecified open wound, left lower leg, subsequent encounter: Secondary | ICD-10-CM | POA: Diagnosis not present

## 2014-12-15 DIAGNOSIS — L03311 Cellulitis of abdominal wall: Secondary | ICD-10-CM | POA: Diagnosis not present

## 2014-12-15 DIAGNOSIS — S31109D Unspecified open wound of abdominal wall, unspecified quadrant without penetration into peritoneal cavity, subsequent encounter: Secondary | ICD-10-CM | POA: Diagnosis not present

## 2014-12-15 DIAGNOSIS — L02211 Cutaneous abscess of abdominal wall: Secondary | ICD-10-CM | POA: Diagnosis not present

## 2014-12-15 DIAGNOSIS — I2510 Atherosclerotic heart disease of native coronary artery without angina pectoris: Secondary | ICD-10-CM | POA: Diagnosis not present

## 2014-12-22 DIAGNOSIS — S31819D Unspecified open wound of right buttock, subsequent encounter: Secondary | ICD-10-CM | POA: Diagnosis not present

## 2014-12-22 DIAGNOSIS — I2510 Atherosclerotic heart disease of native coronary artery without angina pectoris: Secondary | ICD-10-CM | POA: Diagnosis not present

## 2014-12-22 DIAGNOSIS — L03311 Cellulitis of abdominal wall: Secondary | ICD-10-CM | POA: Diagnosis not present

## 2014-12-22 DIAGNOSIS — L02211 Cutaneous abscess of abdominal wall: Secondary | ICD-10-CM | POA: Diagnosis not present

## 2014-12-22 DIAGNOSIS — S31109D Unspecified open wound of abdominal wall, unspecified quadrant without penetration into peritoneal cavity, subsequent encounter: Secondary | ICD-10-CM | POA: Diagnosis not present

## 2014-12-22 DIAGNOSIS — S81802D Unspecified open wound, left lower leg, subsequent encounter: Secondary | ICD-10-CM | POA: Diagnosis not present

## 2014-12-23 DIAGNOSIS — I2510 Atherosclerotic heart disease of native coronary artery without angina pectoris: Secondary | ICD-10-CM | POA: Diagnosis not present

## 2014-12-23 DIAGNOSIS — E785 Hyperlipidemia, unspecified: Secondary | ICD-10-CM | POA: Diagnosis not present

## 2014-12-23 DIAGNOSIS — L02211 Cutaneous abscess of abdominal wall: Secondary | ICD-10-CM | POA: Diagnosis not present

## 2014-12-23 DIAGNOSIS — I10 Essential (primary) hypertension: Secondary | ICD-10-CM | POA: Diagnosis not present

## 2014-12-23 DIAGNOSIS — J45909 Unspecified asthma, uncomplicated: Secondary | ICD-10-CM | POA: Diagnosis not present

## 2014-12-23 DIAGNOSIS — S81802D Unspecified open wound, left lower leg, subsequent encounter: Secondary | ICD-10-CM | POA: Diagnosis not present

## 2014-12-23 DIAGNOSIS — S31109D Unspecified open wound of abdominal wall, unspecified quadrant without penetration into peritoneal cavity, subsequent encounter: Secondary | ICD-10-CM | POA: Diagnosis not present

## 2014-12-28 DIAGNOSIS — N183 Chronic kidney disease, stage 3 (moderate): Secondary | ICD-10-CM | POA: Diagnosis not present

## 2014-12-28 DIAGNOSIS — I129 Hypertensive chronic kidney disease with stage 1 through stage 4 chronic kidney disease, or unspecified chronic kidney disease: Secondary | ICD-10-CM | POA: Diagnosis not present

## 2014-12-28 DIAGNOSIS — Z79899 Other long term (current) drug therapy: Secondary | ICD-10-CM | POA: Diagnosis not present

## 2014-12-28 DIAGNOSIS — Z5181 Encounter for therapeutic drug level monitoring: Secondary | ICD-10-CM | POA: Diagnosis not present

## 2014-12-28 DIAGNOSIS — I10 Essential (primary) hypertension: Secondary | ICD-10-CM | POA: Diagnosis not present

## 2014-12-28 DIAGNOSIS — L02211 Cutaneous abscess of abdominal wall: Secondary | ICD-10-CM | POA: Diagnosis not present

## 2014-12-28 DIAGNOSIS — D72829 Elevated white blood cell count, unspecified: Secondary | ICD-10-CM | POA: Diagnosis not present

## 2014-12-28 DIAGNOSIS — E782 Mixed hyperlipidemia: Secondary | ICD-10-CM | POA: Diagnosis not present

## 2014-12-28 DIAGNOSIS — R7309 Other abnormal glucose: Secondary | ICD-10-CM | POA: Diagnosis not present

## 2014-12-28 DIAGNOSIS — R748 Abnormal levels of other serum enzymes: Secondary | ICD-10-CM | POA: Insufficient documentation

## 2014-12-28 DIAGNOSIS — R5383 Other fatigue: Secondary | ICD-10-CM | POA: Diagnosis not present

## 2014-12-30 DIAGNOSIS — J45909 Unspecified asthma, uncomplicated: Secondary | ICD-10-CM | POA: Diagnosis not present

## 2014-12-30 DIAGNOSIS — S81802D Unspecified open wound, left lower leg, subsequent encounter: Secondary | ICD-10-CM | POA: Diagnosis not present

## 2014-12-30 DIAGNOSIS — I10 Essential (primary) hypertension: Secondary | ICD-10-CM | POA: Diagnosis not present

## 2014-12-30 DIAGNOSIS — L02211 Cutaneous abscess of abdominal wall: Secondary | ICD-10-CM | POA: Diagnosis not present

## 2014-12-30 DIAGNOSIS — I2510 Atherosclerotic heart disease of native coronary artery without angina pectoris: Secondary | ICD-10-CM | POA: Diagnosis not present

## 2014-12-30 DIAGNOSIS — S31109D Unspecified open wound of abdominal wall, unspecified quadrant without penetration into peritoneal cavity, subsequent encounter: Secondary | ICD-10-CM | POA: Diagnosis not present

## 2015-01-05 DIAGNOSIS — S31109D Unspecified open wound of abdominal wall, unspecified quadrant without penetration into peritoneal cavity, subsequent encounter: Secondary | ICD-10-CM | POA: Diagnosis not present

## 2015-01-05 DIAGNOSIS — I10 Essential (primary) hypertension: Secondary | ICD-10-CM | POA: Diagnosis not present

## 2015-01-05 DIAGNOSIS — L02211 Cutaneous abscess of abdominal wall: Secondary | ICD-10-CM | POA: Diagnosis not present

## 2015-01-05 DIAGNOSIS — I2510 Atherosclerotic heart disease of native coronary artery without angina pectoris: Secondary | ICD-10-CM | POA: Diagnosis not present

## 2015-01-05 DIAGNOSIS — J45909 Unspecified asthma, uncomplicated: Secondary | ICD-10-CM | POA: Diagnosis not present

## 2015-01-05 DIAGNOSIS — S81802D Unspecified open wound, left lower leg, subsequent encounter: Secondary | ICD-10-CM | POA: Diagnosis not present

## 2015-01-12 DIAGNOSIS — J45909 Unspecified asthma, uncomplicated: Secondary | ICD-10-CM | POA: Diagnosis not present

## 2015-01-12 DIAGNOSIS — S31109D Unspecified open wound of abdominal wall, unspecified quadrant without penetration into peritoneal cavity, subsequent encounter: Secondary | ICD-10-CM | POA: Diagnosis not present

## 2015-01-12 DIAGNOSIS — L02211 Cutaneous abscess of abdominal wall: Secondary | ICD-10-CM | POA: Diagnosis not present

## 2015-01-12 DIAGNOSIS — I10 Essential (primary) hypertension: Secondary | ICD-10-CM | POA: Diagnosis not present

## 2015-01-12 DIAGNOSIS — I2510 Atherosclerotic heart disease of native coronary artery without angina pectoris: Secondary | ICD-10-CM | POA: Diagnosis not present

## 2015-01-12 DIAGNOSIS — S81802D Unspecified open wound, left lower leg, subsequent encounter: Secondary | ICD-10-CM | POA: Diagnosis not present

## 2015-01-20 DIAGNOSIS — I10 Essential (primary) hypertension: Secondary | ICD-10-CM | POA: Diagnosis not present

## 2015-01-20 DIAGNOSIS — J45909 Unspecified asthma, uncomplicated: Secondary | ICD-10-CM | POA: Diagnosis not present

## 2015-01-20 DIAGNOSIS — S31109D Unspecified open wound of abdominal wall, unspecified quadrant without penetration into peritoneal cavity, subsequent encounter: Secondary | ICD-10-CM | POA: Diagnosis not present

## 2015-01-20 DIAGNOSIS — S81802D Unspecified open wound, left lower leg, subsequent encounter: Secondary | ICD-10-CM | POA: Diagnosis not present

## 2015-01-20 DIAGNOSIS — I2510 Atherosclerotic heart disease of native coronary artery without angina pectoris: Secondary | ICD-10-CM | POA: Diagnosis not present

## 2015-01-20 DIAGNOSIS — L02211 Cutaneous abscess of abdominal wall: Secondary | ICD-10-CM | POA: Diagnosis not present

## 2015-01-22 ENCOUNTER — Other Ambulatory Visit: Payer: Self-pay | Admitting: Hematology and Oncology

## 2015-01-22 DIAGNOSIS — Z853 Personal history of malignant neoplasm of breast: Secondary | ICD-10-CM

## 2015-01-26 DIAGNOSIS — I2510 Atherosclerotic heart disease of native coronary artery without angina pectoris: Secondary | ICD-10-CM | POA: Diagnosis not present

## 2015-01-26 DIAGNOSIS — S31109D Unspecified open wound of abdominal wall, unspecified quadrant without penetration into peritoneal cavity, subsequent encounter: Secondary | ICD-10-CM | POA: Diagnosis not present

## 2015-01-26 DIAGNOSIS — J45909 Unspecified asthma, uncomplicated: Secondary | ICD-10-CM | POA: Diagnosis not present

## 2015-01-26 DIAGNOSIS — S81802D Unspecified open wound, left lower leg, subsequent encounter: Secondary | ICD-10-CM | POA: Diagnosis not present

## 2015-01-26 DIAGNOSIS — I10 Essential (primary) hypertension: Secondary | ICD-10-CM | POA: Diagnosis not present

## 2015-01-26 DIAGNOSIS — L02211 Cutaneous abscess of abdominal wall: Secondary | ICD-10-CM | POA: Diagnosis not present

## 2015-01-27 DIAGNOSIS — H2513 Age-related nuclear cataract, bilateral: Secondary | ICD-10-CM | POA: Diagnosis not present

## 2015-01-27 DIAGNOSIS — H04123 Dry eye syndrome of bilateral lacrimal glands: Secondary | ICD-10-CM | POA: Diagnosis not present

## 2015-01-27 DIAGNOSIS — H40013 Open angle with borderline findings, low risk, bilateral: Secondary | ICD-10-CM | POA: Diagnosis not present

## 2015-02-03 ENCOUNTER — Ambulatory Visit
Admission: RE | Admit: 2015-02-03 | Discharge: 2015-02-03 | Disposition: A | Discharge status: Home / Self Care | Payer: Medicare Other | Source: Ambulatory Visit | Attending: Hematology and Oncology | Admitting: Hematology and Oncology

## 2015-02-03 DIAGNOSIS — Z853 Personal history of malignant neoplasm of breast: Secondary | ICD-10-CM

## 2015-02-03 DIAGNOSIS — N6489 Other specified disorders of breast: Secondary | ICD-10-CM | POA: Diagnosis not present

## 2015-02-04 DIAGNOSIS — J45909 Unspecified asthma, uncomplicated: Secondary | ICD-10-CM | POA: Diagnosis not present

## 2015-02-04 DIAGNOSIS — L02211 Cutaneous abscess of abdominal wall: Secondary | ICD-10-CM | POA: Diagnosis not present

## 2015-02-04 DIAGNOSIS — I10 Essential (primary) hypertension: Secondary | ICD-10-CM | POA: Diagnosis not present

## 2015-02-04 DIAGNOSIS — I2510 Atherosclerotic heart disease of native coronary artery without angina pectoris: Secondary | ICD-10-CM | POA: Diagnosis not present

## 2015-02-04 DIAGNOSIS — S31109D Unspecified open wound of abdominal wall, unspecified quadrant without penetration into peritoneal cavity, subsequent encounter: Secondary | ICD-10-CM | POA: Diagnosis not present

## 2015-02-04 DIAGNOSIS — S81802D Unspecified open wound, left lower leg, subsequent encounter: Secondary | ICD-10-CM | POA: Diagnosis not present

## 2015-02-09 DIAGNOSIS — S81802D Unspecified open wound, left lower leg, subsequent encounter: Secondary | ICD-10-CM | POA: Diagnosis not present

## 2015-02-09 DIAGNOSIS — I10 Essential (primary) hypertension: Secondary | ICD-10-CM | POA: Diagnosis not present

## 2015-02-09 DIAGNOSIS — S31109D Unspecified open wound of abdominal wall, unspecified quadrant without penetration into peritoneal cavity, subsequent encounter: Secondary | ICD-10-CM | POA: Diagnosis not present

## 2015-02-09 DIAGNOSIS — I2510 Atherosclerotic heart disease of native coronary artery without angina pectoris: Secondary | ICD-10-CM | POA: Diagnosis not present

## 2015-02-09 DIAGNOSIS — L02211 Cutaneous abscess of abdominal wall: Secondary | ICD-10-CM | POA: Diagnosis not present

## 2015-02-09 DIAGNOSIS — J45909 Unspecified asthma, uncomplicated: Secondary | ICD-10-CM | POA: Diagnosis not present

## 2015-02-16 DIAGNOSIS — L02211 Cutaneous abscess of abdominal wall: Secondary | ICD-10-CM | POA: Diagnosis not present

## 2015-02-16 DIAGNOSIS — S81802D Unspecified open wound, left lower leg, subsequent encounter: Secondary | ICD-10-CM | POA: Diagnosis not present

## 2015-02-16 DIAGNOSIS — I10 Essential (primary) hypertension: Secondary | ICD-10-CM | POA: Diagnosis not present

## 2015-02-16 DIAGNOSIS — J45909 Unspecified asthma, uncomplicated: Secondary | ICD-10-CM | POA: Diagnosis not present

## 2015-02-16 DIAGNOSIS — S31109D Unspecified open wound of abdominal wall, unspecified quadrant without penetration into peritoneal cavity, subsequent encounter: Secondary | ICD-10-CM | POA: Diagnosis not present

## 2015-02-16 DIAGNOSIS — I2510 Atherosclerotic heart disease of native coronary artery without angina pectoris: Secondary | ICD-10-CM | POA: Diagnosis not present

## 2015-02-17 DIAGNOSIS — L84 Corns and callosities: Secondary | ICD-10-CM | POA: Diagnosis not present

## 2015-02-17 DIAGNOSIS — L602 Onychogryphosis: Secondary | ICD-10-CM | POA: Diagnosis not present

## 2015-02-17 DIAGNOSIS — I739 Peripheral vascular disease, unspecified: Secondary | ICD-10-CM | POA: Diagnosis not present

## 2015-02-17 DIAGNOSIS — E1140 Type 2 diabetes mellitus with diabetic neuropathy, unspecified: Secondary | ICD-10-CM | POA: Diagnosis not present

## 2015-02-17 DIAGNOSIS — E1151 Type 2 diabetes mellitus with diabetic peripheral angiopathy without gangrene: Secondary | ICD-10-CM | POA: Diagnosis not present

## 2015-02-21 DIAGNOSIS — I10 Essential (primary) hypertension: Secondary | ICD-10-CM | POA: Diagnosis not present

## 2015-02-21 DIAGNOSIS — J45909 Unspecified asthma, uncomplicated: Secondary | ICD-10-CM | POA: Diagnosis not present

## 2015-02-21 DIAGNOSIS — L02211 Cutaneous abscess of abdominal wall: Secondary | ICD-10-CM | POA: Diagnosis not present

## 2015-02-21 DIAGNOSIS — I2510 Atherosclerotic heart disease of native coronary artery without angina pectoris: Secondary | ICD-10-CM | POA: Diagnosis not present

## 2015-02-21 DIAGNOSIS — E785 Hyperlipidemia, unspecified: Secondary | ICD-10-CM | POA: Diagnosis not present

## 2015-02-23 DIAGNOSIS — I2510 Atherosclerotic heart disease of native coronary artery without angina pectoris: Secondary | ICD-10-CM | POA: Diagnosis not present

## 2015-02-23 DIAGNOSIS — I10 Essential (primary) hypertension: Secondary | ICD-10-CM | POA: Diagnosis not present

## 2015-02-23 DIAGNOSIS — L02211 Cutaneous abscess of abdominal wall: Secondary | ICD-10-CM | POA: Diagnosis not present

## 2015-02-23 DIAGNOSIS — J45909 Unspecified asthma, uncomplicated: Secondary | ICD-10-CM | POA: Diagnosis not present

## 2015-02-23 DIAGNOSIS — E785 Hyperlipidemia, unspecified: Secondary | ICD-10-CM | POA: Diagnosis not present

## 2015-03-02 DIAGNOSIS — L02211 Cutaneous abscess of abdominal wall: Secondary | ICD-10-CM | POA: Diagnosis not present

## 2015-03-02 DIAGNOSIS — E785 Hyperlipidemia, unspecified: Secondary | ICD-10-CM | POA: Diagnosis not present

## 2015-03-02 DIAGNOSIS — J45909 Unspecified asthma, uncomplicated: Secondary | ICD-10-CM | POA: Diagnosis not present

## 2015-03-02 DIAGNOSIS — I10 Essential (primary) hypertension: Secondary | ICD-10-CM | POA: Diagnosis not present

## 2015-03-02 DIAGNOSIS — I2510 Atherosclerotic heart disease of native coronary artery without angina pectoris: Secondary | ICD-10-CM | POA: Diagnosis not present

## 2015-03-03 ENCOUNTER — Other Ambulatory Visit: Payer: Self-pay | Admitting: *Deleted

## 2015-03-03 ENCOUNTER — Other Ambulatory Visit (INDEPENDENT_AMBULATORY_CARE_PROVIDER_SITE_OTHER): Payer: Self-pay | Admitting: General Surgery

## 2015-03-03 DIAGNOSIS — K651 Peritoneal abscess: Secondary | ICD-10-CM | POA: Diagnosis not present

## 2015-03-03 DIAGNOSIS — R1033 Periumbilical pain: Secondary | ICD-10-CM

## 2015-03-04 ENCOUNTER — Other Ambulatory Visit: Payer: Self-pay | Admitting: *Deleted

## 2015-03-04 DIAGNOSIS — R1033 Periumbilical pain: Secondary | ICD-10-CM

## 2015-03-04 NOTE — Addendum Note (Signed)
Addended by: Ralene Ok on: 03/04/2015 08:43 AM   Modules accepted: Orders

## 2015-03-08 ENCOUNTER — Ambulatory Visit
Admission: RE | Admit: 2015-03-08 | Discharge: 2015-03-08 | Disposition: A | Discharge status: Home / Self Care | Payer: Medicare Other | Source: Ambulatory Visit | Attending: General Surgery | Admitting: General Surgery

## 2015-03-08 DIAGNOSIS — K449 Diaphragmatic hernia without obstruction or gangrene: Secondary | ICD-10-CM | POA: Diagnosis not present

## 2015-03-08 DIAGNOSIS — K429 Umbilical hernia without obstruction or gangrene: Secondary | ICD-10-CM | POA: Diagnosis not present

## 2015-03-08 DIAGNOSIS — L02211 Cutaneous abscess of abdominal wall: Secondary | ICD-10-CM | POA: Diagnosis not present

## 2015-03-08 DIAGNOSIS — K5730 Diverticulosis of large intestine without perforation or abscess without bleeding: Secondary | ICD-10-CM | POA: Diagnosis not present

## 2015-03-08 MED ORDER — IOPAMIDOL (ISOVUE-300) INJECTION 61%
100.0000 mL | Freq: Once | INTRAVENOUS | Status: AC | PRN
  Administered 2015-03-08: 100 mL via INTRAVENOUS

## 2015-03-10 DIAGNOSIS — L02211 Cutaneous abscess of abdominal wall: Secondary | ICD-10-CM | POA: Diagnosis not present

## 2015-03-10 DIAGNOSIS — E785 Hyperlipidemia, unspecified: Secondary | ICD-10-CM | POA: Diagnosis not present

## 2015-03-10 DIAGNOSIS — J45909 Unspecified asthma, uncomplicated: Secondary | ICD-10-CM | POA: Diagnosis not present

## 2015-03-10 DIAGNOSIS — I10 Essential (primary) hypertension: Secondary | ICD-10-CM | POA: Diagnosis not present

## 2015-03-10 DIAGNOSIS — I2510 Atherosclerotic heart disease of native coronary artery without angina pectoris: Secondary | ICD-10-CM | POA: Diagnosis not present

## 2015-03-16 DIAGNOSIS — L02211 Cutaneous abscess of abdominal wall: Secondary | ICD-10-CM | POA: Diagnosis not present

## 2015-03-16 DIAGNOSIS — I10 Essential (primary) hypertension: Secondary | ICD-10-CM | POA: Diagnosis not present

## 2015-03-16 DIAGNOSIS — I2510 Atherosclerotic heart disease of native coronary artery without angina pectoris: Secondary | ICD-10-CM | POA: Diagnosis not present

## 2015-03-16 DIAGNOSIS — J45909 Unspecified asthma, uncomplicated: Secondary | ICD-10-CM | POA: Diagnosis not present

## 2015-03-16 DIAGNOSIS — E785 Hyperlipidemia, unspecified: Secondary | ICD-10-CM | POA: Diagnosis not present

## 2015-03-23 DIAGNOSIS — I2510 Atherosclerotic heart disease of native coronary artery without angina pectoris: Secondary | ICD-10-CM | POA: Diagnosis not present

## 2015-03-23 DIAGNOSIS — E785 Hyperlipidemia, unspecified: Secondary | ICD-10-CM | POA: Diagnosis not present

## 2015-03-23 DIAGNOSIS — J45909 Unspecified asthma, uncomplicated: Secondary | ICD-10-CM | POA: Diagnosis not present

## 2015-03-23 DIAGNOSIS — L02211 Cutaneous abscess of abdominal wall: Secondary | ICD-10-CM | POA: Diagnosis not present

## 2015-03-23 DIAGNOSIS — I10 Essential (primary) hypertension: Secondary | ICD-10-CM | POA: Diagnosis not present

## 2015-03-24 ENCOUNTER — Emergency Department (HOSPITAL_COMMUNITY): Payer: Medicare Other

## 2015-03-24 ENCOUNTER — Encounter (HOSPITAL_COMMUNITY): Payer: Self-pay | Admitting: Emergency Medicine

## 2015-03-24 ENCOUNTER — Inpatient Hospital Stay (HOSPITAL_COMMUNITY)
Admission: EM | Admit: 2015-03-24 | Discharge: 2015-03-30 | DRG: 920 | Disposition: A | Discharge status: Home Health | Payer: Medicare Other | Attending: Internal Medicine | Admitting: Internal Medicine

## 2015-03-24 DIAGNOSIS — R079 Chest pain, unspecified: Secondary | ICD-10-CM | POA: Diagnosis not present

## 2015-03-24 DIAGNOSIS — Z01810 Encounter for preprocedural cardiovascular examination: Secondary | ICD-10-CM | POA: Diagnosis not present

## 2015-03-24 DIAGNOSIS — B9562 Methicillin resistant Staphylococcus aureus infection as the cause of diseases classified elsewhere: Secondary | ICD-10-CM | POA: Diagnosis present

## 2015-03-24 DIAGNOSIS — I10 Essential (primary) hypertension: Secondary | ICD-10-CM | POA: Diagnosis present

## 2015-03-24 DIAGNOSIS — Z6841 Body Mass Index (BMI) 40.0 and over, adult: Secondary | ICD-10-CM

## 2015-03-24 DIAGNOSIS — E119 Type 2 diabetes mellitus without complications: Secondary | ICD-10-CM | POA: Diagnosis present

## 2015-03-24 DIAGNOSIS — Y838 Other surgical procedures as the cause of abnormal reaction of the patient, or of later complication, without mention of misadventure at the time of the procedure: Secondary | ICD-10-CM | POA: Diagnosis present

## 2015-03-24 DIAGNOSIS — I4891 Unspecified atrial fibrillation: Secondary | ICD-10-CM | POA: Diagnosis present

## 2015-03-24 DIAGNOSIS — Z91040 Latex allergy status: Secondary | ICD-10-CM

## 2015-03-24 DIAGNOSIS — M1389 Other specified arthritis, multiple sites: Secondary | ICD-10-CM | POA: Diagnosis present

## 2015-03-24 DIAGNOSIS — Z955 Presence of coronary angioplasty implant and graft: Secondary | ICD-10-CM

## 2015-03-24 DIAGNOSIS — Z79899 Other long term (current) drug therapy: Secondary | ICD-10-CM | POA: Diagnosis not present

## 2015-03-24 DIAGNOSIS — Z880 Allergy status to penicillin: Secondary | ICD-10-CM

## 2015-03-24 DIAGNOSIS — J45909 Unspecified asthma, uncomplicated: Secondary | ICD-10-CM | POA: Diagnosis present

## 2015-03-24 DIAGNOSIS — Z950 Presence of cardiac pacemaker: Secondary | ICD-10-CM

## 2015-03-24 DIAGNOSIS — R5383 Other fatigue: Secondary | ICD-10-CM | POA: Diagnosis not present

## 2015-03-24 DIAGNOSIS — Z90710 Acquired absence of both cervix and uterus: Secondary | ICD-10-CM

## 2015-03-24 DIAGNOSIS — Z7982 Long term (current) use of aspirin: Secondary | ICD-10-CM

## 2015-03-24 DIAGNOSIS — Z9049 Acquired absence of other specified parts of digestive tract: Secondary | ICD-10-CM | POA: Diagnosis present

## 2015-03-24 DIAGNOSIS — K219 Gastro-esophageal reflux disease without esophagitis: Secondary | ICD-10-CM | POA: Diagnosis present

## 2015-03-24 DIAGNOSIS — Z882 Allergy status to sulfonamides status: Secondary | ICD-10-CM

## 2015-03-24 DIAGNOSIS — Z96653 Presence of artificial knee joint, bilateral: Secondary | ICD-10-CM | POA: Diagnosis present

## 2015-03-24 DIAGNOSIS — I959 Hypotension, unspecified: Secondary | ICD-10-CM | POA: Diagnosis present

## 2015-03-24 DIAGNOSIS — L03311 Cellulitis of abdominal wall: Secondary | ICD-10-CM | POA: Insufficient documentation

## 2015-03-24 DIAGNOSIS — F39 Unspecified mood [affective] disorder: Secondary | ICD-10-CM | POA: Diagnosis present

## 2015-03-24 DIAGNOSIS — Z888 Allergy status to other drugs, medicaments and biological substances status: Secondary | ICD-10-CM

## 2015-03-24 DIAGNOSIS — Z90721 Acquired absence of ovaries, unilateral: Secondary | ICD-10-CM | POA: Diagnosis present

## 2015-03-24 DIAGNOSIS — E785 Hyperlipidemia, unspecified: Secondary | ICD-10-CM | POA: Diagnosis present

## 2015-03-24 DIAGNOSIS — Z881 Allergy status to other antibiotic agents status: Secondary | ICD-10-CM | POA: Diagnosis not present

## 2015-03-24 DIAGNOSIS — T8579XA Infection and inflammatory reaction due to other internal prosthetic devices, implants and grafts, initial encounter: Principal | ICD-10-CM | POA: Diagnosis present

## 2015-03-24 DIAGNOSIS — L02211 Cutaneous abscess of abdominal wall: Secondary | ICD-10-CM | POA: Diagnosis not present

## 2015-03-24 DIAGNOSIS — I495 Sick sinus syndrome: Secondary | ICD-10-CM | POA: Diagnosis not present

## 2015-03-24 DIAGNOSIS — I2510 Atherosclerotic heart disease of native coronary artery without angina pectoris: Secondary | ICD-10-CM | POA: Diagnosis present

## 2015-03-24 DIAGNOSIS — Z885 Allergy status to narcotic agent status: Secondary | ICD-10-CM | POA: Diagnosis not present

## 2015-03-24 HISTORY — DX: Cutaneous abscess of abdominal wall: L02.211

## 2015-03-24 LAB — COMPREHENSIVE METABOLIC PANEL
ALT: 15 U/L (ref 0–35)
AST: 23 U/L (ref 0–37)
Albumin: 2.6 g/dL — ABNORMAL LOW (ref 3.5–5.2)
Alkaline Phosphatase: 113 U/L (ref 39–117)
Anion gap: 12 (ref 5–15)
BUN: 26 mg/dL — ABNORMAL HIGH (ref 6–23)
CO2: 23 mmol/L (ref 19–32)
CREATININE: 1.27 mg/dL — AB (ref 0.50–1.10)
Calcium: 8.8 mg/dL (ref 8.4–10.5)
Chloride: 102 mmol/L (ref 96–112)
GFR, EST AFRICAN AMERICAN: 44 mL/min — AB (ref >90)
GFR, EST NON AFRICAN AMERICAN: 38 mL/min — AB (ref >90)
Glucose, Bld: 76 mg/dL (ref 70–99)
Potassium: 3.7 mmol/L (ref 3.5–5.1)
SODIUM: 137 mmol/L (ref 135–145)
TOTAL PROTEIN: 6.8 g/dL (ref 6.0–8.3)
Total Bilirubin: 0.5 mg/dL (ref 0.3–1.2)

## 2015-03-24 LAB — BASIC METABOLIC PANEL
Anion gap: 13 (ref 5–15)
BUN: 30 mg/dL — AB (ref 6–23)
CHLORIDE: 105 mmol/L (ref 96–112)
CO2: 18 mmol/L — ABNORMAL LOW (ref 19–32)
CREATININE: 1.35 mg/dL — AB (ref 0.50–1.10)
Calcium: 8.9 mg/dL (ref 8.4–10.5)
GFR calc Af Amer: 41 mL/min — ABNORMAL LOW (ref >90)
GFR calc non Af Amer: 35 mL/min — ABNORMAL LOW (ref >90)
Glucose, Bld: 86 mg/dL (ref 70–99)
Potassium: 5.3 mmol/L — ABNORMAL HIGH (ref 3.5–5.1)
Sodium: 136 mmol/L (ref 135–145)

## 2015-03-24 LAB — CBC WITH DIFFERENTIAL/PLATELET
Basophils Absolute: 0 10*3/uL (ref 0.0–0.1)
Basophils Relative: 0 % (ref 0–1)
Eosinophils Absolute: 0.1 10*3/uL (ref 0.0–0.7)
Eosinophils Relative: 2 % (ref 0–5)
HCT: 34 % — ABNORMAL LOW (ref 36.0–46.0)
Hemoglobin: 11.4 g/dL — ABNORMAL LOW (ref 12.0–15.0)
Lymphocytes Relative: 14 % (ref 12–46)
Lymphs Abs: 1.2 10*3/uL (ref 0.7–4.0)
MCH: 31.8 pg (ref 26.0–34.0)
MCHC: 33.5 g/dL (ref 30.0–36.0)
MCV: 94.7 fL (ref 78.0–100.0)
Monocytes Absolute: 0.7 10*3/uL (ref 0.1–1.0)
Monocytes Relative: 8 % (ref 3–12)
Neutro Abs: 6.1 10*3/uL (ref 1.7–7.7)
Neutrophils Relative %: 76 % (ref 43–77)
Platelets: 254 10*3/uL (ref 150–400)
RBC: 3.59 MIL/uL — ABNORMAL LOW (ref 3.87–5.11)
RDW: 13.3 % (ref 11.5–15.5)
WBC: 8.1 10*3/uL (ref 4.0–10.5)

## 2015-03-24 LAB — I-STAT CG4 LACTIC ACID, ED: Lactic Acid, Venous: 1.22 mmol/L (ref 0.5–2.0)

## 2015-03-24 LAB — I-STAT TROPONIN, ED: TROPONIN I, POC: 0 ng/mL (ref 0.00–0.08)

## 2015-03-24 MED ORDER — ACETAMINOPHEN 325 MG PO TABS
650.0000 mg | ORAL_TABLET | Freq: Once | ORAL | Status: AC
  Administered 2015-03-24: 650 mg via ORAL
  Filled 2015-03-24: qty 2

## 2015-03-24 MED ORDER — ACETAMINOPHEN 325 MG PO TABS
650.0000 mg | ORAL_TABLET | ORAL | Status: DC | PRN
  Administered 2015-03-29: 650 mg via ORAL
  Filled 2015-03-24: qty 2

## 2015-03-24 MED ORDER — FENTANYL CITRATE (PF) 100 MCG/2ML IJ SOLN
50.0000 ug | Freq: Once | INTRAMUSCULAR | Status: AC
  Administered 2015-03-24: 50 ug via INTRAVENOUS
  Filled 2015-03-24: qty 2

## 2015-03-24 MED ORDER — RISAQUAD PO CAPS
2.0000 | ORAL_CAPSULE | Freq: Three times a day (TID) | ORAL | Status: DC
  Administered 2015-03-25 – 2015-03-30 (×17): 2 via ORAL
  Filled 2015-03-24 (×22): qty 2

## 2015-03-24 MED ORDER — ONDANSETRON HCL 4 MG/2ML IJ SOLN: 4.0000 mg | Freq: Four times a day (QID) | INTRAMUSCULAR | Status: DC | PRN

## 2015-03-24 MED ORDER — VANCOMYCIN HCL IN DEXTROSE 1-5 GM/200ML-% IV SOLN
1000.0000 mg | INTRAVENOUS | Status: DC
  Filled 2015-03-24: qty 200

## 2015-03-24 MED ORDER — PRAVASTATIN SODIUM 80 MG PO TABS
80.0000 mg | ORAL_TABLET | Freq: Every day | ORAL | Status: DC
  Administered 2015-03-25 – 2015-03-29 (×5): 80 mg via ORAL
  Filled 2015-03-24 (×6): qty 1

## 2015-03-24 MED ORDER — CEFTRIAXONE SODIUM IN DEXTROSE 20 MG/ML IV SOLN
1.0000 g | INTRAVENOUS | Status: DC
  Administered 2015-03-25 – 2015-03-30 (×6): 1 g via INTRAVENOUS
  Filled 2015-03-24 (×6): qty 50

## 2015-03-24 MED ORDER — PANTOPRAZOLE SODIUM 40 MG PO TBEC
40.0000 mg | DELAYED_RELEASE_TABLET | Freq: Every day | ORAL | Status: DC
  Administered 2015-03-25 – 2015-03-30 (×6): 40 mg via ORAL
  Filled 2015-03-24 (×5): qty 1

## 2015-03-24 MED ORDER — IBUPROFEN 200 MG PO TABS: 800.0000 mg | ORAL_TABLET | Freq: Three times a day (TID) | ORAL | Status: DC | PRN

## 2015-03-24 MED ORDER — GABAPENTIN 300 MG PO CAPS
300.0000 mg | ORAL_CAPSULE | Freq: Every day | ORAL | Status: DC
  Administered 2015-03-24 – 2015-03-29 (×6): 300 mg via ORAL
  Filled 2015-03-24 (×8): qty 1

## 2015-03-24 MED ORDER — HEPARIN SODIUM (PORCINE) 5000 UNIT/ML IJ SOLN
5000.0000 [IU] | Freq: Three times a day (TID) | INTRAMUSCULAR | Status: DC
  Administered 2015-03-24 – 2015-03-30 (×15): 5000 [IU] via SUBCUTANEOUS
  Filled 2015-03-24 (×18): qty 1

## 2015-03-24 MED ORDER — SERTRALINE HCL 50 MG PO TABS
50.0000 mg | ORAL_TABLET | Freq: Every day | ORAL | Status: DC
  Administered 2015-03-25 – 2015-03-30 (×6): 50 mg via ORAL
  Filled 2015-03-24 (×6): qty 1

## 2015-03-24 MED ORDER — BACID PO TABS
2.0000 | ORAL_TABLET | Freq: Three times a day (TID) | ORAL | Status: DC
Start: 2015-03-24 — End: 2015-03-24
  Filled 2015-03-24 (×2): qty 2

## 2015-03-24 MED ORDER — DOCUSATE SODIUM 100 MG PO CAPS
100.0000 mg | ORAL_CAPSULE | Freq: Every day | ORAL | Status: DC
  Administered 2015-03-25 – 2015-03-30 (×6): 100 mg via ORAL
  Filled 2015-03-24 (×7): qty 1

## 2015-03-24 MED ORDER — ONDANSETRON HCL 4 MG PO TABS: 4.0000 mg | ORAL_TABLET | Freq: Four times a day (QID) | ORAL | Status: DC | PRN

## 2015-03-24 MED ORDER — ACETAMINOPHEN 650 MG RE SUPP: 650.0000 mg | RECTAL | Status: DC | PRN

## 2015-03-24 MED ORDER — VANCOMYCIN HCL IN DEXTROSE 1-5 GM/200ML-% IV SOLN
1000.0000 mg | Freq: Once | INTRAVENOUS | Status: AC
  Administered 2015-03-24: 1000 mg via INTRAVENOUS
  Filled 2015-03-24: qty 200

## 2015-03-24 MED ORDER — DEXTROSE 5 % IV SOLN
1.0000 g | Freq: Once | INTRAVENOUS | Status: AC
  Administered 2015-03-24: 1 g via INTRAVENOUS
  Filled 2015-03-24: qty 10

## 2015-03-24 MED ORDER — SODIUM CHLORIDE 0.9 % IV SOLN
INTRAVENOUS | Status: DC
  Administered 2015-03-25: 14:00:00 via INTRAVENOUS
  Administered 2015-03-25: 125 mL/h via INTRAVENOUS
  Administered 2015-03-25: 01:00:00 via INTRAVENOUS
  Administered 2015-03-26: 125 mL via INTRAVENOUS
  Administered 2015-03-27 – 2015-03-28 (×3): via INTRAVENOUS

## 2015-03-24 MED ORDER — SODIUM CHLORIDE 0.9 % IV BOLUS (SEPSIS)
500.0000 mL | Freq: Once | INTRAVENOUS | Status: AC
  Administered 2015-03-25: 500 mL via INTRAVENOUS

## 2015-03-24 MED ORDER — ASPIRIN 81 MG PO CHEW
81.0000 mg | CHEWABLE_TABLET | Freq: Every day | ORAL | Status: DC
  Administered 2015-03-25 – 2015-03-30 (×6): 81 mg via ORAL
  Filled 2015-03-24 (×7): qty 1

## 2015-03-24 MED ORDER — AMIODARONE HCL 100 MG PO TABS
100.0000 mg | ORAL_TABLET | Freq: Every day | ORAL | Status: DC
  Administered 2015-03-25 – 2015-03-30 (×6): 100 mg via ORAL
  Filled 2015-03-24 (×6): qty 1
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
## 2015-03-24 NOTE — ED Notes (Signed)
Daughter at bedside.  Home health nurse comes on Tuesdays.  Daughter packs and dresses wounds other days.  RUQ abscess incision in November, 2 months later LUQ "hole" opened.  Both have been packed and dressed 2x daily.  In mid abdominal fold center a new hole was noticed this morning daughter states it was gushing she packed and dressed.  Home health nurse advised to come to the ED for further evaluation.  Wounds documented.
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
## 2015-03-24 NOTE — Progress Notes (Signed)
Arrived to floor via stretcher accompanied by daughter to room 5w26; alert and able to state name and DOB.  Able to transfer to bed with assistance.  Made aware of use of call system and placed within reach.  Noticeable blood on gown from abdominal area.  Three open areas with depth noted to RUQ, upper middle, and RLQ; presently packed.  Will await treatment orders.  Will continue to monitor.
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
## 2015-03-24 NOTE — ED Notes (Signed)
Contacted phlebotomy for CBC redraw

## 2015-03-24 NOTE — ED Notes (Signed)
Pt here with purulent drainage from abd wound and some generalized CP; pt with fever at times
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
## 2015-03-24 NOTE — ED Notes (Signed)
Pt placed in a gown and hooked up to a 5 lead,BP cuff and pulse ox

## 2015-03-24 NOTE — ED Notes (Signed)
Report attempted 

## 2015-03-25 ENCOUNTER — Encounter (HOSPITAL_COMMUNITY): Payer: Self-pay | Admitting: General Practice

## 2015-03-25 LAB — COMPREHENSIVE METABOLIC PANEL
ALK PHOS: 92 U/L (ref 39–117)
ALT: 14 U/L (ref 0–35)
ANION GAP: 12 (ref 5–15)
AST: 22 U/L (ref 0–37)
Albumin: 2.2 g/dL — ABNORMAL LOW (ref 3.5–5.2)
BILIRUBIN TOTAL: 0.7 mg/dL (ref 0.3–1.2)
BUN: 21 mg/dL (ref 6–23)
CO2: 21 mmol/L (ref 19–32)
Calcium: 8.1 mg/dL — ABNORMAL LOW (ref 8.4–10.5)
Chloride: 105 mmol/L (ref 96–112)
Creatinine, Ser: 1.12 mg/dL — ABNORMAL HIGH (ref 0.50–1.10)
GFR calc Af Amer: 51 mL/min — ABNORMAL LOW (ref >90)
GFR, EST NON AFRICAN AMERICAN: 44 mL/min — AB (ref >90)
Glucose, Bld: 64 mg/dL — ABNORMAL LOW (ref 70–99)
Potassium: 3.1 mmol/L — ABNORMAL LOW (ref 3.5–5.1)
SODIUM: 138 mmol/L (ref 135–145)
Total Protein: 5.8 g/dL — ABNORMAL LOW (ref 6.0–8.3)

## 2015-03-25 LAB — CBC
HCT: 27.5 % — ABNORMAL LOW (ref 36.0–46.0)
HEMOGLOBIN: 9.3 g/dL — AB (ref 12.0–15.0)
MCH: 31.8 pg (ref 26.0–34.0)
MCHC: 33.8 g/dL (ref 30.0–36.0)
MCV: 94.2 fL (ref 78.0–100.0)
Platelets: 257 10*3/uL (ref 150–400)
RBC: 2.92 MIL/uL — AB (ref 3.87–5.11)
RDW: 13.4 % (ref 11.5–15.5)
WBC: 6.5 10*3/uL (ref 4.0–10.5)

## 2015-03-25 LAB — PROTIME-INR
INR: 1.2 (ref 0.00–1.49)
PROTHROMBIN TIME: 15.4 s — AB (ref 11.6–15.2)

## 2015-03-25 LAB — SEDIMENTATION RATE: Sed Rate: 115 mm/hr — ABNORMAL HIGH (ref 0–22)

## 2015-03-25 LAB — C-REACTIVE PROTEIN: CRP: 34.5 mg/dL — ABNORMAL HIGH (ref <0.60)

## 2015-03-25 MED ORDER — ONDANSETRON HCL 4 MG PO TABS: 4.0000 mg | ORAL_TABLET | Freq: Once | ORAL | Status: DC

## 2015-03-25 MED ORDER — VANCOMYCIN HCL 10 G IV SOLR
1500.0000 mg | INTRAVENOUS | Status: DC
  Administered 2015-03-25 – 2015-03-29 (×5): 1500 mg via INTRAVENOUS
  Filled 2015-03-25 (×6): qty 1500

## 2015-03-25 MED ORDER — GI COCKTAIL ~~LOC~~
30.0000 mL | Freq: Three times a day (TID) | ORAL | Status: DC | PRN
  Filled 2015-03-25: qty 30

## 2015-03-25 NOTE — Progress Notes (Signed)
[facility] PROGRESS NOTE  Jill Bowman KZL:[id number] DOB: [birth year] DOA: 03/24/2015 PCP: Henrine Screws, MD  Assessment/Plan: 1. Abdominal wall abscess 1. General surgery consulted and following, appreciate input 2. Will likely require surgery involving removal of previously placed mesh 3. Have consulted Cardiology for cardiac clearance given significant cardiac history 4. Currently remains on rocephin and vanc 5. Afebrile 6. No leukocytosis 2. HTN 1. BP stable 2. Cont current regimen 3. GERD 1. On PPI 2. On PRN GI cocktail 4. CAD 1. Stable 2. No chest pain currently 3. EKG unremarkable 5. Hx tachybrady syndrome 1. Pt is s/p pacemaker placement 2. Stable thus far 6. Mood disorder 1. On SSRI 2. Stable 7. DVT prophylaxis 1. Heparin subQ  Code Status: Full Family Communication: Pt in room, daughter at bedside Disposition Plan: pending   Consultants:  Cardiology  General Surgery  Procedures:    Antibiotics:  Vancomycin 4/27>>>  Rocephin 4/27>>>   HPI/Subjective: Pt is without complaints. Denies abd pain or chest pain.  Objective: Filed Vitals:   03/24/15 2145 03/25/15 0505 03/25/15 0508 03/25/15 1428  BP: 99/45  102/45 105/41  Pulse: 68  69 76  Temp: 98.7 F (37.1 C)  98.4 F (36.9 C) 98.5 F (36.9 C)  TempSrc: Oral  Oral Oral  Resp: 16  17 18   Height:      Weight: 85.3 kg (188 lb 0.8 oz) 84.687 kg (186 lb 11.2 oz)    SpO2: 100%  100% 100%    Intake/Output Summary (Last 24 hours) at 03/25/15 1653 Last data filed at 03/25/15 0918  Gross per 24 hour  Intake      0 ml  Output      0 ml  Net      0 ml   Filed Weights   03/24/15 1526 03/24/15 2145 03/25/15 0505  Weight: 83.462 kg (184 lb) 85.3 kg (188 lb 0.8 oz) 84.687 kg (186 lb 11.2 oz)    Exam:   General:  Awake, in nad  Cardiovascular: regular, s1, s2  Respiratory: normal resp effort, no wheezing  Abdomen: soft, nondistended, multiple open lesions over abd, not  currently draining  Musculoskeletal: perfused, no clubbing   Data Reviewed: Basic Metabolic Panel:  Recent Labs Lab 03/24/15 1138 03/24/15 1811 03/25/15 0549  NA 136 137 138  K 5.3* 3.7 3.1*  CL 105 102 105  CO2 18* 23 21  GLUCOSE 86 76 64*  BUN 30* 26* 21  CREATININE 1.35* 1.27* 1.12*  CALCIUM 8.9 8.8 8.1*   Liver Function Tests:  Recent Labs Lab 03/24/15 1811 03/25/15 0549  AST 23 22  ALT 15 14  ALKPHOS 113 92  BILITOT 0.5 0.7  PROT 6.8 5.8*  ALBUMIN 2.6* 2.2*   No results for input(s): LIPASE, AMYLASE in the last 168 hours. No results for input(s): AMMONIA in the last 168 hours. CBC:  Recent Labs Lab 03/24/15 1811 03/25/15 0549  WBC 8.1 6.5  NEUTROABS 6.1  --   HGB 11.4* 9.3*  HCT 34.0* 27.5*  MCV 94.7 94.2  PLT 254 257   Cardiac Enzymes: No results for input(s): CKTOTAL, CKMB, CKMBINDEX, TROPONINI in the last 168 hours. BNP (last 3 results) No results for input(s): BNP in the last 8760 hours.  ProBNP (last 3 results) No results for input(s): PROBNP in the last 8760 hours.  CBG: No results for input(s): GLUCAP in the last 168 hours.  Recent Results (from the past 240 hour(s))  Wound culture     Status: None (Preliminary  result)   Collection Time: 03/24/15  4:38 PM  Result Value Ref Range Status   Specimen Description WOUND ABDOMEN  Final   Special Requests NONE  Final   Gram Stain   Final    ABUNDANT WBC PRESENT,BOTH PMN AND MONONUCLEAR NO SQUAMOUS EPITHELIAL CELLS SEEN ABUNDANT GRAM POSITIVE COCCI IN PAIRS RARE GRAM NEGATIVE RODS Performed at [facility]    Culture NO GROWTH Performed at [facility]   Final   Report Status PENDING  Incomplete     Studies: Dg Chest 2 View  03/24/2015   CLINICAL DATA:  Chest pain  EXAM: CHEST  2 VIEW  COMPARISON:  October 19, 2014  FINDINGS: There is no edema or consolidation. Heart size and pulmonary vascularity are normal. Pacemaker leads are attached to the right atrium and right  ventricle. No adenopathy. There is degenerative change in the thoracic spine. There is atherosclerotic change in the aortic arch region.  IMPRESSION: No edema or consolidation.   Electronically Signed   By: Lowella Grip III M.D.   On: 03/24/2015 13:30    Scheduled Meds: . acidophilus  2 capsule Oral TID  . amiodarone  100 mg Oral Daily  . aspirin  81 mg Oral Daily  . cefTRIAXone (ROCEPHIN)  IV  1 g Intravenous Q24H  . docusate sodium  100 mg Oral Daily  . gabapentin  300 mg Oral QHS  . heparin  5,000 Units Subcutaneous 3 times per day  . pantoprazole  40 mg Oral QAC breakfast  . pravastatin  80 mg Oral q1800  . sertraline  50 mg Oral Daily  . vancomycin  1,500 mg Intravenous Q24H   Continuous Infusions: . sodium chloride 125 mL/hr at 03/25/15 1421    Principal Problem:   Abdominal wall abscess Active Problems:   Tachycardia-bradycardia syndrome   CAD (coronary artery disease)   Pacemaker   Hypotension   CHIU, STEPHEN K  [facility] Pager [phone]. If 7PM-7AM, please contact night-coverage at [email or web address], password [facility] 03/25/2015, 4:53 PM  LOS: 1 day
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
## 2015-03-25 NOTE — Progress Notes (Signed)
[facility] Progress Note     Subjective: Pt denies fever/chills or N/V/D.  Says the wounds hurt some, but not that bad.  She looks forward to talking to Dr. Rosendo Gros  Objective: Vital signs in last 24 hours: Temp:  [98.4 F (36.9 C)-98.7 F (37.1 C)] 98.4 F (36.9 C) (04/28 0508) Pulse Rate:  [68-90] 69 (04/28 0508) Resp:  [13-22] 17 (04/28 0508) BP: (87-142)/(30-75) 102/45 mmHg (04/28 0508) SpO2:  [90 %-100 %] 100 % (04/28 0508) Weight:  [83.462 kg (184 lb)-85.3 kg (188 lb 0.8 oz)] 84.687 kg (186 lb 11.2 oz) (04/28 0505)    Intake/Output from previous day:   Intake/Output this shift:    PE: Gen:  Alert, NAD, pleasant Abd: Soft, ND, mild tenderness, +BS, 3 chronic wounds: 1.  Right periumbilical - draining brown foul smelling purulent fluid 2.  Infraumbilical - draining brown foul smelling purulent fluid 3.  Suprapubic (under panus) - draining brown foul smelling purulent fluid   Lab Results:   Recent Labs  03/24/15 1811 03/25/15 0549  WBC 8.1 6.5  HGB 11.4* 9.3*  HCT 34.0* 27.5*  PLT 254 257   BMET  Recent Labs  03/24/15 1811 03/25/15 0549  NA 137 138  K 3.7 3.1*  CL 102 105  CO2 23 21  GLUCOSE 76 64*  BUN 26* 21  CREATININE 1.27* 1.12*  CALCIUM 8.8 8.1*   PT/INR  Recent Labs  03/25/15 0549  LABPROT 15.4*  INR 1.20   CMP     Component Value Date/Time   NA 138 03/25/2015 0549   K 3.1* 03/25/2015 0549   CL 105 03/25/2015 0549   CO2 21 03/25/2015 0549   GLUCOSE 64* 03/25/2015 0549   BUN 21 03/25/2015 0549   CREATININE 1.12* 03/25/2015 0549   CALCIUM 8.1* 03/25/2015 0549   CALCIUM 8.2* 09/11/2012 0555   PROT 5.8* 03/25/2015 0549   ALBUMIN 2.2* 03/25/2015 0549   AST 22 03/25/2015 0549   ALT 14 03/25/2015 0549   ALKPHOS 92 03/25/2015 0549   BILITOT 0.7 03/25/2015 0549   GFRNONAA 44* 03/25/2015 0549   GFRAA 51* 03/25/2015 0549   Lipase     Component Value Date/Time   LIPASE 48 08/16/2012 1920        Studies/Results: Dg Chest 2 View  03/24/2015   CLINICAL DATA:  Chest pain  EXAM: CHEST  2 VIEW  COMPARISON:  October 19, 2014  FINDINGS: There is no edema or consolidation. Heart size and pulmonary vascularity are normal. Pacemaker leads are attached to the right atrium and right ventricle. No adenopathy. There is degenerative change in the thoracic spine. There is atherosclerotic change in the aortic arch region.  IMPRESSION: No edema or consolidation.   Electronically Signed   By: Lowella Grip III M.D.   On: 03/24/2015 13:30    Anti-infectives: Anti-infectives    Start     Dose/Rate Route Frequency Ordered Stop   03/25/15 1800  vancomycin (VANCOCIN) IVPB 1000 mg/200 mL premix     1,000 mg 200 mL/hr over 60 Minutes Intravenous Every 24 hours 03/24/15 1815     03/25/15 1000  cefTRIAXone (ROCEPHIN) 1 g in dextrose 5 % 50 mL IVPB - Premix     1 g 100 mL/hr over 30 Minutes Intravenous Every 24 hours 03/24/15 2234     03/24/15 1745  vancomycin (VANCOCIN) IVPB 1000 mg/200 mL premix     1,000 mg 200 mL/hr over 60 Minutes Intravenous  Once 03/24/15 1732 03/24/15 2102  03/24/15 1730  cefTRIAXone (ROCEPHIN) 1 g in dextrose 5 % 50 mL IVPB     1 g 100 mL/hr over 30 Minutes Intravenous  Once 03/24/15 1722 03/24/15 1852       Assessment/Plan Chronic, draining abdominal wall sinuses without demonstration of connection to the GI track.  -Very likely that these infections are related to the mesh that was put in place in [place] for ventral hernia repair years ago.  -This will not likely resolve without some type of radical surgery to remove the mesh and leave the wound open.   -The patient has a number of medical problems -Talked with Dr. Wyline Copas who will consult cardiology for clearance -Will discuss with Dr. Rosendo Gros her primary surgeon -Okay to start clears and advance as tolerated -IV antibiotics - rocephin and vancomycin Day #2, await cultures (gram neg rods, gram positive  cocci in pairs) -Start WD dressing changes BID -Heparin and SCD's    LOS: 1 day    DORT, MEGAN 03/25/2015, 9:30 AM Pager: [phone]  Complicated issue with likely infected mesh.  Will be a complex abdominal wall reconstruction with explantation of the mesh if these infections recur.  May need transfer to tertiary center.  Will discuss with her primary surgeon.  Imogene Burn. Georgette Dover, MD, [facility]  General/ Trauma Surgery  03/25/2015 11:23 AM

## 2015-03-25 NOTE — Progress Notes (Signed)
[facility]  Patient Status: Active (receiving services up to time of hospitalization)  [facility] is providing the following services: RN  If patient discharges after hours, please call [phone].   Consepcion Hearing 03/25/2015, 11:06 AM

## 2015-03-25 NOTE — Progress Notes (Signed)
Utilization review complete 

## 2015-03-26 LAB — BASIC METABOLIC PANEL
Anion gap: 10 (ref 5–15)
BUN: 10 mg/dL (ref 6–23)
CALCIUM: 8 mg/dL — AB (ref 8.4–10.5)
CO2: 21 mmol/L (ref 19–32)
CREATININE: 0.84 mg/dL (ref 0.50–1.10)
Chloride: 108 mmol/L (ref 96–112)
GFR calc Af Amer: 72 mL/min — ABNORMAL LOW (ref >90)
GFR calc non Af Amer: 63 mL/min — ABNORMAL LOW (ref >90)
Glucose, Bld: 82 mg/dL (ref 70–99)
Potassium: 3.3 mmol/L — ABNORMAL LOW (ref 3.5–5.1)
Sodium: 139 mmol/L (ref 135–145)

## 2015-03-26 LAB — CBC
HCT: 26 % — ABNORMAL LOW (ref 36.0–46.0)
Hemoglobin: 8.6 g/dL — ABNORMAL LOW (ref 12.0–15.0)
MCH: 31.4 pg (ref 26.0–34.0)
MCHC: 33.1 g/dL (ref 30.0–36.0)
MCV: 94.9 fL (ref 78.0–100.0)
PLATELETS: 235 10*3/uL (ref 150–400)
RBC: 2.74 MIL/uL — ABNORMAL LOW (ref 3.87–5.11)
RDW: 13.5 % (ref 11.5–15.5)
WBC: 5 10*3/uL (ref 4.0–10.5)

## 2015-03-26 LAB — PREALBUMIN: PREALBUMIN: 9 mg/dL — AB (ref 17–34)

## 2015-03-26 MED ORDER — ZOLPIDEM TARTRATE 5 MG PO TABS
5.0000 mg | ORAL_TABLET | Freq: Every evening | ORAL | Status: DC | PRN
  Administered 2015-03-26 – 2015-03-27 (×2): 5 mg via ORAL
  Filled 2015-03-26 (×2): qty 1

## 2015-03-26 MED ORDER — POTASSIUM CHLORIDE CRYS ER 20 MEQ PO TBCR
40.0000 meq | EXTENDED_RELEASE_TABLET | Freq: Two times a day (BID) | ORAL | Status: AC
  Administered 2015-03-26 (×2): 40 meq via ORAL
  Filled 2015-03-26 (×2): qty 2

## 2015-03-26 NOTE — Progress Notes (Signed)
[facility] Progress Note     Subjective: Pt doing well, no severe pain.  Wounds are still draining pus but improving.  Packing going well.  Pt up ambulating well.  Tolerating diet.    Objective: Vital signs in last 24 hours: Temp:  [98.2 F (36.8 C)-98.9 F (37.2 C)] 98.2 F (36.8 C) (04/29 0522) Pulse Rate:  [76] 76 (04/28 1428) Resp:  [18-20] 20 (04/29 0522) BP: (103-110)/(33-41) 103/33 mmHg (04/29 0522) SpO2:  [92 %-100 %] 95 % (04/29 0522) Weight:  [84.1 kg (185 lb 6.5 oz)] 84.1 kg (185 lb 6.5 oz) (04/29 0535) Last BM Date: 03/24/15  Intake/Output from previous day: 04/28 0701 - 04/29 0700 In: 3 [P.O.:660] Out: -  Intake/Output this shift: Total I/O In: 180 [P.O.:180] Out: -   PE: Gen: Alert, NAD, pleasant Abd: Soft, ND, mild tenderness, +BS, 3 chronic wounds: 1. Right periumbilical - cleaner than yesterday, draining brown purulent fluid 2. Infraumbilical - draining brown purulent fluid 3. Suprapubic (under panus) - draining brown  purulent fluid  Lab Results:   Recent Labs  03/25/15 0549 03/26/15 0650  WBC 6.5 5.0  HGB 9.3* 8.6*  HCT 27.5* 26.0*  PLT 257 235   BMET  Recent Labs  03/25/15 0549 03/26/15 0650  NA 138 139  K 3.1* 3.3*  CL 105 108  CO2 21 21  GLUCOSE 64* 82  BUN 21 10  CREATININE 1.12* 0.84  CALCIUM 8.1* 8.0*   PT/INR  Recent Labs  03/25/15 0549  LABPROT 15.4*  INR 1.20   CMP     Component Value Date/Time   NA 139 03/26/2015 0650   K 3.3* 03/26/2015 0650   CL 108 03/26/2015 0650   CO2 21 03/26/2015 0650   GLUCOSE 82 03/26/2015 0650   BUN 10 03/26/2015 0650   CREATININE 0.84 03/26/2015 0650   CALCIUM 8.0* 03/26/2015 0650   CALCIUM 8.2* 09/11/2012 0555   PROT 5.8* 03/25/2015 0549   ALBUMIN 2.2* 03/25/2015 0549   AST 22 03/25/2015 0549   ALT 14 03/25/2015 0549   ALKPHOS 92 03/25/2015 0549   BILITOT 0.7 03/25/2015 0549   GFRNONAA 63* 03/26/2015 0650   GFRAA 72* 03/26/2015 0650   Lipase      Component Value Date/Time   LIPASE 48 08/16/2012 1920       Studies/Results: Dg Chest 2 View  03/24/2015   CLINICAL DATA:  Chest pain  EXAM: CHEST  2 VIEW  COMPARISON:  October 19, 2014  FINDINGS: There is no edema or consolidation. Heart size and pulmonary vascularity are normal. Pacemaker leads are attached to the right atrium and right ventricle. No adenopathy. There is degenerative change in the thoracic spine. There is atherosclerotic change in the aortic arch region.  IMPRESSION: No edema or consolidation.   Electronically Signed   By: Lowella Grip III M.D.   On: 03/24/2015 13:30    Anti-infectives: Anti-infectives    Start     Dose/Rate Route Frequency Ordered Stop   03/25/15 1800  vancomycin (VANCOCIN) IVPB 1000 mg/200 mL premix  Status:  Discontinued     1,000 mg 200 mL/hr over 60 Minutes Intravenous Every 24 hours 03/24/15 1815 03/25/15 1244   03/25/15 1800  vancomycin (VANCOCIN) 1,500 mg in sodium chloride 0.9 % 500 mL IVPB     1,500 mg 250 mL/hr over 120 Minutes Intravenous Every 24 hours 03/25/15 1244     03/25/15 1000  cefTRIAXone (ROCEPHIN) 1 g in dextrose 5 % 50 mL IVPB -  Premix     1 g 100 mL/hr over 30 Minutes Intravenous Every 24 hours 03/24/15 2234     03/24/15 1745  vancomycin (VANCOCIN) IVPB 1000 mg/200 mL premix     1,000 mg 200 mL/hr over 60 Minutes Intravenous  Once 03/24/15 1732 03/24/15 2102   03/24/15 1730  cefTRIAXone (ROCEPHIN) 1 g in dextrose 5 % 50 mL IVPB     1 g 100 mL/hr over 30 Minutes Intravenous  Once 03/24/15 1722 03/24/15 1852       Assessment/Plan Chronic, draining abdominal wall sinuses without demonstration of connection to the GI track.  -Very likely that these infections are related to the mesh that was put in place in [place] for ventral hernia repair years ago.  -This will not likely resolve without some type of radical surgery to remove the mesh and leave the wound open.  This kind [facility]  Dr. Rosendo Gros can see her on Saturday since she is his patient.   -The patient has a number of medical problems -Soft diet is fine -IV antibiotics - rocephin and vancomycin Day #3, await cultures (gram neg rods, gram positive cocci in pairs) -Continue WD dressing changes BID -Heparin and SCD's    LOS: 2 days    Coralie Keens 03/26/2015, 9:55 AM Pager: [phone]
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
## 2015-03-26 NOTE — Progress Notes (Signed)
[facility] PROGRESS NOTE  [name] KDX:[id number] DOB: 1929-04-28 DOA: 03/24/2015 PCP: Henrine Screws, MD  Assessment/Plan: 1. Abdominal wall abscess 1. General surgery consulted and following, appreciate input 2. If surgical intervention required, possible consideration for transfer to a tertiary care center per Surgery recs 3. Consulted Cardiology for cardiac clearance. Pt is cleared for surgery if indicated 4. Currently remains on rocephin and vanc 5. Afebrile 6. No leukocytosis 2. HTN 1. BP stable 2. Cont current regimen 3. GERD 1. On PPI 2. On PRN GI cocktail 4. CAD 1. Remains stable 2. No chest pain currently 3. EKG unremarkable 5. Hx tachybrady syndrome 1. Pt is s/p pacemaker placement 2. Stable thus far 6. Mood disorder 1. On SSRI 2. Stable 7. DVT prophylaxis 1. Heparin subQ  Code Status: Full Family Communication: Pt in room, daughter in hall Disposition Plan: pending   Consultants:  Cardiology  General Surgery  Procedures:    Antibiotics:  Vancomycin 4/27>>>  Rocephin 4/27>>>   HPI/Subjective: Pt reports feeling well today. No complaints  Objective: Filed Vitals:   03/25/15 2152 03/26/15 0522 03/26/15 0535 03/26/15 1417  BP: 110/40 103/33  97/40  Pulse:    69  Temp: 98.9 F (37.2 C) 98.2 F (36.8 C)  98.8 F (37.1 C)  TempSrc: Oral Oral  Oral  Resp: 20 20  14   Height:      Weight:   84.1 kg (185 lb 6.5 oz)   SpO2: 92% 95%  100%    Intake/Output Summary (Last 24 hours) at 03/26/15 1838 Last data filed at 03/26/15 1803  Gross per 24 hour  Intake 2681.25 ml  Output      0 ml  Net 2681.25 ml   Filed Weights   03/24/15 2145 03/25/15 0505 03/26/15 0535  Weight: 85.3 kg (188 lb 0.8 oz) 84.687 kg (186 lb 11.2 oz) 84.1 kg (185 lb 6.5 oz)    Exam:   General:  Asleep, easily arousable, in nad  Cardiovascular: regular, s1, s2  Respiratory: normal resp effort, no wheezing, no crackles  Abdomen: soft,  nondistended, multiple open lesions over abd, not currently draining  Musculoskeletal: perfused, no clubbing   Data Reviewed: Basic Metabolic Panel:  Recent Labs Lab 03/24/15 1138 03/24/15 1811 03/25/15 0549 03/26/15 0650  NA 136 137 138 139  K 5.3* 3.7 3.1* 3.3*  CL 105 102 105 108  CO2 18* 23 21 21   GLUCOSE 86 76 64* 82  BUN 30* 26* 21 10  CREATININE 1.35* 1.27* 1.12* 0.84  CALCIUM 8.9 8.8 8.1* 8.0*   Liver Function Tests:  Recent Labs Lab 03/24/15 1811 03/25/15 0549  AST 23 22  ALT 15 14  ALKPHOS 113 92  BILITOT 0.5 0.7  PROT 6.8 5.8*  ALBUMIN 2.6* 2.2*   No results for input(s): LIPASE, AMYLASE in the last 168 hours. No results for input(s): AMMONIA in the last 168 hours. CBC:  Recent Labs Lab 03/24/15 1811 03/25/15 0549 03/26/15 0650  WBC 8.1 6.5 5.0  NEUTROABS 6.1  --   --   HGB 11.4* 9.3* 8.6*  HCT 34.0* 27.5* 26.0*  MCV 94.7 94.2 94.9  PLT 254 257 235   Cardiac Enzymes: No results for input(s): CKTOTAL, CKMB, CKMBINDEX, TROPONINI in the last 168 hours. BNP (last 3 results) No results for input(s): BNP in the last 8760 hours.  ProBNP (last 3 results) No results for input(s): PROBNP in the last 8760 hours.  CBG: No results for input(s): GLUCAP in the last 168 hours.  Recent Results (from the past 240 hour(s))  Wound culture     Status: None (Preliminary result)   Collection Time: 03/24/15  4:38 PM  Result Value Ref Range Status   Specimen Description WOUND ABDOMEN  Final   Special Requests NONE  Final   Gram Stain   Final    ABUNDANT WBC PRESENT,BOTH PMN AND MONONUCLEAR NO SQUAMOUS EPITHELIAL CELLS SEEN ABUNDANT GRAM POSITIVE COCCI IN PAIRS RARE GRAM NEGATIVE RODS Performed at [facility]    Culture   Final    Culture reincubated for better growth Performed at [facility]    Report Status PENDING  Incomplete  Culture, blood (routine x 2)     Status: None (Preliminary result)   Collection Time: 03/24/15 11:26 PM   Result Value Ref Range Status   Specimen Description BLOOD LEFT ARM  Final   Special Requests BOTTLES DRAWN AEROBIC AND ANAEROBIC 5CC EACH  Final   Culture   Final           BLOOD CULTURE RECEIVED NO GROWTH TO DATE CULTURE WILL BE HELD FOR 5 DAYS BEFORE ISSUING A FINAL NEGATIVE REPORT Performed at [facility]    Report Status PENDING  Incomplete  Culture, blood (routine x 2)     Status: None (Preliminary result)   Collection Time: 03/24/15 11:37 PM  Result Value Ref Range Status   Specimen Description BLOOD RIGHT ANTECUBITAL  Final   Special Requests BOTTLES DRAWN AEROBIC AND ANAEROBIC 5CC EACH  Final   Culture   Final           BLOOD CULTURE RECEIVED NO GROWTH TO DATE CULTURE WILL BE HELD FOR 5 DAYS BEFORE ISSUING A FINAL NEGATIVE REPORT Performed at [facility]    Report Status PENDING  Incomplete     Studies: No results found.  Scheduled Meds: . acidophilus  2 capsule Oral TID  . amiodarone  100 mg Oral Daily  . aspirin  81 mg Oral Daily  . cefTRIAXone (ROCEPHIN)  IV  1 g Intravenous Q24H  . docusate sodium  100 mg Oral Daily  . gabapentin  300 mg Oral QHS  . heparin  5,000 Units Subcutaneous 3 times per day  . pantoprazole  40 mg Oral QAC breakfast  . potassium chloride  40 mEq Oral BID  . pravastatin  80 mg Oral q1800  . sertraline  50 mg Oral Daily  . vancomycin  1,500 mg Intravenous Q24H   Continuous Infusions: . sodium chloride 125 mL (03/26/15 1223)    Principal Problem:   Abdominal wall abscess Active Problems:   Tachycardia-bradycardia syndrome   CAD (coronary artery disease)   Pacemaker   Hypotension   Michae Grimley, [place] [facility] Pager [phone]. If 7PM-7AM, please contact night-coverage at [email or web address], password [facility] 03/26/2015, 6:38 PM  LOS: 2 days

## 2015-03-26 NOTE — Evaluation (Addendum)
Physical Therapy Evaluation Patient Details Name: Jill Bowman MRN: [id number] DOB: [birth year] Today's Date: 03/26/2015   History of Present Illness  pt admitted with recurrent abdominal abcesses likely due to abdominal mess placement.  Clinical Impression  Pt admitted with/for drainage from Abd abcesses.  Pt currently limited functionally due to the problems listed below.  (see problems list.)  Pt will benefit from PT to maximize function and safety to be able to get home safely with available assist of family.     Follow Up Recommendations Home health PT    Equipment Recommendations  None recommended by PT    Recommendations for Other Services       Precautions / Restrictions Precautions Precautions: Fall      Mobility  Bed Mobility Overal bed mobility: Modified Independent                Transfers Overall transfer level: Needs assistance   Transfers: Sit to/from Stand Sit to Stand: Supervision            Ambulation/Gait Ambulation/Gait assistance: Supervision Ambulation Distance (Feet): 180 Feet Assistive device: Rolling walker (2 wheeled) Gait Pattern/deviations: Step-through pattern   Gait velocity interpretation: at or above normal speed for age/gender General Gait Details: steady and moderate speed.  Stairs            Wheelchair Mobility    Modified Rankin (Stroke Patients Only)       Balance Overall balance assessment: Needs assistance Sitting-balance support: No upper extremity supported Sitting balance-Leahy Scale: Good     Standing balance support: No upper extremity supported Standing balance-Leahy Scale: Fair                               Pertinent Vitals/Pain Pain Assessment: No/denies pain    Home Living Family/patient expects to be discharged to:: Private residence Living Arrangements: Children Available Help at Discharge: Family;Available 24 hours/day Type of Home: House Home Access: Stairs to  enter Entrance Stairs-Rails: Psychiatric nurse of Steps: 3-4 Home Layout: One level Home Equipment: Walker - 2 wheels;Bedside commode;Shower seat      Prior Function Level of Independence: Independent with assistive device(s)         Comments: Per patient, she did not need any assist with ADL's pta.  Unsure of accuracy of information.     Hand Dominance        Extremity/Trunk Assessment   Upper Extremity Assessment: Defer to OT evaluation           Lower Extremity Assessment: Overall WFL for tasks assessed (mildly weak proximal musculature)      Cervical / Trunk Assessment: Normal  Communication   Communication: No difficulties  Cognition Arousal/Alertness: Awake/alert Behavior During Therapy: WFL for tasks assessed/performed Overall Cognitive Status: Within Functional Limits for tasks assessed                      General Comments      Exercises        Assessment/Plan    PT Assessment Patient needs continued PT services  PT Diagnosis Generalized weakness   PT Problem List Decreased strength;Decreased activity tolerance;Decreased balance;Decreased mobility;Decreased knowledge of use of DME  PT Treatment Interventions DME instruction;Gait training;Stair training;Functional mobility training;Therapeutic activities;Patient/family education;Balance training   PT Goals (Current goals can be found in the Care Plan section) Acute Rehab PT Goals Patient Stated Goal: indepedent after the possible surgeru PT Goal Formulation: With patient  Time For Goal Achievement: 04/09/15 Potential to Achieve Goals: Good    Frequency Min 3X/week   Barriers to discharge        Co-evaluation               End of Session   Activity Tolerance: Patient tolerated treatment well Patient left: in bed;with call bell/phone within reach;with family/visitor present Nurse Communication: Mobility status         Time: 1937-9024 PT Time  Calculation (min) (ACUTE ONLY): 36 min   Charges:   PT Evaluation $Initial PT Evaluation Tier I: 1 Procedure PT Treatments $Gait Training: 8-22 mins   PT G Codes:        Kenora Spayd, Tessie Fass 03/26/2015, 5:10 PM  03/26/2015  Donnella Sham, PT [phone]  (pager)

## 2015-03-26 NOTE — Care Management Note (Addendum)
    Page 1 of 2   03/30/2015     2:26:35 PM CARE MANAGEMENT NOTE 03/30/2015  Patient:  Jill Bowman, Jill Bowman   Account Number:  [id number]  Date Initiated:  03/26/2015  Documentation initiated by:  Tomi Bamberger  Subjective/Objective Assessment:   dx abd wall abscess  admit - live alone in [place], here visiting daughter.     Action/Plan:   pt eval-   Anticipated DC Date:  03/30/2015   Anticipated DC Plan:  [place]  CM consult      [facility] Choice  Resumption Of Svcs/PTA Provider   Choice offered to / List presented to:  C-4 Adult Children        HH arranged  HH-1 RN  [place].   Status of service:  Completed, signed off Medicare Important Message given?  YES (If response is "NO", the following Medicare IM given date fields will be blank) Date Medicare IM given:  03/26/2015 Medicare IM given by:  Tomi Bamberger Date Additional Medicare IM given:  03/29/2015 Additional Medicare IM given by:  Tomi Bamberger  Discharge Disposition:  [place]  Per UR Regulation:  Reviewed for med. necessity/level of care/duration of stay  If discussed at [place] of Stay Meetings, dates discussed:    Comments:  03/30/15  [place], BSN [phone] patient is for dc today, NCM spoke with daughter, she states patient is active with [facility] , referral made to [facility] to resume ,HHRN, will add HHPT and aide.  Soc will begin 24-48 hrs post dc.  03/29/15 [place] RN, BSN [phone] patient would [facility] to go [facility], she is for possible transfer today.  03/26/15 [place], BSN [phone] patient has abd wall abscess, NCM will cont to follow for dc needs.  Per Surgery note this  surgery may be neded to be done at university setting such as [place], [place] or [place].

## 2015-03-27 DIAGNOSIS — I10 Essential (primary) hypertension: Secondary | ICD-10-CM

## 2015-03-27 LAB — BASIC METABOLIC PANEL
Anion gap: 8 (ref 5–15)
BUN: 8 mg/dL (ref 6–23)
CALCIUM: 8.1 mg/dL — AB (ref 8.4–10.5)
CO2: 19 mmol/L (ref 19–32)
Chloride: 114 mmol/L — ABNORMAL HIGH (ref 96–112)
Creatinine, Ser: 0.8 mg/dL (ref 0.50–1.10)
GFR calc Af Amer: 77 mL/min — ABNORMAL LOW (ref >90)
GFR, EST NON AFRICAN AMERICAN: 66 mL/min — AB (ref >90)
Glucose, Bld: 90 mg/dL (ref 70–99)
Potassium: 4.5 mmol/L (ref 3.5–5.1)
SODIUM: 141 mmol/L (ref 135–145)

## 2015-03-27 LAB — CBC
HCT: 27.2 % — ABNORMAL LOW (ref 36.0–46.0)
Hemoglobin: 9 g/dL — ABNORMAL LOW (ref 12.0–15.0)
MCH: 31.7 pg (ref 26.0–34.0)
MCHC: 33.1 g/dL (ref 30.0–36.0)
MCV: 95.8 fL (ref 78.0–100.0)
Platelets: 227 10*3/uL (ref 150–400)
RBC: 2.84 MIL/uL — ABNORMAL LOW (ref 3.87–5.11)
RDW: 13.5 % (ref 11.5–15.5)
WBC: 5.3 10*3/uL (ref 4.0–10.5)

## 2015-03-27 NOTE — Progress Notes (Signed)
  Subjective: Pt with no acute changes overnight  Objective: Vital signs in last 24 hours: Temp:  [98.4 F (36.9 C)-98.8 F (37.1 C)] 98.7 F (37.1 C) (04/30 0546) Pulse Rate:  [69] 69 (04/29 1417) Resp:  [14-20] 16 (04/30 0546) BP: (97-112)/(35-40) 112/39 mmHg (04/30 0546) SpO2:  [100 %] 100 % (04/30 0546) Weight:  [88.451 kg (195 lb)] 88.451 kg (195 lb) (04/30 0503) Last BM Date: 03/26/15  Intake/Output from previous day: 04/29 0701 - 04/30 0700 In: 2021.3 [P.O.:640; I.V.:1381.3] Out: -  Intake/Output this shift: Total I/O In: 240 [P.O.:240] Out: -   General appearance: alert and cooperative GI: soft, ttp in midline over draining sinuses, brownish/SS drainage  Lab Results:   Recent Labs  03/26/15 0650 03/27/15 0640  WBC 5.0 5.3  HGB 8.6* 9.0*  HCT 26.0* 27.2*  PLT 235 227   BMET  Recent Labs  03/26/15 0650 03/27/15 0640  NA 139 141  K 3.3* 4.5  CL 108 114*  CO2 21 19  GLUCOSE 82 90  BUN 10 8  CREATININE 0.84 0.80  CALCIUM 8.0* 8.1*   PT/INR  Recent Labs  03/25/15 0549  LABPROT 15.4*  INR 1.20    Anti-infectives: Anti-infectives    Start     Dose/Rate Route Frequency Ordered Stop   03/25/15 1800  vancomycin (VANCOCIN) IVPB 1000 mg/200 mL premix  Status:  Discontinued     1,000 mg 200 mL/hr over 60 Minutes Intravenous Every 24 hours 03/24/15 1815 03/25/15 1244   03/25/15 1800  vancomycin (VANCOCIN) 1,500 mg in sodium chloride 0.9 % 500 mL IVPB     1,500 mg 250 mL/hr over 120 Minutes Intravenous Every 24 hours 03/25/15 1244     03/25/15 1000  cefTRIAXone (ROCEPHIN) 1 g in dextrose 5 % 50 mL IVPB - Premix     1 g 100 mL/hr over 30 Minutes Intravenous Every 24 hours 03/24/15 2234     03/24/15 1745  vancomycin (VANCOCIN) IVPB 1000 mg/200 mL premix     1,000 mg 200 mL/hr over 60 Minutes Intravenous  Once 03/24/15 1732 03/24/15 2102   03/24/15 1730  cefTRIAXone (ROCEPHIN) 1 g in dextrose 5 % 50 mL IVPB     1 g 100 mL/hr over 30 Minutes  Intravenous  Once 03/24/15 1722 03/24/15 1852      Assessment/Plan: Chronic mesh infection with draining sinuses, MMP I had a long d/w the patient and her daughter in regard to her current condition and the chronic mesh infection.  Secondary to her MMP and high chance of morbidity, she would be best served at a teritary center to treat her infected mesh, which would likely require explantation and repair of the hernia.  The patient and her family are OK with the plan and looking forward to proceeding with future care.    LOS: 3 days    Rosario Jacks., [facility] 03/27/2015

## 2015-03-27 NOTE — Progress Notes (Signed)
Called and discussed the case and need for transfer for escalation of care with the on call surgeon at [facility].  They stated they needed to wait until Monday for financial clearance to be obtain prior to transfer.  The transfer will have to be reinitiated again Monday according to their transfer center.

## 2015-03-27 NOTE — Progress Notes (Signed)
[facility] PROGRESS NOTE  Jill Bowman GEX:[id number] DOB: 03-08-1929 DOA: 03/24/2015 PCP: Henrine Screws, MD  Assessment/Plan: 1. Abdominal wall abscess 1. General surgery consulted and following, appreciate input 2. Per surgery, pt would benefit from transfer to a tertiary care center. See Surgery notes. Transfer to [facility] would have to wait until Monday for financial clearance prior to transfer.  3. Had consulted Cardiology for cardiac clearance and pt is cleared for surgery if indicated 4. Currently remains on rocephin and vanc 2. HTN 1. BP remains stable 2. Cont current regimen 3. GERD 1. On PPI 2. On PRN GI cocktail 4. CAD 1. Remains stable 2. Remains without chest pain 3. EKG unremarkable 5. Hx tachybrady syndrome 1. Pt is s/p pacemaker placement 2. Stable thus far 6. Mood disorder 1. On SSRI 2. Stable 7. DVT prophylaxis 1. Heparin subQ  Code Status: Full Family Communication: Pt in room, daughter at bedside Disposition Plan: pending   Consultants:  Cardiology  General Surgery  Procedures:    Antibiotics:  Vancomycin 4/27>>>  Rocephin 4/27>>>   HPI/Subjective: No complaints today. Reports continued drainage of abd however  Objective: Filed Vitals:   03/26/15 2124 03/27/15 0503 03/27/15 0546 03/27/15 1347  BP: 111/35  112/39 134/53  Pulse:   72 72  Temp: 98.4 F (36.9 C)  98.7 F (37.1 C) 98.3 F (36.8 C)  TempSrc: Oral  Oral Oral  Resp: 20  16 18   Height:      Weight:  88.451 kg (195 lb)    SpO2: 100%  100% 100%    Intake/Output Summary (Last 24 hours) at 03/27/15 1528 Last data filed at 03/27/15 0956  Gross per 24 hour  Intake 1841.25 ml  Output      0 ml  Net 1841.25 ml   Filed Weights   03/25/15 0505 03/26/15 0535 03/27/15 0503  Weight: 84.687 kg (186 lb 11.2 oz) 84.1 kg (185 lb 6.5 oz) 88.451 kg (195 lb)    Exam:   General:  Awake, laying in bed in nad  Cardiovascular: regular, s1, s2  Respiratory:  normal resp effort, no wheezing, no crackles  Abdomen: soft, nondistended, extensive dressings in place over abdomen  Musculoskeletal: perfused, no clubbing   Data Reviewed: Basic Metabolic Panel:  Recent Labs Lab 03/24/15 1138 03/24/15 1811 03/25/15 0549 03/26/15 0650 03/27/15 0640  NA 136 137 138 139 141  K 5.3* 3.7 3.1* 3.3* 4.5  CL 105 102 105 108 114*  CO2 18* 23 21 21 19   GLUCOSE 86 76 64* 82 90  BUN 30* 26* 21 10 8   CREATININE 1.35* 1.27* 1.12* 0.84 0.80  CALCIUM 8.9 8.8 8.1* 8.0* 8.1*   Liver Function Tests:  Recent Labs Lab 03/24/15 1811 03/25/15 0549  AST 23 22  ALT 15 14  ALKPHOS 113 92  BILITOT 0.5 0.7  PROT 6.8 5.8*  ALBUMIN 2.6* 2.2*   No results for input(s): LIPASE, AMYLASE in the last 168 hours. No results for input(s): AMMONIA in the last 168 hours. CBC:  Recent Labs Lab 03/24/15 1811 03/25/15 0549 03/26/15 0650 03/27/15 0640  WBC 8.1 6.5 5.0 5.3  NEUTROABS 6.1  --   --   --   HGB 11.4* 9.3* 8.6* 9.0*  HCT 34.0* 27.5* 26.0* 27.2*  MCV 94.7 94.2 94.9 95.8  PLT 254 257 235 227   Cardiac Enzymes: No results for input(s): CKTOTAL, CKMB, CKMBINDEX, TROPONINI in the last 168 hours. BNP (last 3 results) No results for input(s): BNP in the last  8760 hours.  ProBNP (last 3 results) No results for input(s): PROBNP in the last 8760 hours.  CBG: No results for input(s): GLUCAP in the last 168 hours.  Recent Results (from the past 240 hour(s))  Wound culture     Status: None (Preliminary result)   Collection Time: 03/24/15  4:38 PM  Result Value Ref Range Status   Specimen Description WOUND ABDOMEN  Final   Special Requests NONE  Final   Gram Stain   Final    ABUNDANT WBC PRESENT,BOTH PMN AND MONONUCLEAR NO SQUAMOUS EPITHELIAL CELLS SEEN ABUNDANT GRAM POSITIVE COCCI IN PAIRS RARE GRAM NEGATIVE RODS Performed at [facility]    Culture   Final    Culture reincubated for better growth Performed at [facility]     Report Status PENDING  Incomplete  Culture, blood (routine x 2)     Status: None (Preliminary result)   Collection Time: 03/24/15 11:26 PM  Result Value Ref Range Status   Specimen Description BLOOD LEFT ARM  Final   Special Requests BOTTLES DRAWN AEROBIC AND ANAEROBIC 5CC EACH  Final   Culture   Final           BLOOD CULTURE RECEIVED NO GROWTH TO DATE CULTURE WILL BE HELD FOR 5 DAYS BEFORE ISSUING A FINAL NEGATIVE REPORT Performed at [facility]    Report Status PENDING  Incomplete  Culture, blood (routine x 2)     Status: None (Preliminary result)   Collection Time: 03/24/15 11:37 PM  Result Value Ref Range Status   Specimen Description BLOOD RIGHT ANTECUBITAL  Final   Special Requests BOTTLES DRAWN AEROBIC AND ANAEROBIC 5CC EACH  Final   Culture   Final           BLOOD CULTURE RECEIVED NO GROWTH TO DATE CULTURE WILL BE HELD FOR 5 DAYS BEFORE ISSUING A FINAL NEGATIVE REPORT Performed at [facility]    Report Status PENDING  Incomplete     Studies: No results found.  Scheduled Meds: . acidophilus  2 capsule Oral TID  . amiodarone  100 mg Oral Daily  . aspirin  81 mg Oral Daily  . cefTRIAXone (ROCEPHIN)  IV  1 g Intravenous Q24H  . docusate sodium  100 mg Oral Daily  . gabapentin  300 mg Oral QHS  . heparin  5,000 Units Subcutaneous 3 times per day  . pantoprazole  40 mg Oral QAC breakfast  . pravastatin  80 mg Oral q1800  . sertraline  50 mg Oral Daily  . vancomycin  1,500 mg Intravenous Q24H   Continuous Infusions: . sodium chloride 125 mL/hr at 03/27/15 0436    Principal Problem:   Abdominal wall abscess Active Problems:   Tachycardia-bradycardia syndrome   CAD (coronary artery disease)   Pacemaker   Hypotension   CHIU, STEPHEN K  [facility] Pager [phone]. If 7PM-7AM, please contact night-coverage at [email or web address], password [facility] 03/27/2015, 3:28 PM  LOS: 3 days

## 2015-03-28 LAB — BASIC METABOLIC PANEL
Anion gap: 9 (ref 5–15)
BUN: 6 mg/dL (ref 6–20)
CALCIUM: 8.1 mg/dL — AB (ref 8.9–10.3)
CO2: 20 mmol/L — AB (ref 22–32)
CREATININE: 0.78 mg/dL (ref 0.44–1.00)
Chloride: 111 mmol/L (ref 101–111)
GFR calc Af Amer: >60 mL/min (ref >60)
GFR calc non Af Amer: >60 mL/min (ref >60)
GLUCOSE: 82 mg/dL (ref 70–99)
Potassium: 3.6 mmol/L (ref 3.5–5.1)
Sodium: 140 mmol/L (ref 135–145)

## 2015-03-28 LAB — CBC
HCT: 29.9 % — ABNORMAL LOW (ref 36.0–46.0)
Hemoglobin: 9.6 g/dL — ABNORMAL LOW (ref 12.0–15.0)
MCH: 30.8 pg (ref 26.0–34.0)
MCHC: 32.1 g/dL (ref 30.0–36.0)
MCV: 95.8 fL (ref 78.0–100.0)
PLATELETS: 273 10*3/uL (ref 150–400)
RBC: 3.12 MIL/uL — AB (ref 3.87–5.11)
RDW: 13.4 % (ref 11.5–15.5)
WBC: 6.7 10*3/uL (ref 4.0–10.5)

## 2015-03-28 NOTE — Progress Notes (Signed)
ANTIBIOTIC CONSULT NOTE - FOLLOW UP  Pharmacy Consult for Rocephin / Vancomycin Indication: abdominal wall abscess  Allergies  Allergen Reactions  . Codeine Itching, Rash and Other (See Comments)    Full body rash   . Hydroxyzine Other (See Comments)    Extreme confusion and hallucinations  . Lorazepam Other (See Comments)    Extreme confusion, hallucinations and hyperactivity  . Penicillins Anaphylaxis, Hives and Shortness Of Breath    Tolerated cefazolin and ceftriaxone in 2013  . Sulfa Antibiotics Shortness Of Breath  . Zinc Itching  . Ciprofloxacin Other (See Comments)    unknown  . Ciprofloxacin Hives and Other (See Comments)    "think I break out in welts"   . Latex Rash and Other (See Comments)    Tears skin   . Penicillins Other (See Comments)    Unknown   . Sulfa Antibiotics Other (See Comments)    unknown   Labs:  Recent Labs  03/26/15 0650 03/27/15 0640 03/28/15 0725  WBC 5.0 5.3 6.7  HGB 8.6* 9.0* 9.6*  PLT 235 227 273  CREATININE 0.84 0.80 0.78   Estimated Creatinine Clearance: 60.3 mL/min (by C-G formula based on Cr of 0.78). No results for input(s): VANCOTROUGH, VANCOPEAK, VANCORANDOM, GENTTROUGH, GENTPEAK, GENTRANDOM, TOBRATROUGH, TOBRAPEAK, TOBRARND, AMIKACINPEAK, AMIKACINTROU, AMIKACIN in the last 72 hours.   Microbiology: Recent Results (from the past 720 hour(s))  Wound culture     Status: None (Preliminary result)   Collection Time: 03/24/15  4:38 PM  Result Value Ref Range Status   Specimen Description WOUND ABDOMEN  Final   Special Requests NONE  Final   Gram Stain   Final    ABUNDANT WBC PRESENT,BOTH PMN AND MONONUCLEAR NO SQUAMOUS EPITHELIAL CELLS SEEN ABUNDANT GRAM POSITIVE COCCI IN PAIRS RARE GRAM NEGATIVE RODS Performed at [facility]    Culture   Final    Culture reincubated for better growth Performed at [facility]    Report Status PENDING  Incomplete  Culture, blood (routine x 2)     Status: None  (Preliminary result)   Collection Time: 03/24/15 11:26 PM  Result Value Ref Range Status   Specimen Description BLOOD LEFT ARM  Final   Special Requests BOTTLES DRAWN AEROBIC AND ANAEROBIC 5CC EACH  Final   Culture   Final           BLOOD CULTURE RECEIVED NO GROWTH TO DATE CULTURE WILL BE HELD FOR 5 DAYS BEFORE ISSUING A FINAL NEGATIVE REPORT Performed at [facility]    Report Status PENDING  Incomplete  Culture, blood (routine x 2)     Status: None (Preliminary result)   Collection Time: 03/24/15 11:37 PM  Result Value Ref Range Status   Specimen Description BLOOD RIGHT ANTECUBITAL  Final   Special Requests BOTTLES DRAWN AEROBIC AND ANAEROBIC 5CC EACH  Final   Culture   Final           BLOOD CULTURE RECEIVED NO GROWTH TO DATE CULTURE WILL BE HELD FOR 5 DAYS BEFORE ISSUING A FINAL NEGATIVE REPORT Performed at [facility]    Report Status PENDING  Incomplete    Assessment: 81 yo lady currently on ceftriaxone and Vancomycin for cellulitis/abscess of abdominal wall. WBC wnl, afebrile.  Surgery / transfer pending for [facility] Scr stable, WBC stable, afebrile  Vanc 4/27 >> CTX 4/27 >>  4/27 Wound Cx >> pending 4/27 BCx2>> pending  Goal of Therapy:  eradication of infection  Plan:  -Continue Ceftriaxone  1 gm IV Q 24 hours  -Continue Vancomycin to 1500 mg IV Q 24 hours  -Monitor CBC, renal fx, cultures and clinical progress  Thank you Anette Guarneri, PharmD [phone]

## 2015-03-28 NOTE — Evaluation (Signed)
Occupational Therapy Evaluation Patient Details Name: Jill Bowman MRN: [id number] DOB: [birth year] Today's Date: 03/28/2015    History of Present Illness pt admitted with recurrent abdominal abcesses likely due to abdominal mess placement.   Clinical Impression   Pt appears to be a poor historian of PLOF, no family available to confirm pt's report.  Pt is performing ADL and ADL transfers with supervision.  Recommend home with supervision upon discharge. No further OT needs.    Follow Up Recommendations  No OT follow up;Supervision/Assistance - 24 hour    Equipment Recommendations  None recommended by OT    Recommendations for Other Services       Precautions / Restrictions Precautions Precautions: Fall Restrictions Weight Bearing Restrictions: No      Mobility Bed Mobility               General bed mobility comments: pt in chair  Transfers Overall transfer level: Needs assistance Equipment used: Rolling walker (2 wheeled) Transfers: Sit to/from Stand Sit to Stand: Supervision              Balance     Sitting balance-Leahy Scale: Good       Standing balance-Leahy Scale: Fair                              ADL Overall ADL's : Needs assistance/impaired Eating/Feeding: Independent;Sitting   Grooming: Wash/dry hands;Standing;Supervision/safety   Upper Body Bathing: Supervision/ safety;Sitting   Lower Body Bathing: Supervison/ safety;Sit to/from stand   Upper Body Dressing : Supervision/safety;Sitting   Lower Body Dressing: Supervision/safety;Sit to/from stand Lower Body Dressing Details (indicate cue type and reason): able to bend over and donn and doff socks Toilet Transfer: Supervision/safety;Ambulation;RW   Toileting- Clothing Manipulation and Hygiene: Supervision/safety;Sit to/from stand       Functional mobility during ADLs: Supervision/safety;Rolling walker       Vision     Perception     Praxis      Pertinent  Vitals/Pain Pain Assessment: No/denies pain     Hand Dominance Right   Extremity/Trunk Assessment Upper Extremity Assessment Upper Extremity Assessment: Overall WFL for tasks assessed   Lower Extremity Assessment Lower Extremity Assessment: Defer to PT evaluation   Cervical / Trunk Assessment Cervical / Trunk Assessment: Normal   Communication Communication Communication: No difficulties   Cognition Arousal/Alertness: Awake/alert Behavior During Therapy: WFL for tasks assessed/performed Overall Cognitive Status: No family/caregiver present to determine baseline cognitive functioning       Memory: Decreased short-term memory             General Comments       Exercises       Shoulder Instructions      Home Living Family/patient expects to be discharged to:: Private residence Living Arrangements: Children Available Help at Discharge: Family;Available 24 hours/day Type of Home: House Home Access: Stairs to enter [employer] of Steps: 3-4 Entrance Stairs-Rails: Right;Left Home Layout: One level     Bathroom Shower/Tub: Teacher, early years/pre: Handicapped height     Home Equipment: Environmental consultant - 2 wheels;Bedside commode;Shower seat          Prior Functioning/Environment Level of Independence: Independent with assistive device(s)        Comments: Per pt, she lives alone and does all IADL and ADL and uses a walker. Pt does not appear to be an accurate historian and no family available.    OT Diagnosis:  OT Problem List:     OT Treatment/Interventions:      OT Goals(Current goals can be found in the care plan section) Acute Rehab OT Goals Patient Stated Goal: get her stomach issue taken care of  OT Frequency:     Barriers to D/C:            Co-evaluation              End of Session Equipment Utilized During Treatment: Rolling walker;Gait belt  Activity Tolerance: Patient tolerated treatment well Patient left: in  chair;with call bell/phone within reach;with chair alarm set   Time: 2956-2130 OT Time Calculation (min): 17 min Charges:  OT General Charges $OT Visit: 1 Procedure OT Evaluation $Initial OT Evaluation Tier I: 1 Procedure G-Codes:    Malka So 03/28/2015, 12:14 PM  [phone]

## 2015-03-28 NOTE — Progress Notes (Signed)
Patient ID: Jill Bowman, female   DOB: [birth year], 81 y.o.   MRN: [id number] [facility] Progress Note:   * No surgery found *  Subjective: Mental status is clear. Objective: Vital signs in last 24 hours: Temp:  [98.3 F (36.8 C)-98.7 F (37.1 C)] 98.3 F (36.8 C) (05/01 0506) Pulse Rate:  [58-72] 65 (04/30 2310) Resp:  [16-18] 16 (05/01 0506) BP: (114-138)/(41-57) 126/57 mmHg (05/01 1107) SpO2:  [95 %-100 %] 99 % (05/01 0506) Weight:  [90.311 kg (199 lb 1.6 oz)] 90.311 kg (199 lb 1.6 oz) (05/01 0717)  Intake/Output from previous day: 04/30 0701 - 05/01 0700 In: 240 [P.O.:240] Out: -  Intake/Output this shift: Total I/O In: 120 [P.O.:120] Out: -   Physical Exam: Work of breathing is normal.  Wounds exposed and old purulence but no enteric contents noted.    Lab Results:  Results for orders placed or performed during the hospital encounter of 03/24/15 (from the past 48 hour(s))  Basic metabolic panel     Status: Abnormal   Collection Time: 03/27/15  6:40 AM  Result Value Ref Range   Sodium 141 135 - 145 mmol/L   Potassium 4.5 3.5 - 5.1 mmol/L   Chloride 114 (H) 96 - 112 mmol/L   CO2 19 19 - 32 mmol/L   Glucose, Bld 90 70 - 99 mg/dL   BUN 8 6 - 23 mg/dL   Creatinine, Ser 0.80 0.50 - 1.10 mg/dL   Calcium 8.1 (L) 8.4 - 10.5 mg/dL   GFR calc non Af Amer 66 (L) >90 mL/min   GFR calc Af Amer 77 (L) >90 mL/min    Comment: (NOTE) The eGFR has been calculated using the CKD EPI equation. This calculation has not been validated in all clinical situations. eGFR's persistently <90 mL/min signify possible Chronic Kidney Disease.    Anion gap 8 5 - 15  CBC     Status: Abnormal   Collection Time: 03/27/15  6:40 AM  Result Value Ref Range   WBC 5.3 4.0 - 10.5 K/uL   RBC 2.84 (L) 3.87 - 5.11 MIL/uL   Hemoglobin 9.0 (L) 12.0 - 15.0 g/dL   HCT 27.2 (L) 36.0 - 46.0 %   MCV 95.8 78.0 - 100.0 fL   MCH 31.7 26.0 - 34.0 pg   MCHC 33.1 30.0 - 36.0 g/dL   RDW 13.5 11.5  - 15.5 %   Platelets 227 150 - 400 K/uL  Basic metabolic panel     Status: Abnormal   Collection Time: 03/28/15  7:25 AM  Result Value Ref Range   Sodium 140 135 - 145 mmol/L   Potassium 3.6 3.5 - 5.1 mmol/L   Chloride 111 101 - 111 mmol/L   CO2 20 (L) 22 - 32 mmol/L   Glucose, Bld 82 70 - 99 mg/dL   BUN 6 6 - 20 mg/dL   Creatinine, Ser 0.78 0.44 - 1.00 mg/dL   Calcium 8.1 (L) 8.9 - 10.3 mg/dL   GFR calc non Af Amer >60 >60 mL/min   GFR calc Af Amer >60 >60 mL/min    Comment: (NOTE) The eGFR has been calculated using the CKD EPI equation. This calculation has not been validated in all clinical situations. eGFR's persistently <90 mL/min signify possible Chronic Kidney Disease.    Anion gap 9 5 - 15  CBC     Status: Abnormal   Collection Time: 03/28/15  7:25 AM  Result Value Ref Range   WBC 6.7 4.0 -  10.5 K/uL   RBC 3.12 (L) 3.87 - 5.11 MIL/uL   Hemoglobin 9.6 (L) 12.0 - 15.0 g/dL   HCT 29.9 (L) 36.0 - 46.0 %   MCV 95.8 78.0 - 100.0 fL   MCH 30.8 26.0 - 34.0 pg   MCHC 32.1 30.0 - 36.0 g/dL   RDW 13.4 11.5 - 15.5 %   Platelets 273 150 - 400 K/uL    Radiology/Results: No results found.  Anti-infectives: Anti-infectives    Start     Dose/Rate Route Frequency Ordered Stop   03/25/15 1800  vancomycin (VANCOCIN) IVPB 1000 mg/200 mL premix  Status:  Discontinued     1,000 mg 200 mL/hr over 60 Minutes Intravenous Every 24 hours 03/24/15 1815 03/25/15 1244   03/25/15 1800  vancomycin (VANCOCIN) 1,500 mg in sodium chloride 0.9 % 500 mL IVPB     1,500 mg 250 mL/hr over 120 Minutes Intravenous Every 24 hours 03/25/15 1244     03/25/15 1000  cefTRIAXone (ROCEPHIN) 1 g in dextrose 5 % 50 mL IVPB - Premix     1 g 100 mL/hr over 30 Minutes Intravenous Every 24 hours 03/24/15 2234     03/24/15 1745  vancomycin (VANCOCIN) IVPB 1000 mg/200 mL premix     1,000 mg 200 mL/hr over 60 Minutes Intravenous  Once 03/24/15 1732 03/24/15 2102   03/24/15 1730  cefTRIAXone (ROCEPHIN) 1 g in  dextrose 5 % 50 mL IVPB     1 g 100 mL/hr over 30 Minutes Intravenous  Once 03/24/15 1722 03/24/15 1852      Assessment/Plan: Problem List: Patient Active Problem List   Diagnosis Date Noted  . Cellulitis of abdominal wall 03/24/2015  . Hypotension 10/19/2014  . Abdominal wall abscess 10/19/2014  . Bradycardia 10/19/2014  . Fracture, nonunion Right tibia 05/02/2013  . Chest pain 12/26/2012  . Open fracture of lower end of right tibia, type IIIA, IIIB, or IIIC, with nonunion 09/30/2012  . Open disp comminuted fx of shaft of left tibia, type 3, with nonunion 09/30/2012  . Physical deconditioning 09/30/2012  . Soft tissue infection, L leg 09/11/2012  . Osteomyelitis, left leg 09/11/2012  . Hyperlipidemia 09/11/2012  . CAD (coronary artery disease) 07/16/2012  . Pacemaker 07/16/2012  . Morbid obesity 07/16/2012  . Acute respiratory failure with hypoxia 07/07/2012  . Open displaced pilon fracture of right tibia, type IIIA, IIIB, or IIIC 07/04/2012  . Fracture of tibial shaft, left, open 07/04/2012  . Periprosthetic fracture around internal prosthetic right knee joint 07/04/2012  . Periprosthetic fracture around internal prosthetic left knee joint 07/04/2012  . Multiple closed fractures of metatarsal bone, left foot 07/04/2012  . Degloving injury of lower leg, Left 07/04/2012  . Pacemaker   . Tachycardia-bradycardia syndrome   . Coronary artery disease     Awaiting to hear from [facility] about surgical transfer.   * No surgery found *    LOS: 4 days   Matt B. Hassell Done, MD, [facility] [phone] beeper [phone]  03/28/2015 12:35 PM

## 2015-03-28 NOTE — Plan of Care (Signed)
Problem: Phase I Progression Outcomes Goal: OOB as tolerated unless otherwise ordered Outcome: Completed/Met Date Met:  03/28/15 OOB to chair today

## 2015-03-28 NOTE — Progress Notes (Signed)
[facility] PROGRESS NOTE  Jill Bowman VOZ:[id number] DOB: February 12, 1929 DOA: 03/24/2015 PCP: Henrine Screws, MD  Assessment/Plan: 1. Abdominal wall abscess 1. General surgery consulted and following 2. Per surgery, pt would benefit from transfer to a tertiary care center. Transfer to [facility] is pending  3. Had recently consulted Cardiology for cardiac clearance and pt is cleared for surgery if indicated 4. Currently remains on rocephin and vanc 5. Blood cx neg x 2 and wound cx with mixed flora 2. HTN 1. BP remains stable 2. Cont current regimen 3. GERD 1. On PPI 2. On PRN GI cocktail 4. CAD 1. Remains stable thus far 2. Remains without chest pain 3. EKG unremarkable 5. Hx tachybrady syndrome 1. Pt is s/p pacemaker placement 2. Stable thus far 6. Mood disorder 1. On SSRI 2. Stable 7. DVT prophylaxis 1. Heparin subQ while inpatient  Code Status: Full Family Communication: Pt in room Disposition Plan: pending possible tx to tertiary care center   Consultants:  Cardiology  General Surgery  Procedures:    Antibiotics:  Vancomycin 4/27>>>  Rocephin 4/27>>>   HPI/Subjective: Pt denies chest pain. Reports abd is not hurting today.  Objective: Filed Vitals:   03/28/15 0506 03/28/15 0717 03/28/15 1107 03/28/15 1515  BP: 138/49  126/57 140/51  Pulse:    70  Temp: 98.3 F (36.8 C)   98.2 F (36.8 C)  TempSrc: Oral   Oral  Resp: 16   16  Height:      Weight:  90.311 kg (199 lb 1.6 oz)    SpO2: 99%   98%    Intake/Output Summary (Last 24 hours) at 03/28/15 1557 Last data filed at 03/28/15 0930  Gross per 24 hour  Intake    120 ml  Output      0 ml  Net    120 ml   Filed Weights   03/26/15 0535 03/27/15 0503 03/28/15 0717  Weight: 84.1 kg (185 lb 6.5 oz) 88.451 kg (195 lb) 90.311 kg (199 lb 1.6 oz)    Exam:   General:  Awake and alert, laying in bed, in nad  Cardiovascular: regular, s1, s2  Respiratory: normal resp effort, no  wheezing, no crackles  Abdomen: soft, nondistended, extensive dressings in place over abdomen  Musculoskeletal: perfused, no clubbing, no cyanosis  Data Reviewed: Basic Metabolic Panel:  Recent Labs Lab 03/24/15 1811 03/25/15 0549 03/26/15 0650 03/27/15 0640 03/28/15 0725  NA 137 138 139 141 140  K 3.7 3.1* 3.3* 4.5 3.6  CL 102 105 108 114* 111  CO2 23 21 21 19  20*  GLUCOSE 76 64* 82 90 82  BUN 26* 21 10 8 6   CREATININE 1.27* 1.12* 0.84 0.80 0.78  CALCIUM 8.8 8.1* 8.0* 8.1* 8.1*   Liver Function Tests:  Recent Labs Lab 03/24/15 1811 03/25/15 0549  AST 23 22  ALT 15 14  ALKPHOS 113 92  BILITOT 0.5 0.7  PROT 6.8 5.8*  ALBUMIN 2.6* 2.2*   No results for input(s): LIPASE, AMYLASE in the last 168 hours. No results for input(s): AMMONIA in the last 168 hours. CBC:  Recent Labs Lab 03/24/15 1811 03/25/15 0549 03/26/15 0650 03/27/15 0640 03/28/15 0725  WBC 8.1 6.5 5.0 5.3 6.7  NEUTROABS 6.1  --   --   --   --   HGB 11.4* 9.3* 8.6* 9.0* 9.6*  HCT 34.0* 27.5* 26.0* 27.2* 29.9*  MCV 94.7 94.2 94.9 95.8 95.8  PLT 254 257 235 227 273   Cardiac Enzymes: No  results for input(s): CKTOTAL, CKMB, CKMBINDEX, TROPONINI in the last 168 hours. BNP (last 3 results) No results for input(s): BNP in the last 8760 hours.  ProBNP (last 3 results) No results for input(s): PROBNP in the last 8760 hours.  CBG: No results for input(s): GLUCAP in the last 168 hours.  Recent Results (from the past 240 hour(s))  Wound culture     Status: None (Preliminary result)   Collection Time: 03/24/15  4:38 PM  Result Value Ref Range Status   Specimen Description WOUND ABDOMEN  Final   Special Requests NONE  Final   Gram Stain   Final    ABUNDANT WBC PRESENT,BOTH PMN AND MONONUCLEAR NO SQUAMOUS EPITHELIAL CELLS SEEN ABUNDANT GRAM POSITIVE COCCI IN PAIRS RARE GRAM NEGATIVE RODS Performed at [facility]    Culture   Final    Culture reincubated for better growth Performed  at [facility]    Report Status PENDING  Incomplete  Culture, blood (routine x 2)     Status: None (Preliminary result)   Collection Time: 03/24/15 11:26 PM  Result Value Ref Range Status   Specimen Description BLOOD LEFT ARM  Final   Special Requests BOTTLES DRAWN AEROBIC AND ANAEROBIC 5CC EACH  Final   Culture   Final           BLOOD CULTURE RECEIVED NO GROWTH TO DATE CULTURE WILL BE HELD FOR 5 DAYS BEFORE ISSUING A FINAL NEGATIVE REPORT Performed at [facility]    Report Status PENDING  Incomplete  Culture, blood (routine x 2)     Status: None (Preliminary result)   Collection Time: 03/24/15 11:37 PM  Result Value Ref Range Status   Specimen Description BLOOD RIGHT ANTECUBITAL  Final   Special Requests BOTTLES DRAWN AEROBIC AND ANAEROBIC 5CC EACH  Final   Culture   Final           BLOOD CULTURE RECEIVED NO GROWTH TO DATE CULTURE WILL BE HELD FOR 5 DAYS BEFORE ISSUING A FINAL NEGATIVE REPORT Performed at [facility]    Report Status PENDING  Incomplete     Studies: No results found.  Scheduled Meds: . acidophilus  2 capsule Oral TID  . amiodarone  100 mg Oral Daily  . aspirin  81 mg Oral Daily  . cefTRIAXone (ROCEPHIN)  IV  1 g Intravenous Q24H  . docusate sodium  100 mg Oral Daily  . gabapentin  300 mg Oral QHS  . heparin  5,000 Units Subcutaneous 3 times per day  . pantoprazole  40 mg Oral QAC breakfast  . pravastatin  80 mg Oral q1800  . sertraline  50 mg Oral Daily  . vancomycin  1,500 mg Intravenous Q24H   Continuous Infusions: . sodium chloride 125 mL/hr at 03/28/15 1106    Principal Problem:   Abdominal wall abscess Active Problems:   Tachycardia-bradycardia syndrome   CAD (coronary artery disease)   Pacemaker   Hypotension   CHIU, [place] [facility] Pager [phone]. If 7PM-7AM, please contact night-coverage at [email or web address], password [facility] 03/28/2015, 3:57 PM  LOS: 4 days

## 2015-03-29 LAB — BASIC METABOLIC PANEL
Anion gap: 6 (ref 5–15)
BUN: <5 mg/dL — ABNORMAL LOW (ref 6–20)
CHLORIDE: 114 mmol/L — AB (ref 101–111)
CO2: 23 mmol/L (ref 22–32)
CREATININE: 0.75 mg/dL (ref 0.44–1.00)
Calcium: 8.1 mg/dL — ABNORMAL LOW (ref 8.9–10.3)
GLUCOSE: 78 mg/dL (ref 70–99)
Potassium: 3.6 mmol/L (ref 3.5–5.1)
Sodium: 143 mmol/L (ref 135–145)

## 2015-03-29 LAB — CBC
HCT: 27.5 % — ABNORMAL LOW (ref 36.0–46.0)
Hemoglobin: 9 g/dL — ABNORMAL LOW (ref 12.0–15.0)
MCH: 31.4 pg (ref 26.0–34.0)
MCHC: 32.7 g/dL (ref 30.0–36.0)
MCV: 95.8 fL (ref 78.0–100.0)
Platelets: 263 10*3/uL (ref 150–400)
RBC: 2.87 MIL/uL — ABNORMAL LOW (ref 3.87–5.11)
RDW: 13.4 % (ref 11.5–15.5)
WBC: 5.8 10*3/uL (ref 4.0–10.5)

## 2015-03-29 NOTE — Progress Notes (Signed)
[facility] PROGRESS NOTE  [name] JOI:[id number] DOB: [birth year] DOA: 03/24/2015 PCP: Henrine Screws, MD  Assessment/Plan: 1. Abdominal wall abscess 1. General surgery following 2. Per surgery, pt would benefit from transfer to a tertiary care center. 3. Discussed with Surgery. [facility] recommended not to transfer from inpt to inpt, but instead will see as outpatient 4. Had recently consulted Cardiology for cardiac clearance and pt is cleared for surgery if indicated 5. Currently remains on rocephin and vanc 6. Blood cx neg x 2 and wound cx with mixed flora 2. HTN 1. BP remains stable 2. Cont current regimen 3. GERD 1. Remains on PPI 2. On PRN GI cocktail 4. CAD 1. Remains stable thus far 2. Remains without chest pain 3. EKG unremarkable 5. Hx tachybrady syndrome 1. Pt is s/p pacemaker placement 2. Stable thus far 6. Mood disorder 1. On SSRI 2. Stable 7. DVT prophylaxis 1. Heparin subQ while inpatient  Code Status: Full Family Communication: Pt in room Disposition Plan: possible d/c home 5/3   Consultants:  Cardiology  General Surgery  Procedures:    Antibiotics:  Vancomycin 4/27>>>  Rocephin 4/27>>>   HPI/Subjective: No complaints today. Denies abd pain  Objective: Filed Vitals:   03/28/15 2314 03/29/15 0635 03/29/15 1014 03/29/15 1356  BP: 139/96 123/39 124/45 129/59  Pulse:  71 69 73  Temp: 99 F (37.2 C) 98.7 F (37.1 C)  97.9 F (36.6 C)  TempSrc: Oral Oral  Oral  Resp: 14 18  16   Height:      Weight:  88.451 kg (195 lb)    SpO2: 99% 95%  99%    Intake/Output Summary (Last 24 hours) at 03/29/15 1756 Last data filed at 03/29/15 1447  Gross per 24 hour  Intake 4636.25 ml  Output    850 ml  Net 3786.25 ml   Filed Weights   03/27/15 0503 03/28/15 0717 03/29/15 0635  Weight: 88.451 kg (195 lb) 90.311 kg (199 lb 1.6 oz) 88.451 kg (195 lb)    Exam:   General:  Awake, laying in bed, in nad  Cardiovascular:  regular, s1, s2, regular rate  Respiratory: normal resp effort, no wheezing  Abdomen: soft, nondistended, dressings in place over abdomen  Musculoskeletal: perfused, no clubbing, no cyanosis  Data Reviewed: Basic Metabolic Panel:  Recent Labs Lab 03/25/15 0549 03/26/15 0650 03/27/15 0640 03/28/15 0725 03/29/15 0532  NA 138 139 141 140 143  K 3.1* 3.3* 4.5 3.6 3.6  CL 105 108 114* 111 114*  CO2 21 21 19  20* 23  GLUCOSE 64* 82 90 82 78  BUN 21 10 8 6  <5*  CREATININE 1.12* 0.84 0.80 0.78 0.75  CALCIUM 8.1* 8.0* 8.1* 8.1* 8.1*   Liver Function Tests:  Recent Labs Lab 03/24/15 1811 03/25/15 0549  AST 23 22  ALT 15 14  ALKPHOS 113 92  BILITOT 0.5 0.7  PROT 6.8 5.8*  ALBUMIN 2.6* 2.2*   No results for input(s): LIPASE, AMYLASE in the last 168 hours. No results for input(s): AMMONIA in the last 168 hours. CBC:  Recent Labs Lab 03/24/15 1811 03/25/15 0549 03/26/15 0650 03/27/15 0640 03/28/15 0725 03/29/15 0532  WBC 8.1 6.5 5.0 5.3 6.7 5.8  NEUTROABS 6.1  --   --   --   --   --   HGB 11.4* 9.3* 8.6* 9.0* 9.6* 9.0*  HCT 34.0* 27.5* 26.0* 27.2* 29.9* 27.5*  MCV 94.7 94.2 94.9 95.8 95.8 95.8  PLT 254 257 235 227 273 263  Cardiac Enzymes: No results for input(s): CKTOTAL, CKMB, CKMBINDEX, TROPONINI in the last 168 hours. BNP (last 3 results) No results for input(s): BNP in the last 8760 hours.  ProBNP (last 3 results) No results for input(s): PROBNP in the last 8760 hours.  CBG: No results for input(s): GLUCAP in the last 168 hours.  Recent Results (from the past 240 hour(s))  Wound culture     Status: None (Preliminary result)   Collection Time: 03/24/15  4:38 PM  Result Value Ref Range Status   Specimen Description WOUND ABDOMEN  Final   Special Requests NONE  Final   Gram Stain   Final    ABUNDANT WBC PRESENT,BOTH PMN AND MONONUCLEAR NO SQUAMOUS EPITHELIAL CELLS SEEN ABUNDANT GRAM POSITIVE COCCI IN PAIRS RARE GRAM NEGATIVE RODS Performed at  [facility]    Culture   Final    MODERATE STAPHYLOCOCCUS AUREUS Note: RIFAMPIN AND GENTAMICIN SHOULD NOT BE USED AS SINGLE DRUGS FOR TREATMENT OF STAPH INFECTIONS. Performed at [facility]    Report Status PENDING  Incomplete  Culture, blood (routine x 2)     Status: None (Preliminary result)   Collection Time: 03/24/15 11:26 PM  Result Value Ref Range Status   Specimen Description BLOOD LEFT ARM  Final   Special Requests BOTTLES DRAWN AEROBIC AND ANAEROBIC 5CC EACH  Final   Culture   Final           BLOOD CULTURE RECEIVED NO GROWTH TO DATE CULTURE WILL BE HELD FOR 5 DAYS BEFORE ISSUING A FINAL NEGATIVE REPORT Performed at [facility]    Report Status PENDING  Incomplete  Culture, blood (routine x 2)     Status: None (Preliminary result)   Collection Time: 03/24/15 11:37 PM  Result Value Ref Range Status   Specimen Description BLOOD RIGHT ANTECUBITAL  Final   Special Requests BOTTLES DRAWN AEROBIC AND ANAEROBIC 5CC EACH  Final   Culture   Final           BLOOD CULTURE RECEIVED NO GROWTH TO DATE CULTURE WILL BE HELD FOR 5 DAYS BEFORE ISSUING A FINAL NEGATIVE REPORT Performed at [facility]    Report Status PENDING  Incomplete     Studies: No results found.  Scheduled Meds: . acidophilus  2 capsule Oral TID  . amiodarone  100 mg Oral Daily  . aspirin  81 mg Oral Daily  . cefTRIAXone (ROCEPHIN)  IV  1 g Intravenous Q24H  . docusate sodium  100 mg Oral Daily  . gabapentin  300 mg Oral QHS  . heparin  5,000 Units Subcutaneous 3 times per day  . pantoprazole  40 mg Oral QAC breakfast  . pravastatin  80 mg Oral q1800  . sertraline  50 mg Oral Daily  . vancomycin  1,500 mg Intravenous Q24H   Continuous Infusions:    Principal Problem:   Abdominal wall abscess Active Problems:   Tachycardia-bradycardia syndrome   CAD (coronary artery disease)   Pacemaker   Hypotension   CHIU, STEPHEN K  [facility] Pager [phone]. If  7PM-7AM, please contact night-coverage at [email or web address], password  [facility] 03/29/2015, 5:56 PM  LOS: 5 days

## 2015-03-29 NOTE — Progress Notes (Signed)
Physical Therapy Treatment Patient Details Name: Jill Bowman MRN: [id number] DOB: Jan 04, 1929 Today's Date: 03/29/2015    History of Present Illness pt admitted with recurrent abdominal abcesses likely due to abdominal mess placement.    PT Comments    Pt progressing well with mobility, continuing to work on balance during ambulation and ability to change gait speeds. Pt ambulated 350' with RW and supervision. PT will continue to follow.   Follow Up Recommendations  Home health PT     Equipment Recommendations  None recommended by PT    Recommendations for Other Services       Precautions / Restrictions Precautions Precautions: Fall Restrictions Weight Bearing Restrictions: No    Mobility  Bed Mobility Overal bed mobility: Modified Independent             General bed mobility comments: pt from supine to SL to sit independently with use of rail  Transfers Overall transfer level: Needs assistance Equipment used: Rolling walker (2 wheeled) Transfers: Sit to/from Stand Sit to Stand: Supervision         General transfer comment: stood from bed and from toilet with supervision, heavy use of grab bar for standing from low toilet.   Ambulation/Gait Ambulation/Gait assistance: Supervision Ambulation Distance (Feet): 350 Feet Assistive device: Rolling walker (2 wheeled) Gait Pattern/deviations: Step-through pattern;Decreased stride length Gait velocity: appropriate for household ambulation Gait velocity interpretation:  (between 1.8 and 2.62 ft/ sec) General Gait Details: moderate speed, decreased ability to change speeds. Worked on turning around with RW as well as stop/ start   Financial trader [employer] (Stroke Patients Only)       Balance Overall balance assessment: Needs assistance Sitting-balance support: No upper extremity supported;Feet supported Sitting balance-Leahy Scale: Good     Standing balance  support: No upper extremity supported;During functional activity Standing balance-Leahy Scale: Good Standing balance comment: pt can reach out of BOS in standing but limitations evident, mvmt guarded, decreased wt-shift, prefers UE support                    Cognition Arousal/Alertness: Awake/alert Behavior During Therapy: WFL for tasks assessed/performed Overall Cognitive Status: No family/caregiver present to determine baseline cognitive functioning Area of Impairment: Problem solving     Memory: Decreased short-term memory       Problem Solving: Slow processing General Comments: pt thinks that she just got to hospital this morning.     Exercises      General Comments        Pertinent Vitals/Pain Pain Assessment: No/denies pain    Home Living                      Prior Function            PT Goals (current goals can now be found in the care plan section) Acute Rehab PT Goals Patient Stated Goal: get her stomach issue taken care of PT Goal Formulation: With patient Time For Goal Achievement: 04/09/15 Potential to Achieve Goals: Good Progress towards PT goals: Progressing toward goals    Frequency  Min 3X/week    PT Plan Current plan remains appropriate    Co-evaluation             End of Session Equipment Utilized During Treatment: Gait belt Activity Tolerance: Patient tolerated treatment well Patient left: in chair;with call bell/phone within reach;with chair alarm set  Time: 4103-0131 PT Time Calculation (min) (ACUTE ONLY): 16 min  Charges:  $Gait Training: 8-22 mins                    G Codes:     Leighton Roach, PT  Acute Rehab Services  [phone]  Leighton Roach 03/29/2015, 1:37 PM

## 2015-03-29 NOTE — Progress Notes (Signed)
[facility] Progress Note    Subjective: Very pleasant.  Awaiting decision about transfer to [facility] for high level of care for chronically infected mesh.  Objective: Vital signs in last 24 hours: Temp:  [98.2 F (36.8 C)-99 F (37.2 C)] 98.7 F (37.1 C) (05/02 0635) Pulse Rate:  [69-71] 69 (05/02 1014) Resp:  [14-18] 18 (05/02 0635) BP: (123-140)/(39-96) 124/45 mmHg (05/02 1014) SpO2:  [95 %-99 %] 95 % (05/02 0635) Weight:  [88.451 kg (195 lb)] 88.451 kg (195 lb) (05/02 0635) Last BM Date: 03/26/15  Intake/Output from previous day: 05/01 0701 - 05/02 0700 In: 3670 [P.O.:120; I.V.:3000; IV Piggyback:550] Out: 700 [Urine:700] Intake/Output this shift: Total I/O In: 406.3 [I.V.:406.3] Out: -   General: No distress  Lungs: Clear  Abd: Benign, but has draining fistulae  Extremities: No changes  Neuro: Intact  Lab Results:  @LABLAST2 (wbc:2,hgb:2,hct:2,plt:2) BMET  Recent Labs  03/28/15 0725 03/29/15 0532  NA 140 143  K 3.6 3.6  CL 111 114*  CO2 20* 23  GLUCOSE 82 78  BUN 6 <5*  CREATININE 0.78 0.75  CALCIUM 8.1* 8.1*   PT/INR No results for input(s): LABPROT, INR in the last 72 hours. ABG No results for input(s): PHART, HCO3 in the last 72 hours.  Invalid input(s): PCO2, PO2  Studies/Results: No results found.  Anti-infectives: Anti-infectives    Start     Dose/Rate Route Frequency Ordered Stop   03/25/15 1800  vancomycin (VANCOCIN) IVPB 1000 mg/200 mL premix  Status:  Discontinued     1,000 mg 200 mL/hr over 60 Minutes Intravenous Every 24 hours 03/24/15 1815 03/25/15 1244   03/25/15 1800  vancomycin (VANCOCIN) 1,500 mg in sodium chloride 0.9 % 500 mL IVPB     1,500 mg 250 mL/hr over 120 Minutes Intravenous Every 24 hours 03/25/15 1244     03/25/15 1000  cefTRIAXone (ROCEPHIN) 1 g in dextrose 5 % 50 mL IVPB - Premix     1 g 100 mL/hr over 30 Minutes Intravenous Every 24 hours 03/24/15 2234     03/24/15 1745  vancomycin (VANCOCIN) IVPB 1000 mg/200  mL premix     1,000 mg 200 mL/hr over 60 Minutes Intravenous  Once 03/24/15 1732 03/24/15 2102   03/24/15 1730  cefTRIAXone (ROCEPHIN) 1 g in dextrose 5 % 50 mL IVPB     1 g 100 mL/hr over 30 Minutes Intravenous  Once 03/24/15 1722 03/24/15 1852      Assessment/Plan: s/p  Awaiting some decision from [facility] about transfer  Procedure to control this problem will be very extended and cause significant morbidity potentially.  LOS: 5 days   Kathryne Eriksson. Dahlia Bailiff, MD, FACS [phone] [facility] 03/29/2015

## 2015-03-30 LAB — WOUND CULTURE

## 2015-03-30 MED ORDER — POLYETHYLENE GLYCOL 3350 17 G PO PACK: 17.0000 g | PACK | Freq: Every day | ORAL | Status: DC

## 2015-03-30 MED ORDER — SACCHAROMYCES BOULARDII 250 MG PO CAPS: 250.0000 mg | ORAL_CAPSULE | Freq: Two times a day (BID) | ORAL | Status: DC

## 2015-03-30 MED ORDER — CLINDAMYCIN HCL 300 MG PO CAPS: 300.0000 mg | ORAL_CAPSULE | Freq: Three times a day (TID) | ORAL | Status: DC

## 2015-03-30 NOTE — Progress Notes (Signed)
Patient was discharged home with home health by MD order; discharged instructions  review and give to patient and her daughter with care notes and prescriptions; IV DIC; skin - dressing on abdomen intact, clean, dry); patient will be escorted to the car by nurse tech via wheelchair.

## 2015-03-30 NOTE — Progress Notes (Signed)
Patient ID: Jill Bowman, female   DOB: 06/28/1929, 81 y.o.   MRN: [id number]     [facility]      [address]   [place], [place] [address]    Phone: [phone] FAX: [phone]     Subjective: No changes.  VSS.  Afebrile.    Objective:  Vital signs:  Filed Vitals:   03/29/15 1356 03/29/15 2205 03/30/15 0720 03/30/15 0721  BP: 129/59 122/33  128/47  Pulse: 73     Temp: 97.9 F (36.6 C) 99 F (37.2 C)  98.2 F (36.8 C)  TempSrc: Oral Oral  Oral  Resp: 16 20  13  Height:      Weight:   89.767 kg (197 lb 14.4 oz)   SpO2: 99% 98%  98%    Last BM Date: 03/28/15  Intake/Output   Yesterday:  05/02 0701 - 05/03 0700 In: 1086.3 [P.O.:680; I.V.:406.3] Out: 150 [Urine:150] This shift:  Total I/O In: 60 [P.O.:60] Out: 300 [Urine:300]    Physical Exam: General: Pt awake/alert/oriented x4 in no acute distress Abdomen: Soft.  Nondistended.  2 sinus tracts purulent drainage.   No evidence of peritonitis.  No incarcerated hernias. a   Problem List:   Principal Problem:   Abdominal wall abscess Active Problems:   Tachycardia-bradycardia syndrome   CAD (coronary artery disease)   Pacemaker   Hypotension    Results:   Labs: Results for orders placed or performed during the hospital encounter of 03/24/15 (from the past 48 hour(s))  CBC     Status: Abnormal   Collection Time: 03/29/15  5:32 AM  Result Value Ref Range   WBC 5.8 4.0 - 10.5 K/uL   RBC 2.87 (L) 3.87 - 5.11 MIL/uL   Hemoglobin 9.0 (L) 12.0 - 15.0 g/dL   HCT 27.5 (L) 36.0 - 46.0 %   MCV 95.8 78.0 - 100.0 fL   MCH 31.4 26.0 - 34.0 pg   MCHC 32.7 30.0 - 36.0 g/dL   RDW 13.4 11.5 - 15.5 %   Platelets 263 150 - 400 K/uL  Basic metabolic panel     Status: Abnormal   Collection Time: 03/29/15  5:32 AM  Result Value Ref Range   Sodium 143 135 - 145 mmol/L   Potassium 3.6 3.5 - 5.1 mmol/L   Chloride 114 (H) 101 - 111 mmol/L   CO2 23 22 - 32 mmol/L   Glucose, Bld 78 70 - 99 mg/dL   BUN <5 (L) 6 - 20 mg/dL   Creatinine, Ser 0.75 0.44 - 1.00 mg/dL   Calcium 8.1 (L) 8.9 - 10.3 mg/dL   GFR calc non Af Amer >60 >60 mL/min   GFR calc Af Amer >60 >60 mL/min    Comment: (NOTE) The eGFR has been calculated using the CKD EPI equation. This calculation has not been validated in all clinical situations. eGFR's persistently <90 mL/min signify possible Chronic Kidney Disease.    Anion gap 6 5 - 15    Imaging / Studies: No results found.  Medications / Allergies:  Scheduled Meds: . acidophilus  2 capsule Oral TID  . amiodarone  100 mg Oral Daily  . aspirin  81 mg Oral Daily  . cefTRIAXone (ROCEPHIN)  IV  1 g Intravenous Q24H  . docusate sodium  100 mg Oral Daily  . gabapentin  300 mg Oral QHS  . heparin  5,000 Units Subcutaneous 3 times per day  . pantoprazole  40 mg Oral QAC   breakfast  . pravastatin  80 mg Oral q1800  . sertraline  50 mg Oral Daily  . vancomycin  1,500 mg Intravenous Q24H   Continuous Infusions:  PRN Meds:.acetaminophen **OR** acetaminophen, gi cocktail, ibuprofen, ondansetron **OR** ondansetron (ZOFRAN) IV, zolpidem  Antibiotics: Anti-infectives    Start     Dose/Rate Route Frequency Ordered Stop   03/25/15 1800  vancomycin (VANCOCIN) IVPB 1000 mg/200 mL premix  Status:  Discontinued     1,000 mg 200 mL/hr over 60 Minutes Intravenous Every 24 hours 03/24/15 1815 03/25/15 1244   03/25/15 1800  vancomycin (VANCOCIN) 1,500 mg in sodium chloride 0.9 % 500 mL IVPB     1,500 mg 250 mL/hr over 120 Minutes Intravenous Every 24 hours 03/25/15 1244     03/25/15 1000  cefTRIAXone (ROCEPHIN) 1 g in dextrose 5 % 50 mL IVPB - Premix     1 g 100 mL/hr over 30 Minutes Intravenous Every 24 hours 03/24/15 2234     03/24/15 1745  vancomycin (VANCOCIN) IVPB 1000 mg/200 mL premix     1,000 mg 200 mL/hr over 60 Minutes Intravenous  Once 03/24/15 1732 03/24/15 2102   03/24/15 1730  cefTRIAXone (ROCEPHIN) 1 g in dextrose 5 % 50 mL  IVPB     1 g 100 mL/hr over 30 Minutes Intravenous  Once 03/24/15 1722 03/24/15 1852        Assessment/Plan Chronic mesh infection with draining sinuses-the patient will be seen at [facility] as an OP.  They will call her today to schedule a follow up.  BID wet to dry dressing changes. Wound culture yielded MRSA, on Vanc/rocephin, may change to clindamycin or bactrim.  Stable for discharge from surgical standpoint.     , ANP-BC [facility] Pager [phone](7A-4:30P) For consults and floor pages call [phone](7A-4:30P)  03/30/2015 8:44 AM    

## 2015-03-30 NOTE — Discharge Summary (Signed)
Physician Discharge Summary  Trista Ciocca ZHY:[id number] DOB: 1929-08-16 DOA: 03/23/78 2016  PCP: Henrine Screws, MD  Admit date: 03/24/2015 Discharge date: 03/30/2015  Time spent: 20 minutes  Recommendations for Outpatient Follow-up:  1. Follow up with PCP in 1-2 weeks 2. Follow up with [facility] - to be scheduled  Discharge Diagnoses:  Principal Problem:   Abdominal wall abscess Active Problems:   Tachycardia-bradycardia syndrome   CAD (coronary artery disease)   Pacemaker   Hypotension   Discharge Condition: Stable  Diet recommendation: Heart healthy  Filed Weights   03/28/15 0717 03/29/15 0635 03/30/15 0720  Weight: 90.311 kg (199 lb 1.6 oz) 88.451 kg (195 lb) 89.767 kg (197 lb 14.4 oz)    History of present illness:  Please see admit h and p from 4/27 for details. Briefly, pt presented with abd abscess. She was admitted for further work up.  Hospital Course:  1. Abdominal wall abscess 1. General surgery was consulted and was following 2. Per surgery, pt would benefit from transfer to a tertiary care center. 3. Discussed with Surgery. [facility] recommended not to transfer directly as inpt, but instead will see as outpatient 4. During this course, had consulted Cardiology for cardiac clearance and pt is cleared for surgery if indicated 5. Pt had been continued on rocephin and vanc, to continue on clindamycin on discharge, per General Surgery 6. Blood cx neg x 2 and wound cx with mixed flora 2. HTN 1. BP remained stable 2. Cont current regimen 3. GERD 1. Remains on PPI 2. On PRN GI cocktail 4. CAD 1. Remains stable thus far 2. Remains without chest pain 3. EKG unremarkable 5. Hx tachybrady syndrome 1. Pt is s/p pacemaker placement 2. Stable thus far 6. Mood disorder 1. On SSRI 2. Stable 7. DVT prophylaxis 1. Heparin subQ while inpatient  Consultations:  General Surgery  Cardiology  Discharge Exam: Filed Vitals:   03/29/15 1356 03/29/15 2205  03/30/15 0720 03/30/15 0721  BP: 129/59 122/33  128/47  Pulse: 73     Temp: 97.9 F (36.6 C) 99 F (37.2 C)  98.2 F (36.8 C)  TempSrc: Oral Oral  Oral  Resp: 16 20  13   Height:      Weight:   89.767 kg (197 lb 14.4 oz)   SpO2: 99% 98%  98%    General: Awake, in nad Cardiovascular: regular, s1, s2 Respiratory: normal resp effort, no wheezing  Discharge Instructions     Medication List    STOP taking these medications        cephALEXin 500 MG capsule  Commonly known as:  KEFLEX     oxyCODONE 5 MG immediate release tablet  Commonly known as:  Oxy IR/ROXICODONE     Sennosides 8.6 MG Caps     traMADol 50 MG tablet  Commonly known as:  ULTRAM      TAKE these medications        acetaminophen 500 MG tablet  Commonly known as:  TYLENOL  Take 500 mg by mouth every 4 (four) hours as needed for pain.     acidophilus Caps capsule  Take 1 capsule by mouth daily.     Acidophilus Tabs  Take 1 tablet by mouth daily.     amiodarone 200 MG tablet  Commonly known as:  PACERONE  Take 100 mg by mouth daily.     aspirin 81 MG tablet  Take 81 mg by mouth daily.     CALCIUM 600+D3 600-800 MG-UNIT Tabs  Generic drug:  Calcium Carb-Cholecalciferol  Take 1 tablet by mouth daily.     clindamycin 300 MG capsule  Commonly known as:  CLEOCIN  Take 1 capsule (300 mg total) by mouth 3 (three) times daily.     [place]  1 Package by Does not apply route daily.     DSS 100 MG Caps  Take 100 mg by mouth 2 (two) times daily.     gabapentin 600 MG tablet  Commonly known as:  NEURONTIN  Take 300 mg by mouth at bedtime.     ibuprofen 800 MG tablet  Commonly known as:  ADVIL,MOTRIN  Take 1,600 mg by mouth every 8 (eight) hours as needed for moderate pain.     iodine-sodium iodide 2-2.4 % solution  Apply topically once. Clean are well with this and cotton swab prior to packing area     l-methylfolate-B6-B12 3-35-2 MG Tabs  Commonly known as:  METANX   Take 2 tablets by mouth daily.     loratadine 10 MG tablet  Commonly known as:  CLARITIN  Take 10 mg by mouth at bedtime.     LORazepam 1 MG tablet  Commonly known as:  ATIVAN  Take 0.5 tablets (0.5 mg total) by mouth every 8 (eight) hours as needed for anxiety.     metoprolol succinate 25 MG 24 hr tablet  Commonly known as:  TOPROL-XL  Take 0.5 tablets (12.5 mg total) by mouth daily.     multivitamin with minerals Tabs tablet  Take 1 tablet by mouth daily.     nitroGLYCERIN 0.4 MG SL tablet  Commonly known as:  NITROSTAT  Place 0.4 mg under the tongue every 5 (five) minutes as needed for chest pain.     pantoprazole 40 MG tablet  Commonly known as:  PROTONIX  Take 1 tablet (40 mg total) by mouth daily at 12 noon.     polyethylene glycol packet  Commonly known as:  MIRALAX  Take 17 g by mouth daily.     pravastatin 80 MG tablet  Commonly known as:  PRAVACHOL  Take 80 mg by mouth every morning.     saccharomyces boulardii 250 MG capsule  Commonly known as:  FLORASTOR  Take 1 capsule (250 mg total) by mouth 2 (two) times daily.     sertraline 50 MG tablet  Commonly known as:  ZOLOFT  Take 50 mg by mouth daily.     vitamin C 500 MG tablet  Commonly known as:  ASCORBIC ACID  Take 500 mg by mouth daily at 12 noon.     Vitamin D (Cholecalciferol) 1000 UNITS Caps  Take 2 capsules by mouth daily.       Allergies  Allergen Reactions  . Codeine Itching, Rash and Other (See Comments)    Full body rash   . Hydroxyzine Other (See Comments)    Extreme confusion and hallucinations  . Lorazepam Other (See Comments)    Extreme confusion, hallucinations and hyperactivity  . Penicillins Anaphylaxis, Hives and Shortness Of Breath    Tolerated cefazolin and ceftriaxone in 2013  . Sulfa Antibiotics Shortness Of Breath  . Zinc Itching  . Ciprofloxacin Other (See Comments)    unknown  . Ciprofloxacin Hives and Other (See Comments)    "think I break out in welts"   . Latex  Rash and Other (See Comments)    Tears skin   . Penicillins Other (See Comments)    Unknown   . Sulfa Antibiotics Other (See  Comments)    unknown   Follow-up Information    Follow up with GATES,ROBERT NEVILL, MD. Schedule an appointment as soon as possible for a visit in 1 week.   Specialty:  Internal Medicine   Why:  Hospital follow up   Contact information:   [address] [place] [address] [phone]       Follow up with Follow up at [facility].   Why:  you will be contacted to schedule an appointment       The results of significant diagnostics from this hospitalization (including imaging, microbiology, ancillary and laboratory) are listed below for reference.    Significant Diagnostic Studies: Dg Chest 2 View  03/24/2015   CLINICAL DATA:  Chest pain  EXAM: CHEST  2 VIEW  COMPARISON:  October 19, 2014  FINDINGS: There is no edema or consolidation. Heart size and pulmonary vascularity are normal. Pacemaker leads are attached to the right atrium and right ventricle. No adenopathy. There is degenerative change in the thoracic spine. There is atherosclerotic change in the aortic arch region.  IMPRESSION: No edema or consolidation.   Electronically Signed   By: Lowella Grip III M.D.   On: 03/24/2015 13:30   Ct Abdomen Pelvis W Contrast  03/09/2015   CLINICAL DATA:  81 year old female with abdominal pain. Prior hernia repair. Recurrent hernia symptoms November 2015. Prior appendectomy, cholecystectomy, abdominal hysterectomy and incision and drainage 10/20/2014. Initial encounter.  EXAM: CT ABDOMEN AND PELVIS WITH CONTRAST  TECHNIQUE: Multidetector CT imaging of the abdomen and pelvis was performed using the standard protocol following bolus administration of intravenous contrast.  CONTRAST:  100 cc Isovue 300.  COMPARISON:  10/19/2014.  FINDINGS: Left paracentral periumbilical 5.3 x 5.5 cm hernia contains portion of transverse colon and small bowel but does not  appear to be causing obstruction.  Previously noted anterior abdominal and pelvic wall abscess has been surgically drained with portions of anterior abdominal wall (right paracentral position) and anterior pelvic wall (centrally) healing by secondary intent.  There is a low-density collection to the right of the bowel containing hernia measuring 1.6 x 1.8 cm (maximal transverse dimension) which extends inferiorly to the left rectus muscle which may represent residua of abscess/scar tissue.  Incidentally noted are slightly prominent vessels within portions of the lower pelvic subcutaneous region.  Scattered colonic diverticula muscular hypertrophy most notable sigmoid colon without findings to suggest acute inflammation. No free intraperitoneal air.  Moderate size hiatal hernia.  Scattered small to normal/top-normal size retroperitoneal and mesenteric lymph nodes.  Slightly enlarged liver without focal worrisome hepatic, splenic, pancreatic, renal or adrenal lesion. Post cholecystectomy.  Post hysterectomy. Noncontrast filled views of the urinary bladder unremarkable.  Degenerative changes lower thoracic and lumbar spine with scoliosis. Prior bilateral hip surgery.  Atherosclerotic type changes abdominal aorta with ectasia without aneurysmal dilation. Narrowing of origin of the superior mesenteric artery, celiac artery, renal arteries and portions of the iliac arteries.  Pacemaker is in place.  Heart size top-normal.  Lung bases clear.  IMPRESSION: Left paracentral periumbilical 5.3 x 5.5 cm hernia contains portion of transverse colon and small bowel but does not appear to be causing obstruction.  Previously noted anterior abdominal and pelvic wall abscess has been surgically drained with portions of anterior abdominal wall (right paracentral position) and anterior pelvic wall (centrally) healing by secondary intent.  There is a low-density collection to the right of the bowel containing hernia measuring 1.6 x 1.8 cm  (maximal transverse dimension) which extends  inferiorly to the left rectus muscle which may represent residua of abscess/scar tissue.  Incidentally noted are slightly prominent vessels within portions of the lower pelvic subcutaneous region.  Scattered colonic diverticula muscular hypertrophy most notable sigmoid colon without findings to suggest acute inflammation. No free intraperitoneal air.  Moderate size hiatal hernia   Electronically Signed   By: Genia Del M.D.   On: 03/09/2015 08:35    Microbiology: Recent Results (from the past 240 hour(s))  Wound culture     Status: None   Collection Time: 03/24/15  4:38 PM  Result Value Ref Range Status   Specimen Description WOUND ABDOMEN  Final   Special Requests NONE  Final   Gram Stain   Final    ABUNDANT WBC PRESENT,BOTH PMN AND MONONUCLEAR NO SQUAMOUS EPITHELIAL CELLS SEEN ABUNDANT GRAM POSITIVE COCCI IN PAIRS RARE GRAM NEGATIVE RODS Performed at [facility]    Culture   Final    MODERATE METHICILLIN RESISTANT STAPHYLOCOCCUS AUREUS Note: RIFAMPIN AND GENTAMICIN SHOULD NOT BE USED AS SINGLE DRUGS FOR TREATMENT OF STAPH INFECTIONS. This organism DOES NOT demonstrate inducible Clindamycin resistance in vitro. CRITICAL RESULT CALLED TO, READ BACK BY AND VERIFIED WITH: GRETA S. @ 0815  ON 03/30/15 BY KENDR Performed at [facility]    Report Status 03/30/2015 FINAL  Final   Organism ID, Bacteria METHICILLIN RESISTANT STAPHYLOCOCCUS AUREUS  Final      Susceptibility   Methicillin resistant staphylococcus aureus - MIC*    CLINDAMYCIN <=0.25 SENSITIVE Sensitive     ERYTHROMYCIN >=8 RESISTANT Resistant     GENTAMICIN <=0.5 SENSITIVE Sensitive     LEVOFLOXACIN >=8 RESISTANT Resistant     OXACILLIN >=4 RESISTANT Resistant     PENICILLIN >=0.5 RESISTANT Resistant     RIFAMPIN <=0.5 SENSITIVE Sensitive     TRIMETH/SULFA <=10 SENSITIVE Sensitive     VANCOMYCIN <=0.5 SENSITIVE Sensitive     TETRACYCLINE <=1 SENSITIVE Sensitive      * MODERATE METHICILLIN RESISTANT STAPHYLOCOCCUS AUREUS  Culture, blood (routine x 2)     Status: None (Preliminary result)   Collection Time: 03/24/15 11:26 PM  Result Value Ref Range Status   Specimen Description BLOOD LEFT ARM  Final   Special Requests BOTTLES DRAWN AEROBIC AND ANAEROBIC 5CC EACH  Final   Culture   Final           BLOOD CULTURE RECEIVED NO GROWTH TO DATE CULTURE WILL BE HELD FOR 5 DAYS BEFORE ISSUING A FINAL NEGATIVE REPORT Performed at [facility]    Report Status PENDING  Incomplete  Culture, blood (routine x 2)     Status: None (Preliminary result)   Collection Time: 03/24/15 11:37 PM  Result Value Ref Range Status   Specimen Description BLOOD RIGHT ANTECUBITAL  Final   Special Requests BOTTLES DRAWN AEROBIC AND ANAEROBIC 5CC EACH  Final   Culture   Final           BLOOD CULTURE RECEIVED NO GROWTH TO DATE CULTURE WILL BE HELD FOR 5 DAYS BEFORE ISSUING A FINAL NEGATIVE REPORT Performed at [facility]    Report Status PENDING  Incomplete     Labs: Basic Metabolic Panel:  Recent Labs Lab 03/25/15 0549 03/26/15 0650 03/27/15 0640 03/28/15 0725 03/29/15 0532  NA 138 139 141 140 143  K 3.1* 3.3* 4.5 3.6 3.6  CL 105 108 114* 111 114*  CO2 21 21 19  20* 23  GLUCOSE 64* 82 90 82 78  BUN 21 10  8 6 <5*  CREATININE 1.12* 0.84 0.80 0.78 0.75  CALCIUM 8.1* 8.0* 8.1* 8.1* 8.1*   Liver Function Tests:  Recent Labs Lab 03/24/15 1811 03/25/15 0549  AST 23 22  ALT 15 14  ALKPHOS 113 92  BILITOT 0.5 0.7  PROT 6.8 5.8*  ALBUMIN 2.6* 2.2*   No results for input(s): LIPASE, AMYLASE in the last 168 hours. No results for input(s): AMMONIA in the last 168 hours. CBC:  Recent Labs Lab 03/24/15 1811 03/25/15 0549 03/26/15 0650 03/27/15 0640 03/28/15 0725 03/29/15 0532  WBC 8.1 6.5 5.0 5.3 6.7 5.8  NEUTROABS 6.1  --   --   --   --   --   HGB 11.4* 9.3* 8.6* 9.0* 9.6* 9.0*  HCT 34.0* 27.5* 26.0* 27.2* 29.9* 27.5*  MCV 94.7 94.2 94.9  95.8 95.8 95.8  PLT 254 257 235 227 273 263   Cardiac Enzymes: No results for input(s): CKTOTAL, CKMB, CKMBINDEX, TROPONINI in the last 168 hours. BNP: BNP (last 3 results) No results for input(s): BNP in the last 8760 hours.  ProBNP (last 3 results) No results for input(s): PROBNP in the last 8760 hours.  CBG: No results for input(s): GLUCAP in the last 168 hours.   Signed:  Dawnetta Copenhaver K  [facility] 03/30/2015, 11:12 AM

## 2015-03-31 DIAGNOSIS — E785 Hyperlipidemia, unspecified: Secondary | ICD-10-CM | POA: Diagnosis not present

## 2015-03-31 DIAGNOSIS — L02211 Cutaneous abscess of abdominal wall: Secondary | ICD-10-CM | POA: Diagnosis not present

## 2015-03-31 DIAGNOSIS — J45909 Unspecified asthma, uncomplicated: Secondary | ICD-10-CM | POA: Diagnosis not present

## 2015-03-31 DIAGNOSIS — I10 Essential (primary) hypertension: Secondary | ICD-10-CM | POA: Diagnosis not present

## 2015-03-31 DIAGNOSIS — I2510 Atherosclerotic heart disease of native coronary artery without angina pectoris: Secondary | ICD-10-CM | POA: Diagnosis not present

## 2015-03-31 LAB — CULTURE, BLOOD (ROUTINE X 2)
CULTURE: NO GROWTH
Culture: NO GROWTH

## 2015-04-02 DIAGNOSIS — L02211 Cutaneous abscess of abdominal wall: Secondary | ICD-10-CM | POA: Diagnosis not present

## 2015-04-02 DIAGNOSIS — E785 Hyperlipidemia, unspecified: Secondary | ICD-10-CM | POA: Diagnosis not present

## 2015-04-02 DIAGNOSIS — J45909 Unspecified asthma, uncomplicated: Secondary | ICD-10-CM | POA: Diagnosis not present

## 2015-04-02 DIAGNOSIS — I10 Essential (primary) hypertension: Secondary | ICD-10-CM | POA: Diagnosis not present

## 2015-04-02 DIAGNOSIS — I2510 Atherosclerotic heart disease of native coronary artery without angina pectoris: Secondary | ICD-10-CM | POA: Diagnosis not present

## 2015-04-06 DIAGNOSIS — J45909 Unspecified asthma, uncomplicated: Secondary | ICD-10-CM | POA: Diagnosis not present

## 2015-04-06 DIAGNOSIS — E785 Hyperlipidemia, unspecified: Secondary | ICD-10-CM | POA: Diagnosis not present

## 2015-04-06 DIAGNOSIS — I2510 Atherosclerotic heart disease of native coronary artery without angina pectoris: Secondary | ICD-10-CM | POA: Diagnosis not present

## 2015-04-06 DIAGNOSIS — L02211 Cutaneous abscess of abdominal wall: Secondary | ICD-10-CM | POA: Diagnosis not present

## 2015-04-06 DIAGNOSIS — I10 Essential (primary) hypertension: Secondary | ICD-10-CM | POA: Diagnosis not present

## 2015-04-08 DIAGNOSIS — L02211 Cutaneous abscess of abdominal wall: Secondary | ICD-10-CM | POA: Diagnosis not present

## 2015-04-08 DIAGNOSIS — B9562 Methicillin resistant Staphylococcus aureus infection as the cause of diseases classified elsewhere: Secondary | ICD-10-CM | POA: Diagnosis not present

## 2015-04-09 DIAGNOSIS — L02211 Cutaneous abscess of abdominal wall: Secondary | ICD-10-CM | POA: Diagnosis not present

## 2015-04-09 DIAGNOSIS — I10 Essential (primary) hypertension: Secondary | ICD-10-CM | POA: Diagnosis not present

## 2015-04-09 DIAGNOSIS — E785 Hyperlipidemia, unspecified: Secondary | ICD-10-CM | POA: Diagnosis not present

## 2015-04-09 DIAGNOSIS — J45909 Unspecified asthma, uncomplicated: Secondary | ICD-10-CM | POA: Diagnosis not present

## 2015-04-09 DIAGNOSIS — I2510 Atherosclerotic heart disease of native coronary artery without angina pectoris: Secondary | ICD-10-CM | POA: Diagnosis not present

## 2015-04-13 DIAGNOSIS — I2510 Atherosclerotic heart disease of native coronary artery without angina pectoris: Secondary | ICD-10-CM | POA: Diagnosis not present

## 2015-04-13 DIAGNOSIS — E785 Hyperlipidemia, unspecified: Secondary | ICD-10-CM | POA: Diagnosis not present

## 2015-04-13 DIAGNOSIS — L02211 Cutaneous abscess of abdominal wall: Secondary | ICD-10-CM | POA: Diagnosis not present

## 2015-04-13 DIAGNOSIS — I10 Essential (primary) hypertension: Secondary | ICD-10-CM | POA: Diagnosis not present

## 2015-04-13 DIAGNOSIS — J45909 Unspecified asthma, uncomplicated: Secondary | ICD-10-CM | POA: Diagnosis not present

## 2015-04-20 DIAGNOSIS — J45909 Unspecified asthma, uncomplicated: Secondary | ICD-10-CM | POA: Diagnosis not present

## 2015-04-20 DIAGNOSIS — L02211 Cutaneous abscess of abdominal wall: Secondary | ICD-10-CM | POA: Diagnosis not present

## 2015-04-20 DIAGNOSIS — I2510 Atherosclerotic heart disease of native coronary artery without angina pectoris: Secondary | ICD-10-CM | POA: Diagnosis not present

## 2015-04-20 DIAGNOSIS — I10 Essential (primary) hypertension: Secondary | ICD-10-CM | POA: Diagnosis not present

## 2015-04-20 DIAGNOSIS — E785 Hyperlipidemia, unspecified: Secondary | ICD-10-CM | POA: Diagnosis not present

## 2015-04-21 DIAGNOSIS — A4902 Methicillin resistant Staphylococcus aureus infection, unspecified site: Secondary | ICD-10-CM | POA: Diagnosis not present

## 2015-04-21 DIAGNOSIS — L02211 Cutaneous abscess of abdominal wall: Secondary | ICD-10-CM | POA: Diagnosis not present

## 2015-04-21 DIAGNOSIS — R110 Nausea: Secondary | ICD-10-CM | POA: Diagnosis not present

## 2015-04-21 DIAGNOSIS — Z09 Encounter for follow-up examination after completed treatment for conditions other than malignant neoplasm: Secondary | ICD-10-CM | POA: Diagnosis not present

## 2015-04-22 DIAGNOSIS — I495 Sick sinus syndrome: Secondary | ICD-10-CM | POA: Diagnosis not present

## 2015-04-22 DIAGNOSIS — Z950 Presence of cardiac pacemaker: Secondary | ICD-10-CM | POA: Diagnosis not present

## 2015-04-22 DIAGNOSIS — L02211 Cutaneous abscess of abdominal wall: Secondary | ICD-10-CM | POA: Diagnosis not present

## 2015-04-22 DIAGNOSIS — E785 Hyperlipidemia, unspecified: Secondary | ICD-10-CM | POA: Diagnosis not present

## 2015-04-22 DIAGNOSIS — J45909 Unspecified asthma, uncomplicated: Secondary | ICD-10-CM | POA: Diagnosis not present

## 2015-04-22 DIAGNOSIS — B9562 Methicillin resistant Staphylococcus aureus infection as the cause of diseases classified elsewhere: Secondary | ICD-10-CM | POA: Diagnosis not present

## 2015-04-22 DIAGNOSIS — I2510 Atherosclerotic heart disease of native coronary artery without angina pectoris: Secondary | ICD-10-CM | POA: Diagnosis not present

## 2015-04-22 DIAGNOSIS — I10 Essential (primary) hypertension: Secondary | ICD-10-CM | POA: Diagnosis not present

## 2015-04-27 DIAGNOSIS — I2510 Atherosclerotic heart disease of native coronary artery without angina pectoris: Secondary | ICD-10-CM | POA: Diagnosis not present

## 2015-04-27 DIAGNOSIS — I495 Sick sinus syndrome: Secondary | ICD-10-CM | POA: Diagnosis not present

## 2015-04-27 DIAGNOSIS — I10 Essential (primary) hypertension: Secondary | ICD-10-CM | POA: Diagnosis not present

## 2015-04-27 DIAGNOSIS — L02211 Cutaneous abscess of abdominal wall: Secondary | ICD-10-CM | POA: Diagnosis not present

## 2015-04-27 DIAGNOSIS — B9562 Methicillin resistant Staphylococcus aureus infection as the cause of diseases classified elsewhere: Secondary | ICD-10-CM | POA: Diagnosis not present

## 2015-04-27 DIAGNOSIS — J45909 Unspecified asthma, uncomplicated: Secondary | ICD-10-CM | POA: Diagnosis not present

## 2015-04-28 DIAGNOSIS — L02211 Cutaneous abscess of abdominal wall: Secondary | ICD-10-CM | POA: Diagnosis not present

## 2015-04-28 DIAGNOSIS — I10 Essential (primary) hypertension: Secondary | ICD-10-CM | POA: Diagnosis not present

## 2015-04-28 DIAGNOSIS — B9562 Methicillin resistant Staphylococcus aureus infection as the cause of diseases classified elsewhere: Secondary | ICD-10-CM | POA: Diagnosis not present

## 2015-04-28 DIAGNOSIS — I495 Sick sinus syndrome: Secondary | ICD-10-CM | POA: Diagnosis not present

## 2015-04-28 DIAGNOSIS — J45909 Unspecified asthma, uncomplicated: Secondary | ICD-10-CM | POA: Diagnosis not present

## 2015-04-28 DIAGNOSIS — I2510 Atherosclerotic heart disease of native coronary artery without angina pectoris: Secondary | ICD-10-CM | POA: Diagnosis not present

## 2015-05-04 DIAGNOSIS — I495 Sick sinus syndrome: Secondary | ICD-10-CM | POA: Diagnosis not present

## 2015-05-04 DIAGNOSIS — L02211 Cutaneous abscess of abdominal wall: Secondary | ICD-10-CM | POA: Diagnosis not present

## 2015-05-04 DIAGNOSIS — J45909 Unspecified asthma, uncomplicated: Secondary | ICD-10-CM | POA: Diagnosis not present

## 2015-05-04 DIAGNOSIS — I2510 Atherosclerotic heart disease of native coronary artery without angina pectoris: Secondary | ICD-10-CM | POA: Diagnosis not present

## 2015-05-04 DIAGNOSIS — B9562 Methicillin resistant Staphylococcus aureus infection as the cause of diseases classified elsewhere: Secondary | ICD-10-CM | POA: Diagnosis not present

## 2015-05-04 DIAGNOSIS — I10 Essential (primary) hypertension: Secondary | ICD-10-CM | POA: Diagnosis not present

## 2015-05-05 DIAGNOSIS — Z950 Presence of cardiac pacemaker: Secondary | ICD-10-CM | POA: Diagnosis not present

## 2015-05-11 DIAGNOSIS — I10 Essential (primary) hypertension: Secondary | ICD-10-CM | POA: Diagnosis not present

## 2015-05-11 DIAGNOSIS — J45909 Unspecified asthma, uncomplicated: Secondary | ICD-10-CM | POA: Diagnosis not present

## 2015-05-11 DIAGNOSIS — I2510 Atherosclerotic heart disease of native coronary artery without angina pectoris: Secondary | ICD-10-CM | POA: Diagnosis not present

## 2015-05-11 DIAGNOSIS — L02211 Cutaneous abscess of abdominal wall: Secondary | ICD-10-CM | POA: Diagnosis not present

## 2015-05-11 DIAGNOSIS — I495 Sick sinus syndrome: Secondary | ICD-10-CM | POA: Diagnosis not present

## 2015-05-11 DIAGNOSIS — B9562 Methicillin resistant Staphylococcus aureus infection as the cause of diseases classified elsewhere: Secondary | ICD-10-CM | POA: Diagnosis not present

## 2015-05-18 DIAGNOSIS — B9562 Methicillin resistant Staphylococcus aureus infection as the cause of diseases classified elsewhere: Secondary | ICD-10-CM | POA: Diagnosis not present

## 2015-05-18 DIAGNOSIS — I2510 Atherosclerotic heart disease of native coronary artery without angina pectoris: Secondary | ICD-10-CM | POA: Diagnosis not present

## 2015-05-18 DIAGNOSIS — I495 Sick sinus syndrome: Secondary | ICD-10-CM | POA: Diagnosis not present

## 2015-05-18 DIAGNOSIS — J45909 Unspecified asthma, uncomplicated: Secondary | ICD-10-CM | POA: Diagnosis not present

## 2015-05-18 DIAGNOSIS — I10 Essential (primary) hypertension: Secondary | ICD-10-CM | POA: Diagnosis not present

## 2015-05-18 DIAGNOSIS — L02211 Cutaneous abscess of abdominal wall: Secondary | ICD-10-CM | POA: Diagnosis not present

## 2015-05-20 DIAGNOSIS — B9562 Methicillin resistant Staphylococcus aureus infection as the cause of diseases classified elsewhere: Secondary | ICD-10-CM | POA: Diagnosis not present

## 2015-05-20 DIAGNOSIS — T8579XA Infection and inflammatory reaction due to other internal prosthetic devices, implants and grafts, initial encounter: Secondary | ICD-10-CM | POA: Diagnosis not present

## 2015-05-25 DIAGNOSIS — I495 Sick sinus syndrome: Secondary | ICD-10-CM | POA: Diagnosis not present

## 2015-05-25 DIAGNOSIS — B9562 Methicillin resistant Staphylococcus aureus infection as the cause of diseases classified elsewhere: Secondary | ICD-10-CM | POA: Diagnosis not present

## 2015-05-25 DIAGNOSIS — J45909 Unspecified asthma, uncomplicated: Secondary | ICD-10-CM | POA: Diagnosis not present

## 2015-05-25 DIAGNOSIS — I2510 Atherosclerotic heart disease of native coronary artery without angina pectoris: Secondary | ICD-10-CM | POA: Diagnosis not present

## 2015-05-25 DIAGNOSIS — L02211 Cutaneous abscess of abdominal wall: Secondary | ICD-10-CM | POA: Diagnosis not present

## 2015-05-25 DIAGNOSIS — I10 Essential (primary) hypertension: Secondary | ICD-10-CM | POA: Diagnosis not present

## 2015-06-01 DIAGNOSIS — I2510 Atherosclerotic heart disease of native coronary artery without angina pectoris: Secondary | ICD-10-CM | POA: Diagnosis not present

## 2015-06-01 DIAGNOSIS — L02211 Cutaneous abscess of abdominal wall: Secondary | ICD-10-CM | POA: Diagnosis not present

## 2015-06-01 DIAGNOSIS — I495 Sick sinus syndrome: Secondary | ICD-10-CM | POA: Diagnosis not present

## 2015-06-01 DIAGNOSIS — B9562 Methicillin resistant Staphylococcus aureus infection as the cause of diseases classified elsewhere: Secondary | ICD-10-CM | POA: Diagnosis not present

## 2015-06-01 DIAGNOSIS — I10 Essential (primary) hypertension: Secondary | ICD-10-CM | POA: Diagnosis not present

## 2015-06-01 DIAGNOSIS — J45909 Unspecified asthma, uncomplicated: Secondary | ICD-10-CM | POA: Diagnosis not present

## 2015-06-08 DIAGNOSIS — I10 Essential (primary) hypertension: Secondary | ICD-10-CM | POA: Diagnosis not present

## 2015-06-08 DIAGNOSIS — J45909 Unspecified asthma, uncomplicated: Secondary | ICD-10-CM | POA: Diagnosis not present

## 2015-06-08 DIAGNOSIS — L02211 Cutaneous abscess of abdominal wall: Secondary | ICD-10-CM | POA: Diagnosis not present

## 2015-06-08 DIAGNOSIS — I495 Sick sinus syndrome: Secondary | ICD-10-CM | POA: Diagnosis not present

## 2015-06-08 DIAGNOSIS — B9562 Methicillin resistant Staphylococcus aureus infection as the cause of diseases classified elsewhere: Secondary | ICD-10-CM | POA: Diagnosis not present

## 2015-06-08 DIAGNOSIS — I2510 Atherosclerotic heart disease of native coronary artery without angina pectoris: Secondary | ICD-10-CM | POA: Diagnosis not present

## 2015-06-09 DIAGNOSIS — E669 Obesity, unspecified: Secondary | ICD-10-CM | POA: Diagnosis not present

## 2015-06-09 DIAGNOSIS — K219 Gastro-esophageal reflux disease without esophagitis: Secondary | ICD-10-CM | POA: Diagnosis not present

## 2015-06-09 DIAGNOSIS — Z955 Presence of coronary angioplasty implant and graft: Secondary | ICD-10-CM | POA: Diagnosis not present

## 2015-06-09 DIAGNOSIS — F419 Anxiety disorder, unspecified: Secondary | ICD-10-CM | POA: Diagnosis not present

## 2015-06-09 DIAGNOSIS — A4902 Methicillin resistant Staphylococcus aureus infection, unspecified site: Secondary | ICD-10-CM | POA: Diagnosis not present

## 2015-06-09 DIAGNOSIS — I2510 Atherosclerotic heart disease of native coronary artery without angina pectoris: Secondary | ICD-10-CM | POA: Diagnosis not present

## 2015-06-09 DIAGNOSIS — Z853 Personal history of malignant neoplasm of breast: Secondary | ICD-10-CM | POA: Diagnosis not present

## 2015-06-09 DIAGNOSIS — I10 Essential (primary) hypertension: Secondary | ICD-10-CM | POA: Diagnosis not present

## 2015-06-09 DIAGNOSIS — K432 Incisional hernia without obstruction or gangrene: Secondary | ICD-10-CM | POA: Diagnosis not present

## 2015-06-09 DIAGNOSIS — R9431 Abnormal electrocardiogram [ECG] [EKG]: Secondary | ICD-10-CM | POA: Diagnosis not present

## 2015-06-09 DIAGNOSIS — Z01818 Encounter for other preprocedural examination: Secondary | ICD-10-CM | POA: Diagnosis not present

## 2015-06-09 DIAGNOSIS — Z6829 Body mass index (BMI) 29.0-29.9, adult: Secondary | ICD-10-CM | POA: Diagnosis not present

## 2015-06-09 DIAGNOSIS — E785 Hyperlipidemia, unspecified: Secondary | ICD-10-CM | POA: Diagnosis not present

## 2015-06-09 DIAGNOSIS — Z01810 Encounter for preprocedural cardiovascular examination: Secondary | ICD-10-CM | POA: Diagnosis not present

## 2015-06-09 DIAGNOSIS — Z950 Presence of cardiac pacemaker: Secondary | ICD-10-CM | POA: Diagnosis not present

## 2015-06-15 DIAGNOSIS — I10 Essential (primary) hypertension: Secondary | ICD-10-CM | POA: Diagnosis not present

## 2015-06-15 DIAGNOSIS — J45909 Unspecified asthma, uncomplicated: Secondary | ICD-10-CM | POA: Diagnosis not present

## 2015-06-15 DIAGNOSIS — L02211 Cutaneous abscess of abdominal wall: Secondary | ICD-10-CM | POA: Diagnosis not present

## 2015-06-15 DIAGNOSIS — B9562 Methicillin resistant Staphylococcus aureus infection as the cause of diseases classified elsewhere: Secondary | ICD-10-CM | POA: Diagnosis not present

## 2015-06-15 DIAGNOSIS — I2510 Atherosclerotic heart disease of native coronary artery without angina pectoris: Secondary | ICD-10-CM | POA: Diagnosis not present

## 2015-06-15 DIAGNOSIS — I495 Sick sinus syndrome: Secondary | ICD-10-CM | POA: Diagnosis not present

## 2015-06-16 DIAGNOSIS — Z4801 Encounter for change or removal of surgical wound dressing: Secondary | ICD-10-CM | POA: Diagnosis not present

## 2015-06-16 DIAGNOSIS — R918 Other nonspecific abnormal finding of lung field: Secondary | ICD-10-CM | POA: Diagnosis not present

## 2015-06-16 DIAGNOSIS — R000 Tachycardia, unspecified: Secondary | ICD-10-CM | POA: Diagnosis not present

## 2015-06-16 DIAGNOSIS — M1990 Unspecified osteoarthritis, unspecified site: Secondary | ICD-10-CM | POA: Diagnosis present

## 2015-06-16 DIAGNOSIS — K632 Fistula of intestine: Secondary | ICD-10-CM | POA: Diagnosis present

## 2015-06-16 DIAGNOSIS — R739 Hyperglycemia, unspecified: Secondary | ICD-10-CM | POA: Diagnosis not present

## 2015-06-16 DIAGNOSIS — G4733 Obstructive sleep apnea (adult) (pediatric): Secondary | ICD-10-CM | POA: Diagnosis present

## 2015-06-16 DIAGNOSIS — R278 Other lack of coordination: Secondary | ICD-10-CM | POA: Diagnosis not present

## 2015-06-16 DIAGNOSIS — I2510 Atherosclerotic heart disease of native coronary artery without angina pectoris: Secondary | ICD-10-CM | POA: Diagnosis present

## 2015-06-16 DIAGNOSIS — L02211 Cutaneous abscess of abdominal wall: Secondary | ICD-10-CM | POA: Diagnosis not present

## 2015-06-16 DIAGNOSIS — D649 Anemia, unspecified: Secondary | ICD-10-CM | POA: Diagnosis present

## 2015-06-16 DIAGNOSIS — E669 Obesity, unspecified: Secondary | ICD-10-CM | POA: Diagnosis present

## 2015-06-16 DIAGNOSIS — Z950 Presence of cardiac pacemaker: Secondary | ICD-10-CM | POA: Diagnosis not present

## 2015-06-16 DIAGNOSIS — K6389 Other specified diseases of intestine: Secondary | ICD-10-CM | POA: Diagnosis not present

## 2015-06-16 DIAGNOSIS — K432 Incisional hernia without obstruction or gangrene: Secondary | ICD-10-CM | POA: Diagnosis present

## 2015-06-16 DIAGNOSIS — K430 Incisional hernia with obstruction, without gangrene: Secondary | ICD-10-CM | POA: Diagnosis not present

## 2015-06-16 DIAGNOSIS — T8579XA Infection and inflammatory reaction due to other internal prosthetic devices, implants and grafts, initial encounter: Secondary | ICD-10-CM | POA: Diagnosis present

## 2015-06-16 DIAGNOSIS — G629 Polyneuropathy, unspecified: Secondary | ICD-10-CM | POA: Diagnosis present

## 2015-06-16 DIAGNOSIS — Z96652 Presence of left artificial knee joint: Secondary | ICD-10-CM | POA: Diagnosis present

## 2015-06-16 DIAGNOSIS — Z9049 Acquired absence of other specified parts of digestive tract: Secondary | ICD-10-CM | POA: Diagnosis present

## 2015-06-16 DIAGNOSIS — J90 Pleural effusion, not elsewhere classified: Secondary | ICD-10-CM | POA: Diagnosis not present

## 2015-06-16 DIAGNOSIS — Z955 Presence of coronary angioplasty implant and graft: Secondary | ICD-10-CM | POA: Diagnosis not present

## 2015-06-16 DIAGNOSIS — I10 Essential (primary) hypertension: Secondary | ICD-10-CM | POA: Diagnosis present

## 2015-06-16 DIAGNOSIS — E876 Hypokalemia: Secondary | ICD-10-CM | POA: Diagnosis not present

## 2015-06-16 DIAGNOSIS — R0602 Shortness of breath: Secondary | ICD-10-CM | POA: Diagnosis not present

## 2015-06-16 DIAGNOSIS — Z48815 Encounter for surgical aftercare following surgery on the digestive system: Secondary | ICD-10-CM | POA: Diagnosis not present

## 2015-06-16 DIAGNOSIS — A419 Sepsis, unspecified organism: Secondary | ICD-10-CM | POA: Diagnosis not present

## 2015-06-16 DIAGNOSIS — Z79899 Other long term (current) drug therapy: Secondary | ICD-10-CM | POA: Diagnosis not present

## 2015-06-16 DIAGNOSIS — J189 Pneumonia, unspecified organism: Secondary | ICD-10-CM | POA: Diagnosis not present

## 2015-06-16 DIAGNOSIS — Z8614 Personal history of Methicillin resistant Staphylococcus aureus infection: Secondary | ICD-10-CM | POA: Diagnosis not present

## 2015-06-16 DIAGNOSIS — Z90710 Acquired absence of both cervix and uterus: Secondary | ICD-10-CM | POA: Diagnosis not present

## 2015-06-16 DIAGNOSIS — E785 Hyperlipidemia, unspecified: Secondary | ICD-10-CM | POA: Diagnosis present

## 2015-06-16 DIAGNOSIS — M6281 Muscle weakness (generalized): Secondary | ICD-10-CM | POA: Diagnosis not present

## 2015-06-16 DIAGNOSIS — Z792 Long term (current) use of antibiotics: Secondary | ICD-10-CM | POA: Diagnosis not present

## 2015-06-16 DIAGNOSIS — Z96651 Presence of right artificial knee joint: Secondary | ICD-10-CM | POA: Diagnosis present

## 2015-06-16 DIAGNOSIS — K567 Ileus, unspecified: Secondary | ICD-10-CM | POA: Diagnosis not present

## 2015-06-16 DIAGNOSIS — R41841 Cognitive communication deficit: Secondary | ICD-10-CM | POA: Diagnosis not present

## 2015-07-01 DIAGNOSIS — K5900 Constipation, unspecified: Secondary | ICD-10-CM | POA: Diagnosis not present

## 2015-07-01 DIAGNOSIS — M792 Neuralgia and neuritis, unspecified: Secondary | ICD-10-CM | POA: Diagnosis not present

## 2015-07-01 DIAGNOSIS — R609 Edema, unspecified: Secondary | ICD-10-CM | POA: Diagnosis not present

## 2015-07-01 DIAGNOSIS — R278 Other lack of coordination: Secondary | ICD-10-CM | POA: Diagnosis not present

## 2015-07-01 DIAGNOSIS — R41841 Cognitive communication deficit: Secondary | ICD-10-CM | POA: Diagnosis not present

## 2015-07-01 DIAGNOSIS — I495 Sick sinus syndrome: Secondary | ICD-10-CM | POA: Diagnosis not present

## 2015-07-01 DIAGNOSIS — R0602 Shortness of breath: Secondary | ICD-10-CM | POA: Diagnosis not present

## 2015-07-01 DIAGNOSIS — R5381 Other malaise: Secondary | ICD-10-CM | POA: Diagnosis not present

## 2015-07-01 DIAGNOSIS — T8579XS Infection and inflammatory reaction due to other internal prosthetic devices, implants and grafts, sequela: Secondary | ICD-10-CM | POA: Diagnosis not present

## 2015-07-01 DIAGNOSIS — G629 Polyneuropathy, unspecified: Secondary | ICD-10-CM | POA: Diagnosis not present

## 2015-07-01 DIAGNOSIS — M1990 Unspecified osteoarthritis, unspecified site: Secondary | ICD-10-CM | POA: Diagnosis not present

## 2015-07-01 DIAGNOSIS — Z4801 Encounter for change or removal of surgical wound dressing: Secondary | ICD-10-CM | POA: Diagnosis not present

## 2015-07-01 DIAGNOSIS — I10 Essential (primary) hypertension: Secondary | ICD-10-CM | POA: Diagnosis not present

## 2015-07-01 DIAGNOSIS — S31109D Unspecified open wound of abdominal wall, unspecified quadrant without penetration into peritoneal cavity, subsequent encounter: Secondary | ICD-10-CM | POA: Diagnosis not present

## 2015-07-01 DIAGNOSIS — I2510 Atherosclerotic heart disease of native coronary artery without angina pectoris: Secondary | ICD-10-CM | POA: Diagnosis not present

## 2015-07-01 DIAGNOSIS — E46 Unspecified protein-calorie malnutrition: Secondary | ICD-10-CM | POA: Diagnosis not present

## 2015-07-01 DIAGNOSIS — M6281 Muscle weakness (generalized): Secondary | ICD-10-CM | POA: Diagnosis not present

## 2015-07-01 DIAGNOSIS — K219 Gastro-esophageal reflux disease without esophagitis: Secondary | ICD-10-CM | POA: Diagnosis not present

## 2015-07-01 DIAGNOSIS — Z48815 Encounter for surgical aftercare following surgery on the digestive system: Secondary | ICD-10-CM | POA: Diagnosis not present

## 2015-07-01 DIAGNOSIS — F329 Major depressive disorder, single episode, unspecified: Secondary | ICD-10-CM | POA: Diagnosis not present

## 2015-07-06 ENCOUNTER — Non-Acute Institutional Stay (SKILLED_NURSING_FACILITY): Payer: Medicare Other | Admitting: Internal Medicine

## 2015-07-06 DIAGNOSIS — K219 Gastro-esophageal reflux disease without esophagitis: Secondary | ICD-10-CM | POA: Diagnosis not present

## 2015-07-06 DIAGNOSIS — I495 Sick sinus syndrome: Secondary | ICD-10-CM | POA: Diagnosis not present

## 2015-07-06 DIAGNOSIS — I2510 Atherosclerotic heart disease of native coronary artery without angina pectoris: Secondary | ICD-10-CM | POA: Diagnosis not present

## 2015-07-06 DIAGNOSIS — M792 Neuralgia and neuritis, unspecified: Secondary | ICD-10-CM

## 2015-07-06 DIAGNOSIS — E46 Unspecified protein-calorie malnutrition: Secondary | ICD-10-CM | POA: Diagnosis not present

## 2015-07-06 DIAGNOSIS — T8579XS Infection and inflammatory reaction due to other internal prosthetic devices, implants and grafts, sequela: Secondary | ICD-10-CM | POA: Diagnosis not present

## 2015-07-06 DIAGNOSIS — R5381 Other malaise: Secondary | ICD-10-CM | POA: Diagnosis not present

## 2015-07-06 DIAGNOSIS — F329 Major depressive disorder, single episode, unspecified: Secondary | ICD-10-CM | POA: Diagnosis not present

## 2015-07-06 DIAGNOSIS — K5900 Constipation, unspecified: Secondary | ICD-10-CM

## 2015-07-06 DIAGNOSIS — R609 Edema, unspecified: Secondary | ICD-10-CM

## 2015-07-06 DIAGNOSIS — F32A Depression, unspecified: Secondary | ICD-10-CM
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
## 2015-07-14 ENCOUNTER — Other Ambulatory Visit: Payer: Self-pay | Admitting: *Deleted

## 2015-07-14 DIAGNOSIS — S31109D Unspecified open wound of abdominal wall, unspecified quadrant without penetration into peritoneal cavity, subsequent encounter: Secondary | ICD-10-CM | POA: Diagnosis not present

## 2015-07-14 MED ORDER — OXYCODONE HCL 5 MG PO TABS: ORAL_TABLET | ORAL | Status: DC

## 2015-07-14 NOTE — Telephone Encounter (Signed)
Jill Bowman medical [employer]

## 2015-07-15 DIAGNOSIS — S31109A Unspecified open wound of abdominal wall, unspecified quadrant without penetration into peritoneal cavity, initial encounter: Secondary | ICD-10-CM | POA: Insufficient documentation

## 2015-07-19 ENCOUNTER — Non-Acute Institutional Stay (SKILLED_NURSING_FACILITY): Payer: Medicare Other | Admitting: Nurse Practitioner

## 2015-07-19 DIAGNOSIS — K5900 Constipation, unspecified: Secondary | ICD-10-CM

## 2015-07-19 DIAGNOSIS — E46 Unspecified protein-calorie malnutrition: Secondary | ICD-10-CM | POA: Diagnosis not present

## 2015-07-19 DIAGNOSIS — T8579XS Infection and inflammatory reaction due to other internal prosthetic devices, implants and grafts, sequela: Secondary | ICD-10-CM

## 2015-07-19 DIAGNOSIS — M792 Neuralgia and neuritis, unspecified: Secondary | ICD-10-CM | POA: Diagnosis not present

## 2015-07-19 DIAGNOSIS — I495 Sick sinus syndrome: Secondary | ICD-10-CM | POA: Diagnosis not present

## 2015-07-19 DIAGNOSIS — K219 Gastro-esophageal reflux disease without esophagitis: Secondary | ICD-10-CM

## 2015-07-19 DIAGNOSIS — I2510 Atherosclerotic heart disease of native coronary artery without angina pectoris: Secondary | ICD-10-CM
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
## 2015-07-20 ENCOUNTER — Other Ambulatory Visit: Payer: Self-pay | Admitting: *Deleted

## 2015-07-20 MED ORDER — HYDROCODONE-ACETAMINOPHEN 5-325 MG PO TABS: ORAL_TABLET | ORAL | Status: DC

## 2015-07-20 NOTE — Telephone Encounter (Signed)
[facility] 

## 2015-07-22 DIAGNOSIS — I2510 Atherosclerotic heart disease of native coronary artery without angina pectoris: Secondary | ICD-10-CM | POA: Diagnosis not present

## 2015-07-22 DIAGNOSIS — T8189XD Other complications of procedures, not elsewhere classified, subsequent encounter: Secondary | ICD-10-CM | POA: Diagnosis not present

## 2015-07-22 DIAGNOSIS — J45909 Unspecified asthma, uncomplicated: Secondary | ICD-10-CM | POA: Diagnosis not present

## 2015-07-22 DIAGNOSIS — I10 Essential (primary) hypertension: Secondary | ICD-10-CM | POA: Diagnosis not present

## 2015-07-22 DIAGNOSIS — I4891 Unspecified atrial fibrillation: Secondary | ICD-10-CM | POA: Diagnosis not present

## 2015-07-22 DIAGNOSIS — Z6841 Body Mass Index (BMI) 40.0 and over, adult: Secondary | ICD-10-CM | POA: Diagnosis not present

## 2015-07-22 DIAGNOSIS — Z950 Presence of cardiac pacemaker: Secondary | ICD-10-CM | POA: Diagnosis not present

## 2015-07-24 DIAGNOSIS — I10 Essential (primary) hypertension: Secondary | ICD-10-CM | POA: Diagnosis not present

## 2015-07-24 DIAGNOSIS — J45909 Unspecified asthma, uncomplicated: Secondary | ICD-10-CM | POA: Diagnosis not present

## 2015-07-24 DIAGNOSIS — I2510 Atherosclerotic heart disease of native coronary artery without angina pectoris: Secondary | ICD-10-CM | POA: Diagnosis not present

## 2015-07-24 DIAGNOSIS — T8189XD Other complications of procedures, not elsewhere classified, subsequent encounter: Secondary | ICD-10-CM | POA: Diagnosis not present

## 2015-07-24 DIAGNOSIS — I4891 Unspecified atrial fibrillation: Secondary | ICD-10-CM | POA: Diagnosis not present

## 2015-07-26 DIAGNOSIS — I10 Essential (primary) hypertension: Secondary | ICD-10-CM | POA: Diagnosis not present

## 2015-07-26 DIAGNOSIS — T8189XD Other complications of procedures, not elsewhere classified, subsequent encounter: Secondary | ICD-10-CM | POA: Diagnosis not present

## 2015-07-26 DIAGNOSIS — J45909 Unspecified asthma, uncomplicated: Secondary | ICD-10-CM | POA: Diagnosis not present

## 2015-07-26 DIAGNOSIS — I4891 Unspecified atrial fibrillation: Secondary | ICD-10-CM | POA: Diagnosis not present

## 2015-07-26 DIAGNOSIS — I2510 Atherosclerotic heart disease of native coronary artery without angina pectoris: Secondary | ICD-10-CM | POA: Diagnosis not present

## 2015-07-28 DIAGNOSIS — I10 Essential (primary) hypertension: Secondary | ICD-10-CM | POA: Diagnosis not present

## 2015-07-28 DIAGNOSIS — J45909 Unspecified asthma, uncomplicated: Secondary | ICD-10-CM | POA: Diagnosis not present

## 2015-07-28 DIAGNOSIS — T8189XD Other complications of procedures, not elsewhere classified, subsequent encounter: Secondary | ICD-10-CM | POA: Diagnosis not present

## 2015-07-28 DIAGNOSIS — I4891 Unspecified atrial fibrillation: Secondary | ICD-10-CM | POA: Diagnosis not present

## 2015-07-28 DIAGNOSIS — I2510 Atherosclerotic heart disease of native coronary artery without angina pectoris: Secondary | ICD-10-CM | POA: Diagnosis not present

## 2015-07-30 DIAGNOSIS — J45909 Unspecified asthma, uncomplicated: Secondary | ICD-10-CM | POA: Diagnosis not present

## 2015-07-30 DIAGNOSIS — I10 Essential (primary) hypertension: Secondary | ICD-10-CM | POA: Diagnosis not present

## 2015-07-30 DIAGNOSIS — I4891 Unspecified atrial fibrillation: Secondary | ICD-10-CM | POA: Diagnosis not present

## 2015-07-30 DIAGNOSIS — I2510 Atherosclerotic heart disease of native coronary artery without angina pectoris: Secondary | ICD-10-CM | POA: Diagnosis not present

## 2015-07-30 DIAGNOSIS — T8189XD Other complications of procedures, not elsewhere classified, subsequent encounter: Secondary | ICD-10-CM | POA: Diagnosis not present

## 2015-08-02 DIAGNOSIS — I4891 Unspecified atrial fibrillation: Secondary | ICD-10-CM | POA: Diagnosis not present

## 2015-08-02 DIAGNOSIS — I10 Essential (primary) hypertension: Secondary | ICD-10-CM | POA: Diagnosis not present

## 2015-08-02 DIAGNOSIS — J45909 Unspecified asthma, uncomplicated: Secondary | ICD-10-CM | POA: Diagnosis not present

## 2015-08-02 DIAGNOSIS — T8189XD Other complications of procedures, not elsewhere classified, subsequent encounter: Secondary | ICD-10-CM | POA: Diagnosis not present

## 2015-08-02 DIAGNOSIS — I2510 Atherosclerotic heart disease of native coronary artery without angina pectoris: Secondary | ICD-10-CM | POA: Diagnosis not present

## 2015-08-04 DIAGNOSIS — I129 Hypertensive chronic kidney disease with stage 1 through stage 4 chronic kidney disease, or unspecified chronic kidney disease: Secondary | ICD-10-CM | POA: Diagnosis not present

## 2015-08-04 DIAGNOSIS — R51 Headache: Secondary | ICD-10-CM | POA: Diagnosis not present

## 2015-08-04 DIAGNOSIS — E782 Mixed hyperlipidemia: Secondary | ICD-10-CM | POA: Diagnosis not present

## 2015-08-04 DIAGNOSIS — Z8614 Personal history of Methicillin resistant Staphylococcus aureus infection: Secondary | ICD-10-CM | POA: Diagnosis not present

## 2015-08-04 DIAGNOSIS — D72829 Elevated white blood cell count, unspecified: Secondary | ICD-10-CM | POA: Diagnosis not present

## 2015-08-04 DIAGNOSIS — A4902 Methicillin resistant Staphylococcus aureus infection, unspecified site: Secondary | ICD-10-CM | POA: Diagnosis not present

## 2015-08-04 DIAGNOSIS — R413 Other amnesia: Secondary | ICD-10-CM | POA: Diagnosis not present

## 2015-08-04 DIAGNOSIS — Z833 Family history of diabetes mellitus: Secondary | ICD-10-CM | POA: Diagnosis not present

## 2015-08-04 DIAGNOSIS — Z23 Encounter for immunization: Secondary | ICD-10-CM | POA: Diagnosis not present

## 2015-08-04 DIAGNOSIS — I10 Essential (primary) hypertension: Secondary | ICD-10-CM | POA: Diagnosis not present

## 2015-08-04 DIAGNOSIS — L02211 Cutaneous abscess of abdominal wall: Secondary | ICD-10-CM | POA: Diagnosis not present

## 2015-08-05 DIAGNOSIS — T8189XD Other complications of procedures, not elsewhere classified, subsequent encounter: Secondary | ICD-10-CM | POA: Diagnosis not present

## 2015-08-05 DIAGNOSIS — I2510 Atherosclerotic heart disease of native coronary artery without angina pectoris: Secondary | ICD-10-CM | POA: Diagnosis not present

## 2015-08-05 DIAGNOSIS — I10 Essential (primary) hypertension: Secondary | ICD-10-CM | POA: Diagnosis not present

## 2015-08-05 DIAGNOSIS — J45909 Unspecified asthma, uncomplicated: Secondary | ICD-10-CM | POA: Diagnosis not present

## 2015-08-05 DIAGNOSIS — I4891 Unspecified atrial fibrillation: Secondary | ICD-10-CM | POA: Diagnosis not present

## 2015-08-10 DIAGNOSIS — I2510 Atherosclerotic heart disease of native coronary artery without angina pectoris: Secondary | ICD-10-CM | POA: Diagnosis not present

## 2015-08-10 DIAGNOSIS — I4891 Unspecified atrial fibrillation: Secondary | ICD-10-CM | POA: Diagnosis not present

## 2015-08-10 DIAGNOSIS — I10 Essential (primary) hypertension: Secondary | ICD-10-CM | POA: Diagnosis not present

## 2015-08-10 DIAGNOSIS — T8189XD Other complications of procedures, not elsewhere classified, subsequent encounter: Secondary | ICD-10-CM | POA: Diagnosis not present

## 2015-08-10 DIAGNOSIS — J45909 Unspecified asthma, uncomplicated: Secondary | ICD-10-CM | POA: Diagnosis not present

## 2015-08-12 DIAGNOSIS — T8579XD Infection and inflammatory reaction due to other internal prosthetic devices, implants and grafts, subsequent encounter: Secondary | ICD-10-CM | POA: Diagnosis not present

## 2015-08-19 DIAGNOSIS — I4891 Unspecified atrial fibrillation: Secondary | ICD-10-CM | POA: Diagnosis not present

## 2015-08-19 DIAGNOSIS — J45909 Unspecified asthma, uncomplicated: Secondary | ICD-10-CM | POA: Diagnosis not present

## 2015-08-19 DIAGNOSIS — I2510 Atherosclerotic heart disease of native coronary artery without angina pectoris: Secondary | ICD-10-CM | POA: Diagnosis not present

## 2015-08-19 DIAGNOSIS — T8189XD Other complications of procedures, not elsewhere classified, subsequent encounter: Secondary | ICD-10-CM | POA: Diagnosis not present

## 2015-08-19 DIAGNOSIS — I10 Essential (primary) hypertension: Secondary | ICD-10-CM | POA: Diagnosis not present

## 2015-08-24 DIAGNOSIS — J45909 Unspecified asthma, uncomplicated: Secondary | ICD-10-CM | POA: Diagnosis not present

## 2015-08-24 DIAGNOSIS — I10 Essential (primary) hypertension: Secondary | ICD-10-CM | POA: Diagnosis not present

## 2015-08-24 DIAGNOSIS — I2510 Atherosclerotic heart disease of native coronary artery without angina pectoris: Secondary | ICD-10-CM | POA: Diagnosis not present

## 2015-08-24 DIAGNOSIS — I4891 Unspecified atrial fibrillation: Secondary | ICD-10-CM | POA: Diagnosis not present

## 2015-08-24 DIAGNOSIS — T8189XD Other complications of procedures, not elsewhere classified, subsequent encounter: Secondary | ICD-10-CM | POA: Diagnosis not present

## 2015-08-31 DIAGNOSIS — T8189XD Other complications of procedures, not elsewhere classified, subsequent encounter: Secondary | ICD-10-CM | POA: Diagnosis not present

## 2015-08-31 DIAGNOSIS — I10 Essential (primary) hypertension: Secondary | ICD-10-CM | POA: Diagnosis not present

## 2015-08-31 DIAGNOSIS — I2510 Atherosclerotic heart disease of native coronary artery without angina pectoris: Secondary | ICD-10-CM | POA: Diagnosis not present

## 2015-08-31 DIAGNOSIS — I4891 Unspecified atrial fibrillation: Secondary | ICD-10-CM | POA: Diagnosis not present

## 2015-08-31 DIAGNOSIS — J45909 Unspecified asthma, uncomplicated: Secondary | ICD-10-CM | POA: Diagnosis not present

## 2015-09-02 DIAGNOSIS — Z950 Presence of cardiac pacemaker: Secondary | ICD-10-CM | POA: Diagnosis not present

## 2015-09-07 DIAGNOSIS — T8189XD Other complications of procedures, not elsewhere classified, subsequent encounter: Secondary | ICD-10-CM | POA: Diagnosis not present

## 2015-09-07 DIAGNOSIS — I2510 Atherosclerotic heart disease of native coronary artery without angina pectoris: Secondary | ICD-10-CM | POA: Diagnosis not present

## 2015-09-07 DIAGNOSIS — J45909 Unspecified asthma, uncomplicated: Secondary | ICD-10-CM | POA: Diagnosis not present

## 2015-09-07 DIAGNOSIS — I4891 Unspecified atrial fibrillation: Secondary | ICD-10-CM | POA: Diagnosis not present

## 2015-09-07 DIAGNOSIS — I10 Essential (primary) hypertension: Secondary | ICD-10-CM | POA: Diagnosis not present

## 2015-09-13 DIAGNOSIS — I471 Supraventricular tachycardia: Secondary | ICD-10-CM | POA: Diagnosis not present

## 2015-09-13 DIAGNOSIS — I2510 Atherosclerotic heart disease of native coronary artery without angina pectoris: Secondary | ICD-10-CM | POA: Diagnosis not present

## 2015-09-13 DIAGNOSIS — Z950 Presence of cardiac pacemaker: Secondary | ICD-10-CM | POA: Diagnosis not present

## 2015-09-13 DIAGNOSIS — Z9889 Other specified postprocedural states: Secondary | ICD-10-CM | POA: Diagnosis not present

## 2015-09-14 DIAGNOSIS — J45909 Unspecified asthma, uncomplicated: Secondary | ICD-10-CM | POA: Diagnosis not present

## 2015-09-14 DIAGNOSIS — I10 Essential (primary) hypertension: Secondary | ICD-10-CM | POA: Diagnosis not present

## 2015-09-14 DIAGNOSIS — T8189XD Other complications of procedures, not elsewhere classified, subsequent encounter: Secondary | ICD-10-CM | POA: Diagnosis not present

## 2015-09-14 DIAGNOSIS — I2510 Atherosclerotic heart disease of native coronary artery without angina pectoris: Secondary | ICD-10-CM | POA: Diagnosis not present

## 2015-09-14 DIAGNOSIS — I4891 Unspecified atrial fibrillation: Secondary | ICD-10-CM | POA: Diagnosis not present

## 2015-09-20 DIAGNOSIS — T8189XD Other complications of procedures, not elsewhere classified, subsequent encounter: Secondary | ICD-10-CM | POA: Diagnosis not present

## 2015-09-20 DIAGNOSIS — J45909 Unspecified asthma, uncomplicated: Secondary | ICD-10-CM | POA: Diagnosis not present

## 2015-09-20 DIAGNOSIS — I2510 Atherosclerotic heart disease of native coronary artery without angina pectoris: Secondary | ICD-10-CM | POA: Diagnosis not present

## 2015-09-20 DIAGNOSIS — Z950 Presence of cardiac pacemaker: Secondary | ICD-10-CM | POA: Diagnosis not present

## 2015-09-20 DIAGNOSIS — I10 Essential (primary) hypertension: Secondary | ICD-10-CM | POA: Diagnosis not present

## 2015-09-20 DIAGNOSIS — Z6841 Body Mass Index (BMI) 40.0 and over, adult: Secondary | ICD-10-CM | POA: Diagnosis not present

## 2015-09-20 DIAGNOSIS — I4891 Unspecified atrial fibrillation: Secondary | ICD-10-CM | POA: Diagnosis not present

## 2015-09-21 DIAGNOSIS — J45909 Unspecified asthma, uncomplicated: Secondary | ICD-10-CM | POA: Diagnosis not present

## 2015-09-21 DIAGNOSIS — T8189XD Other complications of procedures, not elsewhere classified, subsequent encounter: Secondary | ICD-10-CM | POA: Diagnosis not present

## 2015-09-21 DIAGNOSIS — I10 Essential (primary) hypertension: Secondary | ICD-10-CM | POA: Diagnosis not present

## 2015-09-21 DIAGNOSIS — I4891 Unspecified atrial fibrillation: Secondary | ICD-10-CM | POA: Diagnosis not present

## 2015-09-21 DIAGNOSIS — I2510 Atherosclerotic heart disease of native coronary artery without angina pectoris: Secondary | ICD-10-CM | POA: Diagnosis not present

## 2015-09-27 DIAGNOSIS — I2510 Atherosclerotic heart disease of native coronary artery without angina pectoris: Secondary | ICD-10-CM | POA: Diagnosis not present

## 2015-09-27 DIAGNOSIS — I10 Essential (primary) hypertension: Secondary | ICD-10-CM | POA: Diagnosis not present

## 2015-09-27 DIAGNOSIS — I4891 Unspecified atrial fibrillation: Secondary | ICD-10-CM | POA: Diagnosis not present

## 2015-09-27 DIAGNOSIS — J45909 Unspecified asthma, uncomplicated: Secondary | ICD-10-CM | POA: Diagnosis not present

## 2015-09-27 DIAGNOSIS — T8189XD Other complications of procedures, not elsewhere classified, subsequent encounter: Secondary | ICD-10-CM | POA: Diagnosis not present

## 2015-09-30 DIAGNOSIS — E1351 Other specified diabetes mellitus with diabetic peripheral angiopathy without gangrene: Secondary | ICD-10-CM | POA: Diagnosis not present

## 2015-09-30 DIAGNOSIS — L602 Onychogryphosis: Secondary | ICD-10-CM | POA: Diagnosis not present

## 2015-09-30 DIAGNOSIS — E1140 Type 2 diabetes mellitus with diabetic neuropathy, unspecified: Secondary | ICD-10-CM | POA: Diagnosis not present

## 2015-09-30 DIAGNOSIS — I70293 Other atherosclerosis of native arteries of extremities, bilateral legs: Secondary | ICD-10-CM | POA: Diagnosis not present

## 2015-10-05 DIAGNOSIS — I10 Essential (primary) hypertension: Secondary | ICD-10-CM | POA: Diagnosis not present

## 2015-10-05 DIAGNOSIS — I2510 Atherosclerotic heart disease of native coronary artery without angina pectoris: Secondary | ICD-10-CM | POA: Diagnosis not present

## 2015-10-05 DIAGNOSIS — J45909 Unspecified asthma, uncomplicated: Secondary | ICD-10-CM | POA: Diagnosis not present

## 2015-10-05 DIAGNOSIS — I4891 Unspecified atrial fibrillation: Secondary | ICD-10-CM | POA: Diagnosis not present

## 2015-10-05 DIAGNOSIS — T8189XD Other complications of procedures, not elsewhere classified, subsequent encounter: Secondary | ICD-10-CM | POA: Diagnosis not present

## 2015-10-12 DIAGNOSIS — J45909 Unspecified asthma, uncomplicated: Secondary | ICD-10-CM | POA: Diagnosis not present

## 2015-10-12 DIAGNOSIS — T8189XD Other complications of procedures, not elsewhere classified, subsequent encounter: Secondary | ICD-10-CM | POA: Diagnosis not present

## 2015-10-12 DIAGNOSIS — I2510 Atherosclerotic heart disease of native coronary artery without angina pectoris: Secondary | ICD-10-CM | POA: Diagnosis not present

## 2015-10-12 DIAGNOSIS — I10 Essential (primary) hypertension: Secondary | ICD-10-CM | POA: Diagnosis not present

## 2015-10-12 DIAGNOSIS — I4891 Unspecified atrial fibrillation: Secondary | ICD-10-CM | POA: Diagnosis not present

## 2015-10-14 DIAGNOSIS — T8189XD Other complications of procedures, not elsewhere classified, subsequent encounter: Secondary | ICD-10-CM | POA: Diagnosis not present

## 2015-10-14 DIAGNOSIS — Z48815 Encounter for surgical aftercare following surgery on the digestive system: Secondary | ICD-10-CM | POA: Diagnosis not present

## 2015-10-19 DIAGNOSIS — I10 Essential (primary) hypertension: Secondary | ICD-10-CM | POA: Diagnosis not present

## 2015-10-19 DIAGNOSIS — I2510 Atherosclerotic heart disease of native coronary artery without angina pectoris: Secondary | ICD-10-CM | POA: Diagnosis not present

## 2015-10-19 DIAGNOSIS — J45909 Unspecified asthma, uncomplicated: Secondary | ICD-10-CM | POA: Diagnosis not present

## 2015-10-19 DIAGNOSIS — I4891 Unspecified atrial fibrillation: Secondary | ICD-10-CM | POA: Diagnosis not present

## 2015-10-19 DIAGNOSIS — T8189XD Other complications of procedures, not elsewhere classified, subsequent encounter: Secondary | ICD-10-CM | POA: Diagnosis not present

## 2015-10-26 DIAGNOSIS — J45909 Unspecified asthma, uncomplicated: Secondary | ICD-10-CM | POA: Diagnosis not present

## 2015-10-26 DIAGNOSIS — I2510 Atherosclerotic heart disease of native coronary artery without angina pectoris: Secondary | ICD-10-CM | POA: Diagnosis not present

## 2015-10-26 DIAGNOSIS — I4891 Unspecified atrial fibrillation: Secondary | ICD-10-CM | POA: Diagnosis not present

## 2015-10-26 DIAGNOSIS — I10 Essential (primary) hypertension: Secondary | ICD-10-CM | POA: Diagnosis not present

## 2015-10-26 DIAGNOSIS — T8189XD Other complications of procedures, not elsewhere classified, subsequent encounter: Secondary | ICD-10-CM | POA: Diagnosis not present

## 2015-11-02 DIAGNOSIS — I2510 Atherosclerotic heart disease of native coronary artery without angina pectoris: Secondary | ICD-10-CM | POA: Diagnosis not present

## 2015-11-02 DIAGNOSIS — T8189XD Other complications of procedures, not elsewhere classified, subsequent encounter: Secondary | ICD-10-CM | POA: Diagnosis not present

## 2015-11-02 DIAGNOSIS — I4891 Unspecified atrial fibrillation: Secondary | ICD-10-CM | POA: Diagnosis not present

## 2015-11-02 DIAGNOSIS — I10 Essential (primary) hypertension: Secondary | ICD-10-CM | POA: Diagnosis not present

## 2015-11-02 DIAGNOSIS — J45909 Unspecified asthma, uncomplicated: Secondary | ICD-10-CM | POA: Diagnosis not present

## 2015-11-03 DIAGNOSIS — K219 Gastro-esophageal reflux disease without esophagitis: Secondary | ICD-10-CM | POA: Diagnosis not present

## 2015-11-03 DIAGNOSIS — I10 Essential (primary) hypertension: Secondary | ICD-10-CM | POA: Diagnosis not present

## 2015-11-03 DIAGNOSIS — R51 Headache: Secondary | ICD-10-CM | POA: Diagnosis not present

## 2015-11-09 DIAGNOSIS — J45909 Unspecified asthma, uncomplicated: Secondary | ICD-10-CM | POA: Diagnosis not present

## 2015-11-09 DIAGNOSIS — I4891 Unspecified atrial fibrillation: Secondary | ICD-10-CM | POA: Diagnosis not present

## 2015-11-09 DIAGNOSIS — T8189XD Other complications of procedures, not elsewhere classified, subsequent encounter: Secondary | ICD-10-CM | POA: Diagnosis not present

## 2015-11-09 DIAGNOSIS — I2510 Atherosclerotic heart disease of native coronary artery without angina pectoris: Secondary | ICD-10-CM | POA: Diagnosis not present

## 2015-11-09 DIAGNOSIS — I10 Essential (primary) hypertension: Secondary | ICD-10-CM | POA: Diagnosis not present

## 2015-12-23 DIAGNOSIS — Z48815 Encounter for surgical aftercare following surgery on the digestive system: Secondary | ICD-10-CM | POA: Diagnosis not present

## 2015-12-23 DIAGNOSIS — Z8719 Personal history of other diseases of the digestive system: Secondary | ICD-10-CM | POA: Diagnosis not present

## 2015-12-23 DIAGNOSIS — Z9889 Other specified postprocedural states: Secondary | ICD-10-CM | POA: Diagnosis not present

## 2015-12-31 ENCOUNTER — Telehealth: Payer: Self-pay | Admitting: Hematology and Oncology
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
## 2016-01-03 ENCOUNTER — Other Ambulatory Visit: Payer: Self-pay | Admitting: *Deleted

## 2016-01-03 DIAGNOSIS — D0510 Intraductal carcinoma in situ of unspecified breast: Secondary | ICD-10-CM

## 2016-01-03 DIAGNOSIS — C50511 Malignant neoplasm of lower-outer quadrant of right female breast: Secondary | ICD-10-CM | POA: Insufficient documentation

## 2016-01-03 NOTE — Telephone Encounter (Signed)
Who is diane? I can see the patient whenever she wishes to see me. Please arrange mamms and F/U

## 2016-01-04 NOTE — Telephone Encounter (Signed)
pof sent on 2/6

## 2016-01-05 ENCOUNTER — Telehealth: Payer: Self-pay | Admitting: Hematology and Oncology

## 2016-01-05 NOTE — Telephone Encounter (Signed)
Spoke to patient to confirm date/time for appointments made per 2/6 pof

## 2016-02-04 DIAGNOSIS — J309 Allergic rhinitis, unspecified: Secondary | ICD-10-CM | POA: Diagnosis not present

## 2016-02-04 DIAGNOSIS — H9313 Tinnitus, bilateral: Secondary | ICD-10-CM | POA: Diagnosis not present

## 2016-02-04 DIAGNOSIS — I10 Essential (primary) hypertension: Secondary | ICD-10-CM | POA: Diagnosis not present

## 2016-02-08 ENCOUNTER — Other Ambulatory Visit: Payer: Self-pay | Admitting: Hematology and Oncology

## 2016-02-08 ENCOUNTER — Ambulatory Visit
Admission: RE | Admit: 2016-02-08 | Discharge: 2016-02-08 | Disposition: A | Discharge status: Home / Self Care | Payer: Medicare Other | Source: Ambulatory Visit | Attending: Hematology and Oncology | Admitting: Hematology and Oncology

## 2016-02-08 DIAGNOSIS — D0510 Intraductal carcinoma in situ of unspecified breast: Secondary | ICD-10-CM

## 2016-02-08 DIAGNOSIS — Z853 Personal history of malignant neoplasm of breast: Secondary | ICD-10-CM

## 2016-02-08 DIAGNOSIS — N644 Mastodynia: Secondary | ICD-10-CM | POA: Diagnosis not present

## 2016-02-23 ENCOUNTER — Other Ambulatory Visit: Payer: Self-pay

## 2016-02-23 DIAGNOSIS — D0510 Intraductal carcinoma in situ of unspecified breast: Secondary | ICD-10-CM

## 2016-02-24 ENCOUNTER — Telehealth: Payer: Self-pay | Admitting: Hematology and Oncology

## 2016-02-24 ENCOUNTER — Encounter: Payer: Self-pay | Admitting: Hematology and Oncology

## 2016-02-24 ENCOUNTER — Other Ambulatory Visit (HOSPITAL_BASED_OUTPATIENT_CLINIC_OR_DEPARTMENT_OTHER): Payer: Medicare Other

## 2016-02-24 ENCOUNTER — Ambulatory Visit (HOSPITAL_BASED_OUTPATIENT_CLINIC_OR_DEPARTMENT_OTHER): Payer: Medicare Other | Admitting: Hematology and Oncology

## 2016-02-24 VITALS — BP 125/53 | HR 86 | Temp 97.7°F | Resp 17 | Wt 200.1 lb

## 2016-02-24 DIAGNOSIS — D0511 Intraductal carcinoma in situ of right breast: Secondary | ICD-10-CM

## 2016-02-24 DIAGNOSIS — C50511 Malignant neoplasm of lower-outer quadrant of right female breast: Secondary | ICD-10-CM

## 2016-02-24 DIAGNOSIS — N644 Mastodynia: Secondary | ICD-10-CM

## 2016-02-24 DIAGNOSIS — D0510 Intraductal carcinoma in situ of unspecified breast: Secondary | ICD-10-CM

## 2016-02-24 LAB — CBC WITH DIFFERENTIAL/PLATELET
BASO%: 0.4 % (ref 0.0–2.0)
Basophils Absolute: 0 10*3/uL (ref 0.0–0.1)
EOS%: 3.8 % (ref 0.0–7.0)
Eosinophils Absolute: 0.2 10*3/uL (ref 0.0–0.5)
HEMATOCRIT: 37.3 % (ref 34.8–46.6)
HGB: 12.4 g/dL (ref 11.6–15.9)
LYMPH#: 1.6 10*3/uL (ref 0.9–3.3)
LYMPH%: 24.8 % (ref 14.0–49.7)
MCH: 32 pg (ref 25.1–34.0)
MCHC: 33.2 g/dL (ref 31.5–36.0)
MCV: 96.3 fL (ref 79.5–101.0)
MONO#: 0.5 10*3/uL (ref 0.1–0.9)
MONO%: 7.3 % (ref 0.0–14.0)
NEUT%: 63.7 % (ref 38.4–76.8)
NEUTROS ABS: 4.2 10*3/uL (ref 1.5–6.5)
PLATELETS: 202 10*3/uL (ref 145–400)
RBC: 3.88 10*6/uL (ref 3.70–5.45)
RDW: 13.4 % (ref 11.2–14.5)
WBC: 6.6 10*3/uL (ref 3.9–10.3)

## 2016-02-24 LAB — COMPREHENSIVE METABOLIC PANEL
ALT: 9 U/L (ref 0–55)
ANION GAP: 7 meq/L (ref 3–11)
AST: 18 U/L (ref 5–34)
Albumin: 3.4 g/dL — ABNORMAL LOW (ref 3.5–5.0)
Alkaline Phosphatase: 149 U/L (ref 40–150)
BILIRUBIN TOTAL: 0.34 mg/dL (ref 0.20–1.20)
BUN: 25 mg/dL (ref 7.0–26.0)
CHLORIDE: 107 meq/L (ref 98–109)
CO2: 28 mEq/L (ref 22–29)
CREATININE: 1.1 mg/dL (ref 0.6–1.1)
Calcium: 9.2 mg/dL (ref 8.4–10.4)
EGFR: 51 mL/min/{1.73_m2} — ABNORMAL LOW (ref >90)
Glucose: 73 mg/dl (ref 70–140)
Potassium: 5 mEq/L (ref 3.5–5.1)
Sodium: 142 mEq/L (ref 136–145)
TOTAL PROTEIN: 7.4 g/dL (ref 6.4–8.3)

## 2016-02-24 MED ORDER — METOPROLOL SUCCINATE ER 25 MG PO TB24: 25.0000 mg | ORAL_TABLET | Freq: Every day | ORAL | Status: DC

## 2016-02-24 NOTE — Progress Notes (Signed)
Unable to get in to exam room prior to MD.  No assessment performed.  

## 2016-02-24 NOTE — Assessment & Plan Note (Signed)
Right lumpectomy 01/27/2011: DCIS arising out of the papilloma, 0.3 cm, ER 99%, PR 70%, Tis N0 stage 0, reexcision margins 02/17/2011 negative diagnosed and treated by [place] She was not prescribed radiation or antiestrogen therapy because of her elderly age  Right breast pain: No palpable lumps or nodules. Mammogram done on 02/01/2016 did not reveal any abnormalities. I discussed with her that it could be related to scar tissue. It could also be due to the fact that she does not support her breasts with any bra. I instructed her that it would be better to wear a supportive bra to help with the pain.  Return to clinic in 1 year for follow-up.

## 2016-02-24 NOTE — Telephone Encounter (Signed)
appt made and avs printed °

## 2016-02-24 NOTE — Progress Notes (Signed)
Patient Care Team: Josetta Huddle, MD as PCP - General (Internal Medicine)  DIAGNOSIS: Right breast DCIS  SUMMARY OF ONCOLOGIC HISTORY:   Breast cancer of lower-outer quadrant of right female breast ([place])   01/27/2011 Initial Diagnosis Right lumpectomy: DCIS arising out of the papilloma, 0.3 cm, ER 99%, PR 70%, Tis N0 stage 0, reexcision margins 02/17/2011 negative diagnosed and treated by [place] COMPLIANT:  Follow-up of DCIS complaining of breast pain  INTERVAL HISTORY: Jill Bowman is a 82 year old with above-mentioned history of DCIS treated with lumpectomy in [place] she was followed by Dr. Humphrey Rolls up until 3 years ago and she started coming. She was concerned with breast pain and she wanted to be seen by as for follow-up. She underwent a mammogram and is here accompanied by her daughter who is also her patient to discuss the results and to do a physical exam of the breasts.  REVIEW OF SYSTEMS:   Constitutional: Denies fevers, chills or abnormal weight loss, chronic fatigue Eyes: Denies blurriness of vision Ears, nose, mouth, throat, and face: Denies mucositis or sore throat Respiratory: Denies cough, dyspnea or wheezes Cardiovascular: Denies palpitation, chest discomfort Gastrointestinal:  Denies nausea, heartburn or change in bowel habits Skin: Denies abnormal skin rashes Lymphatics: Denies new lymphadenopathy or easy bruising Neurological:generalized weakness uses a walker to get around Behavioral/Psych: advanced Alzheimer's disease  Extremities: No lower extremity edema Breast: intermittent breast pain All other systems were reviewed with the patient and are negative.  I have reviewed the past medical history, past surgical history, social history and family history with the patient and they are unchanged from previous note.  ALLERGIES:  is allergic to codeine; hydroxyzine; lorazepam; penicillins; sulfa antibiotics; zinc; ciprofloxacin; ciprofloxacin;  latex; penicillins; and sulfa antibiotics.  MEDICATIONS:  Current Outpatient Prescriptions  Medication Sig Dispense Refill  . acidophilus (RISAQUAD) CAPS capsule Take 1 capsule by mouth daily. 30 capsule 0  . amiodarone (PACERONE) 200 MG tablet Take 100 mg by mouth daily.    Marland Kitchen aspirin 81 MG tablet Take 81 mg by mouth daily.    Marland Kitchen atorvastatin (LIPITOR) 10 MG tablet Take 10 mg by mouth daily.    . calcium carbonate (OS-CAL) 600 MG TABS tablet Take 600 mg by mouth daily with breakfast.    . Cholecalciferol (VITAMIN D) 2000 UNITS CAPS Take 2,000 Units by mouth daily.    Marland Kitchen docusate sodium 100 MG CAPS Take 100 mg by mouth 2 (two) times daily. (Patient taking differently: Take 100 mg by mouth daily. ) 10 capsule   . gabapentin (NEURONTIN) 600 MG tablet Take 300 mg by mouth at bedtime.    . hydrOXYzine (ATARAX/VISTARIL) 10 MG tablet Take 10 mg by mouth at bedtime as needed.    . Lactobacillus (ACIDOPHILUS) TABS Take 1 tablet by mouth daily.    Marland Kitchen loratadine (CLARITIN) 10 MG tablet Take 10 mg by mouth at bedtime.     . metoprolol succinate (TOPROL-XL) 25 MG 24 hr tablet Take 1 tablet (25 mg total) by mouth daily.    . Multiple Vitamin (MULTIVITAMIN WITH MINERALS) TABS Take 1 tablet by mouth daily.    . nitroGLYCERIN (NITROSTAT) 0.4 MG SL tablet Place 0.4 mg under the tongue every 5 (five) minutes as needed for chest pain.    . pantoprazole (PROTONIX) 40 MG tablet Take 1 tablet (40 mg total) by mouth daily at 12 noon. (Patient taking differently: Take 40 mg by mouth daily before breakfast. )    .  sertraline (ZOLOFT) 50 MG tablet Take 50 mg by mouth daily.     No current facility-administered medications for this visit.    PHYSICAL EXAMINATION: ECOG PERFORMANCE STATUS: 2 - Symptomatic, <50% confined to bed  Filed Vitals:   02/24/16 1005  BP: 125/53  Pulse: 86  Temp: 97.7 F (36.5 C)  Resp: 17   Filed Weights   02/24/16 1005  Weight: 200 lb 1.6 oz (90.765 kg)    GENERAL:alert, no distress  and comfortable SKIN: skin color, texture, turgor are normal, no rashes or significant lesions EYES: normal, Conjunctiva are pink and non-injected, sclera clear OROPHARYNX:no exudate, no erythema and lips, buccal mucosa, and tongue normal  NECK: supple, thyroid normal size, non-tender, without nodularity LYMPH:  no palpable lymphadenopathy in the cervical, axillary or inguinal LUNGS: clear to auscultation and percussion with normal breathing effort HEART: regular rate & rhythm and no murmurs and no lower extremity edema ABDOMEN:abdomen soft, non-tender and normal bowel sounds MUSCULOSKELETAL:no cyanosis of digits and no clubbing  NEURO: lower extremity weakness, Alzheimer's disease EXTREMITIES: No lower extremity edema BREAST: No palpable masses or nodules in either right or left breasts. No palpable axillary supraclavicular or infraclavicular adenopathy no breast tenderness or nipple discharge. (exam performed in the presence of a chaperone)  LABORATORY DATA:  I have reviewed the data as listed   Chemistry      Component Value Date/Time   NA 142 02/24/2016 0944   NA 143 03/29/2015 0532   K 5.0 02/24/2016 0944   K 3.6 03/29/2015 0532   CL 114* 03/29/2015 0532   CO2 28 02/24/2016 0944   CO2 23 03/29/2015 0532   BUN 25.0 02/24/2016 0944   BUN <5* 03/29/2015 0532   CREATININE 1.1 02/24/2016 0944   CREATININE 0.75 03/29/2015 0532      Component Value Date/Time   CALCIUM 9.2 02/24/2016 0944   CALCIUM 8.1* 03/29/2015 0532   CALCIUM 8.2* 09/11/2012 0555   ALKPHOS 149 02/24/2016 0944   ALKPHOS 92 03/25/2015 0549   AST 18 02/24/2016 0944   AST 22 03/25/2015 0549   ALT 9 02/24/2016 0944   ALT 14 03/25/2015 0549   BILITOT 0.34 02/24/2016 0944   BILITOT 0.7 03/25/2015 0549       Lab Results  Component Value Date   WBC 6.6 02/24/2016   HGB 12.4 02/24/2016   HCT 37.3 02/24/2016   MCV 96.3 02/24/2016   PLT 202 02/24/2016   NEUTROABS 4.2 02/24/2016   ASSESSMENT & PLAN:    Breast cancer of lower-outer quadrant of right female breast ([place]) Right lumpectomy 01/27/2011: DCIS arising out of the papilloma, 0.3 cm, ER 99%, PR 70%, Tis N0 stage 0, reexcision margins 02/17/2011 negative diagnosed and treated by [place] She was not prescribed radiation or antiestrogen therapy because of her elderly age  Right breast pain: No palpable lumps or nodules. Mammogram done on 02/01/2016 did not reveal any abnormalities. I discussed with her that it could be related to scar tissue. It could also be due to the fact that she does not support her breasts with any bra. I instructed her that it would be better to wear a supportive bra to help with the pain.  Return to clinic in 1 year for follow-up.   No orders of the defined types were placed in this encounter.   The patient has a good understanding of the overall plan. she agrees with it. she will call with any problems that may develop before the next visit here.  Rulon Eisenmenger, MD 02/24/2016

## 2016-05-26 DIAGNOSIS — E782 Mixed hyperlipidemia: Secondary | ICD-10-CM | POA: Diagnosis not present

## 2016-05-26 DIAGNOSIS — N183 Chronic kidney disease, stage 3 (moderate): Secondary | ICD-10-CM | POA: Diagnosis not present

## 2016-05-26 DIAGNOSIS — I129 Hypertensive chronic kidney disease with stage 1 through stage 4 chronic kidney disease, or unspecified chronic kidney disease: Secondary | ICD-10-CM | POA: Diagnosis not present

## 2016-05-26 DIAGNOSIS — Z79899 Other long term (current) drug therapy: Secondary | ICD-10-CM | POA: Diagnosis not present

## 2016-05-26 DIAGNOSIS — Z950 Presence of cardiac pacemaker: Secondary | ICD-10-CM | POA: Diagnosis not present

## 2016-05-26 DIAGNOSIS — R413 Other amnesia: Secondary | ICD-10-CM | POA: Diagnosis not present

## 2016-05-26 DIAGNOSIS — I10 Essential (primary) hypertension: Secondary | ICD-10-CM | POA: Diagnosis not present

## 2016-05-26 DIAGNOSIS — Z0000 Encounter for general adult medical examination without abnormal findings: Secondary | ICD-10-CM | POA: Diagnosis not present

## 2016-05-26 DIAGNOSIS — I471 Supraventricular tachycardia: Secondary | ICD-10-CM | POA: Diagnosis not present

## 2016-06-21 DIAGNOSIS — Z950 Presence of cardiac pacemaker: Secondary | ICD-10-CM | POA: Diagnosis not present

## 2016-08-22 DIAGNOSIS — Z23 Encounter for immunization: Secondary | ICD-10-CM | POA: Diagnosis not present

## 2016-08-29 ENCOUNTER — Ambulatory Visit (INDEPENDENT_AMBULATORY_CARE_PROVIDER_SITE_OTHER): Payer: Medicare Other | Admitting: Orthopaedic Surgery

## 2016-08-29 DIAGNOSIS — M19072 Primary osteoarthritis, left ankle and foot: Secondary | ICD-10-CM | POA: Diagnosis not present

## 2016-08-29 DIAGNOSIS — M7662 Achilles tendinitis, left leg: Secondary | ICD-10-CM | POA: Diagnosis not present

## 2016-08-29 DIAGNOSIS — M65331 Trigger finger, right middle finger: Secondary | ICD-10-CM | POA: Diagnosis not present

## 2016-08-29 DIAGNOSIS — M19071 Primary osteoarthritis, right ankle and foot: Secondary | ICD-10-CM

## 2016-09-26 DIAGNOSIS — Z950 Presence of cardiac pacemaker: Secondary | ICD-10-CM | POA: Diagnosis not present

## 2016-09-26 DIAGNOSIS — I471 Supraventricular tachycardia: Secondary | ICD-10-CM | POA: Diagnosis not present

## 2016-09-26 DIAGNOSIS — I2510 Atherosclerotic heart disease of native coronary artery without angina pectoris: Secondary | ICD-10-CM | POA: Diagnosis not present

## 2016-09-28 DIAGNOSIS — Z950 Presence of cardiac pacemaker: Secondary | ICD-10-CM | POA: Diagnosis not present

## 2016-11-30 DIAGNOSIS — N183 Chronic kidney disease, stage 3 (moderate): Secondary | ICD-10-CM | POA: Diagnosis not present

## 2016-11-30 DIAGNOSIS — I129 Hypertensive chronic kidney disease with stage 1 through stage 4 chronic kidney disease, or unspecified chronic kidney disease: Secondary | ICD-10-CM | POA: Diagnosis not present

## 2016-11-30 DIAGNOSIS — E782 Mixed hyperlipidemia: Secondary | ICD-10-CM | POA: Diagnosis not present

## 2016-11-30 DIAGNOSIS — R413 Other amnesia: Secondary | ICD-10-CM | POA: Diagnosis not present

## 2016-12-04 DIAGNOSIS — M25562 Pain in left knee: Secondary | ICD-10-CM | POA: Diagnosis not present

## 2016-12-04 DIAGNOSIS — M25571 Pain in right ankle and joints of right foot: Secondary | ICD-10-CM | POA: Diagnosis not present

## 2016-12-04 DIAGNOSIS — M25561 Pain in right knee: Secondary | ICD-10-CM | POA: Diagnosis not present

## 2016-12-06 DIAGNOSIS — N183 Chronic kidney disease, stage 3 (moderate): Secondary | ICD-10-CM | POA: Diagnosis not present

## 2016-12-06 DIAGNOSIS — I129 Hypertensive chronic kidney disease with stage 1 through stage 4 chronic kidney disease, or unspecified chronic kidney disease: Secondary | ICD-10-CM | POA: Diagnosis not present

## 2016-12-06 DIAGNOSIS — J069 Acute upper respiratory infection, unspecified: Secondary | ICD-10-CM | POA: Diagnosis not present

## 2017-02-19 DIAGNOSIS — I10 Essential (primary) hypertension: Secondary | ICD-10-CM | POA: Diagnosis not present

## 2017-02-19 DIAGNOSIS — E782 Mixed hyperlipidemia: Secondary | ICD-10-CM | POA: Diagnosis not present

## 2017-02-19 DIAGNOSIS — R413 Other amnesia: Secondary | ICD-10-CM | POA: Diagnosis not present

## 2017-02-19 DIAGNOSIS — R7309 Other abnormal glucose: Secondary | ICD-10-CM | POA: Diagnosis not present

## 2017-02-19 DIAGNOSIS — Z950 Presence of cardiac pacemaker: Secondary | ICD-10-CM | POA: Diagnosis not present

## 2017-02-19 DIAGNOSIS — I129 Hypertensive chronic kidney disease with stage 1 through stage 4 chronic kidney disease, or unspecified chronic kidney disease: Secondary | ICD-10-CM | POA: Diagnosis not present

## 2017-02-19 DIAGNOSIS — N183 Chronic kidney disease, stage 3 (moderate): Secondary | ICD-10-CM | POA: Diagnosis not present

## 2017-02-23 ENCOUNTER — Ambulatory Visit: Payer: Medicare Other | Admitting: Hematology and Oncology

## 2017-04-16 ENCOUNTER — Ambulatory Visit (INDEPENDENT_AMBULATORY_CARE_PROVIDER_SITE_OTHER): Payer: Medicare Other | Admitting: Orthopaedic Surgery

## 2017-05-10 DIAGNOSIS — R05 Cough: Secondary | ICD-10-CM | POA: Diagnosis not present

## 2017-05-10 DIAGNOSIS — J069 Acute upper respiratory infection, unspecified: Secondary | ICD-10-CM | POA: Diagnosis not present

## 2017-06-08 DIAGNOSIS — Z950 Presence of cardiac pacemaker: Secondary | ICD-10-CM | POA: Diagnosis not present

## 2017-06-15 ENCOUNTER — Other Ambulatory Visit: Payer: Self-pay

## 2017-08-03 DIAGNOSIS — E782 Mixed hyperlipidemia: Secondary | ICD-10-CM | POA: Diagnosis not present

## 2017-08-03 DIAGNOSIS — Z950 Presence of cardiac pacemaker: Secondary | ICD-10-CM | POA: Diagnosis not present

## 2017-08-03 DIAGNOSIS — R7309 Other abnormal glucose: Secondary | ICD-10-CM | POA: Diagnosis not present

## 2017-08-03 DIAGNOSIS — Z0000 Encounter for general adult medical examination without abnormal findings: Secondary | ICD-10-CM | POA: Diagnosis not present

## 2017-08-03 DIAGNOSIS — N183 Chronic kidney disease, stage 3 (moderate): Secondary | ICD-10-CM | POA: Diagnosis not present

## 2017-08-03 DIAGNOSIS — I10 Essential (primary) hypertension: Secondary | ICD-10-CM | POA: Diagnosis not present

## 2017-08-03 DIAGNOSIS — R413 Other amnesia: Secondary | ICD-10-CM | POA: Diagnosis not present

## 2017-08-03 DIAGNOSIS — I129 Hypertensive chronic kidney disease with stage 1 through stage 4 chronic kidney disease, or unspecified chronic kidney disease: Secondary | ICD-10-CM | POA: Diagnosis not present

## 2017-08-03 DIAGNOSIS — E559 Vitamin D deficiency, unspecified: Secondary | ICD-10-CM | POA: Diagnosis not present

## 2017-08-30 DIAGNOSIS — H40023 Open angle with borderline findings, high risk, bilateral: Secondary | ICD-10-CM | POA: Diagnosis not present

## 2017-08-30 DIAGNOSIS — H2513 Age-related nuclear cataract, bilateral: Secondary | ICD-10-CM | POA: Diagnosis not present

## 2017-09-04 DIAGNOSIS — Z23 Encounter for immunization: Secondary | ICD-10-CM | POA: Diagnosis not present

## 2017-10-05 DIAGNOSIS — I10 Essential (primary) hypertension: Secondary | ICD-10-CM | POA: Diagnosis not present

## 2017-10-05 DIAGNOSIS — I4891 Unspecified atrial fibrillation: Secondary | ICD-10-CM | POA: Diagnosis not present

## 2017-10-05 DIAGNOSIS — E7800 Pure hypercholesterolemia, unspecified: Secondary | ICD-10-CM | POA: Diagnosis not present

## 2017-10-05 DIAGNOSIS — I2510 Atherosclerotic heart disease of native coronary artery without angina pectoris: Secondary | ICD-10-CM | POA: Diagnosis not present

## 2017-12-10 DIAGNOSIS — Z45018 Encounter for adjustment and management of other part of cardiac pacemaker: Secondary | ICD-10-CM | POA: Diagnosis not present

## 2017-12-10 DIAGNOSIS — I495 Sick sinus syndrome: Secondary | ICD-10-CM | POA: Diagnosis not present

## 2017-12-10 DIAGNOSIS — Z950 Presence of cardiac pacemaker: Secondary | ICD-10-CM | POA: Diagnosis not present

## 2017-12-10 DIAGNOSIS — I2510 Atherosclerotic heart disease of native coronary artery without angina pectoris: Secondary | ICD-10-CM | POA: Diagnosis not present

## 2018-01-16 DIAGNOSIS — N183 Chronic kidney disease, stage 3 (moderate): Secondary | ICD-10-CM | POA: Diagnosis not present

## 2018-01-16 DIAGNOSIS — E782 Mixed hyperlipidemia: Secondary | ICD-10-CM | POA: Diagnosis not present

## 2018-01-16 DIAGNOSIS — I129 Hypertensive chronic kidney disease with stage 1 through stage 4 chronic kidney disease, or unspecified chronic kidney disease: Secondary | ICD-10-CM | POA: Diagnosis not present

## 2018-01-16 DIAGNOSIS — R7309 Other abnormal glucose: Secondary | ICD-10-CM | POA: Diagnosis not present

## 2018-01-16 DIAGNOSIS — T8130XS Disruption of wound, unspecified, sequela: Secondary | ICD-10-CM | POA: Diagnosis not present

## 2018-01-16 DIAGNOSIS — I10 Essential (primary) hypertension: Secondary | ICD-10-CM | POA: Diagnosis not present

## 2018-01-17 ENCOUNTER — Encounter (HOSPITAL_COMMUNITY): Payer: Self-pay | Admitting: Emergency Medicine

## 2018-01-17 ENCOUNTER — Other Ambulatory Visit: Payer: Self-pay

## 2018-01-17 ENCOUNTER — Inpatient Hospital Stay (HOSPITAL_COMMUNITY)
Admission: EM | Admit: 2018-01-17 | Discharge: 2018-01-19 | DRG: 880 | Disposition: A | Discharge status: Home Health | Payer: Medicare Other | Attending: Internal Medicine | Admitting: Internal Medicine

## 2018-01-17 ENCOUNTER — Emergency Department (HOSPITAL_COMMUNITY): Payer: Medicare Other

## 2018-01-17 DIAGNOSIS — E119 Type 2 diabetes mellitus without complications: Secondary | ICD-10-CM | POA: Diagnosis present

## 2018-01-17 DIAGNOSIS — F445 Conversion disorder with seizures or convulsions: Principal | ICD-10-CM | POA: Diagnosis present

## 2018-01-17 DIAGNOSIS — Z880 Allergy status to penicillin: Secondary | ICD-10-CM

## 2018-01-17 DIAGNOSIS — F329 Major depressive disorder, single episode, unspecified: Secondary | ICD-10-CM | POA: Diagnosis present

## 2018-01-17 DIAGNOSIS — Z79899 Other long term (current) drug therapy: Secondary | ICD-10-CM

## 2018-01-17 DIAGNOSIS — E7800 Pure hypercholesterolemia, unspecified: Secondary | ICD-10-CM | POA: Diagnosis present

## 2018-01-17 DIAGNOSIS — I471 Supraventricular tachycardia: Secondary | ICD-10-CM | POA: Diagnosis present

## 2018-01-17 DIAGNOSIS — I480 Paroxysmal atrial fibrillation: Secondary | ICD-10-CM | POA: Diagnosis not present

## 2018-01-17 DIAGNOSIS — L0291 Cutaneous abscess, unspecified: Secondary | ICD-10-CM | POA: Diagnosis not present

## 2018-01-17 DIAGNOSIS — Z955 Presence of coronary angioplasty implant and graft: Secondary | ICD-10-CM

## 2018-01-17 DIAGNOSIS — R55 Syncope and collapse: Secondary | ICD-10-CM | POA: Diagnosis not present

## 2018-01-17 DIAGNOSIS — E785 Hyperlipidemia, unspecified: Secondary | ICD-10-CM | POA: Diagnosis present

## 2018-01-17 DIAGNOSIS — I495 Sick sinus syndrome: Secondary | ICD-10-CM | POA: Diagnosis present

## 2018-01-17 DIAGNOSIS — Z7982 Long term (current) use of aspirin: Secondary | ICD-10-CM

## 2018-01-17 DIAGNOSIS — Z882 Allergy status to sulfonamides status: Secondary | ICD-10-CM

## 2018-01-17 DIAGNOSIS — F0390 Unspecified dementia without behavioral disturbance: Secondary | ICD-10-CM | POA: Diagnosis present

## 2018-01-17 DIAGNOSIS — Z881 Allergy status to other antibiotic agents status: Secondary | ICD-10-CM

## 2018-01-17 DIAGNOSIS — K219 Gastro-esophageal reflux disease without esophagitis: Secondary | ICD-10-CM | POA: Diagnosis present

## 2018-01-17 DIAGNOSIS — Z885 Allergy status to narcotic agent status: Secondary | ICD-10-CM

## 2018-01-17 DIAGNOSIS — R404 Transient alteration of awareness: Secondary | ICD-10-CM | POA: Diagnosis not present

## 2018-01-17 DIAGNOSIS — Z4509 Encounter for adjustment and management of other cardiac device: Secondary | ICD-10-CM | POA: Diagnosis not present

## 2018-01-17 DIAGNOSIS — I10 Essential (primary) hypertension: Secondary | ICD-10-CM | POA: Diagnosis present

## 2018-01-17 DIAGNOSIS — J45909 Unspecified asthma, uncomplicated: Secondary | ICD-10-CM | POA: Diagnosis present

## 2018-01-17 DIAGNOSIS — Z888 Allergy status to other drugs, medicaments and biological substances status: Secondary | ICD-10-CM

## 2018-01-17 DIAGNOSIS — Z6833 Body mass index (BMI) 33.0-33.9, adult: Secondary | ICD-10-CM

## 2018-01-17 DIAGNOSIS — I4719 Other supraventricular tachycardia: Secondary | ICD-10-CM

## 2018-01-17 DIAGNOSIS — N611 Abscess of the breast and nipple: Secondary | ICD-10-CM | POA: Diagnosis present

## 2018-01-17 DIAGNOSIS — Z87891 Personal history of nicotine dependence: Secondary | ICD-10-CM

## 2018-01-17 DIAGNOSIS — Z950 Presence of cardiac pacemaker: Secondary | ICD-10-CM

## 2018-01-17 DIAGNOSIS — I2510 Atherosclerotic heart disease of native coronary artery without angina pectoris: Secondary | ICD-10-CM | POA: Diagnosis present

## 2018-01-17 DIAGNOSIS — F0280 Dementia in other diseases classified elsewhere without behavioral disturbance: Secondary | ICD-10-CM

## 2018-01-17 DIAGNOSIS — Z91040 Latex allergy status: Secondary | ICD-10-CM

## 2018-01-17 DIAGNOSIS — G301 Alzheimer's disease with late onset: Secondary | ICD-10-CM

## 2018-01-17 DIAGNOSIS — F32A Depression, unspecified: Secondary | ICD-10-CM | POA: Diagnosis present

## 2018-01-17 HISTORY — DX: Other supraventricular tachycardia: I47.19

## 2018-01-17 HISTORY — DX: Supraventricular tachycardia: I47.1

## 2018-01-17 LAB — I-STAT CHEM 8, ED
BUN: 21 mg/dL — ABNORMAL HIGH (ref 6–20)
CREATININE: 1 mg/dL (ref 0.44–1.00)
Calcium, Ion: 1.08 mmol/L — ABNORMAL LOW (ref 1.15–1.40)
Chloride: 104 mmol/L (ref 101–111)
Glucose, Bld: 107 mg/dL — ABNORMAL HIGH (ref 65–99)
HEMATOCRIT: 38 % (ref 36.0–46.0)
Hemoglobin: 12.9 g/dL (ref 12.0–15.0)
Potassium: 3.8 mmol/L (ref 3.5–5.1)
SODIUM: 141 mmol/L (ref 135–145)
TCO2: 26 mmol/L (ref 22–32)

## 2018-01-17 LAB — CBC WITH DIFFERENTIAL/PLATELET
Basophils Absolute: 0 10*3/uL (ref 0.0–0.1)
Basophils Relative: 0 %
Eosinophils Absolute: 0.2 10*3/uL (ref 0.0–0.7)
Eosinophils Relative: 2 %
HCT: 38 % (ref 36.0–46.0)
Hemoglobin: 12.5 g/dL (ref 12.0–15.0)
Lymphocytes Relative: 17 %
Lymphs Abs: 1.6 10*3/uL (ref 0.7–4.0)
MCH: 32.1 pg (ref 26.0–34.0)
MCHC: 32.9 g/dL (ref 30.0–36.0)
MCV: 97.4 fL (ref 78.0–100.0)
MONO ABS: 0.4 10*3/uL (ref 0.1–1.0)
Monocytes Relative: 4 %
NEUTROS ABS: 6.9 10*3/uL (ref 1.7–7.7)
Neutrophils Relative %: 77 %
Platelets: 248 10*3/uL (ref 150–400)
RBC: 3.9 MIL/uL (ref 3.87–5.11)
RDW: 14.1 % (ref 11.5–15.5)
WBC: 9.1 10*3/uL (ref 4.0–10.5)

## 2018-01-17 LAB — COMPREHENSIVE METABOLIC PANEL
ALK PHOS: 122 U/L (ref 38–126)
ALT: 13 U/L — AB (ref 14–54)
AST: 27 U/L (ref 15–41)
Albumin: 3.5 g/dL (ref 3.5–5.0)
Anion gap: 10 (ref 5–15)
BILIRUBIN TOTAL: 0.3 mg/dL (ref 0.3–1.2)
BUN: 19 mg/dL (ref 6–20)
CALCIUM: 8.7 mg/dL — AB (ref 8.9–10.3)
CO2: 23 mmol/L (ref 22–32)
Chloride: 104 mmol/L (ref 101–111)
Creatinine, Ser: 1.04 mg/dL — ABNORMAL HIGH (ref 0.44–1.00)
GFR calc non Af Amer: 47 mL/min — ABNORMAL LOW (ref >60)
GFR, EST AFRICAN AMERICAN: 55 mL/min — AB (ref >60)
Glucose, Bld: 109 mg/dL — ABNORMAL HIGH (ref 65–99)
Potassium: 3.8 mmol/L (ref 3.5–5.1)
Sodium: 137 mmol/L (ref 135–145)
TOTAL PROTEIN: 7.2 g/dL (ref 6.5–8.1)

## 2018-01-17 LAB — CBG MONITORING, ED: GLUCOSE-CAPILLARY: 81 mg/dL (ref 65–99)

## 2018-01-17 LAB — I-STAT TROPONIN, ED: Troponin i, poc: 0 ng/mL (ref 0.00–0.08)

## 2018-01-17 LAB — BRAIN NATRIURETIC PEPTIDE: B Natriuretic Peptide: 84.4 pg/mL (ref 0.0–100.0)

## 2018-01-17 NOTE — ED Notes (Signed)
ED Provider at bedside. 
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
## 2018-01-17 NOTE — ED Notes (Signed)
Patient transported to X-ray 

## 2018-01-17 NOTE — ED Triage Notes (Signed)
Per EMS, pt coming from home with a 1 minute syncopal episode while playing a video game on her iPad in her recliner. Pt daughter stated that she started shaking and was diaphoretic. There was no fall. Pt is comp;aing of her heart racing with vague left sided chest pain. Pt was at doctors office yesterday for an abscess under her right breast and was started on antibiotics.
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
## 2018-01-18 ENCOUNTER — Observation Stay (HOSPITAL_COMMUNITY): Payer: Medicare Other

## 2018-01-18 ENCOUNTER — Observation Stay (HOSPITAL_BASED_OUTPATIENT_CLINIC_OR_DEPARTMENT_OTHER)
Admit: 2018-01-18 | Discharge: 2018-01-18 | Disposition: A | Discharge status: Home / Self Care | Payer: Medicare Other | Attending: Internal Medicine | Admitting: Internal Medicine

## 2018-01-18 ENCOUNTER — Encounter (HOSPITAL_COMMUNITY): Payer: Self-pay | Admitting: Internal Medicine

## 2018-01-18 DIAGNOSIS — I2510 Atherosclerotic heart disease of native coronary artery without angina pectoris: Secondary | ICD-10-CM | POA: Diagnosis present

## 2018-01-18 DIAGNOSIS — K219 Gastro-esophageal reflux disease without esophagitis: Secondary | ICD-10-CM | POA: Diagnosis present

## 2018-01-18 DIAGNOSIS — J45909 Unspecified asthma, uncomplicated: Secondary | ICD-10-CM | POA: Diagnosis present

## 2018-01-18 DIAGNOSIS — Z87891 Personal history of nicotine dependence: Secondary | ICD-10-CM | POA: Diagnosis not present

## 2018-01-18 DIAGNOSIS — Z950 Presence of cardiac pacemaker: Secondary | ICD-10-CM | POA: Diagnosis not present

## 2018-01-18 DIAGNOSIS — Z955 Presence of coronary angioplasty implant and graft: Secondary | ICD-10-CM | POA: Diagnosis not present

## 2018-01-18 DIAGNOSIS — I10 Essential (primary) hypertension: Secondary | ICD-10-CM | POA: Diagnosis present

## 2018-01-18 DIAGNOSIS — Z888 Allergy status to other drugs, medicaments and biological substances status: Secondary | ICD-10-CM | POA: Diagnosis not present

## 2018-01-18 DIAGNOSIS — I495 Sick sinus syndrome: Secondary | ICD-10-CM | POA: Diagnosis not present

## 2018-01-18 DIAGNOSIS — Z91040 Latex allergy status: Secondary | ICD-10-CM | POA: Diagnosis not present

## 2018-01-18 DIAGNOSIS — Z7982 Long term (current) use of aspirin: Secondary | ICD-10-CM | POA: Diagnosis not present

## 2018-01-18 DIAGNOSIS — F0390 Unspecified dementia without behavioral disturbance: Secondary | ICD-10-CM | POA: Diagnosis present

## 2018-01-18 DIAGNOSIS — G301 Alzheimer's disease with late onset: Secondary | ICD-10-CM

## 2018-01-18 DIAGNOSIS — R55 Syncope and collapse: Secondary | ICD-10-CM | POA: Diagnosis not present

## 2018-01-18 DIAGNOSIS — N611 Abscess of the breast and nipple: Secondary | ICD-10-CM | POA: Diagnosis present

## 2018-01-18 DIAGNOSIS — Z882 Allergy status to sulfonamides status: Secondary | ICD-10-CM | POA: Diagnosis not present

## 2018-01-18 DIAGNOSIS — E119 Type 2 diabetes mellitus without complications: Secondary | ICD-10-CM | POA: Diagnosis present

## 2018-01-18 DIAGNOSIS — Z885 Allergy status to narcotic agent status: Secondary | ICD-10-CM | POA: Diagnosis not present

## 2018-01-18 DIAGNOSIS — F0280 Dementia in other diseases classified elsewhere without behavioral disturbance: Secondary | ICD-10-CM

## 2018-01-18 DIAGNOSIS — F445 Conversion disorder with seizures or convulsions: Secondary | ICD-10-CM | POA: Diagnosis not present

## 2018-01-18 DIAGNOSIS — Z79899 Other long term (current) drug therapy: Secondary | ICD-10-CM | POA: Diagnosis not present

## 2018-01-18 DIAGNOSIS — F329 Major depressive disorder, single episode, unspecified: Secondary | ICD-10-CM | POA: Diagnosis present

## 2018-01-18 DIAGNOSIS — E785 Hyperlipidemia, unspecified: Secondary | ICD-10-CM | POA: Diagnosis not present

## 2018-01-18 DIAGNOSIS — Z880 Allergy status to penicillin: Secondary | ICD-10-CM | POA: Diagnosis not present

## 2018-01-18 DIAGNOSIS — I480 Paroxysmal atrial fibrillation: Secondary | ICD-10-CM | POA: Diagnosis present

## 2018-01-18 DIAGNOSIS — E7800 Pure hypercholesterolemia, unspecified: Secondary | ICD-10-CM | POA: Diagnosis present

## 2018-01-18 DIAGNOSIS — Z881 Allergy status to other antibiotic agents status: Secondary | ICD-10-CM | POA: Diagnosis not present

## 2018-01-18 LAB — CBC
HEMATOCRIT: 36.3 % (ref 36.0–46.0)
Hemoglobin: 11.8 g/dL — ABNORMAL LOW (ref 12.0–15.0)
MCH: 31.2 pg (ref 26.0–34.0)
MCHC: 32.5 g/dL (ref 30.0–36.0)
MCV: 96 fL (ref 78.0–100.0)
Platelets: 237 10*3/uL (ref 150–400)
RBC: 3.78 MIL/uL — ABNORMAL LOW (ref 3.87–5.11)
RDW: 13.6 % (ref 11.5–15.5)
WBC: 7.7 10*3/uL (ref 4.0–10.5)

## 2018-01-18 LAB — BASIC METABOLIC PANEL
Anion gap: 10 (ref 5–15)
BUN: 17 mg/dL (ref 6–20)
CALCIUM: 9 mg/dL (ref 8.9–10.3)
CO2: 25 mmol/L (ref 22–32)
Chloride: 106 mmol/L (ref 101–111)
Creatinine, Ser: 0.96 mg/dL (ref 0.44–1.00)
GFR calc Af Amer: >60 mL/min (ref >60)
GFR, EST NON AFRICAN AMERICAN: 52 mL/min — AB (ref >60)
GLUCOSE: 104 mg/dL — AB (ref 65–99)
Potassium: 4.1 mmol/L (ref 3.5–5.1)
SODIUM: 141 mmol/L (ref 135–145)

## 2018-01-18 LAB — URINALYSIS, ROUTINE W REFLEX MICROSCOPIC
BILIRUBIN URINE: NEGATIVE
Glucose, UA: NEGATIVE mg/dL
HGB URINE DIPSTICK: NEGATIVE
Ketones, ur: NEGATIVE mg/dL
Leukocytes, UA: NEGATIVE
Nitrite: NEGATIVE
Protein, ur: NEGATIVE mg/dL
SPECIFIC GRAVITY, URINE: 1.016 (ref 1.005–1.030)
pH: 7 (ref 5.0–8.0)

## 2018-01-18 LAB — ECHOCARDIOGRAM COMPLETE
HEIGHTINCHES: 66 in
WEIGHTICAEL: 3350.99 [oz_av]

## 2018-01-18 LAB — TROPONIN I

## 2018-01-18 LAB — MRSA PCR SCREENING: MRSA by PCR: NEGATIVE

## 2018-01-18 LAB — GLUCOSE, CAPILLARY: GLUCOSE-CAPILLARY: 113 mg/dL — AB (ref 65–99)

## 2018-01-18 MED ORDER — LORATADINE 10 MG PO TABS
10.0000 mg | ORAL_TABLET | Freq: Every day | ORAL | Status: DC
  Administered 2018-01-18 (×2): 10 mg via ORAL
  Filled 2018-01-18 (×2): qty 1

## 2018-01-18 MED ORDER — NYSTATIN 100000 UNIT/GM EX POWD
Freq: Three times a day (TID) | CUTANEOUS | Status: DC
  Administered 2018-01-18 – 2018-01-19 (×3): via TOPICAL
  Filled 2018-01-18: qty 15

## 2018-01-18 MED ORDER — NITROGLYCERIN 0.4 MG SL SUBL: 0.4000 mg | SUBLINGUAL_TABLET | SUBLINGUAL | Status: DC | PRN

## 2018-01-18 MED ORDER — SENNA 8.6 MG PO TABS
1.0000 | ORAL_TABLET | Freq: Two times a day (BID) | ORAL | Status: DC
  Administered 2018-01-18 – 2018-01-19 (×3): 8.6 mg via ORAL
  Filled 2018-01-18 (×3): qty 1

## 2018-01-18 MED ORDER — ONDANSETRON HCL 4 MG PO TABS: 4.0000 mg | ORAL_TABLET | Freq: Four times a day (QID) | ORAL | Status: DC | PRN

## 2018-01-18 MED ORDER — ASPIRIN 81 MG PO CHEW
81.0000 mg | CHEWABLE_TABLET | Freq: Every day | ORAL | Status: DC
  Administered 2018-01-18 – 2018-01-19 (×2): 81 mg via ORAL
  Filled 2018-01-18 (×2): qty 1

## 2018-01-18 MED ORDER — VITAMIN D 1000 UNITS PO TABS
2000.0000 [IU] | ORAL_TABLET | Freq: Every day | ORAL | Status: DC
  Administered 2018-01-18 – 2018-01-19 (×2): 2000 [IU] via ORAL
  Filled 2018-01-18 (×2): qty 2

## 2018-01-18 MED ORDER — GABAPENTIN 300 MG PO CAPS
300.0000 mg | ORAL_CAPSULE | Freq: Two times a day (BID) | ORAL | Status: DC
  Administered 2018-01-18 – 2018-01-19 (×4): 300 mg via ORAL
  Filled 2018-01-18 (×4): qty 1

## 2018-01-18 MED ORDER — METOPROLOL TARTRATE 25 MG PO TABS
25.0000 mg | ORAL_TABLET | Freq: Every day | ORAL | Status: DC
  Administered 2018-01-18 – 2018-01-19 (×2): 25 mg via ORAL
  Filled 2018-01-18 (×2): qty 1

## 2018-01-18 MED ORDER — AMIODARONE HCL 100 MG PO TABS
100.0000 mg | ORAL_TABLET | Freq: Every day | ORAL | Status: DC
  Administered 2018-01-18 – 2018-01-19 (×2): 100 mg via ORAL
  Filled 2018-01-18 (×2): qty 1

## 2018-01-18 MED ORDER — ACETAMINOPHEN 325 MG PO TABS
650.0000 mg | ORAL_TABLET | Freq: Four times a day (QID) | ORAL | Status: DC | PRN
  Administered 2018-01-19: 650 mg via ORAL
  Filled 2018-01-18: qty 2

## 2018-01-18 MED ORDER — DOXYCYCLINE HYCLATE 100 MG PO TABS
100.0000 mg | ORAL_TABLET | Freq: Two times a day (BID) | ORAL | Status: DC
  Administered 2018-01-18 – 2018-01-19 (×4): 100 mg via ORAL
  Filled 2018-01-18 (×4): qty 1

## 2018-01-18 MED ORDER — CYCLOBENZAPRINE HCL 10 MG PO TABS
10.0000 mg | ORAL_TABLET | Freq: Two times a day (BID) | ORAL | Status: DC
  Administered 2018-01-18 – 2018-01-19 (×4): 10 mg via ORAL
  Filled 2018-01-18 (×4): qty 1

## 2018-01-18 MED ORDER — ONDANSETRON HCL 4 MG/2ML IJ SOLN: 4.0000 mg | Freq: Four times a day (QID) | INTRAMUSCULAR | Status: DC | PRN

## 2018-01-18 MED ORDER — ZOLPIDEM TARTRATE 5 MG PO TABS
5.0000 mg | ORAL_TABLET | Freq: Every evening | ORAL | Status: DC | PRN
  Administered 2018-01-18: 5 mg via ORAL
  Filled 2018-01-18: qty 1

## 2018-01-18 MED ORDER — HYDRALAZINE HCL 20 MG/ML IJ SOLN: 5.0000 mg | INTRAMUSCULAR | Status: DC | PRN

## 2018-01-18 MED ORDER — SODIUM CHLORIDE 0.9% FLUSH
3.0000 mL | Freq: Two times a day (BID) | INTRAVENOUS | Status: DC
  Administered 2018-01-18 – 2018-01-19 (×3): 3 mL via INTRAVENOUS

## 2018-01-18 MED ORDER — HYDROXYZINE HCL 10 MG PO TABS
10.0000 mg | ORAL_TABLET | Freq: Every evening | ORAL | Status: DC | PRN
  Administered 2018-01-18: 10 mg via ORAL
  Filled 2018-01-18: qty 1

## 2018-01-18 MED ORDER — ENOXAPARIN SODIUM 40 MG/0.4ML ~~LOC~~ SOLN
40.0000 mg | SUBCUTANEOUS | Status: DC
  Administered 2018-01-18: 40 mg via SUBCUTANEOUS
  Filled 2018-01-18 (×2): qty 0.4

## 2018-01-18 MED ORDER — RISAQUAD PO CAPS
1.0000 | ORAL_CAPSULE | Freq: Every day | ORAL | Status: DC
  Administered 2018-01-18 – 2018-01-19 (×2): 1 via ORAL
  Filled 2018-01-18 (×2): qty 1

## 2018-01-18 MED ORDER — PRAVASTATIN SODIUM 40 MG PO TABS
80.0000 mg | ORAL_TABLET | Freq: Every day | ORAL | Status: DC
  Administered 2018-01-18: 80 mg via ORAL
  Filled 2018-01-18 (×2): qty 2

## 2018-01-18 MED ORDER — QUETIAPINE FUMARATE 25 MG PO TABS
25.0000 mg | ORAL_TABLET | Freq: Every day | ORAL | Status: DC
  Administered 2018-01-18: 25 mg via ORAL
  Filled 2018-01-18: qty 1

## 2018-01-18 MED ORDER — PANTOPRAZOLE SODIUM 40 MG PO TBEC
40.0000 mg | DELAYED_RELEASE_TABLET | Freq: Every day | ORAL | Status: DC
  Administered 2018-01-18 – 2018-01-19 (×2): 40 mg via ORAL
  Filled 2018-01-18 (×3): qty 1

## 2018-01-18 MED ORDER — HYDROXYZINE HCL 10 MG PO TABS
10.0000 mg | ORAL_TABLET | Freq: Once | ORAL | Status: AC
  Administered 2018-01-18: 10 mg via ORAL
  Filled 2018-01-18: qty 1

## 2018-01-18 MED ORDER — CALCIUM CARBONATE 1250 (500 CA) MG PO TABS
600.0000 mg | ORAL_TABLET | Freq: Every day | ORAL | Status: DC
  Administered 2018-01-18 – 2018-01-19 (×2): 625 mg via ORAL
  Filled 2018-01-18 (×2): qty 1

## 2018-01-18 MED ORDER — ADULT MULTIVITAMIN W/MINERALS CH
1.0000 | ORAL_TABLET | Freq: Every day | ORAL | Status: DC
  Administered 2018-01-18: 1 via ORAL
  Filled 2018-01-18 (×2): qty 1

## 2018-01-18 NOTE — Progress Notes (Signed)
Pt arrived via stretcher from ED. Alert and Oriented x4. Saline locked IV in left forearm. Alarm turned on. Call bell placed with in reach.Daughter at the bedside. Will continue to monitor.

## 2018-01-18 NOTE — Progress Notes (Signed)
OT Cancellation Note  Patient Details Name: Jill Bowman MRN: [id number] DOB: Mar 19, 1929   Cancelled Treatment:    Reason Eval/Treat Not Completed: Patient at procedure or test/ unavailable(pt in vascular lab). Will follow.  Malka So 01/18/2018, 3:31 PM

## 2018-01-18 NOTE — Care Management Note (Addendum)
Case Management Note  Patient Details  Name: Jill Bowman MRN: [id number] Date of Birth: 11-24-1929  Subjective/Objective:    Hx of Pacemaker. Admitted for Syncope; leg edema, fatigue.  Prior to admission patient lived at home.  Action/Plan: CT-head; Orthostatic vital signs; Carotid doppler; EEG; 2d echo; PT/OT eval and treat  Expected Discharge Date:     TBD            Expected Discharge Plan:  Home/Self Care  Discharge planning Services  CM Consult  Choice offered to:  Patient, Adult Children  HH Arranged:  PT [place] Agency:  [place] Status of Service:  In process, will continue to follow  Additional Comments: In to speak with patient daughter at bediside.  Discussed recommendations for [facility] PT; offered choice of [place], selected [facility], stated they used them before and would like them again.  Referral called Transition Coordinator Santiago Glad w/ [facility].  PCP noted. Home DME: Shower seat;Walker - 2 wheels;Cane - single point;Wheelchair - manual. Patient has 2 daughters. she ambulates with use of RW and is independent with ADLs. Pt is from [place] but is in [place] visiting her daughter. Pt has a son that lives in [place] and can provide 24/7 supervision/assistance if needed.   [name], BSN, RN  Nurse case manager [place] 01/18/2018, 10:35 AM

## 2018-01-18 NOTE — Progress Notes (Signed)
EEG complete- result pending. 

## 2018-01-18 NOTE — Progress Notes (Signed)
Verbal order from ogbata to give onetime dose hydroxyzine approximately 5pm, dose charted on PRN bedtime dose, one time dose put in for 2200 on 2/22 for night shift nurse
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
## 2018-01-18 NOTE — Progress Notes (Signed)
  Echocardiogram 2D Echocardiogram has been performed.  Jill Bowman T [name] 01/18/2018, 12:21 PM

## 2018-01-18 NOTE — ED Notes (Signed)
Patient transported to CT 

## 2018-01-18 NOTE — Procedures (Signed)
ELECTROENCEPHALOGRAM REPORT  Date of Study: 01/18/2018  Patient's Name: Jill Bowman MRN: [id number] Date of Birth: January 23, 1929  Referring Provider: Dr. Ivor Costa  Clinical History: This is an 84 year old woman with syncope  Medications: Neurontin  Technical Summary: A multichannel digital EEG recording measured by the international 10-20 system with electrodes applied with paste and impedances below 5000 ohms performed as portable with EKG monitoring in an awake and drowsy patient.  Hyperventilation and photic stimulation were not performed.  The digital EEG was referentially recorded, reformatted, and digitally filtered in a variety of bipolar and referential montages for optimal display.   Description: The patient is awake and drowsy during the recording.  During maximal wakefulness, there is a symmetric, medium voltage 8 Hz posterior dominant rhythm that attenuates with eye opening. This is admixed with a small amount of diffuse 5-6 Hz theta slowing of the waking background.  During drowsiness, there is an increase in theta slowing of the background.  Deeper stages of sleep were not sen. Hyperventilation and photic stimulation were not performed.  There was muscle artifact obscuring majority of EEG, in between artifact, there were no epileptiform discharges or electrographic seizures seen.    EKG lead was unremarkable.  Impression: This awake and drowsy EEG is abnormal due to mild diffuse slowing of the waking background.  Clinical Correlation of the above findings indicates diffuse cerebral dysfunction that is non-specific in etiology and can be seen with hypoxic/ischemic injury, toxic/metabolic encephalopathies, neurodegenerative disorders, or medication effect. The record is slightly limited due to muscle artifact noted above. The absence of epileptiform discharges does not rule out a clinical diagnosis of epilepsy.  Clinical correlation is advised.   Ellouise Newer, M.D.

## 2018-01-18 NOTE — Evaluation (Signed)
Physical Therapy Evaluation Patient Details Name: Jill Bowman MRN: [id number] DOB: January 22, 1929 Today's Date: 01/18/2018   History of Present Illness  Pt is an 84 y/o female admitted secondary to sustaining a syncopal episode at home; etiology unclear at this time. PMH including but not limited to CAD, DM, HTN and pacemaker placement in 2013.    Clinical Impression  Pt presented supine in bed with HOB elevated, awake and willing to participate in therapy session. Prior to admission, pt reported that she ambulates with use of RW and is independent with ADLs. Pt is from [place] but is in [place] visiting her daughter. Pt has a son that lives in [place] and can provide 24/7 supervision/assistance if needed. Pt currently requires min guard for bed mobility, min guard for transfers and min guard to ambulate a short distance within her room with use of RW. Pt would continue to benefit from skilled physical therapy services at this time while admitted and after d/c to address the below listed limitations in order to improve overall safety and independence with functional mobility.     Follow Up Recommendations Home health PT;Supervision/Assistance - 24 hour    Equipment Recommendations  None recommended by PT    Recommendations for Other Services       Precautions / Restrictions Restrictions Weight Bearing Restrictions: No      Mobility  Bed Mobility Overal bed mobility: Needs Assistance Bed Mobility: Supine to Sit     Supine to sit: Min guard     General bed mobility comments: increased time and effort, min guard for safety  Transfers Overall transfer level: Needs assistance Equipment used: Rolling walker (2 wheeled) Transfers: Sit to/from Stand Sit to Stand: Min guard         General transfer comment: good technique, min guard for safety  Ambulation/Gait Ambulation/Gait assistance: Min guard Ambulation Distance (Feet): 20 Feet Assistive device: Rolling walker (2 wheeled) Gait  Pattern/deviations: Step-through pattern;Decreased stride length;Trunk flexed Gait velocity: decreased Gait velocity interpretation: <1.8 ft/sec, indicative of risk for recurrent falls General Gait Details: pt steady with RW, no LOB or need for physical assistance, limited in distance secondary to fatigue  Stairs            Wheelchair Mobility    Modified Rankin (Stroke Patients Only)       Balance Overall balance assessment: Needs assistance Sitting-balance support: Feet supported Sitting balance-Leahy Scale: Good     Standing balance support: During functional activity;Bilateral upper extremity supported Standing balance-Leahy Scale: Poor                               Pertinent Vitals/Pain Pain Assessment: No/denies pain    Home Living Family/patient expects to be discharged to:: Private residence Living Arrangements: Alone Available Help at Discharge: Family;Available 24 hours/day Type of Home: House Home Access: Stairs to enter Entrance Stairs-Rails: Psychiatric nurse of Steps: 6 Home Layout: One level Home Equipment: Clinical cytogeneticist - 2 wheels;Cane - single point;Wheelchair - manual Additional Comments: From [place], here visiting her daughter    Prior Function Level of Independence: Independent with assistive device(s)         Comments: ambulates with use of RW     Hand Dominance        Extremity/Trunk Assessment   Upper Extremity Assessment Upper Extremity Assessment: Overall WFL for tasks assessed    Lower Extremity Assessment Lower Extremity Assessment: Generalized weakness  Communication   Communication: No difficulties  Cognition Arousal/Alertness: Awake/alert Behavior During Therapy: WFL for tasks assessed/performed Overall Cognitive Status: Within Functional Limits for tasks assessed                                 General Comments: cognition not formally assessed but [facility] for general  conversation      General Comments      Exercises     Assessment/Plan    PT Assessment Patient needs continued PT services  PT Problem List Decreased activity tolerance;Decreased balance;Decreased mobility;Decreased coordination       PT Treatment Interventions DME instruction;Gait training;Functional mobility training;Stair training;Therapeutic activities;Therapeutic exercise;Balance training;Neuromuscular re-education;Patient/family education    PT Goals (Current goals can be found in the Care Plan section)  Acute Rehab PT Goals Patient Stated Goal: return home PT Goal Formulation: With patient Time For Goal Achievement: 02/01/18 Potential to Achieve Goals: Good    Frequency Min 3X/week   Barriers to discharge        Co-evaluation               AM-PAC PT "6 Clicks" Daily Activity  Outcome Measure Difficulty turning over in bed (including adjusting bedclothes, sheets and blankets)?: A Little Difficulty moving from lying on back to sitting on the side of the bed? : None Difficulty sitting down on and standing up from a chair with arms (e.g., wheelchair, bedside commode, etc,.)?: Unable Help needed moving to and from a bed to chair (including a wheelchair)?: A Little Help needed walking in hospital room?: A Little Help needed climbing 3-5 steps with a railing? : A Lot 6 Click Score: 16    End of Session   Activity Tolerance: Patient limited by fatigue Patient left: in chair;with call bell/phone within reach;with chair alarm set Nurse Communication: Mobility status PT Visit Diagnosis: Other abnormalities of gait and mobility (R26.89)    Time: 5726-2035 PT Time Calculation (min) (ACUTE ONLY): 15 min   Charges:   PT Evaluation $PT Eval Low Complexity: 1 Low     PT G Codes:        [place], PT, DPT [place] 01/18/2018, 10:30 AM

## 2018-01-18 NOTE — Progress Notes (Signed)
Preliminary notes by tech- Bilateral carotid duplex ultrasound exam completed, 1-39% bilateral, antegrade verts.  Hongying Pearl Bents (RDMS, RVT)

## 2018-01-19 DIAGNOSIS — F445 Conversion disorder with seizures or convulsions: Principal | ICD-10-CM

## 2018-01-19 LAB — GLUCOSE, CAPILLARY
GLUCOSE-CAPILLARY: 92 mg/dL (ref 65–99)
Glucose-Capillary: 93 mg/dL (ref 65–99)

## 2018-01-19 MED ORDER — HALOPERIDOL LACTATE 5 MG/ML IJ SOLN
5.0000 mg | Freq: Once | INTRAMUSCULAR | Status: DC
  Filled 2018-01-19: qty 1

## 2018-01-19 NOTE — Progress Notes (Signed)
Jill Bowman to be D/C'd Home per MD order.  Discussed prescriptions and follow up appointments with the patient. Prescriptions given to patient, medication list explained in detail. Pt verbalized understanding.  Allergies as of 01/19/2018      Reactions   Codeine Itching, Rash, Other (See Comments)   Full body rash    Hydroxyzine Other (See Comments)   Extreme confusion and hallucinations   Lorazepam Other (See Comments)   Extreme confusion, hallucinations and hyperactivity   Penicillins Anaphylaxis, Hives, Shortness Of Breath   Tolerated cefazolin and ceftriaxone in 2013   Sulfa Antibiotics Shortness Of Breath   Zinc Itching   Ciprofloxacin Other (See Comments)   unknown   Ciprofloxacin Hives, Other (See Comments)   "think I break out in welts"   Latex Rash, Other (See Comments)   Tears skin    Penicillins Other (See Comments)   Unknown    Sulfa Antibiotics Other (See Comments)   unknown      Medication List    STOP taking these medications   acetaminophen 500 MG tablet Commonly known as:  TYLENOL   DSS 100 MG Caps   metoprolol succinate 25 MG 24 hr tablet Commonly known as:  TOPROL-XL   pantoprazole 40 MG tablet Commonly known as:  PROTONIX     TAKE these medications   acidophilus Caps capsule Take 1 capsule by mouth daily.   amiodarone 200 MG tablet Commonly known as:  PACERONE Take 100 mg by mouth daily.   aspirin 81 MG tablet Take 81 mg by mouth daily.   calcium carbonate 600 MG Tabs tablet Commonly known as:  OS-CAL Take 600 mg by mouth daily with breakfast.   cyclobenzaprine 10 MG tablet Commonly known as:  FLEXERIL Take 10 mg by mouth 2 (two) times daily.   doxycycline 100 MG tablet Commonly known as:  VIBRA-TABS Take 100 mg by mouth 2 (two) times daily.   esomeprazole 20 MG capsule Commonly known as:  NEXIUM Take 20 mg by mouth daily.   gabapentin 300 MG capsule Commonly known as:  NEURONTIN Take 300 mg by mouth 2 (two) times daily.   hydrOXYzine 10 MG tablet Commonly known as:  ATARAX/VISTARIL Take 10 mg by mouth at bedtime as needed.   loratadine 10 MG tablet Commonly known as:  CLARITIN Take 10 mg by mouth at bedtime.   metoprolol tartrate 25 MG tablet Commonly known as:  LOPRESSOR Take 25 mg by mouth daily.   multivitamin with minerals Tabs tablet Take 1 tablet by mouth daily.   nitroGLYCERIN 0.4 MG SL tablet Commonly known as:  NITROSTAT Place 0.4 mg under the tongue every 5 (five) minutes as needed for chest pain.   nystatin powder Commonly known as:  MYCOSTATIN/NYSTOP Apply 1 application topically 3 (three) times daily.   pravastatin 80 MG tablet Commonly known as:  PRAVACHOL Take 80 mg by mouth daily.   QUEtiapine 25 MG tablet Commonly known as:  SEROQUEL Take 25 mg by mouth at bedtime.   senna 8.6 MG tablet Commonly known as:  SENOKOT Take 1 tablet by mouth 2 (two) times daily.   Vitamin D 2000 units Caps Take 2,000 Units by mouth daily.       Vitals:   01/19/18 0500 01/19/18 0900  BP: (!) 109/44 (!) 112/49  Pulse: 70 64  Resp: 16 16  Temp: 98.3 F (36.8 C) 97.7 F (36.5 C)  SpO2: 97% 98%    Skin clean, dry and intact without evidence of skin break  down, no evidence of skin tears noted. IV catheter discontinued intact. Site without signs and symptoms of complications. Dressing and pressure applied. Pt denies pain at this time. No complaints noted.  An After Visit Summary was printed and given to the patient. Patient escorted via [place], and D/C home via private auto.  Chuck Hint RN [facility] [employer]

## 2018-01-19 NOTE — Discharge Summary (Signed)
Physician Discharge Summary  Patient ID: Jill Bowman MRN: [id number] DOB/AGE: Jul 18, 1929 84 y.o.  Admit date: 01/17/2018 Discharge date: 01/19/2018  Admission Diagnoses:  Discharge Diagnoses:  Principal Problem:   Syncope Active Problems:   Pacemaker   Tachycardia-bradycardia syndrome ([place])   Coronary artery disease   Morbid obesity ([place])   Hyperlipidemia   Abscess   GERD (gastroesophageal reflux disease)   Depression   PAF (paroxysmal atrial fibrillation) ([facility])   HLD (hyperlipidemia)   Discharged Condition: stable  Brief history: Patient is an 84 year old female past medical history significant for hypertension, hyperlipidemia dyspnea, diet-controlled diabetes, asthma, GERD, depression, pacemaker placement, obesity, CAD, stent placement, tachycardia-bradycardia syndrome, atrial fibrillation not on before meals.  Patient also has fairly advanced dementia.  Patient was admitted with pseudoseizure and possible syncope.  Patient was worked up extensively.  EEG was negative for seizure.  Cardiology team was consulted and patient was seen by the patient's cardiology team.  According to the cardiologist, the event was likely noncardiac.  The patient had a pacemaker that was interrogated.  Hospital Course:  Syncope:  This was actually seduced seizure.  Workup came back negative.  Currently see above.  Patient was also seen by the cardiologist and the pacemaker was in interrogated.  The event was said to be noncardiac.  Tachycardia-bradycardia syndrome [facility]): - s/p of Pacemaker  CAD: s/p of stent. No CP -continue aspirin, pravastatin, metoprolol  HLD: -Pravastatin  Consults: Cardiology.  Significant Diagnostic Studies: EEG was negative for seizure.  Carotid Doppler ultrasound did not reveal any significant stenosis.  Echo revealed EF of 65-70%, mild LVH, normal wall motion and grade 1 diastolic dysfunction.  CT of the head without contrast was negative.  Discharge  Exam: Blood pressure (!) 112/49, pulse 64, temperature 97.7 F (36.5 C), temperature source Oral, resp. rate 16, height 5\' 6"  (1.676 m), weight 94.3 kg (207 lb 14.3 oz), SpO2 98 %.   Disposition: 06-Home-Health Care Svc  Discharge Instructions    Call MD for:   Complete by:  As directed    Please call MD if the symptoms worsen.   Diet - low sodium heart healthy   Complete by:  As directed    Increase activity slowly   Complete by:  As directed      Allergies as of 01/19/2018      Reactions   Codeine Itching, Rash, Other (See Comments)   Full body rash    Hydroxyzine Other (See Comments)   Extreme confusion and hallucinations   Lorazepam Other (See Comments)   Extreme confusion, hallucinations and hyperactivity   Penicillins Anaphylaxis, Hives, Shortness Of Breath   Tolerated cefazolin and ceftriaxone in 2013   Sulfa Antibiotics Shortness Of Breath   Zinc Itching   Ciprofloxacin Other (See Comments)   unknown   Ciprofloxacin Hives, Other (See Comments)   "think I break out in welts"   Latex Rash, Other (See Comments)   Tears skin    Penicillins Other (See Comments)   Unknown    Sulfa Antibiotics Other (See Comments)   unknown      Medication List    STOP taking these medications   acetaminophen 500 MG tablet Commonly known as:  TYLENOL   DSS 100 MG Caps   metoprolol succinate 25 MG 24 hr tablet Commonly known as:  TOPROL-XL   pantoprazole 40 MG tablet Commonly known as:  PROTONIX     TAKE these medications   acidophilus Caps capsule Take 1 capsule by mouth daily.  amiodarone 200 MG tablet Commonly known as:  PACERONE Take 100 mg by mouth daily.   aspirin 81 MG tablet Take 81 mg by mouth daily.   calcium carbonate 600 MG Tabs tablet Commonly known as:  OS-CAL Take 600 mg by mouth daily with breakfast.   cyclobenzaprine 10 MG tablet Commonly known as:  FLEXERIL Take 10 mg by mouth 2 (two) times daily.   doxycycline 100 MG tablet Commonly known  as:  VIBRA-TABS Take 100 mg by mouth 2 (two) times daily.   esomeprazole 20 MG capsule Commonly known as:  NEXIUM Take 20 mg by mouth daily.   gabapentin 300 MG capsule Commonly known as:  NEURONTIN Take 300 mg by mouth 2 (two) times daily.   hydrOXYzine 10 MG tablet Commonly known as:  ATARAX/VISTARIL Take 10 mg by mouth at bedtime as needed.   loratadine 10 MG tablet Commonly known as:  CLARITIN Take 10 mg by mouth at bedtime.   metoprolol tartrate 25 MG tablet Commonly known as:  LOPRESSOR Take 25 mg by mouth daily.   multivitamin with minerals Tabs tablet Take 1 tablet by mouth daily.   nitroGLYCERIN 0.4 MG SL tablet Commonly known as:  NITROSTAT Place 0.4 mg under the tongue every 5 (five) minutes as needed for chest pain.   nystatin powder Commonly known as:  MYCOSTATIN/NYSTOP Apply 1 application topically 3 (three) times daily.   pravastatin 80 MG tablet Commonly known as:  PRAVACHOL Take 80 mg by mouth daily.   QUEtiapine 25 MG tablet Commonly known as:  SEROQUEL Take 25 mg by mouth at bedtime.   senna 8.6 MG tablet Commonly known as:  SENOKOT Take 1 tablet by mouth 2 (two) times daily.   Vitamin D 2000 units Caps Take 2,000 Units by mouth daily.      Follow-up Information    [facility].   Specialty:  Home Health Services Why:  Will call you to set up visits. Contact information: [address] [place] [address] [phone]           Signed: Bonnell Public 01/19/2018, 11:21 AM

## 2018-01-21 DIAGNOSIS — F445 Conversion disorder with seizures or convulsions: Secondary | ICD-10-CM

## 2018-01-21 DIAGNOSIS — G301 Alzheimer's disease with late onset: Secondary | ICD-10-CM

## 2018-01-21 DIAGNOSIS — F0280 Dementia in other diseases classified elsewhere without behavioral disturbance: Secondary | ICD-10-CM

## 2018-01-21 NOTE — Progress Notes (Signed)
PROGRESS NOTE    Jill Bowman  WER:[id number] DOB: 11-Aug-1929 DOA: 01/17/2018 PCP: Josetta Huddle, MD  Outpatient Specialists:     Brief Narrative: Patient is an 84 year old female past medical history significant for hypertension, hyperlipidemia dyspnea, diet-controlled diabetes, asthma, GERD, depression, pacemaker placement, obesity, CAD, stent placement, tachycardia-bradycardia syndrome, atrial fibrillation not on before meals.  Patient also has fairly advanced dementia.  Patient was admitted with pseudoseizure and possible syncope.  Patient's workup is in progress.  The cardiology team has been consulted.  Assessment & Plan:   Principal Problem:   Syncope Active Problems:   Pacemaker   Tachycardia-bradycardia syndrome ([place])   Coronary artery disease   Morbid obesity ([facility])   Hyperlipidemia   Abscess   GERD (gastroesophageal reflux disease)   Depression   PAF (paroxysmal atrial fibrillation) ([facility])   HLD (hyperlipidemia)   Seizures seizure/syncope:  Complete workup.  Patient seen alongside patient's daughter.  History points to worse suggest seizure.  He also has fairly dementia.  Has a pacemaker in situ.  Consults cardiology to rule out cardiac cause of syncope.    Tachycardia-bradycardia syndrome ([place]): - s/p ofPacemaker -For the pacemaker interrogation.  CAD:s/p of stent. No CP -continueaspirin, pravastatin, metoprolol  HLD: -Pravastatin  Dementia: This is fairly advanced.  This may actually account for this induced seizure.  Continue to manage expectantly.  Discharge under right breat: no fever or leukocytosis -Continue doxycycline -Patient's PCP is already managing.  GERD: -Protonix  PAF: CHA2DS2-VASc Score is 6, needs oral anticoagulation, but patient is no on AC at home, possibly due to high risk for fall due to old age and dementia. Heart rate is well controlled. -continue metoprolol and amiodarone  HTN:  -Continue home medications:  Metoprolol -IV hydralazine prn  DVT ppx: SQ Lovenox Code Status: Full code Family Communication: Patient's daughter.   Disposition Plan: Likely home eventually.   Consultants:   Cardiology.   Procedures:   EEG  Echocardiogram  (But results are still pending)  Antimicrobials:   None   Subjective: No complaints.   Due to advanced dementia, the patient cannot give any history.  Objective: Vitals:   01/18/18 2254 01/19/18 0414 01/19/18 0500 01/19/18 0900  BP: (!) 108/41  (!) 109/44 (!) 112/49  Pulse: 69  70 64  Resp: 16  16 16   Temp: 99.1 F (37.3 C)  98.3 F (36.8 C) 97.7 F (36.5 C)  TempSrc: Oral  Oral Oral  SpO2: 100%  97% 98%  Weight: 94.3 kg (208 lb) 94.3 kg (207 lb 14.3 oz)    Height:       No intake or output data in the 24 hours ending 01/21/18 0203 Filed Weights   01/18/18 0710 01/18/18 2254 01/19/18 0414  Weight: 95 kg (209 lb 7 oz) 94.3 kg (208 lb) 94.3 kg (207 lb 14.3 oz)    Examination:  General exam: Appears calm and comfortable.  Obese. Respiratory system: Clear to auscultation.  Cardiovascular system: S1 & S2 . No pedal edema. Gastrointestinal system: Abdomen is nondistended, soft and nontender. No organomegaly or masses felt. Normal bowel sounds heard. Central nervous system: Alert and oriented. No focal neurological deficits. Extremities: Symmetric 5 x 5 power.  Data Reviewed: I have personally reviewed following labs and imaging studies  CBC: Recent Labs  Lab 01/17/18 2155 01/17/18 2221 01/18/18 0430  WBC 9.1  --  7.7  NEUTROABS 6.9  --   --   HGB 12.5 12.9 11.8*  HCT 38.0 38.0 36.3  MCV 97.4  --  96.0  PLT 248  --  967   Basic Metabolic Panel: Recent Labs  Lab 01/17/18 2155 01/17/18 2221 01/18/18 0430  NA 137 141 141  K 3.8 3.8 4.1  CL 104 104 106  CO2 23  --  25  GLUCOSE 109* 107* 104*  BUN 19 21* 17  CREATININE 1.04* 1.00 0.96  CALCIUM 8.7*  --  9.0   GFR: Estimated Creatinine Clearance: 48.7 mL/min (by C-G  formula based on SCr of 0.96 mg/dL). Liver Function Tests: Recent Labs  Lab 01/17/18 2155  AST 27  ALT 13*  ALKPHOS 122  BILITOT 0.3  PROT 7.2  ALBUMIN 3.5   No results for input(s): LIPASE, AMYLASE in the last 168 hours. No results for input(s): AMMONIA in the last 168 hours. Coagulation Profile: No results for input(s): INR, PROTIME in the last 168 hours. Cardiac Enzymes: Recent Labs  Lab 01/17/18 2355  TROPONINI <0.03   BNP (last 3 results) No results for input(s): PROBNP in the last 8760 hours. HbA1C: No results for input(s): HGBA1C in the last 72 hours. CBG: Recent Labs  Lab 01/17/18 2140 01/18/18 1222 01/19/18 0617 01/19/18 0757  GLUCAP 81 113* 92 93   Lipid Profile: No results for input(s): CHOL, HDL, LDLCALC, TRIG, CHOLHDL, LDLDIRECT in the last 72 hours. Thyroid Function Tests: No results for input(s): TSH, T4TOTAL, FREET4, T3FREE, THYROIDAB in the last 72 hours. Anemia Panel: No results for input(s): VITAMINB12, FOLATE, FERRITIN, TIBC, IRON, RETICCTPCT in the last 72 hours. Urine analysis:    Component Value Date/Time   COLORURINE YELLOW 01/18/2018 0048   APPEARANCEUR HAZY (A) 01/18/2018 0048   LABSPEC 1.016 01/18/2018 0048   PHURINE 7.0 01/18/2018 0048   GLUCOSEU NEGATIVE 01/18/2018 0048   HGBUR NEGATIVE 01/18/2018 0048   BILIRUBINUR NEGATIVE 01/18/2018 0048   KETONESUR NEGATIVE 01/18/2018 0048   PROTEINUR NEGATIVE 01/18/2018 0048   UROBILINOGEN 1.0 10/19/2014 1756   NITRITE NEGATIVE 01/18/2018 0048   LEUKOCYTESUR NEGATIVE 01/18/2018 0048   Sepsis Labs: @LABRCNTIP (procalcitonin:4,lacticidven:4)  ) Recent Results (from the past 240 hour(s))  MRSA PCR Screening     Status: None   Collection Time: 01/18/18  1:31 PM  Result Value Ref Range Status   MRSA by PCR NEGATIVE NEGATIVE Final    Comment:        The GeneXpert MRSA Assay (FDA approved for NASAL specimens only), is one component of a comprehensive MRSA colonization surveillance  program. It is not intended to diagnose MRSA infection nor to guide or monitor treatment for MRSA infections. Performed at [facility], [place] [address], [place], [place] [address]          Radiology Studies: No results found.      Scheduled Meds: Continuous Infusions:   LOS: 1 day    Time spent: 30 minutes    Dana Allan, MD  [facility] Pager #: [phone] 7PM-7AM contact night coverage as above

## 2018-02-06 ENCOUNTER — Ambulatory Visit (INDEPENDENT_AMBULATORY_CARE_PROVIDER_SITE_OTHER): Payer: Medicare Other | Admitting: Orthopaedic Surgery

## 2018-02-06 ENCOUNTER — Encounter (INDEPENDENT_AMBULATORY_CARE_PROVIDER_SITE_OTHER): Payer: Self-pay | Admitting: Orthopaedic Surgery

## 2018-02-06 ENCOUNTER — Ambulatory Visit (INDEPENDENT_AMBULATORY_CARE_PROVIDER_SITE_OTHER): Payer: Medicare Other

## 2018-02-06 DIAGNOSIS — M79672 Pain in left foot: Secondary | ICD-10-CM | POA: Diagnosis not present

## 2018-02-06 DIAGNOSIS — M79671 Pain in right foot: Secondary | ICD-10-CM

## 2018-02-06 DIAGNOSIS — M19072 Primary osteoarthritis, left ankle and foot: Secondary | ICD-10-CM
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
## 2018-04-08 ENCOUNTER — Other Ambulatory Visit (INDEPENDENT_AMBULATORY_CARE_PROVIDER_SITE_OTHER): Payer: Self-pay

## 2018-04-08 MED ORDER — CYCLOBENZAPRINE HCL 10 MG PO TABS: 10.0000 mg | ORAL_TABLET | Freq: Two times a day (BID) | ORAL | 0 refills | Status: DC | PRN

## 2018-04-18 DIAGNOSIS — F0391 Unspecified dementia with behavioral disturbance: Secondary | ICD-10-CM | POA: Diagnosis not present

## 2018-04-18 DIAGNOSIS — B372 Candidiasis of skin and nail: Secondary | ICD-10-CM | POA: Diagnosis not present

## 2018-04-18 DIAGNOSIS — E782 Mixed hyperlipidemia: Secondary | ICD-10-CM | POA: Diagnosis not present

## 2018-04-18 DIAGNOSIS — Z23 Encounter for immunization: Secondary | ICD-10-CM | POA: Diagnosis not present

## 2018-04-18 DIAGNOSIS — I10 Essential (primary) hypertension: Secondary | ICD-10-CM | POA: Diagnosis not present

## 2018-04-29 ENCOUNTER — Encounter: Payer: Self-pay | Admitting: Neurology

## 2018-06-28 ENCOUNTER — Telehealth: Payer: Self-pay | Admitting: Neurology

## 2018-07-01 ENCOUNTER — Ambulatory Visit: Payer: No Typology Code available for payment source | Admitting: Neurology

## 2018-08-07 DIAGNOSIS — F0391 Unspecified dementia with behavioral disturbance: Secondary | ICD-10-CM | POA: Diagnosis not present

## 2018-08-07 DIAGNOSIS — H9313 Tinnitus, bilateral: Secondary | ICD-10-CM | POA: Diagnosis not present

## 2018-08-07 DIAGNOSIS — Z23 Encounter for immunization: Secondary | ICD-10-CM | POA: Diagnosis not present

## 2018-08-07 DIAGNOSIS — E782 Mixed hyperlipidemia: Secondary | ICD-10-CM | POA: Diagnosis not present

## 2018-08-07 DIAGNOSIS — I10 Essential (primary) hypertension: Secondary | ICD-10-CM | POA: Diagnosis not present

## 2018-08-07 DIAGNOSIS — L02211 Cutaneous abscess of abdominal wall: Secondary | ICD-10-CM | POA: Diagnosis not present

## 2018-08-07 DIAGNOSIS — R7309 Other abnormal glucose: Secondary | ICD-10-CM | POA: Diagnosis not present

## 2018-08-07 DIAGNOSIS — I129 Hypertensive chronic kidney disease with stage 1 through stage 4 chronic kidney disease, or unspecified chronic kidney disease: Secondary | ICD-10-CM | POA: Diagnosis not present

## 2018-08-22 DIAGNOSIS — R944 Abnormal results of kidney function studies: Secondary | ICD-10-CM | POA: Diagnosis not present

## 2018-08-22 DIAGNOSIS — R945 Abnormal results of liver function studies: Secondary | ICD-10-CM | POA: Diagnosis not present

## 2018-10-05 ENCOUNTER — Other Ambulatory Visit: Payer: Self-pay | Admitting: Nurse Practitioner

## 2018-10-11 ENCOUNTER — Other Ambulatory Visit: Payer: Self-pay

## 2018-10-29 ENCOUNTER — Encounter: Payer: Self-pay | Admitting: Podiatry

## 2018-10-29 ENCOUNTER — Ambulatory Visit (INDEPENDENT_AMBULATORY_CARE_PROVIDER_SITE_OTHER): Payer: Medicare Other | Admitting: Podiatry

## 2018-10-29 DIAGNOSIS — B351 Tinea unguium: Secondary | ICD-10-CM | POA: Diagnosis not present

## 2018-10-29 DIAGNOSIS — M79674 Pain in right toe(s): Secondary | ICD-10-CM | POA: Diagnosis not present

## 2018-10-29 DIAGNOSIS — M79675 Pain in left toe(s): Secondary | ICD-10-CM
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
## 2018-11-07 ENCOUNTER — Encounter: Payer: Self-pay | Admitting: Internal Medicine

## 2018-11-07 ENCOUNTER — Ambulatory Visit (INDEPENDENT_AMBULATORY_CARE_PROVIDER_SITE_OTHER): Payer: Medicare Other | Admitting: Internal Medicine

## 2018-11-07 ENCOUNTER — Other Ambulatory Visit: Payer: Self-pay | Admitting: Internal Medicine

## 2018-11-07 VITALS — BP 130/70 | HR 100 | Temp 98.2°F | Ht 66.0 in | Wt 211.2 lb

## 2018-11-07 DIAGNOSIS — G4700 Insomnia, unspecified: Secondary | ICD-10-CM | POA: Diagnosis not present

## 2018-11-07 DIAGNOSIS — N289 Disorder of kidney and ureter, unspecified: Secondary | ICD-10-CM | POA: Diagnosis not present

## 2018-11-07 DIAGNOSIS — S31149A Puncture wound of abdominal wall with foreign body, unspecified quadrant without penetration into peritoneal cavity, initial encounter: Secondary | ICD-10-CM

## 2018-11-07 DIAGNOSIS — Z09 Encounter for follow-up examination after completed treatment for conditions other than malignant neoplasm: Secondary | ICD-10-CM

## 2018-11-07 DIAGNOSIS — R82998 Other abnormal findings in urine: Secondary | ICD-10-CM | POA: Diagnosis not present

## 2018-11-07 DIAGNOSIS — R410 Disorientation, unspecified: Secondary | ICD-10-CM | POA: Diagnosis not present

## 2018-11-07 DIAGNOSIS — Z955 Presence of coronary angioplasty implant and graft: Secondary | ICD-10-CM | POA: Insufficient documentation

## 2018-11-07 DIAGNOSIS — F0390 Unspecified dementia without behavioral disturbance: Secondary | ICD-10-CM

## 2018-11-07 LAB — POCT URINALYSIS DIPSTICK
BILIRUBIN UA: NEGATIVE
Blood, UA: NEGATIVE
Glucose, UA: NEGATIVE
Ketones, UA: NEGATIVE
NITRITE UA: NEGATIVE
PH UA: 7 (ref 5.0–8.0)
Protein, UA: NEGATIVE
Spec Grav, UA: 1.015 (ref 1.010–1.025)
Urobilinogen, UA: 0.2 E.U./dL

## 2018-11-07 LAB — POCT UA - MICROALBUMIN
ALBUMIN/CREATININE RATIO, URINE, POC: 30
Creatinine, POC: 300 mg/dL
Microalbumin Ur, POC: 30 mg/L
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
## 2018-11-08 LAB — CMP14 + ANION GAP
A/G RATIO: 1.2 (ref 1.2–2.2)
ALK PHOS: 122 IU/L — AB (ref 39–117)
ALT: 11 IU/L (ref 0–32)
AST: 25 IU/L (ref 0–40)
Albumin: 3.9 g/dL (ref 3.5–4.7)
Anion Gap: 18 mmol/L (ref 10.0–18.0)
BUN/Creatinine Ratio: 19 (ref 12–28)
BUN: 21 mg/dL (ref 8–27)
Bilirubin Total: <0.2 mg/dL (ref 0.0–1.2)
CO2: 23 mmol/L (ref 20–29)
Calcium: 9.1 mg/dL (ref 8.7–10.3)
Chloride: 102 mmol/L (ref 96–106)
Creatinine, Ser: 1.1 mg/dL — ABNORMAL HIGH (ref 0.57–1.00)
GFR calc Af Amer: 52 mL/min/{1.73_m2} — ABNORMAL LOW (ref >59)
GFR calc non Af Amer: 45 mL/min/{1.73_m2} — ABNORMAL LOW (ref >59)
GLUCOSE: 95 mg/dL (ref 65–99)
Globulin, Total: 3.3 g/dL (ref 1.5–4.5)
POTASSIUM: 4.5 mmol/L (ref 3.5–5.2)
Sodium: 143 mmol/L (ref 134–144)
Total Protein: 7.2 g/dL (ref 6.0–8.5)

## 2018-11-25 ENCOUNTER — Other Ambulatory Visit: Payer: Self-pay | Admitting: Nurse Practitioner

## 2018-11-28 ENCOUNTER — Other Ambulatory Visit: Payer: Self-pay | Admitting: Nurse Practitioner

## 2018-12-13 DIAGNOSIS — Z950 Presence of cardiac pacemaker: Secondary | ICD-10-CM | POA: Diagnosis not present

## 2018-12-13 DIAGNOSIS — Z45018 Encounter for adjustment and management of other part of cardiac pacemaker: Secondary | ICD-10-CM | POA: Diagnosis not present

## 2018-12-21 ENCOUNTER — Other Ambulatory Visit: Payer: Self-pay | Admitting: Nurse Practitioner

## 2019-01-28 ENCOUNTER — Other Ambulatory Visit: Payer: Self-pay | Admitting: Nurse Practitioner

## 2019-01-28 ENCOUNTER — Ambulatory Visit: Payer: Medicare Other | Admitting: Podiatry

## 2019-03-04 ENCOUNTER — Encounter: Payer: Self-pay | Admitting: Cardiology
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
## 2019-03-05 ENCOUNTER — Other Ambulatory Visit: Payer: Self-pay

## 2019-03-05 ENCOUNTER — Encounter: Payer: Self-pay | Admitting: Cardiology

## 2019-03-05 ENCOUNTER — Ambulatory Visit (INDEPENDENT_AMBULATORY_CARE_PROVIDER_SITE_OTHER): Payer: Medicare Other | Admitting: Cardiology

## 2019-03-05 VITALS — BP 151/83 | HR 70 | Ht 68.0 in | Wt 212.0 lb

## 2019-03-05 DIAGNOSIS — I471 Supraventricular tachycardia: Secondary | ICD-10-CM

## 2019-03-05 DIAGNOSIS — Z950 Presence of cardiac pacemaker: Secondary | ICD-10-CM

## 2019-03-05 DIAGNOSIS — I2510 Atherosclerotic heart disease of native coronary artery without angina pectoris: Secondary | ICD-10-CM | POA: Diagnosis not present

## 2019-03-05 DIAGNOSIS — I495 Sick sinus syndrome: Secondary | ICD-10-CM | POA: Diagnosis not present

## 2019-03-08 ENCOUNTER — Other Ambulatory Visit: Payer: Self-pay | Admitting: Nurse Practitioner

## 2019-03-10 NOTE — Telephone Encounter (Signed)
Cyclobenzaprine refill 

## 2019-03-12 ENCOUNTER — Other Ambulatory Visit: Payer: Self-pay | Admitting: Nurse Practitioner

## 2019-03-12 MED ORDER — CYCLOBENZAPRINE HCL 10 MG PO TABS: 10.0000 mg | ORAL_TABLET | Freq: Two times a day (BID) | ORAL | 0 refills | Status: DC | PRN

## 2019-03-13 ENCOUNTER — Ambulatory Visit: Payer: Medicare Other | Admitting: Internal Medicine

## 2019-03-18 NOTE — Telephone Encounter (Signed)
It looked like a refill was sent earlier this month if not you may send.

## 2019-04-17 ENCOUNTER — Other Ambulatory Visit: Payer: Self-pay | Admitting: Nurse Practitioner

## 2019-05-01 ENCOUNTER — Ambulatory Visit: Payer: Medicare Other | Admitting: Internal Medicine

## 2019-05-08 ENCOUNTER — Other Ambulatory Visit: Payer: Self-pay | Admitting: Nurse Practitioner

## 2019-05-09 ENCOUNTER — Other Ambulatory Visit: Payer: Self-pay

## 2019-05-09 MED ORDER — AMIODARONE HCL 200 MG PO TABS: ORAL_TABLET | ORAL | 0 refills | Status: DC

## 2019-05-22 ENCOUNTER — Telehealth: Payer: Self-pay

## 2019-05-22 ENCOUNTER — Other Ambulatory Visit: Payer: Self-pay

## 2019-05-22 ENCOUNTER — Telehealth: Payer: Self-pay | Admitting: Orthopaedic Surgery

## 2019-05-22 MED ORDER — DICLOFENAC SODIUM 1 % TD GEL: 2.0000 g | Freq: Four times a day (QID) | TRANSDERMAL | 2 refills | Status: DC

## 2019-05-22 NOTE — Telephone Encounter (Signed)
Sent in to pharmacy.  

## 2019-05-22 NOTE — Telephone Encounter (Signed)
The patient's daughter Shauna Hugh porteous called and wanted to schedule her mother for a virtual appointment because she doesn't think it's safe to bring her mother out. The virtual appt was scheduled.

## 2019-05-22 NOTE — Telephone Encounter (Signed)
Pt called in said they need a refill on prescription for Diclofenac sodium topical gel 1% for her heel pain. Please have that sent to [facility] on [address] in [facility].  [phone]

## 2019-06-05 ENCOUNTER — Ambulatory Visit: Payer: Medicare Other | Admitting: Internal Medicine

## 2019-06-09 ENCOUNTER — Ambulatory Visit (INDEPENDENT_AMBULATORY_CARE_PROVIDER_SITE_OTHER): Payer: Medicare Other | Admitting: Cardiology

## 2019-06-09 ENCOUNTER — Telehealth: Payer: Self-pay | Admitting: Internal Medicine

## 2019-06-09 ENCOUNTER — Other Ambulatory Visit: Payer: Self-pay

## 2019-06-09 ENCOUNTER — Encounter: Payer: Self-pay | Admitting: Cardiology

## 2019-06-09 VITALS — BP 121/70 | HR 75 | Ht 68.0 in | Wt 205.0 lb

## 2019-06-09 DIAGNOSIS — E7800 Pure hypercholesterolemia, unspecified: Secondary | ICD-10-CM

## 2019-06-09 DIAGNOSIS — Z950 Presence of cardiac pacemaker: Secondary | ICD-10-CM

## 2019-06-09 DIAGNOSIS — I495 Sick sinus syndrome: Secondary | ICD-10-CM | POA: Diagnosis not present

## 2019-06-09 DIAGNOSIS — I2510 Atherosclerotic heart disease of native coronary artery without angina pectoris: Secondary | ICD-10-CM

## 2019-06-09 DIAGNOSIS — I471 Supraventricular tachycardia: Secondary | ICD-10-CM | POA: Diagnosis not present

## 2019-06-09 DIAGNOSIS — I4719 Other supraventricular tachycardia: Secondary | ICD-10-CM
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
## 2019-06-09 NOTE — Telephone Encounter (Signed)
I left a message asking the pt to call me at [phone] to schedule AWV with [place] (due after 08/08/2019). VDM (DD)

## 2019-06-12 DIAGNOSIS — Z45018 Encounter for adjustment and management of other part of cardiac pacemaker: Secondary | ICD-10-CM | POA: Diagnosis not present

## 2019-06-12 DIAGNOSIS — I472 Ventricular tachycardia: Secondary | ICD-10-CM | POA: Diagnosis not present

## 2019-06-12 DIAGNOSIS — Z950 Presence of cardiac pacemaker: Secondary | ICD-10-CM | POA: Diagnosis not present

## 2019-06-14 ENCOUNTER — Encounter: Payer: Self-pay | Admitting: Cardiology

## 2019-06-16 ENCOUNTER — Telehealth: Payer: Self-pay

## 2019-06-16 NOTE — Telephone Encounter (Signed)
-----   Message from Adrian Prows, MD sent at 06/14/2019  1:54 PM EDT ----- Regarding: Pacemaker Normal pacemaker function. Longevity > 5 years. Can you have Ambucor rest my to prelim, I signed off by accident.  JG

## 2019-06-16 NOTE — Telephone Encounter (Signed)
Pt daughter aware of transmission

## 2019-06-19 ENCOUNTER — Ambulatory Visit: Payer: Medicare Other | Admitting: Internal Medicine

## 2019-06-19 ENCOUNTER — Ambulatory Visit (INDEPENDENT_AMBULATORY_CARE_PROVIDER_SITE_OTHER): Payer: Medicare Other | Admitting: Internal Medicine

## 2019-06-19 ENCOUNTER — Other Ambulatory Visit: Payer: Self-pay

## 2019-06-19 ENCOUNTER — Encounter: Payer: Self-pay | Admitting: Internal Medicine

## 2019-06-19 VITALS — BP 128/80 | HR 74 | Temp 97.8°F | Ht 60.8 in | Wt 207.2 lb

## 2019-06-19 DIAGNOSIS — I2510 Atherosclerotic heart disease of native coronary artery without angina pectoris: Secondary | ICD-10-CM | POA: Diagnosis not present

## 2019-06-19 DIAGNOSIS — I495 Sick sinus syndrome: Secondary | ICD-10-CM | POA: Diagnosis not present

## 2019-06-19 DIAGNOSIS — E7800 Pure hypercholesterolemia, unspecified: Secondary | ICD-10-CM | POA: Diagnosis not present
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
## 2019-06-20 LAB — CMP14+EGFR
ALT: 9 IU/L (ref 0–32)
AST: 19 IU/L (ref 0–40)
Albumin/Globulin Ratio: 1.6 (ref 1.2–2.2)
Albumin: 4.2 g/dL (ref 3.6–4.6)
Alkaline Phosphatase: 136 IU/L — ABNORMAL HIGH (ref 39–117)
BUN/Creatinine Ratio: 14 (ref 12–28)
BUN: 16 mg/dL (ref 8–27)
Bilirubin Total: 0.3 mg/dL (ref 0.0–1.2)
CO2: 23 mmol/L (ref 20–29)
Calcium: 9.2 mg/dL (ref 8.7–10.3)
Chloride: 101 mmol/L (ref 96–106)
Creatinine, Ser: 1.18 mg/dL — ABNORMAL HIGH (ref 0.57–1.00)
GFR calc Af Amer: 48 mL/min/{1.73_m2} — ABNORMAL LOW (ref >59)
GFR calc non Af Amer: 41 mL/min/{1.73_m2} — ABNORMAL LOW (ref >59)
Globulin, Total: 2.6 g/dL (ref 1.5–4.5)
Glucose: 102 mg/dL — ABNORMAL HIGH (ref 65–99)
Potassium: 4.6 mmol/L (ref 3.5–5.2)
Sodium: 140 mmol/L (ref 134–144)
Total Protein: 6.8 g/dL (ref 6.0–8.5)

## 2019-06-20 LAB — LIPID PANEL WITH LDL/HDL RATIO
Cholesterol, Total: 113 mg/dL (ref 100–199)
HDL: 48 mg/dL (ref >39)
LDL Calculated: 41 mg/dL (ref 0–99)
LDl/HDL Ratio: 0.9 ratio (ref 0.0–3.2)
Triglycerides: 121 mg/dL (ref 0–149)
VLDL Cholesterol Cal: 24 mg/dL (ref 5–40)

## 2019-06-20 LAB — CBC
Hematocrit: 38.9 % (ref 34.0–46.6)
Hemoglobin: 13.2 g/dL (ref 11.1–15.9)
MCH: 32.2 pg (ref 26.6–33.0)
MCHC: 33.9 g/dL (ref 31.5–35.7)
MCV: 95 fL (ref 79–97)
Platelets: 231 10*3/uL (ref 150–450)
RBC: 4.1 x10E6/uL (ref 3.77–5.28)
RDW: 13 % (ref 11.7–15.4)
WBC: 8.1 10*3/uL (ref 3.4–10.8)

## 2019-06-20 LAB — TSH: TSH: 2.85 u[IU]/mL (ref 0.450–4.500)

## 2019-06-23 NOTE — Progress Notes (Signed)
lvm

## 2019-06-24 ENCOUNTER — Ambulatory Visit: Payer: Self-pay | Admitting: Nurse Practitioner

## 2019-06-26 ENCOUNTER — Telehealth: Payer: Self-pay | Admitting: Orthopaedic Surgery

## 2019-06-26 ENCOUNTER — Other Ambulatory Visit: Payer: Self-pay | Admitting: Cardiology

## 2019-06-26 ENCOUNTER — Other Ambulatory Visit: Payer: Self-pay | Admitting: Nurse Practitioner

## 2019-06-26 NOTE — Telephone Encounter (Signed)
I cannot find anywhere where we have ever prescribed this medication for this patient.  She was seen by Artis Delay a year ago but this was for foot and ankle issues.  From what I can see, this is the only time we seen her.  I may be wrong but regardless, we cannot provide that type of medication for patient that has not been seen in a long period of time.

## 2019-06-26 NOTE — Telephone Encounter (Signed)
Patient's daughter called requesting an RX refill for the patient's Flexeril and would also like to increase it from a quantity of 30 to 60.  I did not see that Dr. Ninfa Linden had prescribed this for her.  She would like this prescription sent to [facility] in [place].  CB#[phone].  Thank you.

## 2019-06-26 NOTE — Telephone Encounter (Signed)
See below

## 2019-06-27 NOTE — Telephone Encounter (Signed)
Patient daughter aware of the below message

## 2019-07-07 ENCOUNTER — Other Ambulatory Visit: Payer: Self-pay

## 2019-07-07 ENCOUNTER — Ambulatory Visit (INDEPENDENT_AMBULATORY_CARE_PROVIDER_SITE_OTHER): Payer: Medicare Other | Admitting: Physician Assistant

## 2019-07-07 ENCOUNTER — Encounter: Payer: Self-pay | Admitting: Physician Assistant

## 2019-07-07 VITALS — Ht 60.8 in | Wt 207.2 lb

## 2019-07-07 DIAGNOSIS — M19079 Primary osteoarthritis, unspecified ankle and foot: Secondary | ICD-10-CM

## 2019-07-07 DIAGNOSIS — I2510 Atherosclerotic heart disease of native coronary artery without angina pectoris: Secondary | ICD-10-CM | POA: Diagnosis not present

## 2019-07-07 MED ORDER — METHYLPREDNISOLONE ACETATE 40 MG/ML IJ SUSP
40.0000 mg | INTRAMUSCULAR | Status: AC | PRN
  Administered 2019-07-07: 16:00:00 40 mg via INTRA_ARTICULAR

## 2019-07-07 MED ORDER — LIDOCAINE HCL 1 % IJ SOLN
2.0000 mL | INTRAMUSCULAR | Status: AC | PRN
  Administered 2019-07-07: 16:00:00 2 mL
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
## 2019-07-15 ENCOUNTER — Other Ambulatory Visit: Payer: Self-pay | Admitting: Cardiology

## 2019-07-15 DIAGNOSIS — I495 Sick sinus syndrome: Secondary | ICD-10-CM

## 2019-07-15 MED ORDER — METOPROLOL SUCCINATE ER 25 MG PO TB24: 25.0000 mg | ORAL_TABLET | Freq: Every day | ORAL | 1 refills | Status: DC

## 2019-07-22 ENCOUNTER — Encounter: Payer: Self-pay | Admitting: Podiatry

## 2019-07-22 ENCOUNTER — Other Ambulatory Visit: Payer: Self-pay

## 2019-07-22 ENCOUNTER — Ambulatory Visit (INDEPENDENT_AMBULATORY_CARE_PROVIDER_SITE_OTHER): Payer: Medicare Other | Admitting: Podiatry

## 2019-07-22 VITALS — Temp 97.8°F

## 2019-07-22 DIAGNOSIS — M79675 Pain in left toe(s): Secondary | ICD-10-CM

## 2019-07-22 DIAGNOSIS — M79674 Pain in right toe(s): Secondary | ICD-10-CM | POA: Diagnosis not present

## 2019-07-22 DIAGNOSIS — B351 Tinea unguium: Secondary | ICD-10-CM

## 2019-07-22 NOTE — Patient Instructions (Signed)
Diabetes Mellitus and Foot Care Foot care is an important part of your health, especially when you have diabetes. Diabetes may cause you to have problems because of poor blood flow (circulation) to your feet and legs, which can cause your skin to:  Become thinner and drier.  Break more easily.  Heal more slowly.  Peel and crack. You may also have nerve damage (neuropathy) in your legs and feet, causing decreased feeling in them. This means that you may not notice minor injuries to your feet that could lead to more serious problems. Noticing and addressing any potential problems early is the best way to prevent future foot problems. How to care for your feet Foot hygiene  Wash your feet daily with warm water and mild soap. Do not use hot water. Then, pat your feet and the areas between your toes until they are completely dry. Do not soak your feet as this can dry your skin.  Trim your toenails straight across. Do not dig under them or around the cuticle. File the edges of your nails with an emery board or nail file.  Apply a moisturizing lotion or petroleum jelly to the skin on your feet and to dry, brittle toenails. Use lotion that does not contain alcohol and is unscented. Do not apply lotion between your toes. Shoes and socks  Wear clean socks or stockings every day. Make sure they are not too tight. Do not wear knee-high stockings since they may decrease blood flow to your legs.  Wear shoes that fit properly and have enough cushioning. Always look in your shoes before you put them on to be sure there are no objects inside.  To break in new shoes, wear them for just a few hours a day. This prevents injuries on your feet. Wounds, scrapes, corns, and calluses  Check your feet daily for blisters, cuts, bruises, sores, and redness. If you cannot see the bottom of your feet, use a mirror or ask someone for help.  Do not cut corns or calluses or try to remove them with medicine.  If you  find a minor scrape, cut, or break in the skin on your feet, keep it and the skin around it clean and dry. You may clean these areas with mild soap and water. Do not clean the area with peroxide, alcohol, or iodine.  If you have a wound, scrape, corn, or callus on your foot, look at it several times a day to make sure it is healing and not infected. Check for: ? Redness, swelling, or pain. ? Fluid or blood. ? Warmth. ? Pus or a bad smell. General instructions  Do not cross your legs. This may decrease blood flow to your feet.  Do not use heating pads or hot water bottles on your feet. They may burn your skin. If you have lost feeling in your feet or legs, you may not know this is happening until it is too late.  Protect your feet from hot and cold by wearing shoes, such as at the beach or on hot pavement.  Schedule a complete foot exam at least once a year (annually) or more often if you have foot problems. If you have foot problems, report any cuts, sores, or bruises to your health care provider immediately. Contact a health care provider if:  You have a medical condition that increases your risk of infection and you have any cuts, sores, or bruises on your feet.  You have an injury that is not   healing.  You have redness on your legs or feet.  You feel burning or tingling in your legs or feet.  You have pain or cramps in your legs and feet.  Your legs or feet are numb.  Your feet always feel cold.  You have pain around a toenail. Get help right away if:  You have a wound, scrape, corn, or callus on your foot and: ? You have pain, swelling, or redness that gets worse. ? You have fluid or blood coming from the wound, scrape, corn, or callus. ? Your wound, scrape, corn, or callus feels warm to the touch. ? You have pus or a bad smell coming from the wound, scrape, corn, or callus. ? You have a fever. ? You have a red line going up your leg. Summary  Check your feet every day  for cuts, sores, red spots, swelling, and blisters.  Moisturize feet and legs daily.  Wear shoes that fit properly and have enough cushioning.  If you have foot problems, report any cuts, sores, or bruises to your health care provider immediately.  Schedule a complete foot exam at least once a year (annually) or more often if you have foot problems. This information is not intended to replace advice given to you by your health care provider. Make sure you discuss any questions you have with your health care provider. Document Released: 11/10/2000 Document Revised: 12/26/2017 Document Reviewed: 12/15/2016 Elsevier Patient Education  2020 [employer]   Onychomycosis/Fungal Toenails  WHAT IS IT? An infection that lies within the keratin of your nail plate that is caused by a fungus.  WHY ME? Fungal infections affect all ages, sexes, races, and creeds.  There may be many factors that predispose you to a fungal infection such as age, coexisting medical conditions such as diabetes, or an autoimmune disease; stress, medications, fatigue, genetics, etc.  Bottom line: fungus thrives in a warm, moist environment and your shoes offer such a location.  IS IT CONTAGIOUS? Theoretically, yes.  You do not want to share shoes, nail clippers or files with someone who has fungal toenails.  Walking around barefoot in the same room or sleeping in the same bed is unlikely to transfer the organism.  It is important to realize, however, that fungus can spread easily from one nail to the next on the same foot.  HOW DO WE TREAT THIS?  There are several ways to treat this condition.  Treatment may depend on many factors such as age, medications, pregnancy, liver and kidney conditions, etc.  It is best to ask your doctor which options are available to you.  1. No treatment.   Unlike many other medical concerns, you can live with this condition.  However for many people this can be a painful condition and may lead to  ingrown toenails or a bacterial infection.  It is recommended that you keep the nails cut short to help reduce the amount of fungal nail. 2. Topical treatment.  These range from herbal remedies to prescription strength nail lacquers.  About 40-50% effective, topicals require twice daily application for approximately 9 to 12 months or until an entirely new nail has grown out.  The most effective topicals are medical grade medications available through physicians offices. 3. Oral antifungal medications.  With an 80-90% cure rate, the most common oral medication requires 3 to 4 months of therapy and stays in your system for a year as the new nail grows out.  Oral antifungal medications do require   blood work to make sure it is a safe drug for you.  A liver function panel will be performed prior to starting the medication and after the first month of treatment.  It is important to have the blood work performed to avoid any harmful side effects.  In general, this medication safe but blood work is required. 4. Laser Therapy.  This treatment is performed by applying a specialized laser to the affected nail plate.  This therapy is noninvasive, fast, and non-painful.  It is not covered by insurance and is therefore, out of pocket.  The results have been very good with a 80-95% cure rate.  [facility] is the only practice in the area to offer this therapy. 5. Permanent Nail Avulsion.  Removing the entire nail so that a new nail will not grow back. 

## 2019-07-31 NOTE — Progress Notes (Signed)
Subjective:  Jill Bowman presents to clinic today with cc of  painful, thick, discolored, elongated toenails 1-5 b/l that become tender and cannot cut because of thickness. Pain is aggravated when wearing enclosed shoe gear.  Patient states she has h/o left great toenail being infected.   [facility], Sunday Spillers, PA-C is her PCP.   Current Outpatient Medications:  .  acetaminophen (TYLENOL) 325 MG tablet, Take 650 mg by mouth daily as needed., Disp: , Rfl:  .  acidophilus (RISAQUAD) CAPS capsule, Take 1 capsule by mouth daily., Disp: 30 capsule, Rfl: 0 .  amiodarone (PACERONE) 200 MG tablet, TAKE 1/2 (ONE-HALF) TABLET BY MOUTH ONCE DAILY IN THE MORNING, Disp: 45 tablet, Rfl: 0 .  Ascorbic Acid (VITAMIN C) 1000 MG tablet, Take 1,000 mg by mouth daily., Disp: , Rfl:  .  aspirin 81 MG tablet, Take 81 mg by mouth daily., Disp: , Rfl:  .  calcium carbonate (OS-CAL) 600 MG TABS tablet, Take 600 mg by mouth daily with breakfast., Disp: , Rfl:  .  Cholecalciferol (VITAMIN D) 2000 UNITS CAPS, Take 2,000 Units by mouth daily., Disp: , Rfl:  .  cyclobenzaprine (FLEXERIL) 10 MG tablet, TAKE 1 TABLET (10 MG TOTAL) BY MOUTH TWICE DAILY AS NEEDED FOR MUSCLE SPASM, Disp: 60 tablet, Rfl: 0 .  diclofenac sodium (VOLTAREN) 1 % GEL, Apply 2 g topically 4 (four) times daily., Disp: 200 g, Rfl: 2 .  esomeprazole (NEXIUM) 20 MG capsule, take 1 capsule by oral route every day at least 1 hour before a meal swallowing whole. Do not crush or chew granules., Disp: 90 capsule, Rfl: 0 .  gabapentin (NEURONTIN) 300 MG capsule, TAKE ONE CAPSULE BY MOUTH TWICE DAILY NOON AND EVENING, Disp: 180 capsule, Rfl: 1 .  loratadine (CLARITIN) 10 MG tablet, Take 10 mg by mouth at bedtime. , Disp: , Rfl:  .  metoprolol succinate (TOPROL-XL) 25 MG 24 hr tablet, Take 1 tablet (25 mg total) by mouth daily. Take with or immediately following a meal., Disp: 90 tablet, Rfl: 1 .  Multiple Vitamin (MULTIVITAMIN WITH MINERALS) TABS,  Take 1 tablet by mouth daily., Disp: , Rfl:  .  nitroGLYCERIN (NITROSTAT) 0.4 MG SL tablet, Place 0.4 mg under the tongue every 5 (five) minutes as needed for chest pain., Disp: , Rfl:  .  nystatin (MYCOSTATIN/NYSTOP) powder, apply by topical route 2 times every day to the affected area(s), Disp: 60 g, Rfl: 0 .  pravastatin (PRAVACHOL) 80 MG tablet, TAKE ONE TABLET BY MOUTH EVERY DAY, Disp: 90 tablet, Rfl: 0 .  QUEtiapine (SEROQUEL) 25 MG tablet, Take 25 mg by mouth at bedtime., Disp: , Rfl: 1 .  senna (SENOKOT) 8.6 MG tablet, Take 1 tablet by mouth at bedtime. , Disp: , Rfl:  .  vitamin B-12 (CYANOCOBALAMIN) 1000 MCG tablet, Take 1,000 mcg by mouth daily., Disp: , Rfl:    Allergies  Allergen Reactions  . Codeine Itching, Rash and Other (See Comments)    Full body rash   . Hydroxyzine Other (See Comments)    Extreme confusion and hallucinations  . Lorazepam Other (See Comments)    Extreme confusion, hallucinations and hyperactivity  . Penicillins Anaphylaxis, Hives and Shortness Of Breath    Tolerated cefazolin and ceftriaxone in 2013  . Sulfa Antibiotics Shortness Of Breath  . Zinc Itching  . Ciprofloxacin Other (See Comments)    unknown  . Ciprofloxacin Hives and Other (See Comments)    "think I break out in welts"   . Latex  Rash and Other (See Comments)    Tears skin   . Penicillins Other (See Comments)    Unknown   . Sulfa Antibiotics Other (See Comments)    unknown     Objective: Vitals:   07/22/19 1050  Temp: 97.8 F (36.6 C)    Physical Examination:  Vascular Examination: Capillary refill time <3 seconds x 10 digits.  DP pulses 2/4 b/l.  PT pulses 1/4 b/l.  Digital hair present b/l.  No edema noted b/l.  Skin temperature gradient WNL b/l.  Dermatological Examination: Skin with normal turgor, texture and tone b/l.  No open wounds b/l.  No interdigital macerations noted b/l.  Elongated, thick, discolored brittle toenails with subungual debris and  pain on dorsal palpation of nailbeds 1-5 b/l.  Left great toe nailplate noted to be dystrophic with moderate amount of fungal hyperkeratotic debris noted medial and lateral nail borders. No erythema, no edema, no drainage, no subungual flocculence.  Musculoskeletal Examination: Muscle strength 5/5 to all muscle groups b/l.  No pain, crepitus or joint discomfort with active/passive ROM.  Neurological Examination: Sensation intact 5/5 b/l with 10 gram monofilament.  Vibratory sensation intact b/l.  Proprioceptive sensation intact b/l.  Assessment: Mycotic nail infection with pain 1-5 b/l  Plan: 1. Toenails 1-5 b/l were debrided in length and girth without iatrogenic laceration. Offending nail borders debrided and curretaged left hallux. Border cleansed with alcohol and tripe antibiotic applied. No further treatment required by patient. 2.  Continue soft, supportive shoe gear daily. 3.  Report any pedal injuries to medical professional. 4.  Follow up 3 months. 5.  Patient/POA to call should there be a question/concern in there interim.

## 2019-08-05 ENCOUNTER — Other Ambulatory Visit: Payer: Self-pay

## 2019-08-05 ENCOUNTER — Ambulatory Visit (INDEPENDENT_AMBULATORY_CARE_PROVIDER_SITE_OTHER): Payer: Medicare Other

## 2019-08-05 VITALS — BP 124/80 | HR 86 | Temp 98.1°F | Ht 60.4 in | Wt 213.8 lb

## 2019-08-05 DIAGNOSIS — Z23 Encounter for immunization: Secondary | ICD-10-CM | POA: Diagnosis not present

## 2019-08-05 NOTE — Progress Notes (Signed)
Patient presents today for a flu shot.  

## 2019-08-06 ENCOUNTER — Other Ambulatory Visit: Payer: Self-pay | Admitting: Nurse Practitioner

## 2019-08-07 ENCOUNTER — Other Ambulatory Visit: Payer: Self-pay | Admitting: Internal Medicine

## 2019-08-07 NOTE — Telephone Encounter (Signed)
Request

## 2019-08-11 ENCOUNTER — Other Ambulatory Visit: Payer: Self-pay

## 2019-08-11 DIAGNOSIS — E7800 Pure hypercholesterolemia, unspecified: Secondary | ICD-10-CM

## 2019-08-11 MED ORDER — PRAVASTATIN SODIUM 80 MG PO TABS: 80.0000 mg | ORAL_TABLET | Freq: Every day | ORAL | 3 refills | Status: DC

## 2019-08-25 ENCOUNTER — Other Ambulatory Visit: Payer: Self-pay

## 2019-08-25 MED ORDER — QUETIAPINE FUMARATE 25 MG PO TABS: 25.0000 mg | ORAL_TABLET | Freq: Every day | ORAL | 1 refills | Status: DC

## 2019-09-12 ENCOUNTER — Encounter: Payer: Self-pay | Admitting: Cardiology

## 2019-09-12 ENCOUNTER — Ambulatory Visit (INDEPENDENT_AMBULATORY_CARE_PROVIDER_SITE_OTHER): Payer: Medicare Other | Admitting: Cardiology

## 2019-09-12 ENCOUNTER — Other Ambulatory Visit: Payer: Self-pay

## 2019-09-12 VITALS — BP 133/57 | HR 70 | Temp 97.3°F | Ht 60.0 in | Wt 215.3 lb

## 2019-09-12 DIAGNOSIS — I495 Sick sinus syndrome: Secondary | ICD-10-CM | POA: Diagnosis not present

## 2019-09-12 DIAGNOSIS — Z45018 Encounter for adjustment and management of other part of cardiac pacemaker: Secondary | ICD-10-CM

## 2019-09-12 DIAGNOSIS — Z950 Presence of cardiac pacemaker: Secondary | ICD-10-CM

## 2019-09-12 DIAGNOSIS — Z8673 Personal history of transient ischemic attack (TIA), and cerebral infarction without residual deficits: Secondary | ICD-10-CM

## 2019-09-12 DIAGNOSIS — I4892 Unspecified atrial flutter: Secondary | ICD-10-CM | POA: Diagnosis not present

## 2019-09-12 DIAGNOSIS — I2510 Atherosclerotic heart disease of native coronary artery without angina pectoris: Secondary | ICD-10-CM

## 2019-09-12 HISTORY — DX: Encounter for adjustment and management of other part of cardiac pacemaker: Z45.018

## 2019-09-12 MED ORDER — RIVAROXABAN 20 MG PO TABS: 20.0000 mg | ORAL_TABLET | Freq: Every day | ORAL | 6 refills | Status: DC

## 2019-09-12 NOTE — Telephone Encounter (Signed)
Error
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
## 2019-09-22 ENCOUNTER — Other Ambulatory Visit: Payer: Self-pay | Admitting: Nurse Practitioner

## 2019-09-25 ENCOUNTER — Ambulatory Visit: Payer: Medicare Other | Admitting: Internal Medicine

## 2019-10-02 ENCOUNTER — Ambulatory Visit (INDEPENDENT_AMBULATORY_CARE_PROVIDER_SITE_OTHER): Payer: Medicare Other

## 2019-10-02 ENCOUNTER — Encounter: Payer: Self-pay | Admitting: Internal Medicine

## 2019-10-02 ENCOUNTER — Ambulatory Visit (INDEPENDENT_AMBULATORY_CARE_PROVIDER_SITE_OTHER): Payer: Medicare Other | Admitting: Internal Medicine

## 2019-10-02 ENCOUNTER — Other Ambulatory Visit: Payer: Self-pay

## 2019-10-02 VITALS — BP 124/72 | HR 64 | Temp 97.6°F | Ht 61.0 in | Wt 210.0 lb

## 2019-10-02 DIAGNOSIS — G301 Alzheimer's disease with late onset: Secondary | ICD-10-CM | POA: Diagnosis not present

## 2019-10-02 DIAGNOSIS — Z0001 Encounter for general adult medical examination with abnormal findings: Secondary | ICD-10-CM

## 2019-10-02 DIAGNOSIS — E2839 Other primary ovarian failure: Secondary | ICD-10-CM

## 2019-10-02 DIAGNOSIS — R82998 Other abnormal findings in urine: Secondary | ICD-10-CM

## 2019-10-02 DIAGNOSIS — R7309 Other abnormal glucose: Secondary | ICD-10-CM

## 2019-10-02 DIAGNOSIS — Z1211 Encounter for screening for malignant neoplasm of colon: Secondary | ICD-10-CM | POA: Diagnosis not present

## 2019-10-02 DIAGNOSIS — R413 Other amnesia: Secondary | ICD-10-CM

## 2019-10-02 DIAGNOSIS — G4440 Drug-induced headache, not elsewhere classified, not intractable: Secondary | ICD-10-CM

## 2019-10-02 DIAGNOSIS — Z0000 Encounter for general adult medical examination without abnormal findings: Secondary | ICD-10-CM

## 2019-10-02 DIAGNOSIS — I10 Essential (primary) hypertension: Secondary | ICD-10-CM | POA: Diagnosis not present

## 2019-10-02 DIAGNOSIS — F0280 Dementia in other diseases classified elsewhere without behavioral disturbance: Secondary | ICD-10-CM

## 2019-10-02 DIAGNOSIS — T887XXA Unspecified adverse effect of drug or medicament, initial encounter: Secondary | ICD-10-CM

## 2019-10-02 LAB — POCT URINALYSIS DIPSTICK
Bilirubin, UA: NEGATIVE
Blood, UA: NEGATIVE
Glucose, UA: NEGATIVE
Nitrite, UA: NEGATIVE
Protein, UA: NEGATIVE
Spec Grav, UA: >=1.03 — AB (ref 1.010–1.025)
Urobilinogen, UA: 0.2 E.U./dL
pH, UA: 5.5 (ref 5.0–8.0)

## 2019-10-02 LAB — POC HEMOCCULT BLD/STL (OFFICE/1-CARD/DIAGNOSTIC): Fecal Occult Blood, POC: NEGATIVE

## 2019-10-02 MED ORDER — TETANUS-DIPHTH-ACELL PERTUSSIS 5-2-15.5 LF-MCG/0.5 IM SUSP: 0.5000 mL | Freq: Once | INTRAMUSCULAR | 0 refills | Status: AC

## 2019-10-02 MED ORDER — PNEUMOCOCCAL 13-VAL CONJ VACC IM SUSP: 0.5000 mL | INTRAMUSCULAR | 0 refills | Status: AC

## 2019-10-02 NOTE — Patient Instructions (Signed)
[name] , Thank you for taking time to come for your Medicare Wellness Visit. I appreciate your ongoing commitment to your health goals. Please review the following plan we discussed and let me know if I can assist you in the future.   Screening recommendations/referrals: Colonoscopy: not required Mammogram: not required Bone Density: due Recommended yearly ophthalmology/optometry visit for glaucoma screening and checkup Recommended yearly dental visit for hygiene and checkup  Vaccinations: Influenza vaccine: 07/2019 Pneumococcal vaccine: 03/2018 Tdap vaccine: 07/2018 Shingles vaccine: discussed    Advanced directives: Please bring a copy of your POA (Power of [place]) and/or Living Will to your next appointment.    Conditions/risks identified: obesity  Next appointment: 02/05/2020 at 11:45   Preventive Care 65 Years and Older, Female Preventive care refers to lifestyle choices and visits with your health care provider that can promote health and wellness. What does preventive care include?  A yearly physical exam. This is also called an annual well check.  Dental exams once or twice a year.  Routine eye exams. Ask your health care provider how often you should have your eyes checked.  Personal lifestyle choices, including:  Daily care of your teeth and gums.  Regular physical activity.  Eating a healthy diet.  Avoiding tobacco and drug use.  Limiting alcohol use.  Practicing safe sex.  Taking low-dose aspirin every day.  Taking vitamin and mineral supplements as recommended by your health care provider. What happens during an annual well check? The services and screenings done by your health care provider during your annual well check will depend on your age, overall health, lifestyle risk factors, and family history of disease. Counseling  Your health care provider may ask you questions about your:  Alcohol use.  Tobacco use.  Drug use.  Emotional  well-being.  Home and relationship well-being.  Sexual activity.  Eating habits.  History of falls.  Memory and ability to understand (cognition).  Work and work Statistician.  Reproductive health. Screening  You may have the following tests or measurements:  Height, weight, and BMI.  Blood pressure.  Lipid and cholesterol levels. These may be checked every 5 years, or more frequently if you are over 26 years old.  Skin check.  Lung cancer screening. You may have this screening every year starting at age 62 if you have a 30-pack-year history of smoking and currently smoke or have quit within the past 15 years.  Fecal occult blood test (FOBT) of the stool. You may have this test every year starting at age 50.  Flexible sigmoidoscopy or colonoscopy. You may have a sigmoidoscopy every 5 years or a colonoscopy every 10 years starting at age 57.  Hepatitis C blood test.  Hepatitis B blood test.  Sexually transmitted disease (STD) testing.  Diabetes screening. This is done by checking your blood sugar (glucose) after you have not eaten for a while (fasting). You may have this done every 1-3 years.  Bone density scan. This is done to screen for osteoporosis. You may have this done starting at age 72.  Mammogram. This may be done every 1-2 years. Talk to your health care provider about how often you should have regular mammograms. Talk with your health care provider about your test results, treatment options, and if necessary, the need for more tests. Vaccines  Your health care provider may recommend certain vaccines, such as:  Influenza vaccine. This is recommended every year.  Tetanus, diphtheria, and acellular pertussis (Tdap, Td) vaccine. You may need a  Td booster every 10 years.  Zoster vaccine. You may need this after age 75.  Pneumococcal 13-valent conjugate (PCV13) vaccine. One dose is recommended after age 83.  Pneumococcal polysaccharide (PPSV23) vaccine. One  dose is recommended after age 47. Talk to your health care provider about which screenings and vaccines you need and how often you need them. This information is not intended to replace advice given to you by your health care provider. Make sure you discuss any questions you have with your health care provider. Document Released: 12/10/2015 Document Revised: 08/02/2016 Document Reviewed: 09/14/2015 Elsevier Interactive Patient Education  2017 [place] Prevention in the Home Falls can cause injuries. They can happen to people of all ages. There are many things you can do to make your home safe and to help prevent falls. What can I do on the outside of my home?  Regularly fix the edges of walkways and driveways and fix any cracks.  Remove anything that might make you trip as you walk through a door, such as a raised step or threshold.  Trim any bushes or trees on the path to your home.  Use bright outdoor lighting.  Clear any walking paths of anything that might make someone trip, such as rocks or tools.  Regularly check to see if handrails are loose or broken. Make sure that both sides of any steps have handrails.  Any raised decks and porches should have guardrails on the edges.  Have any leaves, snow, or ice cleared regularly.  Use sand or salt on walking paths during winter.  Clean up any spills in your garage right away. This includes oil or grease spills. What can I do in the bathroom?  Use night lights.  Install grab bars by the toilet and in the tub and shower. Do not use towel bars as grab bars.  Use non-skid mats or decals in the tub or shower.  If you need to sit down in the shower, use a plastic, non-slip stool.  Keep the floor dry. Clean up any water that spills on the floor as soon as it happens.  Remove soap buildup in the tub or shower regularly.  Attach bath mats securely with double-sided non-slip rug tape.  Do not have throw rugs and other  things on the floor that can make you trip. What can I do in the bedroom?  Use night lights.  Make sure that you have a light by your bed that is easy to reach.  Do not use any sheets or blankets that are too big for your bed. They should not hang down onto the floor.  Have a firm chair that has side arms. You can use this for support while you get dressed.  Do not have throw rugs and other things on the floor that can make you trip. What can I do in the kitchen?  Clean up any spills right away.  Avoid walking on wet floors.  Keep items that you use a lot in easy-to-reach places.  If you need to reach something above you, use a strong step stool that has a grab bar.  Keep electrical cords out of the way.  Do not use floor polish or wax that makes floors slippery. If you must use wax, use non-skid floor wax.  Do not have throw rugs and other things on the floor that can make you trip. What can I do with my stairs?  Do not leave any items on the stairs.  Make sure that there are handrails on both sides of the stairs and use them. Fix handrails that are broken or loose. Make sure that handrails are as long as the stairways.  Check any carpeting to make sure that it is firmly attached to the stairs. Fix any carpet that is loose or worn.  Avoid having throw rugs at the top or bottom of the stairs. If you do have throw rugs, attach them to the floor with carpet tape.  Make sure that you have a light switch at the top of the stairs and the bottom of the stairs. If you do not have them, ask someone to add them for you. What else can I do to help prevent falls?  Wear shoes that:  Do not have high heels.  Have rubber bottoms.  Are comfortable and fit you well.  Are closed at the toe. Do not wear sandals.  If you use a stepladder:  Make sure that it is fully opened. Do not climb a closed stepladder.  Make sure that both sides of the stepladder are locked into place.  Ask  someone to hold it for you, if possible.  Clearly mark and make sure that you can see:  Any grab bars or handrails.  First and last steps.  Where the edge of each step is.  Use tools that help you move around (mobility aids) if they are needed. These include:  Canes.  Walkers.  Scooters.  Crutches.  Turn on the lights when you go into a dark area. Replace any light bulbs as soon as they burn out.  Set up your furniture so you have a clear path. Avoid moving your furniture around.  If any of your floors are uneven, fix them.  If there are any pets around you, be aware of where they are.  Review your medicines with your doctor. Some medicines can make you feel dizzy. This can increase your chance of falling. Ask your doctor what other things that you can do to help prevent falls. This information is not intended to replace advice given to you by your health care provider. Make sure you discuss any questions you have with your health care provider. Document Released: 09/09/2009 Document Revised: 04/20/2016 Document Reviewed: 12/18/2014 Elsevier Interactive Patient Education  2017 [employer].

## 2019-10-02 NOTE — Addendum Note (Signed)
Addended by: Nicki Guadalajara on: 10/02/2019 04:50 PM   Modules accepted: Orders
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
## 2019-10-02 NOTE — Patient Instructions (Signed)
Please call Dr Irven Shelling office and inform him about her having Headaches since taking new medication.     Preventive Care 55 Years and Older, Female Preventive care refers to lifestyle choices and visits with your health care provider that can promote health and wellness. This includes:  A yearly physical exam. This is also called an annual well check.  Regular dental and eye exams.  Immunizations.  Screening for certain conditions.  Healthy lifestyle choices, such as diet and exercise. What can I expect for my preventive care visit? Physical exam Your health care provider will check:  Height and weight. These may be used to calculate body mass index (BMI), which is a measurement that tells if you are at a healthy weight.  Heart rate and blood pressure.  Your skin for abnormal spots. Counseling Your health care provider may ask you questions about:  Alcohol, tobacco, and drug use.  Emotional well-being.  Home and relationship well-being.  Sexual activity.  Eating habits.  History of falls.  Memory and ability to understand (cognition).  Work and work Statistician.  Pregnancy and menstrual history. What immunizations do I need?  Influenza (flu) vaccine  This is recommended every year. Tetanus, diphtheria, and pertussis (Tdap) vaccine  You may need a Td booster every 10 years. Varicella (chickenpox) vaccine  You may need this vaccine if you have not already been vaccinated. Zoster (shingles) vaccine  You may need this after age 56. Pneumococcal conjugate (PCV13) vaccine  One dose is recommended after age 54. Pneumococcal polysaccharide (PPSV23) vaccine  One dose is recommended after age 69. Measles, mumps, and rubella (MMR) vaccine  You may need at least one dose of MMR if you were born in 1957 or later. You may also need a second dose. Meningococcal conjugate (MenACWY) vaccine  You may need this if you have certain conditions. Hepatitis A vaccine   You may need this if you have certain conditions or if you travel or work in places where you may be exposed to hepatitis A. Hepatitis B vaccine  You may need this if you have certain conditions or if you travel or work in places where you may be exposed to hepatitis B. Haemophilus influenzae type b (Hib) vaccine  You may need this if you have certain conditions. You may receive vaccines as individual doses or as more than one vaccine together in one shot (combination vaccines). Talk with your health care provider about the risks and benefits of combination vaccines. What tests do I need? Blood tests  Lipid and cholesterol levels. These may be checked every 5 years, or more frequently depending on your overall health.  Hepatitis C test.  Hepatitis B test. Screening  Lung cancer screening. You may have this screening every year starting at age 95 if you have a 30-pack-year history of smoking and currently smoke or have quit within the past 15 years.  Colorectal cancer screening. All adults should have this screening starting at age 2 and continuing until age 52. Your health care provider may recommend screening at age 32 if you are at increased risk. You will have tests every 1-10 years, depending on your results and the type of screening test.  Diabetes screening. This is done by checking your blood sugar (glucose) after you have not eaten for a while (fasting). You may have this done every 1-3 years.  Mammogram. This may be done every 1-2 years. Talk with your health care provider about how often you should have regular mammograms.  BRCA-related cancer screening. This may be done if you have a family history of breast, ovarian, tubal, or peritoneal cancers. Other tests  Sexually transmitted disease (STD) testing.  Bone density scan. This is done to screen for osteoporosis. You may have this done starting at age 19. Follow these instructions at home: Eating and drinking  Eat a  diet that includes fresh fruits and vegetables, whole grains, lean protein, and low-fat dairy products. Limit your intake of foods with high amounts of sugar, saturated fats, and salt.  Take vitamin and mineral supplements as recommended by your health care provider.  Do not drink alcohol if your health care provider tells you not to drink.  If you drink alcohol: ? Limit how much you have to 0-1 drink a day. ? Be aware of how much alcohol is in your drink. In the [place] one drink equals one 12 oz bottle of beer (355 mL), one 5 oz glass of wine (148 mL), or one 1 oz glass of hard liquor (44 mL). Lifestyle  Take daily care of your teeth and gums.  Stay active. Exercise for at least 30 minutes on 5 or more days each week.  Do not use any products that contain nicotine or tobacco, such as cigarettes, e-cigarettes, and chewing tobacco. If you need help quitting, ask your health care provider.  If you are sexually active, practice safe sex. Use a condom or other form of protection in order to prevent STIs (sexually transmitted infections).  Talk with your health care provider about taking a low-dose aspirin or statin. What's next?  Go to your health care provider once a year for a well check visit.  Ask your health care provider how often you should have your eyes and teeth checked.  Stay up to date on all vaccines. This information is not intended to replace advice given to you by your health care provider. Make sure you discuss any questions you have with your health care provider. Document Released: 12/10/2015 Document Revised: 11/07/2018 Document Reviewed: 11/07/2018 Elsevier Patient Education  2020 [employer].

## 2019-10-03 LAB — CMP14 + ANION GAP
ALT: 13 [IU]/L (ref 0–32)
AST: 27 [IU]/L (ref 0–40)
Albumin/Globulin Ratio: 1.6 (ref 1.2–2.2)
Albumin: 4.2 g/dL (ref 3.6–4.6)
Alkaline Phosphatase: 119 [IU]/L — ABNORMAL HIGH (ref 39–117)
Anion Gap: 13 mmol/L (ref 10.0–18.0)
BUN/Creatinine Ratio: 20 (ref 12–28)
BUN: 24 mg/dL (ref 8–27)
Bilirubin Total: 0.2 mg/dL (ref 0.0–1.2)
CO2: 24 mmol/L (ref 20–29)
Calcium: 9.4 mg/dL (ref 8.7–10.3)
Chloride: 104 mmol/L (ref 96–106)
Creatinine, Ser: 1.18 mg/dL — ABNORMAL HIGH (ref 0.57–1.00)
GFR calc Af Amer: 48 mL/min/{1.73_m2} — ABNORMAL LOW
GFR calc non Af Amer: 41 mL/min/{1.73_m2} — ABNORMAL LOW
Globulin, Total: 2.7 g/dL (ref 1.5–4.5)
Glucose: 83 mg/dL (ref 65–99)
Potassium: 4.7 mmol/L (ref 3.5–5.2)
Sodium: 141 mmol/L (ref 134–144)
Total Protein: 6.9 g/dL (ref 6.0–8.5)

## 2019-10-03 LAB — CBC WITH DIFFERENTIAL/PLATELET
Basophils Absolute: 0 10*3/uL (ref 0.0–0.2)
Basos: 0 %
EOS (ABSOLUTE): 0.2 10*3/uL (ref 0.0–0.4)
Eos: 2 %
Hematocrit: 40.6 % (ref 34.0–46.6)
Hemoglobin: 13.4 g/dL (ref 11.1–15.9)
Immature Grans (Abs): 0 10*3/uL (ref 0.0–0.1)
Immature Granulocytes: 0 %
Lymphocytes Absolute: 2.3 10*3/uL (ref 0.7–3.1)
Lymphs: 25 %
MCH: 32.2 pg (ref 26.6–33.0)
MCHC: 33 g/dL (ref 31.5–35.7)
MCV: 98 fL — ABNORMAL HIGH (ref 79–97)
Monocytes Absolute: 0.6 10*3/uL (ref 0.1–0.9)
Monocytes: 7 %
Neutrophils Absolute: 6.4 10*3/uL (ref 1.4–7.0)
Neutrophils: 66 %
Platelets: 238 10*3/uL (ref 150–450)
RBC: 4.16 x10E6/uL (ref 3.77–5.28)
RDW: 13.3 % (ref 11.7–15.4)
WBC: 9.6 10*3/uL (ref 3.4–10.8)

## 2019-10-03 LAB — B12 AND FOLATE PANEL
Folate: >20 ng/mL (ref >3.0)
Vitamin B-12: 1725 pg/mL — ABNORMAL HIGH (ref 232–1245)

## 2019-10-03 LAB — TSH: TSH: 2.11 u[IU]/mL (ref 0.450–4.500)

## 2019-10-03 LAB — HEMOGLOBIN A1C
Est. average glucose Bld gHb Est-mCnc: 117 mg/dL
Hgb A1c MFr Bld: 5.7 % — ABNORMAL HIGH (ref 4.8–5.6)

## 2019-10-04 LAB — URINE CULTURE: Organism ID, Bacteria: NO GROWTH

## 2019-10-06 ENCOUNTER — Telehealth: Payer: Self-pay

## 2019-10-06 NOTE — Telephone Encounter (Signed)
Pt daughter called in regards to pt having headaches and daughter mention could it be due to the new medication she has been on for the past 2 weeks. The new medication is Xarelto 20mg . Pt has been taking tylenol for the headaches she wants to know is that ok to take. Please advise thank you

## 2019-10-06 NOTE — Telephone Encounter (Signed)
Okay to take,. Rare Xarelto to cause headache unless she is having dizziness and also visual disturbances or gait disturbances. Let us know if it is getting worse

## 2019-10-07 NOTE — Telephone Encounter (Signed)
The patients daughter called and said that this morning she was on the toilet and the patient was unresponsive and making gurgling sounds, with her eyes rolled . The daughter says she was like this for a few minutes and is now back to normal. She thinks she may have had another TIA, please advise

## 2019-10-07 NOTE — Telephone Encounter (Signed)
With frequent headache and unresponsiveness, she should see her PCP ASAP

## 2019-10-09 ENCOUNTER — Encounter: Payer: Self-pay | Admitting: Internal Medicine

## 2019-10-09 NOTE — Telephone Encounter (Signed)
S/w pt's daughter advised her to see PCP

## 2019-10-09 NOTE — Telephone Encounter (Signed)
Tried calling daughter cell but VM not set up to leave message

## 2019-10-15 ENCOUNTER — Other Ambulatory Visit: Payer: Self-pay

## 2019-10-15 MED ORDER — AMIODARONE HCL 200 MG PO TABS: ORAL_TABLET | ORAL | 0 refills | Status: DC

## 2019-10-21 ENCOUNTER — Encounter: Payer: Self-pay | Admitting: Podiatry

## 2019-10-21 ENCOUNTER — Other Ambulatory Visit: Payer: Self-pay

## 2019-10-21 ENCOUNTER — Ambulatory Visit (INDEPENDENT_AMBULATORY_CARE_PROVIDER_SITE_OTHER): Payer: Medicare Other | Admitting: Podiatry

## 2019-10-21 DIAGNOSIS — M79675 Pain in left toe(s): Secondary | ICD-10-CM

## 2019-10-21 DIAGNOSIS — B351 Tinea unguium: Secondary | ICD-10-CM

## 2019-10-21 DIAGNOSIS — M79674 Pain in right toe(s): Secondary | ICD-10-CM

## 2019-10-25 NOTE — Progress Notes (Signed)
Subjective: Jill Bowman presents today with painful, thick toenails 1-5 b/l that she cannot cut and which interfere with daily activities.  Pain is aggravated when wearing enclosed shoe gear.   Current Outpatient Medications on File Prior to Visit  Medication Sig Dispense Refill  . acetaminophen (TYLENOL) 325 MG tablet Take 650 mg by mouth daily as needed.    Marland Kitchen acidophilus (RISAQUAD) CAPS capsule Take 1 capsule by mouth daily. 30 capsule 0  . amiodarone (PACERONE) 200 MG tablet TAKE 1/2 (ONE-HALF) TABLET BY MOUTH ONCE DAILY IN THE MORNING 45 tablet 0  . Ascorbic Acid (VITAMIN C) 1000 MG tablet Take 1,000 mg by mouth daily.    . calcium carbonate (OS-CAL) 600 MG TABS tablet Take 600 mg by mouth daily with breakfast.    . Cholecalciferol (VITAMIN D) 2000 UNITS CAPS Take 2,000 Units by mouth daily.    . diclofenac sodium (VOLTAREN) 1 % GEL Apply 2 g topically 4 (four) times daily. 200 g 2  . esomeprazole (NEXIUM) 20 MG capsule take 1 capsule by oral route every day at least 1 hour before a meal swallowing whole. Do not crush or chew granules. 90 capsule 0  . gabapentin (NEURONTIN) 300 MG capsule TAKE ONE CAPSULE BY MOUTH TWICE DAILY NOON AND EVENING 180 capsule 1  . loratadine (CLARITIN) 10 MG tablet Take 10 mg by mouth at bedtime.     . metoprolol succinate (TOPROL-XL) 25 MG 24 hr tablet Take 1 tablet (25 mg total) by mouth daily. Take with or immediately following a meal. 90 tablet 1  . Multiple Vitamin (MULTIVITAMIN WITH MINERALS) TABS Take 1 tablet by mouth daily.    . nitroGLYCERIN (NITROSTAT) 0.4 MG SL tablet Place 0.4 mg under the tongue every 5 (five) minutes as needed for chest pain.    Marland Kitchen nystatin (MYCOSTATIN/NYSTOP) powder apply by topical route 2 times every day to the affected area(s) 60 g 0  . pravastatin (PRAVACHOL) 80 MG tablet Take 1 tablet (80 mg total) by mouth daily. 90 tablet 3  . QUEtiapine (SEROQUEL) 25 MG tablet Take 1 tablet (25 mg total) by mouth at bedtime. Prescribed  by Karle Starch 30 tablet 1  . rivaroxaban (XARELTO) 20 MG TABS tablet Take 1 tablet (20 mg total) by mouth daily with supper. 30 tablet 6  . senna (SENOKOT) 8.6 MG tablet Take 1 tablet by mouth at bedtime.     . vitamin B-12 (CYANOCOBALAMIN) 1000 MCG tablet Take 1,000 mcg by mouth daily.     No current facility-administered medications on file prior to visit.     Allergies  Allergen Reactions  . Codeine Itching, Rash and Other (See Comments)    Full body rash   . Hydroxyzine Other (See Comments)    Extreme confusion and hallucinations  . Lorazepam Other (See Comments)    Extreme confusion, hallucinations and hyperactivity  . Penicillins Anaphylaxis, Hives and Shortness Of Breath    Tolerated cefazolin and ceftriaxone in 2013  . Sulfa Antibiotics Shortness Of Breath  . Zinc Itching  . Ciprofloxacin Other (See Comments)    unknown  . Ciprofloxacin Hives and Other (See Comments)    "think I break out in welts"   . Latex Rash and Other (See Comments)    Tears skin   . Penicillins Other (See Comments)    Unknown   . Sulfa Antibiotics Other (See Comments)    unknown    Objective: There were no vitals filed for this visit.  Vascular Examination: Capillary refill time <  3 seconds b/l.  Dorsalis pedis pulses palpable b/l.   Posterior tibial pulses faintly palpable b/l  Digital hair present b/l.   Skin temperature gradient WNL b/l.  Dermatological Examination: Skin with normal turgor, texture and tone b/l.  Toenails 1-5 b/l discolored, thick, dystrophic with subungual debris and pain with palpation to nailbeds due to thickness of nails.  Musculoskeletal: Muscle strength 5/5 to all LE muscle groups b/l.   No gross bony deformities b/l.  No pain, crepitus or joint limitation noted with ROM.   Neurological: Sensation intact 5/5 b/l with 10 gram monofilament.  Vibratory sensation intact.  Assessment: Painful onychomycosis toenails 1-5 b/l   Plan: 1. Toenails 1-5 b/l  were debrided in length and girth without iatrogenic bleeding. 2. Patient to continue soft, supportive shoe gear daily. 3. Patient to report any pedal injuries to medical professional immediately. 4. Follow up 3 months.  5. Patient/POA to call should there be a concern in the interim.

## 2019-12-15 DIAGNOSIS — Z45018 Encounter for adjustment and management of other part of cardiac pacemaker: Secondary | ICD-10-CM

## 2019-12-15 DIAGNOSIS — Z950 Presence of cardiac pacemaker: Secondary | ICD-10-CM

## 2019-12-15 DIAGNOSIS — I495 Sick sinus syndrome: Secondary | ICD-10-CM

## 2019-12-19 ENCOUNTER — Other Ambulatory Visit: Payer: Self-pay | Admitting: Internal Medicine

## 2019-12-27 ENCOUNTER — Ambulatory Visit: Payer: No Typology Code available for payment source

## 2020-01-01 ENCOUNTER — Ambulatory Visit: Payer: Medicare Other | Admitting: Neurology

## 2020-01-08 ENCOUNTER — Other Ambulatory Visit: Payer: Self-pay | Admitting: Internal Medicine

## 2020-01-12 ENCOUNTER — Other Ambulatory Visit: Payer: Self-pay | Admitting: Cardiology

## 2020-01-12 DIAGNOSIS — I495 Sick sinus syndrome: Secondary | ICD-10-CM

## 2020-01-27 ENCOUNTER — Ambulatory Visit: Payer: Medicare Other | Admitting: Sports Medicine

## 2020-02-05 ENCOUNTER — Ambulatory Visit: Payer: Medicare Other | Admitting: Internal Medicine

## 2020-02-12 ENCOUNTER — Ambulatory Visit: Payer: Medicare Other | Admitting: Internal Medicine

## 2020-02-19 ENCOUNTER — Other Ambulatory Visit: Payer: Self-pay

## 2020-02-19 MED ORDER — QUETIAPINE FUMARATE 25 MG PO TABS: 25.0000 mg | ORAL_TABLET | Freq: Every day | ORAL | 0 refills | Status: DC

## 2020-02-26 ENCOUNTER — Ambulatory Visit (INDEPENDENT_AMBULATORY_CARE_PROVIDER_SITE_OTHER): Payer: Medicare Other | Admitting: Internal Medicine

## 2020-02-26 ENCOUNTER — Other Ambulatory Visit: Payer: Self-pay

## 2020-02-26 ENCOUNTER — Encounter: Payer: Self-pay | Admitting: Internal Medicine

## 2020-02-26 VITALS — BP 132/84 | HR 76 | Temp 97.1°F | Ht 61.0 in | Wt 225.4 lb

## 2020-02-26 DIAGNOSIS — G301 Alzheimer's disease with late onset: Secondary | ICD-10-CM

## 2020-02-26 DIAGNOSIS — R748 Abnormal levels of other serum enzymes: Secondary | ICD-10-CM | POA: Diagnosis not present

## 2020-02-26 DIAGNOSIS — E538 Deficiency of other specified B group vitamins: Secondary | ICD-10-CM

## 2020-02-26 DIAGNOSIS — Z6841 Body Mass Index (BMI) 40.0 and over, adult: Secondary | ICD-10-CM

## 2020-02-26 DIAGNOSIS — I471 Supraventricular tachycardia: Secondary | ICD-10-CM

## 2020-02-26 DIAGNOSIS — Z79899 Other long term (current) drug therapy: Secondary | ICD-10-CM | POA: Diagnosis not present

## 2020-02-26 DIAGNOSIS — F0280 Dementia in other diseases classified elsewhere without behavioral disturbance: Secondary | ICD-10-CM

## 2020-02-26 DIAGNOSIS — E782 Mixed hyperlipidemia: Secondary | ICD-10-CM | POA: Diagnosis not present

## 2020-02-26 DIAGNOSIS — R300 Dysuria: Secondary | ICD-10-CM

## 2020-02-26 LAB — POCT URINALYSIS DIPSTICK
Bilirubin, UA: NEGATIVE
Blood, UA: NEGATIVE
Glucose, UA: NEGATIVE
Ketones, UA: NEGATIVE
Nitrite, UA: NEGATIVE
Protein, UA: POSITIVE — AB
Spec Grav, UA: >=1.03 — AB (ref 1.010–1.025)
Urobilinogen, UA: 0.2 E.U./dL
pH, UA: 5.5 (ref 5.0–8.0)

## 2020-02-26 MED ORDER — TETANUS-DIPHTHERIA TOXOIDS TD 5-2 LFU IM INJ: 0.5000 mL | INJECTION | Freq: Once | INTRAMUSCULAR | 0 refills | Status: AC

## 2020-02-26 MED ORDER — CEPHALEXIN 250 MG PO CAPS: 250.0000 mg | ORAL_CAPSULE | Freq: Three times a day (TID) | ORAL | 0 refills | Status: DC
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
## 2020-02-27 LAB — VITAMIN B12: Vitamin B-12: 1040 pg/mL (ref 232–1245)

## 2020-02-27 LAB — LIPID PANEL
Chol/HDL Ratio: 2.7 ratio (ref 0.0–4.4)
Cholesterol, Total: 114 mg/dL (ref 100–199)
HDL: 42 mg/dL (ref >39)
LDL Chol Calc (NIH): 47 mg/dL (ref 0–99)
Triglycerides: 142 mg/dL (ref 0–149)
VLDL Cholesterol Cal: 25 mg/dL (ref 5–40)

## 2020-02-27 LAB — CMP14 + ANION GAP
ALT: 11 IU/L (ref 0–32)
AST: 19 IU/L (ref 0–40)
Albumin/Globulin Ratio: 1.2 (ref 1.2–2.2)
Albumin: 3.6 g/dL (ref 3.6–4.6)
Alkaline Phosphatase: 124 IU/L — ABNORMAL HIGH (ref 39–117)
Anion Gap: 14 mmol/L (ref 10.0–18.0)
BUN/Creatinine Ratio: 12 (ref 12–28)
BUN: 14 mg/dL (ref 8–27)
Bilirubin Total: 0.3 mg/dL (ref 0.0–1.2)
CO2: 24 mmol/L (ref 20–29)
Calcium: 9 mg/dL (ref 8.7–10.3)
Chloride: 105 mmol/L (ref 96–106)
Creatinine, Ser: 1.15 mg/dL — ABNORMAL HIGH (ref 0.57–1.00)
GFR calc Af Amer: 49 mL/min/{1.73_m2} — ABNORMAL LOW (ref >59)
GFR calc non Af Amer: 43 mL/min/{1.73_m2} — ABNORMAL LOW (ref >59)
Globulin, Total: 2.9 g/dL (ref 1.5–4.5)
Glucose: 83 mg/dL (ref 65–99)
Potassium: 4.7 mmol/L (ref 3.5–5.2)
Sodium: 143 mmol/L (ref 134–144)
Total Protein: 6.5 g/dL (ref 6.0–8.5)

## 2020-02-27 LAB — CBC
Hematocrit: 37.8 % (ref 34.0–46.6)
Hemoglobin: 12.8 g/dL (ref 11.1–15.9)
MCH: 33.7 pg — ABNORMAL HIGH (ref 26.6–33.0)
MCHC: 33.9 g/dL (ref 31.5–35.7)
MCV: 100 fL — ABNORMAL HIGH (ref 79–97)
Platelets: 196 10*3/uL (ref 150–450)
RBC: 3.8 x10E6/uL (ref 3.77–5.28)
RDW: 12.6 % (ref 11.7–15.4)
WBC: 6.2 10*3/uL (ref 3.4–10.8)

## 2020-02-27 LAB — TSH: TSH: 2.99 u[IU]/mL (ref 0.450–4.500)

## 2020-02-27 LAB — T4, FREE: Free T4: 1.16 ng/dL (ref 0.82–1.77)

## 2020-02-27 LAB — T3, FREE: T3, Free: 2.7 pg/mL (ref 2.0–4.4)

## 2020-02-29 LAB — URINE CULTURE

## 2020-03-01 ENCOUNTER — Ambulatory Visit (INDEPENDENT_AMBULATORY_CARE_PROVIDER_SITE_OTHER): Payer: Medicare Other | Admitting: Physician Assistant

## 2020-03-01 ENCOUNTER — Other Ambulatory Visit: Payer: Self-pay

## 2020-03-01 ENCOUNTER — Encounter: Payer: Self-pay | Admitting: Physician Assistant

## 2020-03-01 DIAGNOSIS — M7662 Achilles tendinitis, left leg: Secondary | ICD-10-CM

## 2020-03-01 DIAGNOSIS — M722 Plantar fascial fibromatosis: Secondary | ICD-10-CM
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
## 2020-03-05 ENCOUNTER — Other Ambulatory Visit: Payer: Self-pay | Admitting: Internal Medicine

## 2020-03-05 DIAGNOSIS — E559 Vitamin D deficiency, unspecified: Secondary | ICD-10-CM

## 2020-03-07 NOTE — Progress Notes (Signed)
I called her to explain and left a voice mail, yes I want her to be on B12 twice a week.

## 2020-03-12 ENCOUNTER — Ambulatory Visit: Payer: Medicare Other | Admitting: Cardiology

## 2020-03-17 ENCOUNTER — Other Ambulatory Visit: Payer: Self-pay

## 2020-03-17 ENCOUNTER — Encounter: Payer: Self-pay | Admitting: Podiatry

## 2020-03-17 ENCOUNTER — Ambulatory Visit (INDEPENDENT_AMBULATORY_CARE_PROVIDER_SITE_OTHER): Payer: Medicare Other | Admitting: Podiatry

## 2020-03-17 VITALS — Temp 97.0°F

## 2020-03-17 DIAGNOSIS — M79675 Pain in left toe(s): Secondary | ICD-10-CM

## 2020-03-17 DIAGNOSIS — M79674 Pain in right toe(s): Secondary | ICD-10-CM

## 2020-03-17 DIAGNOSIS — B351 Tinea unguium: Secondary | ICD-10-CM | POA: Diagnosis not present

## 2020-03-17 NOTE — Patient Instructions (Addendum)

## 2020-03-21 NOTE — Progress Notes (Signed)
Subjective: Jill Bowman presents today for follow up of painful mycotic nails b/l that are difficult to trim. Pain interferes with ambulation. Aggravating factors include wearing enclosed shoe gear. Pain is relieved with periodic professional debridement.   Allergies  Allergen Reactions  . Codeine Itching, Rash and Other (See Comments)    Full body rash   . Hydroxyzine Other (See Comments)    Extreme confusion and hallucinations  . Lorazepam Other (See Comments)    Extreme confusion, hallucinations and hyperactivity  . Penicillins Anaphylaxis, Hives and Shortness Of Breath    Tolerated cefazolin and ceftriaxone in 2013  . Sulfa Antibiotics Shortness Of Breath  . Zinc Itching  . Ciprofloxacin Other (See Comments)    unknown  . Ciprofloxacin Hives and Other (See Comments)    "think I break out in welts"   . Latex Rash and Other (See Comments)    Tears skin   . Penicillins Other (See Comments)    Unknown   . Sulfa Antibiotics Other (See Comments)    unknown     Objective: Vitals:   03/17/20 1323  Temp: (!) 71 F (36.1 C)    Pt is a pleasant 86 y.o. year old AA female WD, WN in NAD. AAO x 3.   Vascular Examination:  Capillary fill time to digits <3 seconds b/l. Palpable DP pulses b/l. Faintly palpable PT pulses b/l. Pedal hair present b/l. Skin temperature gradient within normal limits b/l.  Dermatological Examination: Pedal skin with normal turgor, texture and tone bilaterally. No open wounds bilaterally. No interdigital macerations bilaterally. Toenails 1-5 b/l elongated, dystrophic, thickened, crumbly with subungual debris and tenderness to dorsal palpation.  Musculoskeletal: Normal muscle strength 5/5 to all lower extremity muscle groups bilaterally. No gross bony deformities bilaterally. No pain crepitus or joint limitation noted with ROM b/l.  Neurological: Protective sensation intact 5/5 intact bilaterally with 10g monofilament b/l. Vibratory sensation intact  b/l. Proprioception intact bilaterally. Babinski reflex negative b/l. Clonus negative b/l.  Assessment: 1. Pain due to onychomycosis of toenails of both feet    Plan: -Toenails 1-5 b/l were debrided in length and girth with sterile nail nippers and dremel without iatrogenic bleeding.  -Patient to continue soft, supportive shoe gear daily. -Patient to report any pedal injuries to medical professional immediately. -Patient/POA to call should there be question/concern in the interim.  Return in about 3 months (around 06/16/2020) for nail trim /  Xarelto.
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
## 2020-03-25 ENCOUNTER — Other Ambulatory Visit: Payer: Self-pay

## 2020-03-25 ENCOUNTER — Encounter: Payer: Self-pay | Admitting: Cardiology

## 2020-03-25 ENCOUNTER — Ambulatory Visit: Payer: Medicare Other | Admitting: Cardiology

## 2020-03-25 VITALS — BP 149/58 | HR 59 | Temp 97.7°F | Ht 61.0 in | Wt 224.0 lb

## 2020-03-25 DIAGNOSIS — Z950 Presence of cardiac pacemaker: Secondary | ICD-10-CM

## 2020-03-25 DIAGNOSIS — I2510 Atherosclerotic heart disease of native coronary artery without angina pectoris: Secondary | ICD-10-CM

## 2020-03-25 DIAGNOSIS — I10 Essential (primary) hypertension: Secondary | ICD-10-CM

## 2020-03-25 DIAGNOSIS — I484 Atypical atrial flutter: Secondary | ICD-10-CM

## 2020-03-25 DIAGNOSIS — I495 Sick sinus syndrome: Secondary | ICD-10-CM

## 2020-03-25 DIAGNOSIS — E7800 Pure hypercholesterolemia, unspecified: Secondary | ICD-10-CM

## 2020-03-25 MED ORDER — AMLODIPINE BESYLATE 5 MG PO TABS: 5.0000 mg | ORAL_TABLET | Freq: Every day | ORAL | 3 refills | Status: DC

## 2020-03-30 ENCOUNTER — Other Ambulatory Visit: Payer: Self-pay | Admitting: Internal Medicine

## 2020-04-07 ENCOUNTER — Other Ambulatory Visit: Payer: Self-pay | Admitting: Cardiology

## 2020-04-07 DIAGNOSIS — I4892 Unspecified atrial flutter: Secondary | ICD-10-CM

## 2020-04-12 ENCOUNTER — Ambulatory Visit (INDEPENDENT_AMBULATORY_CARE_PROVIDER_SITE_OTHER): Payer: Medicare Other | Admitting: Neurology

## 2020-04-12 ENCOUNTER — Encounter: Payer: Self-pay | Admitting: Neurology

## 2020-04-12 ENCOUNTER — Other Ambulatory Visit: Payer: Self-pay

## 2020-04-12 VITALS — BP 121/54 | HR 74 | Temp 96.9°F | Ht 64.0 in | Wt 223.0 lb

## 2020-04-12 DIAGNOSIS — F22 Delusional disorders: Secondary | ICD-10-CM | POA: Diagnosis not present

## 2020-04-12 DIAGNOSIS — I2510 Atherosclerotic heart disease of native coronary artery without angina pectoris: Secondary | ICD-10-CM

## 2020-04-12 DIAGNOSIS — F0390 Unspecified dementia without behavioral disturbance: Secondary | ICD-10-CM | POA: Diagnosis not present

## 2020-04-12 DIAGNOSIS — G459 Transient cerebral ischemic attack, unspecified: Secondary | ICD-10-CM

## 2020-04-12 DIAGNOSIS — F0281 Dementia in other diseases classified elsewhere with behavioral disturbance: Secondary | ICD-10-CM

## 2020-04-12 DIAGNOSIS — F02818 Dementia in other diseases classified elsewhere, unspecified severity, with other behavioral disturbance: Secondary | ICD-10-CM | POA: Insufficient documentation

## 2020-04-12 DIAGNOSIS — F05 Delirium due to known physiological condition: Secondary | ICD-10-CM

## 2020-04-12 MED ORDER — DONEPEZIL HCL 5 MG PO TABS: 5.0000 mg | ORAL_TABLET | Freq: Every day | ORAL | 3 refills | Status: DC

## 2020-04-12 MED ORDER — QUETIAPINE FUMARATE 50 MG PO TABS: 50.0000 mg | ORAL_TABLET | Freq: Every day | ORAL | 3 refills | Status: DC
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
## 2020-04-12 NOTE — Patient Instructions (Signed)
Quetiapine tablets What is this medicine? QUETIAPINE (kwe TYE a peen) is an antipsychotic. It is used to treat schizophrenia and bipolar disorder, also known as manic-depression. This medicine may be used for other purposes; ask your health care provider or pharmacist if you have questions. COMMON BRAND NAME(S): Seroquel What should I tell my health care provider before I take this medicine? They need to know if you have any of these conditions:  blockage in your bowel  cataracts  constipation  dehydration  diabetes  difficulty swallowing  glaucoma  heart disease  history of breast cancer  kidney disease  liver disease  low blood counts, like low white cell, platelet, or red cell counts  low blood pressure or dizziness when standing up  Parkinson's disease  previous heart attack  prostate disease  seizures  stomach or intestine problems  suicidal thoughts, plans or attempt; a previous suicide attempt by you or a family member  thyroid disease  trouble passing urine  an unusual or allergic reaction to quetiapine, other medicines, foods, dyes, or preservatives  pregnant or trying to get pregnant  breast-feeding How should I use this medicine? Take this medicine by mouth. Swallow it with a drink of water. Follow the directions on the prescription label. If it upsets your stomach you can take it with food. Take your medicine at regular intervals. Do not take it more often than directed. Do not stop taking except on the advice of your doctor or health care professional. A special MedGuide will be given to you by the pharmacist with each prescription and refill. Be sure to read this information carefully each time. Talk to your pediatrician regarding the use of this medicine in children. While this drug may be prescribed for children as young as 10 years for selected conditions, precautions do apply. Patients over age 65 years may have a stronger reaction to this  medicine and need smaller doses. Overdosage: If you think you have taken too much of this medicine contact a poison control center or emergency room at once. NOTE: This medicine is only for you. Do not share this medicine with others. What if I miss a dose? If you miss a dose, take it as soon as you can. If it is almost time for your next dose, take only that dose. Do not take double or extra doses. What may interact with this medicine? Do not take this medicine with any of the following medications:  cisapride  dronedarone  fluconazole  metoclopramide  pimozide  posaconazole  thioridazine This medicine may also interact with the following medications:  alcohol  antihistamines for allergy cough and cold  antiviral medicines for HIV or AIDS  atropine  certain medicines for bladder problems like oxybutynin, tolterodine  certain medicines for blood pressure  certain medicines for depression, anxiety, or psychotic disturbances  certain medicines for diabetes  certain medicines for stomach problems like dicyclomine, hyoscyamine  certain medicines for travel sickness like scopolamine  certain medicines for Parkinson's disease  certain medicines for seizures like carbamazepine, phenobarbital, phenytoin  cimetidine  erythromycin  ipratropium  other medicines that prolong the QT interval (cause an abnormal heart rhythm) like dofetilide  rifampin  steroid medicines like prednisone or cortisone This list may not describe all possible interactions. Give your health care provider a list of all the medicines, herbs, non-prescription drugs, or dietary supplements you use. Also tell them if you smoke, drink alcohol, or use illegal drugs. Some items may interact with your medicine. What   should I watch for while using this medicine? Visit your health care professional for regular checks on your progress. Tell your health care professional if symptoms do not start to get  better or if they get worse. Do not stop taking except on your health care professional's advice. You may develop a severe reaction. Your health care professional will tell you how much medicine to take. You may need to have an eye exam before and during use of this medicine. This medicine may increase blood sugar. Ask your health care provider if changes in diet or medicines are needed if you have diabetes. Patients and their families should watch out for new or worsening depression or thoughts of suicide. Also watch out for sudden or severe changes in feelings such as feeling anxious, agitated, panicky, irritable, hostile, aggressive, impulsive, severely restless, overly excited and hyperactive, or not being able to sleep. If this happens, especially at the beginning of antidepressant treatment or after a change in dose, call your health care professional. You may get dizzy or drowsy. Do not drive, use machinery, or do anything that needs mental alertness until you know how this medicine affects you. Do not stand or sit up quickly, especially if you are an older patient. This reduces the risk of dizzy or fainting spells. Alcohol may interfere with the effect of this medicine. Avoid alcoholic drinks. This drug can cause problems with controlling your body temperature. It can lower the response of your body to cold temperatures. If possible, stay indoors during cold weather. If you must go outdoors, wear warm clothes. It can also lower the response of your body to heat. Do not overheat. Do not over-exercise. Stay out of the sun when possible. If you must be in the sun, wear cool clothing. Drink plenty of water. If you have trouble controlling your body temperature, call your health care provider right away. What side effects may I notice from receiving this medicine? Side effects that you should report to your doctor or health care professional as soon as possible:  allergic reactions like skin rash,  itching or hives, swelling of the face, lips, or tongue  breathing problems  changes in vision  confusion  elevated mood, decreased need for sleep, racing thoughts, impulsive behavior  eye pain  fast, irregular heartbeat  fever or chills, sore throat  inability to keep still  males: prolonged or painful erection  problems with balance, talking, walking  redness, blistering, peeling, or loosening of the skin, including inside the mouth  seizures  signs and symptoms of high blood sugar such as being more thirsty or hungry or having to urinate more than normal. You may also feel very tired or have blurry vision  signs and symptoms of hypothyroidism like fatigue; increased sensitivity to cold; weight gain; hoarseness; thinning hair  signs and symptoms of low blood pressure like dizziness; feeling faint or lightheaded; falls; unusually weak or tired  signs and symptoms of neuroleptic malignant syndrome like confusion; fast, irregular heartbeat; high fever; increased sweating; stiff muscles  sudden numbness or weakness of the face, arm, or leg  suicidal thoughts or other mood changes  trouble swallowing  uncontrollable movements of the arms, face, head, mouth, neck, or upper body Side effects that usually do not require medical attention (report to your doctor or health care professional if they continue or are bothersome):  change in sex drive or performance  constipation  drowsiness  dry mouth  upset stomach  weight gain This list may   not describe all possible side effects. Call your doctor for medical advice about side effects. You may report side effects to FDA at 1-800-FDA-1088. Where should I keep my medicine? Keep out of the reach of children. Store at room temperature between 15 and 30 degrees C (59 and 86 degrees F). Throw away any unused medicine after the expiration date. NOTE: This sheet is a summary. It may not cover all possible information. If you  have questions about this medicine, talk to your doctor, pharmacist, or health care provider.  2020 Elsevier/Gold Standard (2019-09-10 11:59:11)   ARICEPT 5 mg   What is this medicine? DONEPEZIL (doe NEP e zil) is used to treat mild to moderate dementia caused by Alzheimer's disease. This medicine may be used for other purposes; ask your health care provider or pharmacist if you have questions. COMMON BRAND NAME(S): Aricept What should I tell my health care provider before I take this medicine? They need to know if you have any of these conditions:  asthma or other lung disease  difficulty passing urine  head injury  heart disease  history of irregular heartbeat  liver disease  seizures (convulsions)  stomach or intestinal disease, ulcers or stomach bleeding  an unusual or allergic reaction to donepezil, other medicines, foods, dyes, or preservatives  pregnant or trying to get pregnant  breast-feeding How should I use this medicine? Take this medicine by mouth. Follow the directions on the prescription label. Use a specially marked spoon or container to measure your dose. Ask your pharmacist if you do not have one. Household spoons are not accurate. You may take this medicine with or without food. Take your doses at regular intervals, usually before bedtime. Do not take your medicine more often than directed. Continue to take your medicine even if you feel better. Do not stop taking except on the advice of your doctor or health care professional. Talk to your pediatrician regarding the use of this medicine in children. Special care may be needed. Overdosage: If you think you have taken too much of this medicine contact a poison control center or emergency room at once. NOTE: This medicine is only for you. Do not share this medicine with others. What if I miss a dose? If you miss a dose, take it as soon as you can. If it is almost time for your next dose, take only that dose,  do not take double or extra doses. What may interact with this medicine? Do not take this medicine with any of the following medications:  certain medicines for fungal infections like itraconazole, fluconazole, posaconazole, and voriconazole  cisapride  dextromethorphan; quinidine  dronedarone  pimozide  quinidine  thioridazine This medicine may also interact with the following medications:  antihistamines for allergy, cough and cold  atropine  bethanechol  carbamazepine  certain medicines for bladder problems like oxybutynin, tolterodine  certain medicines for Parkinson's disease like benztropine, trihexyphenidyl  certain medicines for stomach problems like dicyclomine, hyoscyamine  certain medicines for travel sickness like scopolamine  dexamethasone  dofetilide  ipratropium  NSAIDs, medicines for pain and inflammation, like ibuprofen or naproxen  other medicines for Alzheimer's disease  other medicines that prolong the QT interval (cause an abnormal heart rhythm)  phenobarbital  phenytoin  rifampin, rifabutin or rifapentine  ziprasidone This list may not describe all possible interactions. Give your health care provider a list of all the medicines, herbs, non-prescription drugs, or dietary supplements you use. Also tell them if you smoke, drink alcohol, or  use illegal drugs. Some items may interact with your medicine. What should I watch for while using this medicine? Visit your doctor or health care professional for regular checks on your progress. Check with your doctor or health care professional if your symptoms do not get better or if they get worse. You may get drowsy or dizzy. Do not drive, use machinery, or do anything that needs mental alertness until you know how this drug affects you. What side effects may I notice from receiving this medicine? Side effects that you should report to your doctor or health care professional as soon as  possible:  allergic reactions like skin rash, itching or hives, swelling of the face, lips, or tongue  feeling faint or lightheaded, falls  loss of bladder control  seizures  signs and symptoms of a dangerous change in heartbeat or heart rhythm like chest pain; dizziness; fast or irregular heartbeat; palpitations; feeling faint or lightheaded, falls; breathing problems  signs and symptoms of infection like fever or chills; cough; sore throat; pain or trouble passing urine  signs and symptoms of liver injury like dark yellow or brown urine; general ill feeling or flu-like symptoms; light-colored stools; loss of appetite; nausea; right upper belly pain; unusually weak or tired; yellowing of the eyes or skin  slow heartbeat or palpitations  unusual bleeding or bruising  vomiting Side effects that usually do not require medical attention (report to your doctor or health care professional if they continue or are bothersome):  diarrhea, especially when starting treatment  headache  loss of appetite  muscle cramps  nausea  stomach upset This list may not describe all possible side effects. Call your doctor for medical advice about side effects. You may report side effects to FDA at 1-800-FDA-1088. Where should I keep my medicine? Keep out of reach of children. Store at room temperature between 15 and 30 degrees C (59 and 86 degrees F). Throw away any unused medicine after the expiration date. NOTE: This sheet is a summary. It may not cover all possible information. If you have questions about this medicine, talk to your doctor, pharmacist, or health care provider.  2020 Elsevier/Gold Standard (2018-11-04 10:28:56)

## 2020-04-13 ENCOUNTER — Other Ambulatory Visit: Payer: Self-pay | Admitting: Internal Medicine

## 2020-04-19 ENCOUNTER — Encounter: Payer: Self-pay | Admitting: Physician Assistant

## 2020-04-19 ENCOUNTER — Other Ambulatory Visit: Payer: Self-pay

## 2020-04-19 ENCOUNTER — Telehealth: Payer: Self-pay

## 2020-04-19 ENCOUNTER — Ambulatory Visit (INDEPENDENT_AMBULATORY_CARE_PROVIDER_SITE_OTHER): Payer: Medicare Other | Admitting: Physician Assistant

## 2020-04-19 DIAGNOSIS — M722 Plantar fascial fibromatosis: Secondary | ICD-10-CM | POA: Diagnosis not present

## 2020-04-19 MED ORDER — METHYLPREDNISOLONE ACETATE 40 MG/ML IJ SUSP
40.0000 mg | INTRAMUSCULAR | Status: AC | PRN
  Administered 2020-04-19: 40 mg

## 2020-04-19 MED ORDER — LIDOCAINE HCL 1 % IJ SOLN
1.0000 mL | INTRAMUSCULAR | Status: AC | PRN
  Administered 2020-04-19: 1 mL

## 2020-04-19 NOTE — Progress Notes (Signed)
   Procedure Note  Patient: Jill Bowman             Date of Birth: 1929-09-09           MRN: [id number]             Visit Date: 04/19/2020 HPI: Jill Bowman comes in today for right Achilles injection.  Again she did recently have a Covid vaccine second injection and we needed to wait at least 2 weeks prior to injecting the Achilles.  Left Achilles pain has improved.  Physical exam: Right foot tenderness over the medial tubercle of the calcaneus.  Left Achilles nontender along the Achilles and at the Achilles insertion.  Procedures: Visit Diagnoses:  1. Plantar fasciitis of right foot     Foot Inj  Date/Time: 04/19/2020 3:37 PM Performed by: Pete Pelt, PA-C Authorized by: Pete Pelt, PA-C   Indications:  Fasciitis Condition: Plantar Fasciitis   Location: right plantar fascia muscle   Needle Size:  22 G Approach:  Plantar Medications:  1 mL lidocaine 1 %; 40 mg methylPREDNISolone acetate 40 MG/ML    Plan: Given all of her physical therapy she defers.  She will perform gastrocsoleus stretching exercises shown.  Apply Voltaren gel to the heel and Achilles as needed.  Questions encouraged and answered.  Follow-up as needed.

## 2020-04-19 NOTE — Telephone Encounter (Signed)
Pt's daughter called and wanted to know if its ok for pt to take donepezil (ARICEPT) 5 MG tablet, she was prescribed this by her Neurologist.

## 2020-04-19 NOTE — Telephone Encounter (Signed)
Yes. Try to message on My Chart=========== also

## 2020-04-25 ENCOUNTER — Other Ambulatory Visit: Payer: Self-pay

## 2020-04-25 ENCOUNTER — Inpatient Hospital Stay (HOSPITAL_COMMUNITY)
Admission: EM | Admit: 2020-04-25 | Discharge: 2020-05-06 | DRG: 389 | Disposition: A | Discharge status: Skilled Nursing Facility | Payer: Medicare Other | Attending: Internal Medicine | Admitting: Internal Medicine

## 2020-04-25 DIAGNOSIS — I471 Supraventricular tachycardia: Secondary | ICD-10-CM | POA: Diagnosis present

## 2020-04-25 DIAGNOSIS — Z8673 Personal history of transient ischemic attack (TIA), and cerebral infarction without residual deficits: Secondary | ICD-10-CM

## 2020-04-25 DIAGNOSIS — Z950 Presence of cardiac pacemaker: Secondary | ICD-10-CM

## 2020-04-25 DIAGNOSIS — E1122 Type 2 diabetes mellitus with diabetic chronic kidney disease: Secondary | ICD-10-CM | POA: Diagnosis present

## 2020-04-25 DIAGNOSIS — I5032 Chronic diastolic (congestive) heart failure: Secondary | ICD-10-CM | POA: Diagnosis present

## 2020-04-25 DIAGNOSIS — Z6838 Body mass index (BMI) 38.0-38.9, adult: Secondary | ICD-10-CM

## 2020-04-25 DIAGNOSIS — Z87440 Personal history of urinary (tract) infections: Secondary | ICD-10-CM

## 2020-04-25 DIAGNOSIS — Z8249 Family history of ischemic heart disease and other diseases of the circulatory system: Secondary | ICD-10-CM

## 2020-04-25 DIAGNOSIS — N1831 Chronic kidney disease, stage 3a: Secondary | ICD-10-CM | POA: Diagnosis present

## 2020-04-25 DIAGNOSIS — G8929 Other chronic pain: Secondary | ICD-10-CM | POA: Diagnosis present

## 2020-04-25 DIAGNOSIS — Z882 Allergy status to sulfonamides status: Secondary | ICD-10-CM

## 2020-04-25 DIAGNOSIS — E119 Type 2 diabetes mellitus without complications: Secondary | ICD-10-CM

## 2020-04-25 DIAGNOSIS — Z881 Allergy status to other antibiotic agents status: Secondary | ICD-10-CM

## 2020-04-25 DIAGNOSIS — E785 Hyperlipidemia, unspecified: Secondary | ICD-10-CM | POA: Diagnosis present

## 2020-04-25 DIAGNOSIS — F0280 Dementia in other diseases classified elsewhere without behavioral disturbance: Secondary | ICD-10-CM | POA: Diagnosis present

## 2020-04-25 DIAGNOSIS — M25562 Pain in left knee: Secondary | ICD-10-CM | POA: Diagnosis present

## 2020-04-25 DIAGNOSIS — I2510 Atherosclerotic heart disease of native coronary artery without angina pectoris: Secondary | ICD-10-CM | POA: Diagnosis present

## 2020-04-25 DIAGNOSIS — Z955 Presence of coronary angioplasty implant and graft: Secondary | ICD-10-CM

## 2020-04-25 DIAGNOSIS — K567 Ileus, unspecified: Principal | ICD-10-CM | POA: Diagnosis present

## 2020-04-25 DIAGNOSIS — Z801 Family history of malignant neoplasm of trachea, bronchus and lung: Secondary | ICD-10-CM

## 2020-04-25 DIAGNOSIS — Z7901 Long term (current) use of anticoagulants: Secondary | ICD-10-CM

## 2020-04-25 DIAGNOSIS — G301 Alzheimer's disease with late onset: Secondary | ICD-10-CM | POA: Diagnosis present

## 2020-04-25 DIAGNOSIS — Z87891 Personal history of nicotine dependence: Secondary | ICD-10-CM

## 2020-04-25 DIAGNOSIS — I130 Hypertensive heart and chronic kidney disease with heart failure and stage 1 through stage 4 chronic kidney disease, or unspecified chronic kidney disease: Secondary | ICD-10-CM | POA: Diagnosis present

## 2020-04-25 DIAGNOSIS — Z20822 Contact with and (suspected) exposure to covid-19: Secondary | ICD-10-CM | POA: Diagnosis present

## 2020-04-25 DIAGNOSIS — Z888 Allergy status to other drugs, medicaments and biological substances status: Secondary | ICD-10-CM

## 2020-04-25 DIAGNOSIS — N183 Chronic kidney disease, stage 3 unspecified: Secondary | ICD-10-CM | POA: Diagnosis present

## 2020-04-25 DIAGNOSIS — R1084 Generalized abdominal pain: Secondary | ICD-10-CM

## 2020-04-25 DIAGNOSIS — M25569 Pain in unspecified knee: Secondary | ICD-10-CM

## 2020-04-25 DIAGNOSIS — Z90710 Acquired absence of both cervix and uterus: Secondary | ICD-10-CM

## 2020-04-25 DIAGNOSIS — K219 Gastro-esophageal reflux disease without esophagitis: Secondary | ICD-10-CM | POA: Diagnosis present

## 2020-04-25 DIAGNOSIS — I071 Rheumatic tricuspid insufficiency: Secondary | ICD-10-CM | POA: Diagnosis present

## 2020-04-25 DIAGNOSIS — Z885 Allergy status to narcotic agent status: Secondary | ICD-10-CM

## 2020-04-25 DIAGNOSIS — Z96653 Presence of artificial knee joint, bilateral: Secondary | ICD-10-CM | POA: Diagnosis present

## 2020-04-25 DIAGNOSIS — Z9049 Acquired absence of other specified parts of digestive tract: Secondary | ICD-10-CM

## 2020-04-25 DIAGNOSIS — I480 Paroxysmal atrial fibrillation: Secondary | ICD-10-CM | POA: Diagnosis present

## 2020-04-25 DIAGNOSIS — F02818 Dementia in other diseases classified elsewhere, unspecified severity, with other behavioral disturbance: Secondary | ICD-10-CM | POA: Diagnosis present

## 2020-04-25 DIAGNOSIS — M1712 Unilateral primary osteoarthritis, left knee: Secondary | ICD-10-CM | POA: Diagnosis present

## 2020-04-25 DIAGNOSIS — M25462 Effusion, left knee: Secondary | ICD-10-CM | POA: Diagnosis present

## 2020-04-25 DIAGNOSIS — Z79899 Other long term (current) drug therapy: Secondary | ICD-10-CM

## 2020-04-25 NOTE — ED Triage Notes (Signed)
PER EMS: Patient is coming from home with c/o bilateral lower abdominal pain for the past 2 hours. Pt denies having N/V/D. Abdomen is rigid and tender. Has a hx of diverticultis and bowel obstructions. Hx Alzheimer's.  EMS VITALS: BP 144/80 HR 78 RR 18 SPO2 96% RA TEMP 97.0 CBG 147

## 2020-04-26 ENCOUNTER — Encounter (HOSPITAL_COMMUNITY): Payer: Self-pay

## 2020-04-26 ENCOUNTER — Emergency Department (HOSPITAL_COMMUNITY): Payer: Medicare Other

## 2020-04-26 DIAGNOSIS — Z9049 Acquired absence of other specified parts of digestive tract: Secondary | ICD-10-CM | POA: Diagnosis not present

## 2020-04-26 DIAGNOSIS — Z950 Presence of cardiac pacemaker: Secondary | ICD-10-CM | POA: Diagnosis not present

## 2020-04-26 DIAGNOSIS — K567 Ileus, unspecified: Principal | ICD-10-CM

## 2020-04-26 DIAGNOSIS — K219 Gastro-esophageal reflux disease without esophagitis: Secondary | ICD-10-CM | POA: Diagnosis present

## 2020-04-26 DIAGNOSIS — K56609 Unspecified intestinal obstruction, unspecified as to partial versus complete obstruction: Secondary | ICD-10-CM | POA: Insufficient documentation

## 2020-04-26 DIAGNOSIS — E1122 Type 2 diabetes mellitus with diabetic chronic kidney disease: Secondary | ICD-10-CM | POA: Diagnosis present

## 2020-04-26 DIAGNOSIS — I2510 Atherosclerotic heart disease of native coronary artery without angina pectoris: Secondary | ICD-10-CM | POA: Diagnosis present

## 2020-04-26 DIAGNOSIS — E119 Type 2 diabetes mellitus without complications: Secondary | ICD-10-CM

## 2020-04-26 DIAGNOSIS — I471 Supraventricular tachycardia: Secondary | ICD-10-CM | POA: Diagnosis present

## 2020-04-26 DIAGNOSIS — Z90710 Acquired absence of both cervix and uterus: Secondary | ICD-10-CM | POA: Diagnosis not present

## 2020-04-26 DIAGNOSIS — Z885 Allergy status to narcotic agent status: Secondary | ICD-10-CM | POA: Diagnosis not present

## 2020-04-26 DIAGNOSIS — N1831 Chronic kidney disease, stage 3a: Secondary | ICD-10-CM | POA: Diagnosis present

## 2020-04-26 DIAGNOSIS — M1712 Unilateral primary osteoarthritis, left knee: Secondary | ICD-10-CM | POA: Diagnosis present

## 2020-04-26 DIAGNOSIS — I130 Hypertensive heart and chronic kidney disease with heart failure and stage 1 through stage 4 chronic kidney disease, or unspecified chronic kidney disease: Secondary | ICD-10-CM | POA: Diagnosis present

## 2020-04-26 DIAGNOSIS — N183 Chronic kidney disease, stage 3 unspecified: Secondary | ICD-10-CM | POA: Diagnosis present

## 2020-04-26 DIAGNOSIS — I5032 Chronic diastolic (congestive) heart failure: Secondary | ICD-10-CM | POA: Diagnosis present

## 2020-04-26 DIAGNOSIS — I480 Paroxysmal atrial fibrillation: Secondary | ICD-10-CM | POA: Diagnosis present

## 2020-04-26 DIAGNOSIS — Z882 Allergy status to sulfonamides status: Secondary | ICD-10-CM | POA: Diagnosis not present

## 2020-04-26 DIAGNOSIS — Z888 Allergy status to other drugs, medicaments and biological substances status: Secondary | ICD-10-CM | POA: Diagnosis not present

## 2020-04-26 DIAGNOSIS — Z96653 Presence of artificial knee joint, bilateral: Secondary | ICD-10-CM | POA: Diagnosis present

## 2020-04-26 DIAGNOSIS — Z87440 Personal history of urinary (tract) infections: Secondary | ICD-10-CM | POA: Diagnosis not present

## 2020-04-26 DIAGNOSIS — F0281 Dementia in other diseases classified elsewhere with behavioral disturbance: Secondary | ICD-10-CM | POA: Diagnosis not present

## 2020-04-26 DIAGNOSIS — Z20822 Contact with and (suspected) exposure to covid-19: Secondary | ICD-10-CM | POA: Diagnosis present

## 2020-04-26 DIAGNOSIS — E785 Hyperlipidemia, unspecified: Secondary | ICD-10-CM | POA: Diagnosis present

## 2020-04-26 DIAGNOSIS — Z881 Allergy status to other antibiotic agents status: Secondary | ICD-10-CM | POA: Diagnosis not present

## 2020-04-26 DIAGNOSIS — G8929 Other chronic pain: Secondary | ICD-10-CM | POA: Diagnosis present

## 2020-04-26 DIAGNOSIS — Z87891 Personal history of nicotine dependence: Secondary | ICD-10-CM | POA: Diagnosis not present

## 2020-04-26 LAB — CBC WITH DIFFERENTIAL/PLATELET
Abs Immature Granulocytes: 0.02 10*3/uL (ref 0.00–0.07)
Abs Immature Granulocytes: 0.04 10*3/uL (ref 0.00–0.07)
Basophils Absolute: 0 10*3/uL (ref 0.0–0.1)
Basophils Absolute: 0 10*3/uL (ref 0.0–0.1)
Basophils Relative: 0 %
Basophils Relative: 0 %
Eosinophils Absolute: 0.1 10*3/uL (ref 0.0–0.5)
Eosinophils Absolute: 0.1 10*3/uL (ref 0.0–0.5)
Eosinophils Relative: 1 %
Eosinophils Relative: 2 %
HCT: 37.9 % (ref 36.0–46.0)
HCT: 41 % (ref 36.0–46.0)
Hemoglobin: 12.3 g/dL (ref 12.0–15.0)
Hemoglobin: 13.6 g/dL (ref 12.0–15.0)
Immature Granulocytes: 0 %
Immature Granulocytes: 0 %
Lymphocytes Relative: 19 %
Lymphocytes Relative: 25 %
Lymphs Abs: 2.1 10*3/uL (ref 0.7–4.0)
Lymphs Abs: 2.2 10*3/uL (ref 0.7–4.0)
MCH: 32.6 pg (ref 26.0–34.0)
MCH: 33.5 pg (ref 26.0–34.0)
MCHC: 32.5 g/dL (ref 30.0–36.0)
MCHC: 33.2 g/dL (ref 30.0–36.0)
MCV: 100.5 fL — ABNORMAL HIGH (ref 80.0–100.0)
MCV: 101 fL — ABNORMAL HIGH (ref 80.0–100.0)
Monocytes Absolute: 0.7 10*3/uL (ref 0.1–1.0)
Monocytes Absolute: 0.8 10*3/uL (ref 0.1–1.0)
Monocytes Relative: 7 %
Monocytes Relative: 9 %
Neutro Abs: 5.5 10*3/uL (ref 1.7–7.7)
Neutro Abs: 8.5 10*3/uL — ABNORMAL HIGH (ref 1.7–7.7)
Neutrophils Relative %: 64 %
Neutrophils Relative %: 73 %
Platelets: 221 10*3/uL (ref 150–400)
Platelets: 238 10*3/uL (ref 150–400)
RBC: 3.77 MIL/uL — ABNORMAL LOW (ref 3.87–5.11)
RBC: 4.06 MIL/uL (ref 3.87–5.11)
RDW: 12.7 % (ref 11.5–15.5)
RDW: 12.7 % (ref 11.5–15.5)
WBC: 11.8 10*3/uL — ABNORMAL HIGH (ref 4.0–10.5)
WBC: 8.5 10*3/uL (ref 4.0–10.5)
nRBC: 0 % (ref 0.0–0.2)
nRBC: 0 % (ref 0.0–0.2)

## 2020-04-26 LAB — URINALYSIS, ROUTINE W REFLEX MICROSCOPIC
Bilirubin Urine: NEGATIVE
Glucose, UA: NEGATIVE mg/dL
Hgb urine dipstick: NEGATIVE
Ketones, ur: NEGATIVE mg/dL
Nitrite: NEGATIVE
Protein, ur: NEGATIVE mg/dL
Specific Gravity, Urine: 1.029 (ref 1.005–1.030)
pH: 8 (ref 5.0–8.0)

## 2020-04-26 LAB — SARS CORONAVIRUS 2 BY RT PCR (HOSPITAL ORDER, PERFORMED IN ~~LOC~~ HOSPITAL LAB): SARS Coronavirus 2: NEGATIVE

## 2020-04-26 LAB — COMPREHENSIVE METABOLIC PANEL
ALT: 16 U/L (ref 0–44)
AST: 23 U/L (ref 15–41)
Albumin: 3.9 g/dL (ref 3.5–5.0)
Alkaline Phosphatase: 104 U/L (ref 38–126)
Anion gap: 11 (ref 5–15)
BUN: 29 mg/dL — ABNORMAL HIGH (ref 8–23)
CO2: 28 mmol/L (ref 22–32)
Calcium: 9.2 mg/dL (ref 8.9–10.3)
Chloride: 101 mmol/L (ref 98–111)
Creatinine, Ser: 1.35 mg/dL — ABNORMAL HIGH (ref 0.44–1.00)
GFR calc Af Amer: 41 mL/min — ABNORMAL LOW (ref >60)
GFR calc non Af Amer: 35 mL/min — ABNORMAL LOW (ref >60)
Glucose, Bld: 126 mg/dL — ABNORMAL HIGH (ref 70–99)
Potassium: 4.4 mmol/L (ref 3.5–5.1)
Sodium: 140 mmol/L (ref 135–145)
Total Bilirubin: 0.7 mg/dL (ref 0.3–1.2)
Total Protein: 7.5 g/dL (ref 6.5–8.1)

## 2020-04-26 LAB — TROPONIN I (HIGH SENSITIVITY)
Troponin I (High Sensitivity): 5 ng/L (ref <18)
Troponin I (High Sensitivity): 5 ng/L (ref <18)

## 2020-04-26 LAB — BASIC METABOLIC PANEL
Anion gap: 11 (ref 5–15)
BUN: 25 mg/dL — ABNORMAL HIGH (ref 8–23)
CO2: 24 mmol/L (ref 22–32)
Calcium: 8.4 mg/dL — ABNORMAL LOW (ref 8.9–10.3)
Chloride: 103 mmol/L (ref 98–111)
Creatinine, Ser: 1.13 mg/dL — ABNORMAL HIGH (ref 0.44–1.00)
GFR calc Af Amer: 50 mL/min — ABNORMAL LOW (ref >60)
GFR calc non Af Amer: 43 mL/min — ABNORMAL LOW (ref >60)
Glucose, Bld: 90 mg/dL (ref 70–99)
Potassium: 4.1 mmol/L (ref 3.5–5.1)
Sodium: 138 mmol/L (ref 135–145)

## 2020-04-26 LAB — LACTIC ACID, PLASMA
Lactic Acid, Venous: 1.4 mmol/L (ref 0.5–1.9)
Lactic Acid, Venous: 1.7 mmol/L (ref 0.5–1.9)

## 2020-04-26 LAB — GLUCOSE, CAPILLARY: Glucose-Capillary: 80 mg/dL (ref 70–99)

## 2020-04-26 LAB — PROTIME-INR
INR: 1.9 — ABNORMAL HIGH (ref 0.8–1.2)
INR: 2.5 — ABNORMAL HIGH (ref 0.8–1.2)
Prothrombin Time: 20.8 seconds — ABNORMAL HIGH (ref 11.4–15.2)
Prothrombin Time: 26.5 seconds — ABNORMAL HIGH (ref 11.4–15.2)

## 2020-04-26 LAB — PHOSPHORUS: Phosphorus: 3.3 mg/dL (ref 2.5–4.6)

## 2020-04-26 LAB — HEMOGLOBIN A1C
Hgb A1c MFr Bld: 5.8 % — ABNORMAL HIGH (ref 4.8–5.6)
Mean Plasma Glucose: 119.76 mg/dL

## 2020-04-26 LAB — LIPASE, BLOOD: Lipase: 30 U/L (ref 11–51)

## 2020-04-26 LAB — MAGNESIUM: Magnesium: 1.8 mg/dL (ref 1.7–2.4)

## 2020-04-26 MED ORDER — AMIODARONE HCL 100 MG PO TABS
100.0000 mg | ORAL_TABLET | Freq: Every day | ORAL | Status: DC
  Administered 2020-04-27 – 2020-05-06 (×10): 100 mg via ORAL
  Filled 2020-04-26 (×11): qty 1

## 2020-04-26 MED ORDER — DICLOFENAC SODIUM 1 % TD GEL
2.0000 g | Freq: Four times a day (QID) | TRANSDERMAL | Status: DC | PRN
  Filled 2020-04-26 (×2): qty 100

## 2020-04-26 MED ORDER — HALOPERIDOL 1 MG PO TABS
1.0000 mg | ORAL_TABLET | Freq: Four times a day (QID) | ORAL | Status: DC | PRN
  Filled 2020-04-26: qty 1

## 2020-04-26 MED ORDER — ENOXAPARIN SODIUM 100 MG/ML ~~LOC~~ SOLN
1.0000 mg/kg | Freq: Two times a day (BID) | SUBCUTANEOUS | Status: DC
  Administered 2020-04-26 – 2020-04-29 (×6): 100 mg via SUBCUTANEOUS
  Filled 2020-04-26 (×6): qty 1

## 2020-04-26 MED ORDER — AMLODIPINE BESYLATE 5 MG PO TABS
5.0000 mg | ORAL_TABLET | Freq: Every evening | ORAL | Status: DC
  Administered 2020-04-27 – 2020-05-05 (×8): 5 mg via ORAL
  Filled 2020-04-26 (×8): qty 1

## 2020-04-26 MED ORDER — SODIUM CHLORIDE (PF) 0.9 % IJ SOLN
INTRAMUSCULAR | Status: AC
  Administered 2020-04-26: 1 mL
  Filled 2020-04-26: qty 50

## 2020-04-26 MED ORDER — DONEPEZIL HCL 10 MG PO TABS
5.0000 mg | ORAL_TABLET | Freq: Every day | ORAL | Status: DC
  Administered 2020-04-26 – 2020-05-05 (×10): 5 mg via ORAL
  Filled 2020-04-26 (×10): qty 1

## 2020-04-26 MED ORDER — QUETIAPINE FUMARATE 25 MG PO TABS
50.0000 mg | ORAL_TABLET | Freq: Every day | ORAL | Status: DC
  Administered 2020-04-26 – 2020-05-05 (×10): 50 mg via ORAL
  Filled 2020-04-26 (×11): qty 2

## 2020-04-26 MED ORDER — GABAPENTIN 300 MG PO CAPS
300.0000 mg | ORAL_CAPSULE | Freq: Two times a day (BID) | ORAL | Status: DC
  Administered 2020-04-26 – 2020-05-06 (×21): 300 mg via ORAL
  Filled 2020-04-26 (×21): qty 1

## 2020-04-26 MED ORDER — HALOPERIDOL LACTATE 5 MG/ML IJ SOLN
1.0000 mg | Freq: Four times a day (QID) | INTRAMUSCULAR | Status: DC | PRN
  Administered 2020-04-26: 1 mg via INTRAMUSCULAR
  Filled 2020-04-26: qty 1

## 2020-04-26 MED ORDER — SODIUM CHLORIDE 0.9 % IV SOLN: INTRAVENOUS | Status: DC

## 2020-04-26 MED ORDER — IOHEXOL 300 MG/ML  SOLN
80.0000 mL | Freq: Once | INTRAMUSCULAR | Status: AC | PRN
  Administered 2020-04-26: 80 mL via INTRAVENOUS

## 2020-04-26 MED ORDER — ONDANSETRON HCL 4 MG PO TABS: 4.0000 mg | ORAL_TABLET | Freq: Four times a day (QID) | ORAL | Status: DC | PRN

## 2020-04-26 MED ORDER — ONDANSETRON HCL 4 MG/2ML IJ SOLN: 4.0000 mg | Freq: Four times a day (QID) | INTRAMUSCULAR | Status: DC | PRN

## 2020-04-26 MED ORDER — METOPROLOL SUCCINATE ER 25 MG PO TB24
25.0000 mg | ORAL_TABLET | Freq: Every day | ORAL | Status: DC
  Administered 2020-04-27 – 2020-05-06 (×9): 25 mg via ORAL
  Filled 2020-04-26 (×10): qty 1

## 2020-04-26 MED ORDER — ACETAMINOPHEN 325 MG PO TABS
650.0000 mg | ORAL_TABLET | Freq: Four times a day (QID) | ORAL | Status: DC | PRN
  Administered 2020-04-27 – 2020-05-05 (×19): 650 mg via ORAL
  Filled 2020-04-26 (×19): qty 2

## 2020-04-26 MED ORDER — ACETAMINOPHEN 650 MG RE SUPP: 650.0000 mg | Freq: Four times a day (QID) | RECTAL | Status: DC | PRN

## 2020-04-26 MED ORDER — VITAMIN D 25 MCG (1000 UNIT) PO TABS
2000.0000 [IU] | ORAL_TABLET | Freq: Every day | ORAL | Status: DC
  Administered 2020-04-27 – 2020-05-06 (×10): 2000 [IU] via ORAL
  Filled 2020-04-26 (×12): qty 2

## 2020-04-26 MED ORDER — PANTOPRAZOLE SODIUM 40 MG IV SOLR
40.0000 mg | INTRAVENOUS | Status: DC
  Administered 2020-04-26 – 2020-04-28 (×3): 40 mg via INTRAVENOUS
  Filled 2020-04-26 (×3): qty 40

## 2020-04-26 MED ORDER — SODIUM CHLORIDE 0.9 % IV BOLUS
500.0000 mL | Freq: Once | INTRAVENOUS | Status: AC
  Administered 2020-04-26: 500 mL via INTRAVENOUS

## 2020-04-26 MED ORDER — NITROGLYCERIN 0.4 MG SL SUBL: 0.4000 mg | SUBLINGUAL_TABLET | SUBLINGUAL | Status: DC | PRN
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
## 2020-04-26 NOTE — Progress Notes (Signed)
Same day note  Patient seen and examined at bedside.  Patient was admitted to the hospital for abdominal pain.  At the time of my evaluation, patient complains of feeling okay.  Denies overt pain nausea vomiting has not had a bowel movement or passed gas.  Physical examination reveals obese female with soft abdomen with nonspecific tenderness.  Laboratory data and imaging was reviewed  Assessment and Plan.  Small bowel ileus. CT scan of the abdomen showed small bowel dilatation without transition zone just above small bowel ileus.  Continue n.p.o. IV fluids, antiemetics ,analgesics.      CAD (coronary artery disease) On Xarelto, beta-blocker and statin as outpatient.  Since the patient is n.p.o. we will change to Lovenox.    GERD (gastroesophageal reflux disease) Continue  Protonix IV daily.    Atrial tachycardia,History of paroxysmal atrial fibrillation/flutter. CHADS-VASc Score of 9. On amiodarone, Xarelto and metoprolol at home.  Will change to Lovenox at this time.  Will resume amiodarone and p.o. metoprolol here.    Dementia due to medical condition with behavioral disturbance  Supportive care. Resume Aricept and Seroquel once tolerating p.o.    Type 2 diabetes mellitus Currently NPO.CBG monitoring every 6 hours.  Accu-Cheks.    CKD (chronic kidney disease), stage III Monitor renal function electrolytes.  Check BMP in a.m.  No Charge  Signed,  Delila Pereyra, MD [facility]

## 2020-04-26 NOTE — ED Notes (Signed)
Patient transported to CT 
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
## 2020-04-26 NOTE — ED Notes (Signed)
I have tried to call the number that is in the purple man and has tried to call the unit but neither is answering.
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
## 2020-04-26 NOTE — ED Notes (Signed)
Patient only has a turban on her head and glasses. Patients granddaughter took her clothes and purse.

## 2020-04-26 NOTE — Progress Notes (Signed)
Patient became very agitated looking for her purse and stating that this was not her room.  Staff explained that her daughter, Mickey Farber had taken it home and that 1521 was her room.  Patient became very upset, yelling at staff, wanting to go search for it, not redirectable.  Daughter, Levander Campion called.  This only appeared to agitate patient at the time.  MD notifed.  Order for prn given.  Patient now back in bed, resting quietly.  Will continue to reassess for any further needed interventions.    Virginia Rochester, RN
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
## 2020-04-27 ENCOUNTER — Inpatient Hospital Stay (HOSPITAL_COMMUNITY): Payer: Medicare Other

## 2020-04-27 DIAGNOSIS — I2510 Atherosclerotic heart disease of native coronary artery without angina pectoris: Secondary | ICD-10-CM

## 2020-04-27 DIAGNOSIS — I471 Supraventricular tachycardia: Secondary | ICD-10-CM

## 2020-04-27 DIAGNOSIS — K219 Gastro-esophageal reflux disease without esophagitis: Secondary | ICD-10-CM

## 2020-04-27 DIAGNOSIS — F0281 Dementia in other diseases classified elsewhere with behavioral disturbance: Secondary | ICD-10-CM

## 2020-04-27 LAB — CBC WITH DIFFERENTIAL/PLATELET
Abs Immature Granulocytes: 0.02 10*3/uL (ref 0.00–0.07)
Basophils Absolute: 0 10*3/uL (ref 0.0–0.1)
Basophils Relative: 1 %
Eosinophils Absolute: 0.2 10*3/uL (ref 0.0–0.5)
Eosinophils Relative: 3 %
HCT: 37.6 % (ref 36.0–46.0)
Hemoglobin: 12 g/dL (ref 12.0–15.0)
Immature Granulocytes: 0 %
Lymphocytes Relative: 29 %
Lymphs Abs: 1.7 10*3/uL (ref 0.7–4.0)
MCH: 32.9 pg (ref 26.0–34.0)
MCHC: 31.9 g/dL (ref 30.0–36.0)
MCV: 103 fL — ABNORMAL HIGH (ref 80.0–100.0)
Monocytes Absolute: 0.4 10*3/uL (ref 0.1–1.0)
Monocytes Relative: 8 %
Neutro Abs: 3.4 10*3/uL (ref 1.7–7.7)
Neutrophils Relative %: 59 %
Platelets: 213 10*3/uL (ref 150–400)
RBC: 3.65 MIL/uL — ABNORMAL LOW (ref 3.87–5.11)
RDW: 12.9 % (ref 11.5–15.5)
WBC: 5.8 10*3/uL (ref 4.0–10.5)
nRBC: 0 % (ref 0.0–0.2)

## 2020-04-27 LAB — URINE CULTURE: Culture: 30000 — AB

## 2020-04-27 LAB — COMPREHENSIVE METABOLIC PANEL
ALT: 14 U/L (ref 0–44)
AST: 22 U/L (ref 15–41)
Albumin: 3.4 g/dL — ABNORMAL LOW (ref 3.5–5.0)
Alkaline Phosphatase: 85 U/L (ref 38–126)
Anion gap: 8 (ref 5–15)
BUN: 16 mg/dL (ref 8–23)
CO2: 25 mmol/L (ref 22–32)
Calcium: 8.3 mg/dL — ABNORMAL LOW (ref 8.9–10.3)
Chloride: 108 mmol/L (ref 98–111)
Creatinine, Ser: 1.01 mg/dL — ABNORMAL HIGH (ref 0.44–1.00)
GFR calc Af Amer: 58 mL/min — ABNORMAL LOW (ref >60)
GFR calc non Af Amer: 50 mL/min — ABNORMAL LOW (ref >60)
Glucose, Bld: 77 mg/dL (ref 70–99)
Potassium: 4.1 mmol/L (ref 3.5–5.1)
Sodium: 141 mmol/L (ref 135–145)
Total Bilirubin: 0.6 mg/dL (ref 0.3–1.2)
Total Protein: 6.5 g/dL (ref 6.5–8.1)

## 2020-04-27 LAB — GLUCOSE, CAPILLARY
Glucose-Capillary: 106 mg/dL — ABNORMAL HIGH (ref 70–99)
Glucose-Capillary: 108 mg/dL — ABNORMAL HIGH (ref 70–99)
Glucose-Capillary: 141 mg/dL — ABNORMAL HIGH (ref 70–99)
Glucose-Capillary: 71 mg/dL (ref 70–99)
Glucose-Capillary: 89 mg/dL (ref 70–99)

## 2020-04-27 MED ORDER — HYDROCORTISONE (PERIANAL) 2.5 % EX CREA
1.0000 "application " | TOPICAL_CREAM | Freq: Four times a day (QID) | CUTANEOUS | Status: DC | PRN
  Filled 2020-04-27: qty 28.35

## 2020-04-27 MED ORDER — TRAMADOL HCL 50 MG PO TABS
50.0000 mg | ORAL_TABLET | Freq: Two times a day (BID) | ORAL | Status: AC | PRN
  Administered 2020-04-27: 50 mg via ORAL
  Filled 2020-04-27: qty 1

## 2020-04-27 MED ORDER — POLYVINYL ALCOHOL 1.4 % OP SOLN
1.0000 [drp] | OPHTHALMIC | Status: DC | PRN
  Filled 2020-04-27: qty 15

## 2020-04-27 MED ORDER — DICLOFENAC SODIUM 1 % EX GEL
2.0000 g | Freq: Four times a day (QID) | CUTANEOUS | Status: DC | PRN
  Administered 2020-04-27 – 2020-05-06 (×8): 2 g via TOPICAL
  Filled 2020-04-27: qty 100

## 2020-04-27 NOTE — Plan of Care (Signed)
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
## 2020-04-27 NOTE — Plan of Care (Signed)
  Problem: Coping: Goal: Level of anxiety will decrease Outcome: Progressing   Problem: Pain Managment: Goal: General experience of comfort will improve Outcome: Progressing   

## 2020-04-28 ENCOUNTER — Inpatient Hospital Stay (HOSPITAL_COMMUNITY): Payer: Medicare Other

## 2020-04-28 LAB — BASIC METABOLIC PANEL
Anion gap: 11 (ref 5–15)
BUN: 15 mg/dL (ref 8–23)
CO2: 21 mmol/L — ABNORMAL LOW (ref 22–32)
Calcium: 8 mg/dL — ABNORMAL LOW (ref 8.9–10.3)
Chloride: 108 mmol/L (ref 98–111)
Creatinine, Ser: 0.98 mg/dL (ref 0.44–1.00)
GFR calc Af Amer: 60 mL/min — ABNORMAL LOW (ref >60)
GFR calc non Af Amer: 52 mL/min — ABNORMAL LOW (ref >60)
Glucose, Bld: 99 mg/dL (ref 70–99)
Potassium: 3.9 mmol/L (ref 3.5–5.1)
Sodium: 140 mmol/L (ref 135–145)

## 2020-04-28 LAB — GLUCOSE, CAPILLARY
Glucose-Capillary: 98 mg/dL (ref 70–99)
Glucose-Capillary: 119 mg/dL — ABNORMAL HIGH (ref 70–99)
Glucose-Capillary: 110 mg/dL — ABNORMAL HIGH (ref 70–99)
Glucose-Capillary: 130 mg/dL — ABNORMAL HIGH (ref 70–99)

## 2020-04-28 LAB — CBC
HCT: 36 % (ref 36.0–46.0)
Hemoglobin: 11.7 g/dL — ABNORMAL LOW (ref 12.0–15.0)
MCH: 33.4 pg (ref 26.0–34.0)
MCHC: 32.5 g/dL (ref 30.0–36.0)
MCV: 102.9 fL — ABNORMAL HIGH (ref 80.0–100.0)
Platelets: 183 10*3/uL (ref 150–400)
RBC: 3.5 MIL/uL — ABNORMAL LOW (ref 3.87–5.11)
RDW: 13 % (ref 11.5–15.5)
WBC: 7.8 10*3/uL (ref 4.0–10.5)
nRBC: 0 % (ref 0.0–0.2)

## 2020-04-28 LAB — MAGNESIUM: Magnesium: 2 mg/dL (ref 1.7–2.4)

## 2020-04-28 MED ORDER — ACETAMINOPHEN 325 MG PO TABS
650.0000 mg | ORAL_TABLET | Freq: Once | ORAL | Status: AC
  Administered 2020-04-28: 650 mg via ORAL
  Filled 2020-04-28: qty 2

## 2020-04-28 MED ORDER — PANTOPRAZOLE SODIUM 40 MG PO TBEC
40.0000 mg | DELAYED_RELEASE_TABLET | Freq: Every day | ORAL | Status: DC
  Administered 2020-04-29 – 2020-05-06 (×8): 40 mg via ORAL
  Filled 2020-04-28 (×8): qty 1

## 2020-04-28 NOTE — Progress Notes (Signed)
Patient ID: Jill Bowman, female   DOB: 01-06-1929, 86 y.o.   MRN: [id number] I came by the patient's room per the request of her family and the primary service to assess why she is not wanting to walk or put weight on her left lower extremity.  She has a complex history in terms of trauma as a relates to both her lower extremities.  She has had bilateral knee replacements and has also a femoral nail in her left thigh.  She has hardware in her right ankle as well.  Family at the bedside said that she was able to walk to the ambulance when she was brought to the hospital with her ileus.  She has been complaining of left knee pain over the last day and is been hesitant to put any weight on her left lower extremity.  On examination of the left lower extremity, there is no evidence of infection.  The skin is soft and not red.  There is certainly chronic changes at the knee as it relates to her knee replacement and subsequent surgery from placement of a femoral nail.  On examination I am able to flex and extend her knee and move her left hip.  She is able to push down with plantar flexion of her left foot and dorsiflexion of her left foot.  She does report knee pain behind her knee.  I lifted the leg up and inspected the soft tissue.  There is no wounds.  The swelling in her knee is certainly chronic.  The knee is not significantly warm.  I did review x-rays of the left knee and see no acute findings.  The patient states that she is sore and feels like things need to settle out before she can move with her knee.  This is per her report.  However, she may be feeling the effects of her previous trauma and how the knee is certainly getting chronically more painful with time.  I certainly would not recommend any type of steroid injection in the knee since there is a total knee replacement.  It is worth trying to have therapy mobilize her and giving her pain medications as needed.  A one-time dose of a oral or IV steroid  may be helpful to alleviate acute pain.  Unfortunately, there is nothing else that I can recommend and hopefully with time she will be able to mobilize better.  I am unsure as to why she is hurting the way she hurts but, hopefully this will resolve in the short-term.  I will continue to check after her while she is in the hospital.
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
## 2020-04-28 NOTE — Progress Notes (Signed)
Pharmacy IV to PO conversion  The patient is receiving pantoprazole by the intravenous route.  Based on criteria approved by the Pharmacy and [place], the medication is being converted to the equivalent oral dose form.   No active GI bleeding or impaired absorption  Not s/p esophagectomy  Documented ability to take oral medications for > 24 hr  Plan to continue treatment for at least 1 day  If you have any questions about this conversion, please contact the Pharmacy Department (ext [phone]).  Thank you.  Reuel Boom, PharmD, BCPS [phone] 04/28/2020, 10:19 AM
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
## 2020-04-29 LAB — GLUCOSE, CAPILLARY
Glucose-Capillary: 115 mg/dL — ABNORMAL HIGH (ref 70–99)
Glucose-Capillary: 110 mg/dL — ABNORMAL HIGH (ref 70–99)
Glucose-Capillary: 236 mg/dL — ABNORMAL HIGH (ref 70–99)
Glucose-Capillary: 171 mg/dL — ABNORMAL HIGH (ref 70–99)

## 2020-04-29 MED ORDER — RIVAROXABAN 20 MG PO TABS
20.0000 mg | ORAL_TABLET | Freq: Every day | ORAL | Status: DC
  Administered 2020-04-29 – 2020-05-05 (×7): 20 mg via ORAL
  Filled 2020-04-29 (×7): qty 1

## 2020-04-29 MED ORDER — METHYLPREDNISOLONE SODIUM SUCC 40 MG IJ SOLR
40.0000 mg | Freq: Once | INTRAMUSCULAR | Status: AC
  Administered 2020-04-29: 40 mg via INTRAVENOUS
  Filled 2020-04-29: qty 1

## 2020-04-29 MED ORDER — KETOROLAC TROMETHAMINE 15 MG/ML IJ SOLN
15.0000 mg | Freq: Once | INTRAMUSCULAR | Status: AC
  Administered 2020-04-29: 15 mg via INTRAVENOUS
  Filled 2020-04-29: qty 1

## 2020-04-29 NOTE — TOC Initial Note (Addendum)
Transition of Care [facility]) - Initial/Assessment Note    Patient Details  Name: Jill Bowman MRN: [id number] Date of Birth: 03-Apr-1929  Transition of [facility]) CM/SW Contact:    Jill Mage, LCSW Phone Number: 04/29/2020, 3:48 PM  Clinical Narrative:   Patient seen in follow up to PT recommendation of SNF. Jill Bowman lives alone in [place], and has the support of children, although only one lives close by.  She has DME walker, 3 in 1 and shower chair.  Although she would prefer to return home, Jill Bowman is realistic about her situation and states she knows she is better served going for short term rehab.  Bed search initiated. TOC will continue to follow during the course of hospitalization.  Spoke with daughter who states her mother has some degree of dementia, so was happy to hear how clear she was today.  However, she clarified that the patient lives with her, but she, too, is in favor of SNF.  Clarified that her brother Jill Bowman is POA so should be contacted about choice of facility, but she is hoping for something [place] the [place] end of town to make it easier to visit.  Addendum: Signed FL2 and H and P must be sent to PASSR as she has a dementia dx.                 Expected Discharge Plan: Skilled Nursing Facility Barriers to Discharge: SNF Pending bed offer   Patient Goals and CMS Choice Patient states their goals for this hospitalization and ongoing recovery are:: "I am will to go to rehab."   Choice offered to / list presented to : Patient  Expected Discharge Plan and Services Expected Discharge Plan: [place]   Discharge Planning Services: CM Consult Post Acute Care Choice: [place] Living arrangements for the past 2 months: Single Family Home                                      Prior Living Arrangements/Services Living arrangements for the past 2 months: Single Family Home Lives with:: Self Patient language and need for  interpreter reviewed:: Yes        Need for Family Participation in Patient Care: Yes (Comment) Care giver support system in place?: Yes (comment) Current home services: DME Criminal Activity/Legal Involvement Pertinent to Current Situation/Hospitalization: No - Comment as needed  Activities of Daily Living Home Assistive Devices/Equipment: Eyeglasses, Environmental consultant (specify type), Shower chair with back ADL Screening (condition at time of admission) Patient's cognitive ability adequate to safely complete daily activities?: No Is the patient deaf or have difficulty hearing?: No Does the patient have difficulty seeing, even when wearing glasses/contacts?: Yes Does the patient have difficulty concentrating, remembering, or making decisions?: Yes Patient able to express need for assistance with ADLs?: Yes Does the patient have difficulty dressing or bathing?: Yes Independently performs ADLs?: Yes (appropriate for developmental age) Does the patient have difficulty walking or climbing stairs?: Yes Weakness of Legs: Both Weakness of Arms/Hands: None  Permission Sought/Granted Permission sought to share information with : Family Supports Permission granted to share information with : Yes, Verbal Permission Granted  Share Information with NAME: son,daughter-Dianne and Mark           Emotional Assessment Appearance:: Appears stated age Attitude/Demeanor/Rapport: Engaged Affect (typically observed): Appropriate Orientation: : Oriented to Self, Oriented to Place, Oriented to  Time, Oriented  to Situation Alcohol / Substance Use: Not Applicable Psych Involvement: No (comment)  Admission diagnosis:  Ileus ([place]) [K56.7] Generalized abdominal pain [R10.84] SBO (small bowel obstruction) ([place]) [K56.609] Patient Active Problem List   Diagnosis Date Noted  . Ileus ([place]) 04/26/2020  . Type 2 diabetes mellitus ([place]) 04/26/2020  . CKD (chronic kidney disease), stage III 04/26/2020  . SBO (small bowel  obstruction) ([place]) 04/26/2020  . Transient ischemic attack (TIA) 04/12/2020  . Progressive dementia with uncertain etiology ([place]) 04/12/2020  . Dementia due to medical condition with behavioral disturbance ([place]) 04/12/2020  . Paranoid reaction ([place]) 04/12/2020  . Encounter for care of pacemaker 09/12/2019  . Stented coronary artery 11/07/2018  . Pseudoseizure   . Late onset Alzheimer's disease without behavioral disturbance ([place])   . GERD (gastroesophageal reflux disease) 01/17/2018  . Depression 01/17/2018  . Atrial tachycardia ([place]) 01/17/2018  . HLD (hyperlipidemia) 01/17/2018  . Breast cancer of lower-outer quadrant of right female breast ([place]) 01/03/2016  . Open wound of abdominal wall without complication [id number]  . Cellulitis of abdominal wall 03/24/2015  . Elevated alkaline phosphatase level 12/28/2014  . Abdominal wall abscess 10/19/2014  . Bradycardia 10/19/2014  . Physical deconditioning 09/30/2012  . Soft tissue infection, L leg 09/11/2012  . CAD (coronary artery disease) 07/16/2012  . Pacemaker  Dual chamber Medtronic Adapta L ADDRL1  07/16/2012  . Morbid obesity ([place]) 07/16/2012  . Degloving injury of lower leg, Left 07/04/2012  . Tachycardia-bradycardia syndrome [facility])    PCP:  Shelby Mattocks, PA-C Pharmacy:   [place], [place] [address] Phone: [phone] Fax: [phone]     Social Determinants of Health (SDOH) Interventions    Readmission Risk Interventions No flowsheet data found.

## 2020-04-29 NOTE — NC FL2 (Deleted)
[place] LEVEL OF CARE SCREENING TOOL     IDENTIFICATION  Patient Name: Jill Bowman Birthdate: [birth year] Sex: female Admission Date (Current Location): 04/25/2020  [facility] and [place] Number:      Facility and Address:         Provider Number:    Attending Physician Name and Address:  Flora Lipps, MD  Relative Name and Phone Number:       Current Level of Care:   Recommended Level of Care:   Prior Approval Number:    Date Approved/Denied:   PASRR Number:    Discharge Plan:      Current Diagnoses: Patient Active Problem List   Diagnosis Date Noted  . Ileus ([place]) 04/26/2020  . Type 2 diabetes mellitus ([place]) 04/26/2020  . CKD (chronic kidney disease), stage III 04/26/2020  . SBO (small bowel obstruction) ([place]) 04/26/2020  . Transient ischemic attack (TIA) 04/12/2020  . Progressive dementia with uncertain etiology ([place]) 04/12/2020  . Dementia due to medical condition with behavioral disturbance ([place]) 04/12/2020  . Paranoid reaction ([place]) 04/12/2020  . Encounter for care of pacemaker 09/12/2019  . Stented coronary artery 11/07/2018  . Pseudoseizure   . Late onset Alzheimer's disease without behavioral disturbance ([place])   . GERD (gastroesophageal reflux disease) 01/17/2018  . Depression 01/17/2018  . Atrial tachycardia ([place]) 01/17/2018  . HLD (hyperlipidemia) 01/17/2018  . Breast cancer of lower-outer quadrant of right female breast ([place]) 01/03/2016  . Open wound of abdominal wall without complication [id number]  . Cellulitis of abdominal wall 03/24/2015  . Elevated alkaline phosphatase level 12/28/2014  . Abdominal wall abscess 10/19/2014  . Bradycardia 10/19/2014  . Physical deconditioning 09/30/2012  . Soft tissue infection, L leg 09/11/2012  . CAD (coronary artery disease) 07/16/2012  . Pacemaker  Dual chamber Medtronic Adapta L ADDRL1  07/16/2012  . Morbid obesity ([place]) 07/16/2012  . Degloving injury of lower leg, Left  07/04/2012  . Tachycardia-bradycardia syndrome ([place])     Orientation RESPIRATION BLADDER Height & Weight            Weight: 101.2 kg Height:  5\' 4"  (162.6 cm)  BEHAVIORAL SYMPTOMS/MOOD NEUROLOGICAL BOWEL NUTRITION STATUS           AMBULATORY STATUS COMMUNICATION OF NEEDS Skin                               Personal Care Assistance Level of Assistance              Functional Limitations Info             SPECIAL CARE FACTORS FREQUENCY                       Contractures      Additional Factors Info                  Current Medications (04/29/2020):  This is the current hospital active medication list Current Facility-Administered Medications  Medication Dose Route Frequency Provider Last Rate Last Admin  . acetaminophen (TYLENOL) tablet 650 mg  650 mg Oral Q6H PRN Reubin Milan, MD   650 mg at 04/29/20 1145   Or  . acetaminophen (TYLENOL) suppository 650 mg  650 mg Rectal Q6H PRN Reubin Milan, MD      . amiodarone (PACERONE) tablet 100 mg  100 mg Oral Daily Pokhrel, Laxman, MD   100 mg at 04/29/20 0840  .  amLODipine (NORVASC) tablet 5 mg  5 mg Oral QPM Pokhrel, Laxman, MD   5 mg at 04/28/20 1744  . cholecalciferol (VITAMIN D) tablet 2,000 Units  2,000 Units Oral Daily Pokhrel, Laxman, MD   2,000 Units at 04/29/20 0841  . diclofenac Sodium (VOLTAREN) 1 % topical gel 2 g  2 g Topical QID PRN Pokhrel, Laxman, MD   2 g at 04/29/20 0624  . donepezil (ARICEPT) tablet 5 mg  5 mg Oral QHS Pokhrel, Laxman, MD   5 mg at 04/28/20 2235  . gabapentin (NEURONTIN) capsule 300 mg  300 mg Oral BID Pokhrel, Laxman, MD   300 mg at 04/29/20 1145  . haloperidol (HALDOL) tablet 1 mg  1 mg Oral Q6H PRN Pokhrel, Laxman, MD       Or  . haloperidol lactate (HALDOL) injection 1 mg  1 mg Intramuscular Q6H PRN Pokhrel, Laxman, MD   1 mg at 04/26/20 1744  . hydrocortisone (ANUSOL-HC) 2.5 % rectal cream 1 application  1 application Topical QID PRN Pokhrel, Laxman, MD       . metoprolol succinate (TOPROL-XL) 24 hr tablet 25 mg  25 mg Oral Daily Pokhrel, Laxman, MD   25 mg at 04/29/20 0841  . nitroGLYCERIN (NITROSTAT) SL tablet 0.4 mg  0.4 mg Sublingual Q5 min PRN Pokhrel, Laxman, MD      . ondansetron (ZOFRAN) tablet 4 mg  4 mg Oral Q6H PRN Reubin Milan, MD       Or  . ondansetron [facility]) injection 4 mg  4 mg Intravenous Q6H PRN Reubin Milan, MD      . pantoprazole (PROTONIX) EC tablet 40 mg  40 mg Oral Daily Polly Cobia, RPH   40 mg at 04/29/20 0841  . polyvinyl alcohol (LIQUIFILM TEARS) 1.4 % ophthalmic solution 1 drop  1 drop Both Eyes PRN Pokhrel, Laxman, MD      . QUEtiapine (SEROQUEL) tablet 50 mg  50 mg Oral QHS Pokhrel, Laxman, MD   50 mg at 04/28/20 2235  . rivaroxaban (XARELTO) tablet 20 mg  20 mg Oral Q supper Pokhrel, Laxman, MD         Discharge Medications: Please see discharge summary for a list of discharge medications.  Relevant Imaging Results:  Relevant Lab Results:   Additional [place], LCSW

## 2020-04-29 NOTE — Care Management Important Message (Signed)
Important Message  Patient Details  Name: Jill Bowman MRN: [id number] Date of Birth: 03/01/1929   Medicare Important Message Given:     Document given to [facility]  to give to the patient.   Crista Luria 04/29/2020, 10:41 AM

## 2020-04-29 NOTE — NC FL2 (Signed)
[place] MEDICAID FL2 LEVEL OF CARE SCREENING TOOL     IDENTIFICATION  Patient Name: Jill Bowman Birthdate: 1928/12/07 Sex: female Admission Date (Current Location): 04/25/2020  [facility] and [place] Number:  Herbalist and Address:  [facility],  [place] [address], [place]      Provider Number: [id number]  Attending Physician Name and Address:  Flora Lipps, MD  Relative Name and Phone Number:  Carlton Adam, daughter, 44 [phone]    Current Level of Care: Hospital Recommended Level of Care: [place] Prior Approval Number:    Date Approved/Denied:   PASRR Number: pending  Discharge Plan: SNF    Current Diagnoses: Patient Active Problem List   Diagnosis Date Noted  . Ileus ([place]) 04/26/2020  . Type 2 diabetes mellitus ([place]) 04/26/2020  . CKD (chronic kidney disease), stage III 04/26/2020  . SBO (small bowel obstruction) ([place]) 04/26/2020  . Transient ischemic attack (TIA) 04/12/2020  . Progressive dementia with uncertain etiology ([place]) 04/12/2020  . Dementia due to medical condition with behavioral disturbance ([place]) 04/12/2020  . Paranoid reaction ([place]) 04/12/2020  . Encounter for care of pacemaker 09/12/2019  . Stented coronary artery 11/07/2018  . Pseudoseizure   . Late onset Alzheimer's disease without behavioral disturbance ([place])   . GERD (gastroesophageal reflux disease) 01/17/2018  . Depression 01/17/2018  . Atrial tachycardia ([place]) 01/17/2018  . HLD (hyperlipidemia) 01/17/2018  . Breast cancer of lower-outer quadrant of right female breast ([place]) 01/03/2016  . Open wound of abdominal wall without complication [id number]  . Cellulitis of abdominal wall 03/24/2015  . Elevated alkaline phosphatase level 12/28/2014  . Abdominal wall abscess 10/19/2014  . Bradycardia 10/19/2014  . Physical deconditioning 09/30/2012  . Soft tissue infection, L leg 09/11/2012  . CAD (coronary artery disease) 07/16/2012  .  Pacemaker  Dual chamber Medtronic Adapta L ADDRL1  07/16/2012  . Morbid obesity ([place]) 07/16/2012  . Degloving injury of lower leg, Left 07/04/2012  . Tachycardia-bradycardia syndrome ([place])     Orientation RESPIRATION BLADDER Height & Weight     Self, Place  Normal External catheter Weight: 101.2 kg Height:  5\' 4"  (162.6 cm)  BEHAVIORAL SYMPTOMS/MOOD NEUROLOGICAL BOWEL NUTRITION STATUS  (none) (none) Incontinent Diet(see d/c summary)  AMBULATORY STATUS COMMUNICATION OF NEEDS Skin   Extensive Assist Verbally Normal                       Personal Care Assistance Level of Assistance  Bathing, Feeding, Dressing Bathing Assistance: Maximum assistance Feeding assistance: Independent Dressing Assistance: Limited assistance     Functional Limitations Info  Sight, Hearing, Speech Sight Info: Adequate Hearing Info: Adequate Speech Info: Adequate    SPECIAL CARE FACTORS FREQUENCY  PT (By licensed PT), OT (By licensed OT)     PT Frequency: 5X/W OT Frequency: 5X/W            Contractures Contractures Info: Not present    Additional Factors Info  Code Status, Allergies Code Status Info: Full Allergies Info: Codeine, Hydroxyzine, Lorazapam, Penicillins, Sulfa Antibiotics, Zinc, Ciproflaxacin, Latex           Current Medications (04/29/2020):  This is the current hospital active medication list Current Facility-Administered Medications  Medication Dose Route Frequency Provider Last Rate Last Admin  . acetaminophen (TYLENOL) tablet 650 mg  650 mg Oral Q6H PRN Reubin Milan, MD   650 mg at 04/29/20 1145   Or  . acetaminophen (TYLENOL) suppository 650 mg  650  mg Rectal Q6H PRN Reubin Milan, MD      . amiodarone (PACERONE) tablet 100 mg  100 mg Oral Daily Pokhrel, Laxman, MD   100 mg at 04/29/20 0840  . amLODipine (NORVASC) tablet 5 mg  5 mg Oral QPM Pokhrel, Laxman, MD   5 mg at 04/28/20 1744  . cholecalciferol (VITAMIN D) tablet 2,000 Units  2,000 Units Oral  Daily Pokhrel, Laxman, MD   2,000 Units at 04/29/20 0841  . diclofenac Sodium (VOLTAREN) 1 % topical gel 2 g  2 g Topical QID PRN Pokhrel, Laxman, MD   2 g at 04/29/20 0624  . donepezil (ARICEPT) tablet 5 mg  5 mg Oral QHS Pokhrel, Laxman, MD   5 mg at 04/28/20 2235  . gabapentin (NEURONTIN) capsule 300 mg  300 mg Oral BID Pokhrel, Laxman, MD   300 mg at 04/29/20 1145  . haloperidol (HALDOL) tablet 1 mg  1 mg Oral Q6H PRN Pokhrel, Laxman, MD       Or  . haloperidol lactate (HALDOL) injection 1 mg  1 mg Intramuscular Q6H PRN Pokhrel, Laxman, MD   1 mg at 04/26/20 1744  . hydrocortisone (ANUSOL-HC) 2.5 % rectal cream 1 application  1 application Topical QID PRN Pokhrel, Laxman, MD      . metoprolol succinate (TOPROL-XL) 24 hr tablet 25 mg  25 mg Oral Daily Pokhrel, Laxman, MD   25 mg at 04/29/20 0841  . nitroGLYCERIN (NITROSTAT) SL tablet 0.4 mg  0.4 mg Sublingual Q5 min PRN Pokhrel, Laxman, MD      . ondansetron (ZOFRAN) tablet 4 mg  4 mg Oral Q6H PRN Reubin Milan, MD       Or  . ondansetron [facility]) injection 4 mg  4 mg Intravenous Q6H PRN Reubin Milan, MD      . pantoprazole (PROTONIX) EC tablet 40 mg  40 mg Oral Daily Polly Cobia, RPH   40 mg at 04/29/20 0841  . polyvinyl alcohol (LIQUIFILM TEARS) 1.4 % ophthalmic solution 1 drop  1 drop Both Eyes PRN Pokhrel, Laxman, MD      . QUEtiapine (SEROQUEL) tablet 50 mg  50 mg Oral QHS Pokhrel, Laxman, MD   50 mg at 04/28/20 2235  . rivaroxaban (XARELTO) tablet 20 mg  20 mg Oral Q supper Pokhrel, Laxman, MD         Discharge Medications: Please see discharge summary for a list of discharge medications.  Relevant Imaging Results:  Relevant Lab Results:   Additional Information [place], [place]

## 2020-04-29 NOTE — Progress Notes (Signed)
°   04/29/20 0518  Assess: MEWS Score  Temp (!) 101.7 F (38.7 C)  BP (!) 153/56  Pulse Rate 74  Resp 18  SpO2 95 %  O2 Device Room Air  Assess: MEWS Score  MEWS Temp 2  MEWS Systolic 0  MEWS Pulse 0  MEWS RR 0  MEWS LOC 0  MEWS Score 2  MEWS Score Color Yellow  Assess: if the MEWS score is Yellow or Red  Were vital signs taken at a resting state? Yes  Focused Assessment Documented focused assessment  Early Detection of Sepsis Score *See Row Information* Low  MEWS guidelines implemented *See Row Information* Yes  Treat  MEWS Interventions Administered scheduled meds/treatments  Take Vital Signs  Increase Vital Sign Frequency  Yellow: Q 2hr X 2 then Q 4hr X 2, if remains yellow, continue Q 4hrs  Escalate  MEWS: Escalate Yellow: discuss with charge nurse/RN and consider discussing with provider and RRT  Notify: Charge Nurse/RN  Name of Charge Nurse/RN Notified [place]  Date Charge Nurse/RN Notified 04/29/20  Time Charge Nurse/RN Notified 0549  Notify: Provider  Provider Name/Title Dr Raliegh Ip  Date Provider Notified 04/29/20  Time Provider Notified [phone]  Notification Type Page  Notification Reason Change in status  Response No new orders;Other (Comment) (tylenol given, ice to wm,tender knee)  Date of Provider Response 04/29/20  Time of Provider Response 0540  Document  Patient Outcome Other (Comment) (pending)
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
## 2020-04-29 NOTE — Progress Notes (Signed)
Temp 101.7 at 0530 left knee tender wm to touch.  Tylenol 650mg  adm, ice applied to knee On Call notified

## 2020-04-30 LAB — BASIC METABOLIC PANEL
Anion gap: 8 (ref 5–15)
BUN: 19 mg/dL (ref 8–23)
CO2: 23 mmol/L (ref 22–32)
Calcium: 8.3 mg/dL — ABNORMAL LOW (ref 8.9–10.3)
Chloride: 110 mmol/L (ref 98–111)
Creatinine, Ser: 0.93 mg/dL (ref 0.44–1.00)
GFR calc Af Amer: >60 mL/min (ref >60)
GFR calc non Af Amer: 55 mL/min — ABNORMAL LOW (ref >60)
Glucose, Bld: 120 mg/dL — ABNORMAL HIGH (ref 70–99)
Potassium: 3.8 mmol/L (ref 3.5–5.1)
Sodium: 141 mmol/L (ref 135–145)

## 2020-04-30 LAB — GLUCOSE, CAPILLARY
Glucose-Capillary: 106 mg/dL — ABNORMAL HIGH (ref 70–99)
Glucose-Capillary: 132 mg/dL — ABNORMAL HIGH (ref 70–99)
Glucose-Capillary: 95 mg/dL (ref 70–99)
Glucose-Capillary: 144 mg/dL — ABNORMAL HIGH (ref 70–99)

## 2020-04-30 LAB — CBC
HCT: 33.2 % — ABNORMAL LOW (ref 36.0–46.0)
Hemoglobin: 10.9 g/dL — ABNORMAL LOW (ref 12.0–15.0)
MCH: 33.2 pg (ref 26.0–34.0)
MCHC: 32.8 g/dL (ref 30.0–36.0)
MCV: 101.2 fL — ABNORMAL HIGH (ref 80.0–100.0)
Platelets: 166 10*3/uL (ref 150–400)
RBC: 3.28 MIL/uL — ABNORMAL LOW (ref 3.87–5.11)
RDW: 13 % (ref 11.5–15.5)
WBC: 9.2 10*3/uL (ref 4.0–10.5)
nRBC: 0 % (ref 0.0–0.2)

## 2020-04-30 MED ORDER — METHYLPREDNISOLONE SODIUM SUCC 40 MG IJ SOLR
40.0000 mg | Freq: Once | INTRAMUSCULAR | Status: AC
  Administered 2020-04-30: 40 mg via INTRAVENOUS
  Filled 2020-04-30: qty 1

## 2020-04-30 MED ORDER — KETOROLAC TROMETHAMINE 15 MG/ML IJ SOLN
15.0000 mg | Freq: Three times a day (TID) | INTRAMUSCULAR | Status: AC | PRN
  Administered 2020-04-30 – 2020-05-01 (×3): 15 mg via INTRAVENOUS
  Filled 2020-04-30 (×3): qty 1

## 2020-04-30 NOTE — Care Management Important Message (Signed)
Important Message  Patient Details  Name: Jill Bowman MRN: [id number] Date of Birth: [birth year]   Medicare Important Message Given:     Document has been given to [place], NCM  to give to the patient.   Crista Luria 04/30/2020, 11:20 AM

## 2020-04-30 NOTE — Consult Note (Signed)
   [facility] CM Inpatient Consult   04/30/2020  Lillyonna Armstead 1928/12/20 [id number]   Patient screened for potential [facility] Care Management services due to unplanned readmission risk score of 24%, high. Per chart review, current disposition plan is for SNF. No [facility] CM identifiable needs at this time.  Netta Cedars, MSN, [facility] Liaison Nurse Mobile Phone [phone]  Toll free office [phone]
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
## 2020-04-30 NOTE — TOC Progression Note (Signed)
Transition of Care [facility]) - Progression Note    Patient Details  Name: Tenika Keeran MRN: [id number] Date of Birth: 1929-10-10  Transition of Care [facility]) CM/SW Contact  Shade Flood, LCSW Phone Number: 04/30/2020, 1:35 PM  Clinical Narrative:     TOC following. Requested clinical sent to [facility] for pt's PASRR evaluation. Awaiting PASRR assignment. RNCM to follow up with pt/family on SNF bed offers.  Expected Discharge Plan: Skilled Nursing Facility Barriers to Discharge: SNF Pending bed offer  Expected Discharge Plan and Services Expected Discharge Plan: [place]   Discharge Planning Services: CM Consult Post Acute Care Choice: [place] Living arrangements for the past 2 months: Single Family Home                                       Social Determinants of Health (SDOH) Interventions    Readmission Risk Interventions No flowsheet data found.

## 2020-04-30 NOTE — TOC Progression Note (Signed)
Transition of Care [facility]) - Progression Note    Patient Details  Name: Jill Bowman MRN: [id number] Date of Birth: [birth year]  Transition [facility]) CM/SW Contact  Purcell Mouton, RN Phone Number: 04/30/2020, 2:02 PM  Clinical Narrative:    Spoke with pt's brother Jill Bowman [phone] who is HCPOA who asked if his sister Jill Bowman who is also HCPOA could join this call. Gave both SNF bed offers. Jill Bowman asked for a SNF in [place] [facility] because she lives in [place]. [facility]    Expected Discharge Plan: Skilled Nursing Facility Barriers to Discharge: SNF Pending bed offer  Expected Discharge Plan and Services Expected Discharge Plan: [place]   Discharge Planning Services: CM Consult Post Acute Care Choice: [place] Living arrangements for the past 2 months: Single Family Home                                       Social Determinants of Health (SDOH) Interventions    Readmission Risk Interventions No flowsheet data found.
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
## 2020-04-30 NOTE — Progress Notes (Signed)
Transition of Care (TOC) -30 day Note       Patient Details  Name: Jill Bowman MRN: [id number] Date of Birth: 09/22/1929   Transition of Care [facility]) CM/SW Contact  Name: Shade Flood  Phone Number: [phone] Date: 04/30/2020 Time: 11:50   MUST ID: [id number]   To Whom it May Concern:   Please be advised that the above patient will require a short-term nursing home stay, anticipated 30 days or less rehabilitation and strengthening. The plan is for return home.

## 2020-05-01 LAB — GLUCOSE, CAPILLARY
Glucose-Capillary: 126 mg/dL — ABNORMAL HIGH (ref 70–99)
Glucose-Capillary: 202 mg/dL — ABNORMAL HIGH (ref 70–99)
Glucose-Capillary: 106 mg/dL — ABNORMAL HIGH (ref 70–99)
Glucose-Capillary: 120 mg/dL — ABNORMAL HIGH (ref 70–99)
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
## 2020-05-02 LAB — CBC
HCT: 31.2 % — ABNORMAL LOW (ref 36.0–46.0)
Hemoglobin: 10.4 g/dL — ABNORMAL LOW (ref 12.0–15.0)
MCH: 33.9 pg (ref 26.0–34.0)
MCHC: 33.3 g/dL (ref 30.0–36.0)
MCV: 101.6 fL — ABNORMAL HIGH (ref 80.0–100.0)
Platelets: 208 10*3/uL (ref 150–400)
RBC: 3.07 MIL/uL — ABNORMAL LOW (ref 3.87–5.11)
RDW: 13.2 % (ref 11.5–15.5)
WBC: 7.6 10*3/uL (ref 4.0–10.5)
nRBC: 0 % (ref 0.0–0.2)

## 2020-05-02 LAB — BASIC METABOLIC PANEL
Anion gap: 6 (ref 5–15)
BUN: 41 mg/dL — ABNORMAL HIGH (ref 8–23)
CO2: 24 mmol/L (ref 22–32)
Calcium: 8.3 mg/dL — ABNORMAL LOW (ref 8.9–10.3)
Chloride: 108 mmol/L (ref 98–111)
Creatinine, Ser: 1.19 mg/dL — ABNORMAL HIGH (ref 0.44–1.00)
GFR calc Af Amer: 47 mL/min — ABNORMAL LOW (ref >60)
GFR calc non Af Amer: 41 mL/min — ABNORMAL LOW (ref >60)
Glucose, Bld: 90 mg/dL (ref 70–99)
Potassium: 4.3 mmol/L (ref 3.5–5.1)
Sodium: 138 mmol/L (ref 135–145)

## 2020-05-02 LAB — MAGNESIUM: Magnesium: 2 mg/dL (ref 1.7–2.4)

## 2020-05-02 LAB — GLUCOSE, CAPILLARY
Glucose-Capillary: 82 mg/dL (ref 70–99)
Glucose-Capillary: 72 mg/dL (ref 70–99)
Glucose-Capillary: 117 mg/dL — ABNORMAL HIGH (ref 70–99)
Glucose-Capillary: 116 mg/dL — ABNORMAL HIGH (ref 70–99)

## 2020-05-02 LAB — PHOSPHORUS: Phosphorus: 3.5 mg/dL (ref 2.5–4.6)

## 2020-05-02 MED ORDER — IBUPROFEN 200 MG PO TABS
400.0000 mg | ORAL_TABLET | Freq: Three times a day (TID) | ORAL | Status: DC | PRN
  Administered 2020-05-02 – 2020-05-03 (×2): 400 mg via ORAL
  Filled 2020-05-02 (×2): qty 2
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
## 2020-05-03 LAB — BASIC METABOLIC PANEL
Anion gap: 8 (ref 5–15)
BUN: 39 mg/dL — ABNORMAL HIGH (ref 8–23)
CO2: 24 mmol/L (ref 22–32)
Calcium: 8.4 mg/dL — ABNORMAL LOW (ref 8.9–10.3)
Chloride: 109 mmol/L (ref 98–111)
Creatinine, Ser: 1.06 mg/dL — ABNORMAL HIGH (ref 0.44–1.00)
GFR calc Af Amer: 54 mL/min — ABNORMAL LOW (ref >60)
GFR calc non Af Amer: 47 mL/min — ABNORMAL LOW (ref >60)
Glucose, Bld: 84 mg/dL (ref 70–99)
Potassium: 4.6 mmol/L (ref 3.5–5.1)
Sodium: 141 mmol/L (ref 135–145)

## 2020-05-03 LAB — GLUCOSE, CAPILLARY
Glucose-Capillary: 77 mg/dL (ref 70–99)
Glucose-Capillary: 136 mg/dL — ABNORMAL HIGH (ref 70–99)
Glucose-Capillary: 127 mg/dL — ABNORMAL HIGH (ref 70–99)
Glucose-Capillary: 88 mg/dL (ref 70–99)

## 2020-05-03 MED ORDER — TRAMADOL HCL 50 MG PO TABS: 50.0000 mg | ORAL_TABLET | Freq: Four times a day (QID) | ORAL | Status: DC | PRN

## 2020-05-03 MED ORDER — TRAMADOL HCL 50 MG PO TABS
50.0000 mg | ORAL_TABLET | Freq: Three times a day (TID) | ORAL | Status: DC | PRN
  Administered 2020-05-03 – 2020-05-05 (×5): 50 mg via ORAL
  Filled 2020-05-03 (×5): qty 1

## 2020-05-03 NOTE — TOC Progression Note (Signed)
Transition of Care [facility]) - Progression Note    Patient Details  Name: Jill Bowman MRN: [id number] Date of Birth: Jul 24, 1929  Transition of Care [facility]) CM/SW [place], [place] Phone Number: 05/03/2020, 10:13 AM  Clinical Narrative:   Spoke to daughter Levander Campion to confirm choice fo facility.  She requested that I send her the list of facilities so she could look them over and compare them.  Awaiting PASSR level II review.  TOC will continue to follow during the course of hospitalization.      Expected Discharge Plan: Skilled Nursing Facility Barriers to Discharge: Other (comment)(Waiting for PASSR level II review)  Expected Discharge Plan and Services Expected Discharge Plan: [place]   Discharge Planning Services: CM Consult Post Acute Care Choice: [place] Living arrangements for the past 2 months: Single Family Home                                       Social Determinants of Health (SDOH) Interventions    Readmission Risk Interventions No flowsheet data found.
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
## 2020-05-03 NOTE — Care Management Important Message (Signed)
Important Message  Patient Details  Name: Jill Bowman MRN: [id number] Date of Birth: Dec 03, 1928   Medicare Important Message Given:     Document was given to [facility]  to give to the patient.   Crista Luria 05/03/2020, 10:59 AM

## 2020-05-04 LAB — GLUCOSE, CAPILLARY
Glucose-Capillary: 85 mg/dL (ref 70–99)
Glucose-Capillary: 77 mg/dL (ref 70–99)
Glucose-Capillary: 117 mg/dL — ABNORMAL HIGH (ref 70–99)
Glucose-Capillary: 119 mg/dL — ABNORMAL HIGH (ref 70–99)

## 2020-05-04 LAB — SARS CORONAVIRUS 2 BY RT PCR (HOSPITAL ORDER, PERFORMED IN ~~LOC~~ HOSPITAL LAB): SARS Coronavirus 2: NEGATIVE

## 2020-05-04 NOTE — Care Management Important Message (Signed)
Important Message  Patient Details IM Letter given to [name] SW Case Manager to present to the Patient Name: Jill Bowman MRN: [id number] Date of Birth: June 14, 1929   Medicare Important Message Given:  Yes     Kerin Salen 05/04/2020, 10:49 AM

## 2020-05-04 NOTE — Clinical Social Work Note (Signed)
    RE: Jill Bowman Date of Birth: 05/19/1928    To Whom It May Concern:  Please be advised that the above-named patient has a primary diagnosis of dementia which supersedes any psychiatric diagnosis.   Flora Lipps MD signature  05/04/2020 Date
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
## 2020-05-05 LAB — CBC
HCT: 33.3 % — ABNORMAL LOW (ref 36.0–46.0)
Hemoglobin: 10.7 g/dL — ABNORMAL LOW (ref 12.0–15.0)
MCH: 32.6 pg (ref 26.0–34.0)
MCHC: 32.1 g/dL (ref 30.0–36.0)
MCV: 101.5 fL — ABNORMAL HIGH (ref 80.0–100.0)
Platelets: 226 10*3/uL (ref 150–400)
RBC: 3.28 MIL/uL — ABNORMAL LOW (ref 3.87–5.11)
RDW: 13 % (ref 11.5–15.5)
WBC: 8.8 10*3/uL (ref 4.0–10.5)
nRBC: 0 % (ref 0.0–0.2)

## 2020-05-05 LAB — BASIC METABOLIC PANEL
Anion gap: 10 (ref 5–15)
BUN: 20 mg/dL (ref 8–23)
CO2: 25 mmol/L (ref 22–32)
Calcium: 8.3 mg/dL — ABNORMAL LOW (ref 8.9–10.3)
Chloride: 102 mmol/L (ref 98–111)
Creatinine, Ser: 0.99 mg/dL (ref 0.44–1.00)
GFR calc Af Amer: 59 mL/min — ABNORMAL LOW (ref >60)
GFR calc non Af Amer: 51 mL/min — ABNORMAL LOW (ref >60)
Glucose, Bld: 97 mg/dL (ref 70–99)
Potassium: 4.1 mmol/L (ref 3.5–5.1)
Sodium: 137 mmol/L (ref 135–145)

## 2020-05-05 LAB — GLUCOSE, CAPILLARY
Glucose-Capillary: 88 mg/dL (ref 70–99)
Glucose-Capillary: 165 mg/dL — ABNORMAL HIGH (ref 70–99)
Glucose-Capillary: 142 mg/dL — ABNORMAL HIGH (ref 70–99)
Glucose-Capillary: 99 mg/dL (ref 70–99)

## 2020-05-05 MED ORDER — SODIUM CHLORIDE 0.9 % IV BOLUS
250.0000 mL | Freq: Once | INTRAVENOUS | Status: AC
  Administered 2020-05-05: 250 mL via INTRAVENOUS

## 2020-05-05 MED ORDER — HYDRALAZINE HCL 20 MG/ML IJ SOLN: 10.0000 mg | INTRAMUSCULAR | Status: DC | PRN
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
## 2020-05-05 NOTE — Plan of Care (Signed)

## 2020-05-06 DIAGNOSIS — M25562 Pain in left knee: Secondary | ICD-10-CM

## 2020-05-06 DIAGNOSIS — G8929 Other chronic pain: Secondary | ICD-10-CM

## 2020-05-06 DIAGNOSIS — N1831 Chronic kidney disease, stage 3a: Secondary | ICD-10-CM

## 2020-05-06 LAB — GLUCOSE, CAPILLARY
Glucose-Capillary: 83 mg/dL (ref 70–99)
Glucose-Capillary: 155 mg/dL — ABNORMAL HIGH (ref 70–99)

## 2020-05-06 MED ORDER — ACETAMINOPHEN 325 MG PO TABS: 650.0000 mg | ORAL_TABLET | Freq: Four times a day (QID) | ORAL | Status: DC | PRN

## 2020-05-06 MED ORDER — TRAMADOL HCL 50 MG PO TABS: 50.0000 mg | ORAL_TABLET | Freq: Three times a day (TID) | ORAL | 0 refills | Status: DC | PRN

## 2020-05-06 MED ORDER — HYDROCORTISONE (PERIANAL) 2.5 % EX CREA: 1.0000 "application " | TOPICAL_CREAM | Freq: Four times a day (QID) | CUTANEOUS | 0 refills | Status: DC | PRN

## 2020-05-06 NOTE — NC FL2 (Signed)
[place] MEDICAID FL2 LEVEL OF CARE SCREENING TOOL     IDENTIFICATION  Patient Name: Jill Bowman Birthdate: 1929-02-19 Sex: female Admission Date (Current Location): 04/25/2020  [facility] and [place] Number:  Herbalist and Address:  [facility],  [place] [address] [place]      Provider Number: [id number]  Attending Physician Name and Address:  Flora Lipps, MD  Relative Name and Phone Number:  Carlton Adam, daughter, [place]    Current Level of Care: Hospital Recommended Level of Care: [place] Prior Approval Number:    Date Approved/Denied:   PASRR Number: [id number]  Discharge Plan: SNF    Current Diagnoses: Patient Active Problem List   Diagnosis Date Noted  . Ileus ([place]) 04/26/2020  . Type 2 diabetes mellitus ([place]) 04/26/2020  . CKD (chronic kidney disease), stage III 04/26/2020  . SBO (small bowel obstruction) ([place]) 04/26/2020  . Transient ischemic attack (TIA) 04/12/2020  . Progressive dementia with uncertain etiology ([place]) 04/12/2020  . Dementia due to medical condition with behavioral disturbance ([place]) 04/12/2020  . Paranoid reaction ([place]) 04/12/2020  . Encounter for care of pacemaker 09/12/2019  . Stented coronary artery 11/07/2018  . Pseudoseizure   . Late onset Alzheimer's disease without behavioral disturbance ([place])   . GERD (gastroesophageal reflux disease) 01/17/2018  . Depression 01/17/2018  . Atrial tachycardia ([place]) 01/17/2018  . HLD (hyperlipidemia) 01/17/2018  . Breast cancer of lower-outer quadrant of right female breast ([place]) 01/03/2016  . Open wound of abdominal wall without complication 01/75/1025  . Cellulitis of abdominal wall 03/24/2015  . Elevated alkaline phosphatase level 12/28/2014  . Abdominal wall abscess 10/19/2014  . Bradycardia 10/19/2014  . Physical deconditioning 09/30/2012  . Soft tissue infection, L leg 09/11/2012  . CAD (coronary artery disease) 07/16/2012   . Pacemaker  Dual chamber Medtronic Adapta L ADDRL1  07/16/2012  . Morbid obesity ([place]) 07/16/2012  . Degloving injury of lower leg, Left 07/04/2012  . Tachycardia-bradycardia syndrome ([place])     Orientation RESPIRATION BLADDER Height & Weight     Self, Place  Normal External catheter Weight: 101.2 kg Height:  5\' 4"  (162.6 cm)  BEHAVIORAL SYMPTOMS/MOOD NEUROLOGICAL BOWEL NUTRITION STATUS   (none)  (none) Incontinent Diet (see d/c summary)  AMBULATORY STATUS COMMUNICATION OF NEEDS Skin   Extensive Assist Verbally Normal                       Personal Care Assistance Level of Assistance  Bathing, Feeding, Dressing Bathing Assistance: Maximum assistance Feeding assistance: Independent Dressing Assistance: Limited assistance     Functional Limitations Info  Sight, Hearing, Speech Sight Info: Adequate Hearing Info: Adequate Speech Info: Adequate    SPECIAL CARE FACTORS FREQUENCY  PT (By licensed PT), OT (By licensed OT)     PT Frequency: 5X/W OT Frequency: 5X/W            Contractures Contractures Info: Not present    Additional Factors Info  Code Status, Allergies Code Status Info: Full Allergies Info: Codeine, Hydroxyzine, Lorazapam, Penicillins, Sulfa Antibiotics, Zinc, Ciproflaxacin, Latex           Current Medications (05/06/2020):  This is the current hospital active medication list Current Facility-Administered Medications  Medication Dose Route Frequency Provider Last Rate Last Admin  . acetaminophen (TYLENOL) tablet 650 mg  650 mg Oral Q6H PRN Reubin Milan, MD   650 mg at 05/05/20 2107   Or  . acetaminophen (TYLENOL) suppository 650  mg  650 mg Rectal Q6H PRN Reubin Milan, MD      . amiodarone (PACERONE) tablet 100 mg  100 mg Oral Daily Pokhrel, Laxman, MD   100 mg at 05/06/20 1057  . cholecalciferol (VITAMIN D3) tablet 2,000 Units  2,000 Units Oral Daily Pokhrel, Laxman, MD   2,000 Units at 05/06/20 1056  . diclofenac Sodium  (VOLTAREN) 1 % topical gel 2 g  2 g Topical QID PRN Pokhrel, Laxman, MD   2 g at 05/05/20 0936  . donepezil (ARICEPT) tablet 5 mg  5 mg Oral QHS Pokhrel, Laxman, MD   5 mg at 05/05/20 2107  . gabapentin (NEURONTIN) capsule 300 mg  300 mg Oral BID Pokhrel, Laxman, MD   300 mg at 05/05/20 2107  . haloperidol (HALDOL) tablet 1 mg  1 mg Oral Q6H PRN Pokhrel, Laxman, MD       Or  . haloperidol lactate (HALDOL) injection 1 mg  1 mg Intramuscular Q6H PRN Pokhrel, Laxman, MD   1 mg at 04/26/20 1744  . hydrALAZINE (APRESOLINE) injection 10 mg  10 mg Intravenous Q4H PRN Rise Patience, MD      . hydrocortisone (ANUSOL-HC) 2.5 % rectal cream 1 application  1 application Topical QID PRN Pokhrel, Laxman, MD      . metoprolol succinate (TOPROL-XL) 24 hr tablet 25 mg  25 mg Oral Daily Pokhrel, Laxman, MD   25 mg at 05/06/20 1057  . nitroGLYCERIN (NITROSTAT) SL tablet 0.4 mg  0.4 mg Sublingual Q5 min PRN Pokhrel, Laxman, MD      . ondansetron (ZOFRAN) tablet 4 mg  4 mg Oral Q6H PRN Reubin Milan, MD       Or  . ondansetron [facility]) injection 4 mg  4 mg Intravenous Q6H PRN Reubin Milan, MD      . pantoprazole (PROTONIX) EC tablet 40 mg  40 mg Oral Daily Reuel Boom A, RPH   40 mg at 05/06/20 1056  . polyvinyl alcohol (LIQUIFILM TEARS) 1.4 % ophthalmic solution 1 drop  1 drop Both Eyes PRN Pokhrel, Laxman, MD      . QUEtiapine (SEROQUEL) tablet 50 mg  50 mg Oral QHS Pokhrel, Laxman, MD   50 mg at 05/05/20 2107  . rivaroxaban (XARELTO) tablet 20 mg  20 mg Oral Q supper Pokhrel, Laxman, MD   20 mg at 05/05/20 1718  . traMADol (ULTRAM) tablet 50 mg  50 mg Oral Q8H PRN Pokhrel, Laxman, MD   50 mg at 05/05/20 1506     Discharge Medications: Please see discharge summary for a list of discharge medications.  Relevant Imaging Results:  Relevant Lab Results:   Additional Information [place], [place]

## 2020-05-06 NOTE — TOC Transition Note (Signed)
Transition of Care [facility]) - CM/SW Discharge Note   Patient Details  Name: Jill Bowman MRN: [id number] Date of Birth: 1929/09/22  [facility] of [facility]) CM/SW Contact:  Trish Mage, LCSW Phone Number: 05/06/2020, 11:39 AM   Clinical Narrative:  Patient to transfer to [facility] today.  Family alerted. PTAR arranged. Nursing, please call report to [phone], room 128B. TOC sign off.     Final next level of care: Skilled Nursing Facility Barriers to Discharge: Barriers Resolved   Patient Goals and CMS Choice Patient states their goals for this hospitalization and ongoing recovery are:: "I am will to go to rehab."   Choice offered to / list presented to : Patient  Discharge Placement                       Discharge Plan and Services   Discharge Planning Services: CM Consult Post Acute Care Choice: [place]                               Social Determinants of Health (SDOH) Interventions     Readmission Risk Interventions No flowsheet data found.
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
## 2020-05-11 ENCOUNTER — Telehealth: Payer: Self-pay

## 2020-05-11 NOTE — Telephone Encounter (Signed)
Verbal orders given to [facility] for nursing PT/OT per JM [phone]

## 2020-05-25 ENCOUNTER — Other Ambulatory Visit: Payer: Self-pay

## 2020-05-25 ENCOUNTER — Ambulatory Visit (INDEPENDENT_AMBULATORY_CARE_PROVIDER_SITE_OTHER): Payer: Medicare Other | Admitting: Nurse Practitioner

## 2020-05-25 ENCOUNTER — Encounter: Payer: Self-pay | Admitting: Nurse Practitioner

## 2020-05-25 VITALS — BP 118/80 | HR 74 | Temp 98.5°F | Ht 60.2 in | Wt 215.4 lb

## 2020-05-25 DIAGNOSIS — I130 Hypertensive heart and chronic kidney disease with heart failure and stage 1 through stage 4 chronic kidney disease, or unspecified chronic kidney disease: Secondary | ICD-10-CM | POA: Diagnosis not present

## 2020-05-25 DIAGNOSIS — I2510 Atherosclerotic heart disease of native coronary artery without angina pectoris: Secondary | ICD-10-CM | POA: Diagnosis not present

## 2020-05-25 DIAGNOSIS — R7309 Other abnormal glucose: Secondary | ICD-10-CM

## 2020-05-25 DIAGNOSIS — R1084 Generalized abdominal pain: Secondary | ICD-10-CM

## 2020-05-25 DIAGNOSIS — M1732 Unilateral post-traumatic osteoarthritis, left knee: Secondary | ICD-10-CM | POA: Diagnosis not present

## 2020-05-25 DIAGNOSIS — E7800 Pure hypercholesterolemia, unspecified: Secondary | ICD-10-CM

## 2020-05-25 DIAGNOSIS — M25562 Pain in left knee: Secondary | ICD-10-CM | POA: Diagnosis not present

## 2020-05-25 DIAGNOSIS — G8929 Other chronic pain: Secondary | ICD-10-CM

## 2020-05-25 LAB — POCT URINALYSIS DIPSTICK
Bilirubin, UA: NEGATIVE
Blood, UA: NEGATIVE
Glucose, UA: NEGATIVE
Ketones, UA: NEGATIVE
Leukocytes, UA: NEGATIVE
Nitrite, UA: NEGATIVE
Protein, UA: POSITIVE — AB
Spec Grav, UA: >=1.03 — AB (ref 1.010–1.025)
Urobilinogen, UA: 0.2 E.U./dL
pH, UA: 5.5 (ref 5.0–8.0)
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
## 2020-05-25 NOTE — Patient Instructions (Addendum)
   Find out who the eye doctor is and let us know so we can get a copy of her last eye appt.   We also need the date of her 2nd Covid vaccine

## 2020-05-26 LAB — BMP8+EGFR
BUN/Creatinine Ratio: 17 (ref 12–28)
BUN: 17 mg/dL (ref 8–27)
CO2: 25 mmol/L (ref 20–29)
Calcium: 9.4 mg/dL (ref 8.7–10.3)
Chloride: 102 mmol/L (ref 96–106)
Creatinine, Ser: 1.02 mg/dL — ABNORMAL HIGH (ref 0.57–1.00)
GFR calc Af Amer: 56 mL/min/{1.73_m2} — ABNORMAL LOW (ref >59)
GFR calc non Af Amer: 49 mL/min/{1.73_m2} — ABNORMAL LOW (ref >59)
Glucose: 95 mg/dL (ref 65–99)
Potassium: 4.4 mmol/L (ref 3.5–5.2)
Sodium: 142 mmol/L (ref 134–144)

## 2020-05-26 LAB — CBC
Hematocrit: 36.4 % (ref 34.0–46.6)
Hemoglobin: 12.1 g/dL (ref 11.1–15.9)
MCH: 31.9 pg (ref 26.6–33.0)
MCHC: 33.2 g/dL (ref 31.5–35.7)
MCV: 96 fL (ref 79–97)
Platelets: 292 10*3/uL (ref 150–450)
RBC: 3.79 x10E6/uL (ref 3.77–5.28)
RDW: 13 % (ref 11.7–15.4)
WBC: 9.3 10*3/uL (ref 3.4–10.8)

## 2020-05-26 LAB — HEMOGLOBIN A1C
Est. average glucose Bld gHb Est-mCnc: 117 mg/dL
Hgb A1c MFr Bld: 5.7 % — ABNORMAL HIGH (ref 4.8–5.6)

## 2020-05-26 LAB — LIPID PANEL
Chol/HDL Ratio: 2.6 ratio (ref 0.0–4.4)
Cholesterol, Total: 133 mg/dL (ref 100–199)
HDL: 51 mg/dL (ref >39)
LDL Chol Calc (NIH): 58 mg/dL (ref 0–99)
Triglycerides: 141 mg/dL (ref 0–149)
VLDL Cholesterol Cal: 24 mg/dL (ref 5–40)

## 2020-05-26 LAB — MAGNESIUM: Magnesium: 1.9 mg/dL (ref 1.6–2.3)

## 2020-06-03 ENCOUNTER — Ambulatory Visit: Payer: Medicare Other | Admitting: Internal Medicine

## 2020-06-03 ENCOUNTER — Ambulatory Visit: Payer: Medicare Other | Admitting: Nurse Practitioner

## 2020-06-16 ENCOUNTER — Ambulatory Visit: Payer: Medicare Other | Admitting: Podiatry

## 2020-07-21 ENCOUNTER — Encounter: Payer: Self-pay | Admitting: Nurse Practitioner

## 2020-08-06 ENCOUNTER — Other Ambulatory Visit: Payer: Self-pay

## 2020-08-06 MED ORDER — AMIODARONE HCL 100 MG PO TABS: ORAL_TABLET | ORAL | 0 refills | Status: DC

## 2020-08-09 ENCOUNTER — Other Ambulatory Visit: Payer: Self-pay

## 2020-08-09 DIAGNOSIS — I495 Sick sinus syndrome: Secondary | ICD-10-CM

## 2020-08-09 MED ORDER — METOPROLOL SUCCINATE ER 25 MG PO TB24: 25.0000 mg | ORAL_TABLET | Freq: Every day | ORAL | 1 refills | Status: DC

## 2020-08-30 ENCOUNTER — Encounter: Payer: Self-pay | Admitting: Orthopaedic Surgery

## 2020-08-30 ENCOUNTER — Ambulatory Visit (INDEPENDENT_AMBULATORY_CARE_PROVIDER_SITE_OTHER): Payer: Medicare Other | Admitting: Orthopaedic Surgery

## 2020-08-30 DIAGNOSIS — R600 Localized edema: Secondary | ICD-10-CM | POA: Diagnosis not present

## 2020-08-30 DIAGNOSIS — M79605 Pain in left leg: Secondary | ICD-10-CM

## 2020-08-30 DIAGNOSIS — I2510 Atherosclerotic heart disease of native coronary artery without angina pectoris: Secondary | ICD-10-CM

## 2020-08-30 DIAGNOSIS — M79604 Pain in right leg: Secondary | ICD-10-CM

## 2020-08-30 NOTE — Progress Notes (Signed)
The patient comes in today with bilateral leg pain and swelling.  She is 86 years old and well-known to me.  Her daughter who is her healthcare advocate and power of attorney had needed [place] to take a look at her due to the pain she has been experiencing.  She has had cellulitis in the past.  She has a history of bilateral knee replacements as well.  She does have compression stockings at home but does not like to wear them because they are too tight.  Examination both legs show slight pitting edema in the lower legs but no trauma that I can see.  The knees and ankles move well.  There is no redness to suggest a cellulitis.  Apparently she is seeing her cardiologist next week.  I have recommended at least compression socks and that may help.  She is on 300 mg of gabapentin twice a day.  From a pain standpoint, she should increase this to 3 times a day since it has been well-tolerated.  She can also try topical Voltaren gel which is now over-the-counter.  All questions and concerns were answered and addressed.

## 2020-09-07 ENCOUNTER — Ambulatory Visit (INDEPENDENT_AMBULATORY_CARE_PROVIDER_SITE_OTHER): Payer: Medicare Other

## 2020-09-07 ENCOUNTER — Other Ambulatory Visit: Payer: Self-pay

## 2020-09-07 VITALS — BP 130/72 | HR 72 | Temp 98.0°F | Ht 60.2 in | Wt 214.6 lb

## 2020-09-07 DIAGNOSIS — Z23 Encounter for immunization: Secondary | ICD-10-CM | POA: Diagnosis not present

## 2020-09-07 NOTE — Telephone Encounter (Signed)
Fill request for Amiodarone

## 2020-09-07 NOTE — Progress Notes (Signed)
Pt here today for flu shot  

## 2020-09-08 MED ORDER — AMIODARONE HCL 100 MG PO TABS: ORAL_TABLET | ORAL | 0 refills | Status: DC

## 2020-09-09 ENCOUNTER — Other Ambulatory Visit: Payer: Self-pay | Admitting: Cardiology

## 2020-09-09 DIAGNOSIS — E7800 Pure hypercholesterolemia, unspecified: Secondary | ICD-10-CM

## 2020-09-17 ENCOUNTER — Ambulatory Visit (INDEPENDENT_AMBULATORY_CARE_PROVIDER_SITE_OTHER): Payer: Medicare Other | Admitting: Podiatry

## 2020-09-17 ENCOUNTER — Other Ambulatory Visit: Payer: Self-pay

## 2020-09-17 ENCOUNTER — Encounter: Payer: Self-pay | Admitting: Podiatry

## 2020-09-17 DIAGNOSIS — B351 Tinea unguium: Secondary | ICD-10-CM | POA: Diagnosis not present

## 2020-09-17 DIAGNOSIS — M79675 Pain in left toe(s): Secondary | ICD-10-CM

## 2020-09-17 DIAGNOSIS — M79674 Pain in right toe(s): Secondary | ICD-10-CM | POA: Diagnosis not present

## 2020-09-19 NOTE — Progress Notes (Signed)
Subjective: Jill Bowman presents today for follow up of painful mycotic nails b/l that are difficult to trim. Pain interferes with ambulation. Aggravating factors include wearing enclosed shoe gear. Pain is relieved with periodic professional debridement.   She voices no new pedal problems on today's visit.  Allergies  Allergen Reactions  . Codeine Itching, Rash and Other (See Comments)    Full body rash   . Hydroxyzine Other (See Comments)    Extreme confusion and hallucinations  . Lorazepam Other (See Comments)    Extreme confusion, hallucinations and hyperactivity  . Penicillins Anaphylaxis, Hives and Shortness Of Breath    Tolerated cefazolin and ceftriaxone in 2013  . Sulfa Antibiotics Shortness Of Breath  . Zinc Itching  . Ciprofloxacin Other (See Comments)    unknown  . Ciprofloxacin Hives and Other (See Comments)    "think I break out in welts"   . Latex Rash and Other (See Comments)    Tears skin   . Penicillins Other (See Comments)    Unknown   . Sulfa Antibiotics Other (See Comments)    unknown     Objective: There were no vitals filed for this visit.  Pt is a pleasant 86 y.o. year old AA female WD, WN in NAD. AAO x 3.   Vascular Examination:  Capillary fill time to digits <3 seconds b/l. Palpable DP pulses b/l. Faintly palpable PT pulses b/l. Pedal hair present b/l. Skin temperature gradient within normal limits b/l.  Dermatological Examination: Pedal skin with normal turgor, texture and tone bilaterally. No open wounds bilaterally. No interdigital macerations bilaterally. Toenails 1-5 b/l elongated, discolored, dystrophic, thickened, crumbly with subungual debris and tenderness to dorsal palpation. Hyperkeratotic lesion(s) L 3rd toe and R hallux.  No erythema, no edema, no drainage, no flocculence.  Musculoskeletal: Normal muscle strength 5/5 to all lower extremity muscle groups bilaterally. No pain crepitus or joint limitation noted with ROM b/l.  Hammertoes noted to the b/l lower extremities.  Neurological: Protective sensation intact 5/5 intact bilaterally with 10g monofilament b/l. Vibratory sensation intact b/l. Proprioception intact bilaterally. Babinski reflex negative b/l. Clonus negative b/l.  Assessment: 1. Pain due to onychomycosis of toenails of both feet    Plan: -Toenails 1-5 b/l were debrided in length and girth with sterile nail nippers and dremel without iatrogenic bleeding.  -Patient to continue soft, supportive shoe gear daily. -Patient to report any pedal injuries to medical professional immediately. -Patient/POA to call should there be question/concern in the interim.  Return in about 3 months (around 12/18/2020).

## 2020-09-23 ENCOUNTER — Other Ambulatory Visit: Payer: Self-pay | Admitting: Nurse Practitioner

## 2020-09-23 ENCOUNTER — Other Ambulatory Visit: Payer: Self-pay | Admitting: Cardiology

## 2020-09-23 DIAGNOSIS — I495 Sick sinus syndrome: Secondary | ICD-10-CM

## 2020-09-27 ENCOUNTER — Ambulatory Visit: Payer: Medicare Other | Admitting: Cardiology

## 2020-09-30 ENCOUNTER — Ambulatory Visit: Payer: Medicare Other | Admitting: Cardiology

## 2020-10-01 ENCOUNTER — Other Ambulatory Visit: Payer: Self-pay | Admitting: Cardiology

## 2020-10-01 DIAGNOSIS — I10 Essential (primary) hypertension: Secondary | ICD-10-CM

## 2020-10-06 ENCOUNTER — Encounter: Payer: Self-pay | Admitting: Cardiology

## 2020-10-06 ENCOUNTER — Ambulatory Visit: Payer: Medicare Other | Admitting: Cardiology

## 2020-10-06 ENCOUNTER — Other Ambulatory Visit: Payer: Self-pay

## 2020-10-06 ENCOUNTER — Telehealth: Payer: Self-pay | Admitting: Cardiology

## 2020-10-06 VITALS — BP 134/74 | HR 63 | Resp 16 | Ht 62.0 in | Wt 214.0 lb

## 2020-10-06 DIAGNOSIS — I495 Sick sinus syndrome: Secondary | ICD-10-CM

## 2020-10-06 DIAGNOSIS — I484 Atypical atrial flutter: Secondary | ICD-10-CM

## 2020-10-06 DIAGNOSIS — Z45018 Encounter for adjustment and management of other part of cardiac pacemaker: Secondary | ICD-10-CM

## 2020-10-06 DIAGNOSIS — I10 Essential (primary) hypertension: Secondary | ICD-10-CM

## 2020-10-06 DIAGNOSIS — Z950 Presence of cardiac pacemaker: Secondary | ICD-10-CM
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
## 2020-10-06 NOTE — Addendum Note (Signed)
Addended by: Kela Millin on: 10/06/2020 03:03 PM   Modules accepted: Level of Service

## 2020-10-07 ENCOUNTER — Ambulatory Visit (INDEPENDENT_AMBULATORY_CARE_PROVIDER_SITE_OTHER): Payer: Medicare Other | Admitting: Nurse Practitioner

## 2020-10-07 ENCOUNTER — Ambulatory Visit (INDEPENDENT_AMBULATORY_CARE_PROVIDER_SITE_OTHER): Payer: Medicare Other

## 2020-10-07 ENCOUNTER — Ambulatory Visit: Payer: Medicare Other | Admitting: Internal Medicine

## 2020-10-07 VITALS — BP 116/80 | HR 73 | Temp 98.0°F | Ht 62.0 in | Wt 214.0 lb

## 2020-10-07 DIAGNOSIS — E559 Vitamin D deficiency, unspecified: Secondary | ICD-10-CM

## 2020-10-07 DIAGNOSIS — R350 Frequency of micturition: Secondary | ICD-10-CM | POA: Diagnosis not present

## 2020-10-07 DIAGNOSIS — R7309 Other abnormal glucose: Secondary | ICD-10-CM

## 2020-10-07 DIAGNOSIS — Z8739 Personal history of other diseases of the musculoskeletal system and connective tissue: Secondary | ICD-10-CM

## 2020-10-07 DIAGNOSIS — I10 Essential (primary) hypertension: Secondary | ICD-10-CM

## 2020-10-07 DIAGNOSIS — Z0000 Encounter for general adult medical examination without abnormal findings: Secondary | ICD-10-CM | POA: Diagnosis not present

## 2020-10-07 DIAGNOSIS — Z6839 Body mass index (BMI) 39.0-39.9, adult: Secondary | ICD-10-CM

## 2020-10-07 DIAGNOSIS — E538 Deficiency of other specified B group vitamins: Secondary | ICD-10-CM

## 2020-10-07 DIAGNOSIS — E669 Obesity, unspecified: Secondary | ICD-10-CM

## 2020-10-07 DIAGNOSIS — E782 Mixed hyperlipidemia: Secondary | ICD-10-CM | POA: Diagnosis not present

## 2020-10-07 LAB — POCT URINALYSIS DIPSTICK
Blood, UA: NEGATIVE
Glucose, UA: NEGATIVE
Nitrite, UA: NEGATIVE
Protein, UA: POSITIVE — AB
Spec Grav, UA: 1.025 (ref 1.010–1.025)
Urobilinogen, UA: 0.2 E.U./dL
pH, UA: 5.5 (ref 5.0–8.0)

## 2020-10-07 LAB — POCT UA - MICROALBUMIN
Albumin/Creatinine Ratio, Urine, POC: <30
Creatinine, POC: 200 mg/dL
Microalbumin Ur, POC: 30 mg/L
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
## 2020-10-07 NOTE — Patient Instructions (Signed)
Jill Bowman , Thank you for taking time to come for your Medicare Wellness Visit. I appreciate your ongoing commitment to your health goals. Please review the following plan we discussed and let me know if I can assist you in the future.   Screening recommendations/referrals: Colonoscopy: not required Mammogram: not required Bone Density: decline Recommended yearly ophthalmology/optometry visit for glaucoma screening and checkup Recommended yearly dental visit for hygiene and checkup  Vaccinations: Influenza vaccine: completed 09/07/2020, due 06/27/2021 Pneumococcal vaccine: decline Tdap vaccine: completed 08/07/2018, due 08/07/2028 Shingles vaccine: discussed   Covid-19: 02/21/2020, 03/20/2020, 09/23/2020  Advanced directives: Please bring a copy of your POA (Power of Attorney) and/or Living Will to your next appointment.   Conditions/risks identified: none  Next appointment: Follow up in one year for your annual wellness visit    Preventive Care 65 Years and Older, Female Preventive care refers to lifestyle choices and visits with your health care provider that can promote health and wellness. What does preventive care include?  A yearly physical exam. This is also called an annual well check.  Dental exams once or twice a year.  Routine eye exams. Ask your health care provider how often you should have your eyes checked.  Personal lifestyle choices, including:  Daily care of your teeth and gums.  Regular physical activity.  Eating a healthy diet.  Avoiding tobacco and drug use.  Limiting alcohol use.  Practicing safe sex.  Taking low-dose aspirin every day.  Taking vitamin and mineral supplements as recommended by your health care provider. What happens during an annual well check? The services and screenings done by your health care provider during your annual well check will depend on your age, overall health, lifestyle risk factors, and family history of  disease. Counseling  Your health care provider may ask you questions about your:  Alcohol use.  Tobacco use.  Drug use.  Emotional well-being.  Home and relationship well-being.  Sexual activity.  Eating habits.  History of falls.  Memory and ability to understand (cognition).  Work and work Statistician.  Reproductive health. Screening  You may have the following tests or measurements:  Height, weight, and BMI.  Blood pressure.  Lipid and cholesterol levels. These may be checked every 5 years, or more frequently if you are over 95 years old.  Skin check.  Lung cancer screening. You may have this screening every year starting at age 35 if you have a 30-pack-year history of smoking and currently smoke or have quit within the past 15 years.  Fecal occult blood test (FOBT) of the stool. You may have this test every year starting at age 51.  Flexible sigmoidoscopy or colonoscopy. You may have a sigmoidoscopy every 5 years or a colonoscopy every 10 years starting at age 82.  Hepatitis C blood test.  Hepatitis B blood test.  Sexually transmitted disease (STD) testing.  Diabetes screening. This is done by checking your blood sugar (glucose) after you have not eaten for a while (fasting). You may have this done every 1-3 years.  Bone density scan. This is done to screen for osteoporosis. You may have this done starting at age 28.  Mammogram. This may be done every 1-2 years. Talk to your health care provider about how often you should have regular mammograms. Talk with your health care provider about your test results, treatment options, and if necessary, the need for more tests. Vaccines  Your health care provider may recommend certain vaccines, such as:  Influenza vaccine. This  is recommended every year.  Tetanus, diphtheria, and acellular pertussis (Tdap, Td) vaccine. You may need a Td booster every 10 years.  Zoster vaccine. You may need this after age  83.  Pneumococcal 13-valent conjugate (PCV13) vaccine. One dose is recommended after age 49.  Pneumococcal polysaccharide (PPSV23) vaccine. One dose is recommended after age 21. Talk to your health care provider about which screenings and vaccines you need and how often you need them. This information is not intended to replace advice given to you by your health care provider. Make sure you discuss any questions you have with your health care provider. Document Released: 12/10/2015 Document Revised: 08/02/2016 Document Reviewed: 09/14/2015 Elsevier Interactive Patient Education  2017 [place] Prevention in the Home Falls can cause injuries. They can happen to people of all ages. There are many things you can do to make your home safe and to help prevent falls. What can I do on the outside of my home?  Regularly fix the edges of walkways and driveways and fix any cracks.  Remove anything that might make you trip as you walk through a door, such as a raised step or threshold.  Trim any bushes or trees on the path to your home.  Use bright outdoor lighting.  Clear any walking paths of anything that might make someone trip, such as rocks or tools.  Regularly check to see if handrails are loose or broken. Make sure that both sides of any steps have handrails.  Any raised decks and porches should have guardrails on the edges.  Have any leaves, snow, or ice cleared regularly.  Use sand or salt on walking paths during winter.  Clean up any spills in your garage right away. This includes oil or grease spills. What can I do in the bathroom?  Use night lights.  Install grab bars by the toilet and in the tub and shower. Do not use towel bars as grab bars.  Use non-skid mats or decals in the tub or shower.  If you need to sit down in the shower, use a plastic, non-slip stool.  Keep the floor dry. Clean up any water that spills on the floor as soon as it happens.  Remove  soap buildup in the tub or shower regularly.  Attach bath mats securely with double-sided non-slip rug tape.  Do not have throw rugs and other things on the floor that can make you trip. What can I do in the bedroom?  Use night lights.  Make sure that you have a light by your bed that is easy to reach.  Do not use any sheets or blankets that are too big for your bed. They should not hang down onto the floor.  Have a firm chair that has side arms. You can use this for support while you get dressed.  Do not have throw rugs and other things on the floor that can make you trip. What can I do in the kitchen?  Clean up any spills right away.  Avoid walking on wet floors.  Keep items that you use a lot in easy-to-reach places.  If you need to reach something above you, use a strong step stool that has a grab bar.  Keep electrical cords out of the way.  Do not use floor polish or wax that makes floors slippery. If you must use wax, use non-skid floor wax.  Do not have throw rugs and other things on the floor that can make you  trip. What can I do with my stairs?  Do not leave any items on the stairs.  Make sure that there are handrails on both sides of the stairs and use them. Fix handrails that are broken or loose. Make sure that handrails are as long as the stairways.  Check any carpeting to make sure that it is firmly attached to the stairs. Fix any carpet that is loose or worn.  Avoid having throw rugs at the top or bottom of the stairs. If you do have throw rugs, attach them to the floor with carpet tape.  Make sure that you have a light switch at the top of the stairs and the bottom of the stairs. If you do not have them, ask someone to add them for you. What else can I do to help prevent falls?  Wear shoes that:  Do not have high heels.  Have rubber bottoms.  Are comfortable and fit you well.  Are closed at the toe. Do not wear sandals.  If you use a  stepladder:  Make sure that it is fully opened. Do not climb a closed stepladder.  Make sure that both sides of the stepladder are locked into place.  Ask someone to hold it for you, if possible.  Clearly mark and make sure that you can see:  Any grab bars or handrails.  First and last steps.  Where the edge of each step is.  Use tools that help you move around (mobility aids) if they are needed. These include:  Canes.  Walkers.  Scooters.  Crutches.  Turn on the lights when you go into a dark area. Replace any light bulbs as soon as they burn out.  Set up your furniture so you have a clear path. Avoid moving your furniture around.  If any of your floors are uneven, fix them.  If there are any pets around you, be aware of where they are.  Review your medicines with your doctor. Some medicines can make you feel dizzy. This can increase your chance of falling. Ask your doctor what other things that you can do to help prevent falls. This information is not intended to replace advice given to you by your health care provider. Make sure you discuss any questions you have with your health care provider. Document Released: 09/09/2009 Document Revised: 04/20/2016 Document Reviewed: 12/18/2014 Elsevier Interactive Patient Education  2017 [employer].

## 2020-10-08 LAB — CMP14+EGFR
ALT: 12 IU/L (ref 0–32)
AST: 16 IU/L (ref 0–40)
Albumin/Globulin Ratio: 1.3 (ref 1.2–2.2)
Albumin: 4 g/dL (ref 3.6–4.6)
Alkaline Phosphatase: 123 IU/L — ABNORMAL HIGH (ref 44–121)
BUN/Creatinine Ratio: 11 — ABNORMAL LOW (ref 12–28)
BUN: 15 mg/dL (ref 8–27)
Bilirubin Total: 0.3 mg/dL (ref 0.0–1.2)
CO2: 25 mmol/L (ref 20–29)
Calcium: 9.1 mg/dL (ref 8.7–10.3)
Chloride: 104 mmol/L (ref 96–106)
Creatinine, Ser: 1.35 mg/dL — ABNORMAL HIGH (ref 0.57–1.00)
GFR calc Af Amer: 40 mL/min/{1.73_m2} — ABNORMAL LOW (ref >59)
GFR calc non Af Amer: 35 mL/min/{1.73_m2} — ABNORMAL LOW (ref >59)
Globulin, Total: 3 g/dL (ref 1.5–4.5)
Glucose: 88 mg/dL (ref 65–99)
Potassium: 4.7 mmol/L (ref 3.5–5.2)
Sodium: 140 mmol/L (ref 134–144)
Total Protein: 7 g/dL (ref 6.0–8.5)

## 2020-10-08 LAB — VITAMIN D 25 HYDROXY (VIT D DEFICIENCY, FRACTURES): Vit D, 25-Hydroxy: 54.7 ng/mL (ref 30.0–100.0)

## 2020-10-08 LAB — URIC ACID: Uric Acid: 6.1 mg/dL (ref 3.1–7.9)

## 2020-10-08 LAB — HEMOGLOBIN A1C
Est. average glucose Bld gHb Est-mCnc: 117 mg/dL
Hgb A1c MFr Bld: 5.7 % — ABNORMAL HIGH (ref 4.8–5.6)

## 2020-10-08 LAB — VITAMIN B12: Vitamin B-12: 624 pg/mL (ref 232–1245)

## 2020-10-11 LAB — URINE CULTURE

## 2020-10-11 NOTE — Telephone Encounter (Signed)
Noted, will add the CBC

## 2020-10-13 ENCOUNTER — Encounter: Payer: Medicare Other | Admitting: Cardiology

## 2020-10-14 ENCOUNTER — Encounter: Payer: Self-pay | Admitting: Nurse Practitioner

## 2020-10-14 ENCOUNTER — Telehealth: Payer: Self-pay

## 2020-10-14 NOTE — Telephone Encounter (Signed)
The patient's daughter Mrs. Porteous called and wanted to know what the pt's lab results are because Dr. Einar Gip took her off of her vitamins and said she didn't need them because the pt is on a lot of medications unless she has a deficiency on something.  Mrs Cherlyn Cushing said that she was asked when the last time the pt had a bone density and that the chart says that the pt's daughter denied a bone density but that if she needed to have one done she would and like to know if the pt can be transferred back to Dr. Baird Cancer, that she can get a reply in [place] or leave a message on the home phone. She also said she wanted to know if the pt needed a shingles shot.

## 2020-10-15 ENCOUNTER — Other Ambulatory Visit: Payer: Self-pay

## 2020-10-15 MED ORDER — ZOSTER VAC RECOMB ADJUVANTED 50 MCG/0.5ML IM SUSR: 0.5000 mL | Freq: Once | INTRAMUSCULAR | 0 refills | Status: AC

## 2020-10-15 NOTE — Telephone Encounter (Signed)
She hasn't had one and the rx was faxed to the pharmacy.
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
## 2020-10-18 ENCOUNTER — Telehealth: Payer: Self-pay

## 2020-10-18 ENCOUNTER — Encounter: Payer: Self-pay | Admitting: Adult Health

## 2020-10-18 ENCOUNTER — Ambulatory Visit (INDEPENDENT_AMBULATORY_CARE_PROVIDER_SITE_OTHER): Payer: Medicare Other | Admitting: Adult Health

## 2020-10-18 ENCOUNTER — Other Ambulatory Visit: Payer: Self-pay

## 2020-10-18 VITALS — BP 118/74 | HR 72 | Ht 62.0 in | Wt 207.0 lb

## 2020-10-18 DIAGNOSIS — F0281 Dementia in other diseases classified elsewhere with behavioral disturbance: Secondary | ICD-10-CM

## 2020-10-18 DIAGNOSIS — G309 Alzheimer's disease, unspecified: Secondary | ICD-10-CM | POA: Diagnosis not present

## 2020-10-18 DIAGNOSIS — R8290 Unspecified abnormal findings in urine: Secondary | ICD-10-CM

## 2020-10-18 DIAGNOSIS — Z45018 Encounter for adjustment and management of other part of cardiac pacemaker: Secondary | ICD-10-CM

## 2020-10-18 DIAGNOSIS — I2510 Atherosclerotic heart disease of native coronary artery without angina pectoris: Secondary | ICD-10-CM

## 2020-10-18 DIAGNOSIS — Z950 Presence of cardiac pacemaker: Secondary | ICD-10-CM

## 2020-10-18 DIAGNOSIS — E2839 Other primary ovarian failure: Secondary | ICD-10-CM

## 2020-10-18 MED ORDER — DONEPEZIL HCL 10 MG PO TABS: 10.0000 mg | ORAL_TABLET | Freq: Every day | ORAL | 11 refills | Status: DC

## 2020-10-18 NOTE — Telephone Encounter (Signed)
Abnormal RV threshold noted on the remote transmission.  Will schedule for chest x-ray and also device check in the office.  Patient is not pacemaker dependent.

## 2020-10-18 NOTE — Telephone Encounter (Signed)
Set up for pacemaker check in the office tomorrow. I will be in early probably by 11 am. I also ordered CXR, please have patient do it tomorrow.

## 2020-10-18 NOTE — Patient Instructions (Signed)
Your Plan:  Continue aricept Memory score 16/30 If your symptoms worsen or you develop new symptoms please let us know.   Thank you for coming to see [place] at [facility]. I hope we have been able to provide you high quality care today.  You may receive a patient satisfaction survey over the next few weeks. We would appreciate your feedback and comments so that we may continue to improve ourselves and the health of our patients.

## 2020-10-18 NOTE — Telephone Encounter (Signed)
Lelaine from [employer] with emergent call on patient:  RV threshold output has increased tremendously from 7.5 v @ 1.5 millisecond  Lead impedence has increased from 524 ohms to 360 ohms.  Also episode of noise in ventricular lead.   Email has been sent out.

## 2020-10-19 ENCOUNTER — Other Ambulatory Visit: Payer: Self-pay

## 2020-10-19 ENCOUNTER — Ambulatory Visit: Payer: Medicare Other | Admitting: Cardiology

## 2020-10-19 DIAGNOSIS — Z45018 Encounter for adjustment and management of other part of cardiac pacemaker: Secondary | ICD-10-CM

## 2020-10-19 DIAGNOSIS — I495 Sick sinus syndrome: Secondary | ICD-10-CM

## 2020-10-19 DIAGNOSIS — Z950 Presence of cardiac pacemaker: Secondary | ICD-10-CM

## 2020-10-19 NOTE — Progress Notes (Signed)
Chief Complaint  Patient presents with  . Pacemaker Check    Alert for high V lead threshold     Scheduled  In office pacemaker check 10/19/20  Single (S)/Dual (D)/BV: D. Presenting VVI @ 70/min at max output. Pacemaker dependant:  No. Underlying SR 59/min. AP 0%, VP 87.2%.  AMS Episodes NA.  A lead was turned off HVR 62. Longest Noise from V lead. Since 10/07/2020 Longevity 3.5 Years. Magnet rate: >85%. Lead measurements: Stable. Histogram: Low (L)/normal (N)/high (H)  Normal. Patient activity Low.   Observations: ?Accidental change to VVI mode and unipolar since 10/07/2020. All thresholds and impedance normal. Pocket manipulation was negative for noise.   Changes: Switched to AAIR, 55 to 120 beat/min. A lead set at 2.5 at 0.4 S, RV 2.75 at 0.4 S and changed RV from unipolar to bipolar.  Continue remote monitoring. Suspect noise due to unipolar conversion.   Will cancel CXR.   Adrian Prows, MD, [facility] 10/19/2020, 1:12 PM Office: [phone] Pager: [phone]

## 2020-10-19 NOTE — Telephone Encounter (Signed)
Patient daughter is bringing her in today.   Admin: Please put her on the schedule when she gets here for just a pacer check.

## 2020-10-22 LAB — URINE CULTURE

## 2020-10-22 LAB — SPECIMEN STATUS REPORT

## 2020-10-24 ENCOUNTER — Other Ambulatory Visit: Payer: Self-pay | Admitting: Nurse Practitioner

## 2020-10-24 MED ORDER — DOXYCYCLINE HYCLATE 100 MG PO CAPS: 100.0000 mg | ORAL_CAPSULE | Freq: Two times a day (BID) | ORAL | 0 refills | Status: DC

## 2020-10-24 NOTE — Telephone Encounter (Signed)
Okay thank you

## 2020-10-26 ENCOUNTER — Encounter: Payer: Self-pay | Admitting: Nurse Practitioner

## 2020-10-26 ENCOUNTER — Ambulatory Visit: Payer: Medicare Other | Admitting: Cardiology

## 2020-10-29 ENCOUNTER — Other Ambulatory Visit: Payer: Self-pay | Admitting: Nurse Practitioner

## 2020-10-29 DIAGNOSIS — E2839 Other primary ovarian failure: Secondary | ICD-10-CM

## 2020-11-02 ENCOUNTER — Other Ambulatory Visit: Payer: Self-pay

## 2020-11-02 DIAGNOSIS — E2839 Other primary ovarian failure: Secondary | ICD-10-CM

## 2020-11-15 ENCOUNTER — Telehealth: Payer: Self-pay

## 2020-11-15 ENCOUNTER — Encounter: Payer: Self-pay | Admitting: Adult Health

## 2020-11-15 MED ORDER — QUETIAPINE FUMARATE 50 MG PO TABS
50.0000 mg | ORAL_TABLET | Freq: Every day | ORAL | 0 refills | Status: DC
Start: 2020-11-15 — End: 2021-02-03

## 2020-11-15 MED ORDER — GABAPENTIN 300 MG PO CAPS: ORAL_CAPSULE | ORAL | 1 refills | Status: DC

## 2020-11-15 NOTE — Telephone Encounter (Signed)
Patient requests Gabapentin 300mg  one by mouth twice a day [employer]

## 2020-11-24 ENCOUNTER — Other Ambulatory Visit: Payer: Self-pay

## 2020-11-24 ENCOUNTER — Emergency Department (HOSPITAL_COMMUNITY): Payer: Medicare Other

## 2020-11-24 ENCOUNTER — Encounter (HOSPITAL_COMMUNITY): Payer: Self-pay | Admitting: Emergency Medicine

## 2020-11-24 ENCOUNTER — Emergency Department (HOSPITAL_COMMUNITY)
Admission: EM | Admit: 2020-11-24 | Discharge: 2020-11-24 | Disposition: A | Discharge status: Home / Self Care | Payer: Medicare Other | Attending: Emergency Medicine | Admitting: Emergency Medicine

## 2020-11-24 DIAGNOSIS — J45909 Unspecified asthma, uncomplicated: Secondary | ICD-10-CM | POA: Diagnosis not present

## 2020-11-24 DIAGNOSIS — N183 Chronic kidney disease, stage 3 unspecified: Secondary | ICD-10-CM | POA: Insufficient documentation

## 2020-11-24 DIAGNOSIS — E1122 Type 2 diabetes mellitus with diabetic chronic kidney disease: Secondary | ICD-10-CM | POA: Insufficient documentation

## 2020-11-24 DIAGNOSIS — R0789 Other chest pain: Secondary | ICD-10-CM | POA: Insufficient documentation

## 2020-11-24 DIAGNOSIS — R079 Chest pain, unspecified: Secondary | ICD-10-CM | POA: Diagnosis present

## 2020-11-24 DIAGNOSIS — F0390 Unspecified dementia without behavioral disturbance: Secondary | ICD-10-CM | POA: Diagnosis not present

## 2020-11-24 DIAGNOSIS — Z950 Presence of cardiac pacemaker: Secondary | ICD-10-CM | POA: Diagnosis not present

## 2020-11-24 DIAGNOSIS — Z91040 Latex allergy status: Secondary | ICD-10-CM | POA: Insufficient documentation

## 2020-11-24 DIAGNOSIS — Z7901 Long term (current) use of anticoagulants: Secondary | ICD-10-CM | POA: Insufficient documentation

## 2020-11-24 DIAGNOSIS — Z79899 Other long term (current) drug therapy: Secondary | ICD-10-CM | POA: Diagnosis not present

## 2020-11-24 DIAGNOSIS — I2510 Atherosclerotic heart disease of native coronary artery without angina pectoris: Secondary | ICD-10-CM | POA: Insufficient documentation

## 2020-11-24 DIAGNOSIS — Z87891 Personal history of nicotine dependence: Secondary | ICD-10-CM | POA: Insufficient documentation

## 2020-11-24 DIAGNOSIS — Z96653 Presence of artificial knee joint, bilateral: Secondary | ICD-10-CM | POA: Insufficient documentation

## 2020-11-24 DIAGNOSIS — I119 Hypertensive heart disease without heart failure: Secondary | ICD-10-CM | POA: Insufficient documentation

## 2020-11-24 DIAGNOSIS — K5792 Diverticulitis of intestine, part unspecified, without perforation or abscess without bleeding: Secondary | ICD-10-CM | POA: Insufficient documentation

## 2020-11-24 DIAGNOSIS — E119 Type 2 diabetes mellitus without complications: Secondary | ICD-10-CM | POA: Diagnosis not present

## 2020-11-24 LAB — URINALYSIS, ROUTINE W REFLEX MICROSCOPIC
Bilirubin Urine: NEGATIVE
Glucose, UA: NEGATIVE mg/dL
Hgb urine dipstick: NEGATIVE
Ketones, ur: NEGATIVE mg/dL
Leukocytes,Ua: NEGATIVE
Nitrite: NEGATIVE
Protein, ur: NEGATIVE mg/dL
Specific Gravity, Urine: 1.01 (ref 1.005–1.030)
pH: 8 (ref 5.0–8.0)

## 2020-11-24 LAB — CBC
HCT: 36.7 % (ref 36.0–46.0)
Hemoglobin: 12 g/dL (ref 12.0–15.0)
MCH: 32.6 pg (ref 26.0–34.0)
MCHC: 32.7 g/dL (ref 30.0–36.0)
MCV: 99.7 fL (ref 80.0–100.0)
Platelets: 226 10*3/uL (ref 150–400)
RBC: 3.68 MIL/uL — ABNORMAL LOW (ref 3.87–5.11)
RDW: 13.3 % (ref 11.5–15.5)
WBC: 6.9 10*3/uL (ref 4.0–10.5)
nRBC: 0 % (ref 0.0–0.2)

## 2020-11-24 LAB — HEPATIC FUNCTION PANEL
ALT: 10 U/L (ref 0–44)
AST: 20 U/L (ref 15–41)
Albumin: 3.2 g/dL — ABNORMAL LOW (ref 3.5–5.0)
Alkaline Phosphatase: 93 U/L (ref 38–126)
Bilirubin, Direct: <0.1 mg/dL (ref 0.0–0.2)
Total Bilirubin: 0.4 mg/dL (ref 0.3–1.2)
Total Protein: 6.7 g/dL (ref 6.5–8.1)

## 2020-11-24 LAB — BASIC METABOLIC PANEL
Anion gap: 9 (ref 5–15)
BUN: 13 mg/dL (ref 8–23)
CO2: 25 mmol/L (ref 22–32)
Calcium: 8.9 mg/dL (ref 8.9–10.3)
Chloride: 107 mmol/L (ref 98–111)
Creatinine, Ser: 1.14 mg/dL — ABNORMAL HIGH (ref 0.44–1.00)
GFR, Estimated: 46 mL/min — ABNORMAL LOW (ref >60)
Glucose, Bld: 91 mg/dL (ref 70–99)
Potassium: 4 mmol/L (ref 3.5–5.1)
Sodium: 141 mmol/L (ref 135–145)

## 2020-11-24 LAB — LIPASE, BLOOD: Lipase: 28 U/L (ref 11–51)

## 2020-11-24 LAB — TROPONIN I (HIGH SENSITIVITY): Troponin I (High Sensitivity): 7 ng/L (ref <18)

## 2020-11-24 MED ORDER — MOXIFLOXACIN HCL 400 MG PO TABS: 400.0000 mg | ORAL_TABLET | Freq: Every day | ORAL | 0 refills | Status: AC

## 2020-11-24 MED ORDER — ONDANSETRON HCL 4 MG/2ML IJ SOLN: 4.0000 mg | Freq: Once | INTRAMUSCULAR | Status: DC

## 2020-11-24 MED ORDER — ONDANSETRON HCL 4 MG PO TABS: 4.0000 mg | ORAL_TABLET | Freq: Three times a day (TID) | ORAL | 0 refills | Status: DC | PRN

## 2020-11-24 MED ORDER — IOHEXOL 300 MG/ML  SOLN
100.0000 mL | Freq: Once | INTRAMUSCULAR | Status: AC | PRN
  Administered 2020-11-24: 100 mL via INTRAVENOUS

## 2020-11-24 NOTE — ED Provider Notes (Signed)
Assumed care of patient at 3 PM.  Patient here with that appears to be more abdominal pain.  She is has some issues with her pacemaker but cardiac work-up is unremarkable.  Awaiting abdominal work-up with abdominal labs and CT scan abdomen and pelvis.  She has had a lot of gas and burping.  She has had some major abdominal surgeries in the past.  Will evaluate for bowel obstruction versus ileus versus other intra-abdominal process.  Urinalysis negative for infection.  CT scan of the abdomen pelvis showed possibly an area of acute diverticulitis.  However no perforation or abscess.  Stable findings to paraesophageal hernia.  Overall no acute obstruction.  Will prescribe moxifloxacin after discussion with family and review of her antibiotic allergies.  She has had ciprofloxacin with some mild rash but no anaphylaxis symptoms.  Therefore believe that moxifloxacin would be the most appropriate antibiotic given her other allergies.  Will prescribe her Zofran.  Patient does have an EpiPen as she has had anaphylaxis in the past with penicillins.  Discussed with her about allergy symptoms to be concerned about and when to give Benadryl and when to give EpiPen and also given return precautions.  Discharged in ED in good condition.  Recommend liquid diet and slowly advance diet as tolerated.  However patient not having any difficulty with nausea and vomiting at this time.  Discharged in good condition.  This chart was dictated using voice recognition software.  Despite best efforts to proofread,  errors can occur which can change the documentation meaning.     Virgina Norfolk, DO 11/24/20 1905

## 2020-11-24 NOTE — Discharge Instructions (Addendum)
Take antibiotic for your diverticulitis.  Take Zofran as needed for any nausea and vomiting.  Please if you develop any rash or hives take Benadryl and talk with your primary care doctor about possibly discontinuing antibiotic.  If you have any other severe allergies use your EpiPen as we discussed.  Please return to the emergency department if symptoms worsen.  Please keep a simple diet as discussed as well and advance as tolerated.

## 2020-11-24 NOTE — ED Triage Notes (Signed)
Pt here from home with c/o pain around her pacemaker site , times 3 days , she had some adjustments done to her pacemaker  recently
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
## 2020-11-24 NOTE — ED Notes (Signed)
Tried getting blood X2 with no successful attempt.

## 2020-11-25 LAB — URINE CULTURE

## 2020-11-30 ENCOUNTER — Other Ambulatory Visit: Payer: Self-pay | Admitting: Cardiology

## 2020-12-02 ENCOUNTER — Emergency Department (HOSPITAL_COMMUNITY)
Admission: EM | Admit: 2020-12-02 | Discharge: 2020-12-02 | Disposition: A | Discharge status: Home / Self Care | Payer: Medicare Other | Attending: Emergency Medicine | Admitting: Emergency Medicine

## 2020-12-02 ENCOUNTER — Encounter: Payer: Self-pay | Admitting: Nurse Practitioner

## 2020-12-02 ENCOUNTER — Other Ambulatory Visit: Payer: Self-pay

## 2020-12-02 ENCOUNTER — Encounter (HOSPITAL_COMMUNITY): Payer: Self-pay

## 2020-12-02 ENCOUNTER — Emergency Department (HOSPITAL_COMMUNITY): Payer: Medicare Other

## 2020-12-02 DIAGNOSIS — N183 Chronic kidney disease, stage 3 unspecified: Secondary | ICD-10-CM | POA: Diagnosis not present

## 2020-12-02 DIAGNOSIS — I129 Hypertensive chronic kidney disease with stage 1 through stage 4 chronic kidney disease, or unspecified chronic kidney disease: Secondary | ICD-10-CM | POA: Insufficient documentation

## 2020-12-02 DIAGNOSIS — Z79899 Other long term (current) drug therapy: Secondary | ICD-10-CM | POA: Diagnosis not present

## 2020-12-02 DIAGNOSIS — Z96653 Presence of artificial knee joint, bilateral: Secondary | ICD-10-CM | POA: Diagnosis not present

## 2020-12-02 DIAGNOSIS — Z955 Presence of coronary angioplasty implant and graft: Secondary | ICD-10-CM | POA: Diagnosis not present

## 2020-12-02 DIAGNOSIS — I2510 Atherosclerotic heart disease of native coronary artery without angina pectoris: Secondary | ICD-10-CM | POA: Insufficient documentation

## 2020-12-02 DIAGNOSIS — E1122 Type 2 diabetes mellitus with diabetic chronic kidney disease: Secondary | ICD-10-CM | POA: Insufficient documentation

## 2020-12-02 DIAGNOSIS — J45909 Unspecified asthma, uncomplicated: Secondary | ICD-10-CM | POA: Diagnosis not present

## 2020-12-02 DIAGNOSIS — Z87891 Personal history of nicotine dependence: Secondary | ICD-10-CM | POA: Diagnosis not present

## 2020-12-02 DIAGNOSIS — M25512 Pain in left shoulder: Secondary | ICD-10-CM | POA: Diagnosis not present

## 2020-12-02 DIAGNOSIS — Z950 Presence of cardiac pacemaker: Secondary | ICD-10-CM | POA: Diagnosis not present

## 2020-12-02 DIAGNOSIS — R52 Pain, unspecified: Secondary | ICD-10-CM

## 2020-12-02 DIAGNOSIS — G301 Alzheimer's disease with late onset: Secondary | ICD-10-CM | POA: Diagnosis not present

## 2020-12-02 DIAGNOSIS — Z91040 Latex allergy status: Secondary | ICD-10-CM | POA: Insufficient documentation

## 2020-12-02 LAB — BASIC METABOLIC PANEL
Anion gap: 9 (ref 5–15)
BUN: 10 mg/dL (ref 8–23)
CO2: 24 mmol/L (ref 22–32)
Calcium: 8.5 mg/dL — ABNORMAL LOW (ref 8.9–10.3)
Chloride: 107 mmol/L (ref 98–111)
Creatinine, Ser: 1.15 mg/dL — ABNORMAL HIGH (ref 0.44–1.00)
GFR, Estimated: 46 mL/min — ABNORMAL LOW (ref >60)
Glucose, Bld: 74 mg/dL (ref 70–99)
Potassium: 4.1 mmol/L (ref 3.5–5.1)
Sodium: 140 mmol/L (ref 135–145)

## 2020-12-02 LAB — CBC WITH DIFFERENTIAL/PLATELET
Abs Immature Granulocytes: 0.02 10*3/uL (ref 0.00–0.07)
Basophils Absolute: 0 10*3/uL (ref 0.0–0.1)
Basophils Relative: 1 %
Eosinophils Absolute: 0.2 10*3/uL (ref 0.0–0.5)
Eosinophils Relative: 3 %
HCT: 36.3 % (ref 36.0–46.0)
Hemoglobin: 12 g/dL (ref 12.0–15.0)
Immature Granulocytes: 0 %
Lymphocytes Relative: 28 %
Lymphs Abs: 1.7 10*3/uL (ref 0.7–4.0)
MCH: 33.3 pg (ref 26.0–34.0)
MCHC: 33.1 g/dL (ref 30.0–36.0)
MCV: 100.8 fL — ABNORMAL HIGH (ref 80.0–100.0)
Monocytes Absolute: 0.4 10*3/uL (ref 0.1–1.0)
Monocytes Relative: 7 %
Neutro Abs: 3.7 10*3/uL (ref 1.7–7.7)
Neutrophils Relative %: 61 %
Platelets: 223 10*3/uL (ref 150–400)
RBC: 3.6 MIL/uL — ABNORMAL LOW (ref 3.87–5.11)
RDW: 13.5 % (ref 11.5–15.5)
WBC: 6.1 10*3/uL (ref 4.0–10.5)
nRBC: 0 % (ref 0.0–0.2)

## 2020-12-02 MED ORDER — ESOMEPRAZOLE MAGNESIUM 20 MG PO CPDR: 20.0000 mg | DELAYED_RELEASE_CAPSULE | Freq: Every day | ORAL | 0 refills | Status: DC

## 2020-12-02 NOTE — ED Triage Notes (Signed)
Pt BIB [facility] from home. Pt lives with daughter and was up using the [facility]. Per daughter pt called stating she could not get off the [facility]. Pt also stated she was having left arm pain and left hand numbness. Pt has a hx of dementia, GERD and a permanent pacemaker. Pt states her pain is 8/10. Pt is alert x2 to self and place.
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
## 2020-12-02 NOTE — Discharge Instructions (Addendum)
Talk with your primary care doctor about possible home health services see take care of your family members medical needs

## 2020-12-08 ENCOUNTER — Encounter: Payer: Self-pay | Admitting: Nurse Practitioner

## 2020-12-08 ENCOUNTER — Other Ambulatory Visit: Payer: Medicare Other

## 2020-12-08 ENCOUNTER — Ambulatory Visit (INDEPENDENT_AMBULATORY_CARE_PROVIDER_SITE_OTHER): Payer: Medicare Other | Admitting: Nurse Practitioner

## 2020-12-08 VITALS — BP 100/56 | HR 62 | Ht 67.0 in | Wt 212.1 lb

## 2020-12-08 DIAGNOSIS — R1032 Left lower quadrant pain: Secondary | ICD-10-CM

## 2020-12-08 DIAGNOSIS — K5732 Diverticulitis of large intestine without perforation or abscess without bleeding: Secondary | ICD-10-CM

## 2020-12-08 DIAGNOSIS — R197 Diarrhea, unspecified: Secondary | ICD-10-CM | POA: Diagnosis not present

## 2020-12-08 NOTE — Patient Instructions (Signed)
If you are age 85 or older, your body mass index should be between 23-30. Your Body mass index is 33.22 kg/m. If this is out of the aforementioned range listed, please consider follow up with your Primary Care Provider.  If you are age 91 or younger, your body mass index should be between 19-25. Your Body mass index is 33.22 kg/m. If this is out of the aformentioned range listed, please consider follow up with your Primary Care Provider.    LABS: Your provider has requested that you go to the basement level for lab work before leaving today. Press "B" on the elevator. The lab is located at the first door on the left as you exit the elevator.  HEALTHCARE LAWS AND MY CHART RESULTS: Due to recent changes in healthcare laws, you may see the results of your imaging and laboratory studies on MyChart before your provider has had a chance to review them.  We understand that in some cases there may be results that are confusing or concerning to you. Not all laboratory results come back in the same time frame and the provider may be waiting for multiple results in order to interpret others.  Please give [place] 48 hours in order for your provider to thoroughly review all the results before contacting the office for clarification of your results.
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
## 2020-12-10 ENCOUNTER — Other Ambulatory Visit: Payer: Medicare Other

## 2020-12-10 DIAGNOSIS — R197 Diarrhea, unspecified: Secondary | ICD-10-CM

## 2020-12-11 NOTE — Progress Notes (Signed)
Attending Physician's Attestation   I have reviewed the chart.   I agree with the Advanced Practitioner's note, impression, and recommendations with any updates as below. Agree with stool studies.  If unremarkable then endoscopic evaluation should be considered.  Due to her age increased risk for issues from a sedation perspective and procedure perspective but could still be performed if necessary.  Would likely consider repeat imaging first to see that finding her persistent or worsening within a couple weeks to help [place] decide how she is doing unless she has complete abatement of symptoms.  In the setting of potentially having a calcification in the head of the pancreas, may be worthwhile to also obtain a fecal elastase although this would not explain CT findings that could explain diarrheal symptoms.   Justice Britain, MD [facility] Advanced Endoscopy Office # [phone]

## 2020-12-14 ENCOUNTER — Ambulatory Visit: Payer: Medicare Other | Admitting: Nurse Practitioner

## 2020-12-14 LAB — GI PROFILE, STOOL, PCR

## 2020-12-14 LAB — CLOSTRIDIUM DIFFICILE TOXIN B, QUALITATIVE, REAL-TIME PCR: Toxigenic C. Difficile by PCR: NOT DETECTED

## 2020-12-17 ENCOUNTER — Other Ambulatory Visit: Payer: Self-pay

## 2020-12-17 ENCOUNTER — Telehealth: Payer: Self-pay | Admitting: Nurse Practitioner

## 2020-12-17 MED ORDER — ESOMEPRAZOLE MAGNESIUM 20 MG PO CPDR: 20.0000 mg | DELAYED_RELEASE_CAPSULE | Freq: Every day | ORAL | 1 refills | Status: DC

## 2020-12-17 NOTE — Telephone Encounter (Signed)
Jill Bowman You have a Patient Advise message from her as well.  I spoke with the daughter, Jill Bowman. Patient is better. No more pain or diarrhea. She is belching again. Nexium had helped a lot. She would like to resume this. Refilled the Nexium given at the ED which is 20 mg daily. Daughter says thank you for your kind attention to her mother.

## 2020-12-17 NOTE — Telephone Encounter (Signed)
Pt's daughter Shauna Hugh is requesting a call back from a nurse regarding her results (missed call)

## 2020-12-21 NOTE — Telephone Encounter (Signed)
Yesterday I responded to her patient advice form. Okay to continue Nexium. Daughter asked to make a follow up with me for her mother in 58 weeks or so. Also, she will call us in interim if patient has recurrent abdominal pain and / or diarrhea.

## 2020-12-22 ENCOUNTER — Telehealth: Payer: Self-pay | Admitting: Nurse Practitioner

## 2020-12-22 NOTE — Telephone Encounter (Signed)
Sent MyChart message to daughter Shauna Hugh) to please call our office at [phone] to schedule an office visit with Tye Savoy NP

## 2020-12-22 NOTE — Telephone Encounter (Signed)
Pt's daughter Shauna Hugh returned your call. She is inquiring about why her mother needs to be seen. She said to either call her or send a message via Mychart with this information.

## 2021-01-18 ENCOUNTER — Ambulatory Visit: Payer: Medicare Other | Admitting: Nurse Practitioner

## 2021-01-27 ENCOUNTER — Telehealth: Payer: Self-pay

## 2021-01-27 ENCOUNTER — Other Ambulatory Visit: Payer: Self-pay | Admitting: Orthopaedic Surgery

## 2021-01-27 ENCOUNTER — Other Ambulatory Visit: Payer: Self-pay | Admitting: Cardiology

## 2021-01-27 DIAGNOSIS — I495 Sick sinus syndrome: Secondary | ICD-10-CM

## 2021-01-27 MED ORDER — GABAPENTIN 300 MG PO CAPS: 300.0000 mg | ORAL_CAPSULE | Freq: Two times a day (BID) | ORAL | 2 refills | Status: DC

## 2021-01-27 NOTE — Telephone Encounter (Signed)
Requesting refills on file for Gabapentin 300mg 

## 2021-01-27 NOTE — Telephone Encounter (Signed)
Okay to refill amiodarone?

## 2021-02-01 ENCOUNTER — Telehealth: Payer: Self-pay | Admitting: Adult Health

## 2021-02-01 NOTE — Telephone Encounter (Signed)
No new medications,  Per daughter no other changes other her memory, she does take gabapentin bid and this helps her during day.  (I did not get impression sleeping most of day).  She does try to keep her up so as to sleep at night.  She has noted anxiety some, states needing to go to ED. (she states feels like legs/feet swelling, but per daughter states that is not true).  Daughter states that she has had some issue with other daughters when statying but they have not called about it.  I think this of course keeps her daughter up too.

## 2021-02-01 NOTE — Telephone Encounter (Signed)
Since this is only happened the last 2 nights I am hesitant to give her any additional medication.  Is there a reason that she is waking up?  Pain?  Sleeping during the day?  Started any new medication?

## 2021-02-01 NOTE — Telephone Encounter (Signed)
I called pts daughter, Suanne Marker for the last 6 wks (has 3-4 months intervals where the daughters take turns caring for there mother.  Pt has had issues for a while, and rhonda who lives in [place], [place] (1.5 hours away) now , states pt is not sleeping or resting very well.  Will go to sleep but then will wake at [place] and not go back to sleep.  Is on aricept 10mg  po qhs, and seroquel 50mg  po qhs.  She is supervised 24/7.  Feels like having something to use prn would be helpful.  Please advise.  [facility] does deliver per daughter.

## 2021-02-01 NOTE — Telephone Encounter (Signed)
Pt.'s daughter Suanne Marker is not on DPR. She states mom is with her now & for the last 2 nights, she has not been sleeping/resting. She states mom has been up 5-6 hours. She is asking for something to help mom to be able to sleep at night. Please advise.

## 2021-02-02 NOTE — Telephone Encounter (Signed)
I called Jill Bowman back and relayed that since only having issues for the last 2 days MM/NP hesitant to give sedating medication.  Pt taking aricept and seroquel and gabapentin.  She states she is not sleeping during day.  May be issue with staying at her home where she grew up.  She has anxiety/panic attacks.  She did not feel like infection, but I told her to keep track and she may not be able to relay what she is feeling.  She appreciated call back.  Will call pharmacy and check on any drug interactions aricept and gabapentin if taken together.  Will let us know if continued issues.

## 2021-02-03 ENCOUNTER — Other Ambulatory Visit: Payer: Self-pay | Admitting: Neurology

## 2021-02-03 ENCOUNTER — Other Ambulatory Visit: Payer: Self-pay

## 2021-02-03 MED ORDER — QUETIAPINE FUMARATE 50 MG PO TABS
50.0000 mg | ORAL_TABLET | Freq: Every day | ORAL | 0 refills | Status: DC
Start: 2021-02-03 — End: 2021-04-20

## 2021-02-04 MED ORDER — ESOMEPRAZOLE MAGNESIUM 20 MG PO CPDR: 20.0000 mg | DELAYED_RELEASE_CAPSULE | Freq: Every day | ORAL | 2 refills | Status: DC

## 2021-02-07 ENCOUNTER — Other Ambulatory Visit: Payer: No Typology Code available for payment source

## 2021-04-20 ENCOUNTER — Ambulatory Visit (INDEPENDENT_AMBULATORY_CARE_PROVIDER_SITE_OTHER): Payer: Medicare Other | Admitting: Nurse Practitioner

## 2021-04-20 ENCOUNTER — Other Ambulatory Visit: Payer: Self-pay

## 2021-04-20 ENCOUNTER — Encounter: Payer: Self-pay | Admitting: Nurse Practitioner

## 2021-04-20 ENCOUNTER — Encounter: Payer: Self-pay | Admitting: Adult Health

## 2021-04-20 ENCOUNTER — Ambulatory Visit (INDEPENDENT_AMBULATORY_CARE_PROVIDER_SITE_OTHER): Payer: Medicare Other | Admitting: Adult Health

## 2021-04-20 VITALS — BP 147/79 | HR 73 | Ht 67.0 in | Wt 197.0 lb

## 2021-04-20 VITALS — BP 142/80 | HR 62 | Temp 98.3°F | Ht 67.0 in | Wt 197.8 lb

## 2021-04-20 DIAGNOSIS — Z87440 Personal history of urinary (tract) infections: Secondary | ICD-10-CM | POA: Diagnosis not present

## 2021-04-20 DIAGNOSIS — R7309 Other abnormal glucose: Secondary | ICD-10-CM | POA: Diagnosis not present

## 2021-04-20 DIAGNOSIS — I10 Essential (primary) hypertension: Secondary | ICD-10-CM | POA: Diagnosis not present

## 2021-04-20 DIAGNOSIS — M25562 Pain in left knee: Secondary | ICD-10-CM

## 2021-04-20 DIAGNOSIS — E538 Deficiency of other specified B group vitamins: Secondary | ICD-10-CM

## 2021-04-20 DIAGNOSIS — E559 Vitamin D deficiency, unspecified: Secondary | ICD-10-CM

## 2021-04-20 DIAGNOSIS — M25561 Pain in right knee: Secondary | ICD-10-CM

## 2021-04-20 DIAGNOSIS — G309 Alzheimer's disease, unspecified: Secondary | ICD-10-CM | POA: Diagnosis not present

## 2021-04-20 DIAGNOSIS — G8929 Other chronic pain: Secondary | ICD-10-CM

## 2021-04-20 DIAGNOSIS — E782 Mixed hyperlipidemia: Secondary | ICD-10-CM

## 2021-04-20 DIAGNOSIS — R82998 Other abnormal findings in urine: Secondary | ICD-10-CM

## 2021-04-20 DIAGNOSIS — Z79899 Other long term (current) drug therapy: Secondary | ICD-10-CM

## 2021-04-20 DIAGNOSIS — F0281 Dementia in other diseases classified elsewhere with behavioral disturbance: Secondary | ICD-10-CM

## 2021-04-20 DIAGNOSIS — S80812A Abrasion, left lower leg, initial encounter: Secondary | ICD-10-CM

## 2021-04-20 DIAGNOSIS — R4789 Other speech disturbances: Secondary | ICD-10-CM

## 2021-04-20 DIAGNOSIS — Z6830 Body mass index (BMI) 30.0-30.9, adult: Secondary | ICD-10-CM

## 2021-04-20 LAB — POCT URINALYSIS DIPSTICK
Bilirubin, UA: NEGATIVE
Blood, UA: NEGATIVE
Glucose, UA: NEGATIVE
Ketones, UA: NEGATIVE
Nitrite, UA: NEGATIVE
Protein, UA: NEGATIVE
Spec Grav, UA: 1.025 (ref 1.010–1.025)
Urobilinogen, UA: 0.2 E.U./dL
pH, UA: 6.5 (ref 5.0–8.0)

## 2021-04-20 MED ORDER — DONEPEZIL HCL 10 MG PO TABS: 10.0000 mg | ORAL_TABLET | Freq: Every day | ORAL | 11 refills | Status: DC

## 2021-04-20 MED ORDER — LIDOCAINE 5 % EX OINT: 1.0000 "application " | TOPICAL_OINTMENT | CUTANEOUS | 3 refills | Status: DC | PRN

## 2021-04-20 MED ORDER — GABAPENTIN 300 MG PO CAPS: 300.0000 mg | ORAL_CAPSULE | Freq: Two times a day (BID) | ORAL | 1 refills | Status: DC

## 2021-04-20 MED ORDER — QUETIAPINE FUMARATE 50 MG PO TABS: 50.0000 mg | ORAL_TABLET | Freq: Every day | ORAL | 3 refills | Status: DC
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
## 2021-04-20 NOTE — Patient Instructions (Signed)
Your Plan:  Continue Aricept and Seroquel If your symptoms worsen or you develop new symptoms please let us know.       Thank you for coming to see [place] at [facility]. I hope we have been able to provide you high quality care today.  You may receive a patient satisfaction survey over the next few weeks. We would appreciate your feedback and comments so that we may continue to improve ourselves and the health of our patients.

## 2021-04-21 ENCOUNTER — Telehealth: Payer: Self-pay | Admitting: *Deleted

## 2021-04-21 LAB — CMP14+EGFR
ALT: 11 IU/L (ref 0–32)
AST: 19 IU/L (ref 0–40)
Albumin/Globulin Ratio: 1.6 (ref 1.2–2.2)
Albumin: 4.2 g/dL (ref 3.6–4.6)
Alkaline Phosphatase: 130 IU/L — ABNORMAL HIGH (ref 44–121)
BUN/Creatinine Ratio: 9 — ABNORMAL LOW (ref 12–28)
BUN: 11 mg/dL (ref 8–27)
Bilirubin Total: 0.3 mg/dL (ref 0.0–1.2)
CO2: 24 mmol/L (ref 20–29)
Calcium: 9.4 mg/dL (ref 8.7–10.3)
Chloride: 104 mmol/L (ref 96–106)
Creatinine, Ser: 1.2 mg/dL — ABNORMAL HIGH (ref 0.57–1.00)
Globulin, Total: 2.7 g/dL (ref 1.5–4.5)
Glucose: 85 mg/dL (ref 65–99)
Potassium: 4.2 mmol/L (ref 3.5–5.2)
Sodium: 142 mmol/L (ref 134–144)
Total Protein: 6.9 g/dL (ref 6.0–8.5)
eGFR: 43 mL/min/{1.73_m2} — ABNORMAL LOW (ref >59)

## 2021-04-21 LAB — LIPID PANEL
Chol/HDL Ratio: 2.7 ratio (ref 0.0–4.4)
Cholesterol, Total: 131 mg/dL (ref 100–199)
HDL: 49 mg/dL (ref >39)
LDL Chol Calc (NIH): 60 mg/dL (ref 0–99)
Triglycerides: 123 mg/dL (ref 0–149)
VLDL Cholesterol Cal: 22 mg/dL (ref 5–40)

## 2021-04-21 LAB — CBC
Hematocrit: 37.6 % (ref 34.0–46.6)
Hemoglobin: 12.4 g/dL (ref 11.1–15.9)
MCH: 31.4 pg (ref 26.6–33.0)
MCHC: 33 g/dL (ref 31.5–35.7)
MCV: 95 fL (ref 79–97)
Platelets: 235 10*3/uL (ref 150–450)
RBC: 3.95 x10E6/uL (ref 3.77–5.28)
RDW: 13.8 % (ref 11.7–15.4)
WBC: 6.9 10*3/uL (ref 3.4–10.8)

## 2021-04-21 LAB — VITAMIN D 25 HYDROXY (VIT D DEFICIENCY, FRACTURES): Vit D, 25-Hydroxy: 54.3 ng/mL (ref 30.0–100.0)

## 2021-04-21 LAB — VITAMIN B12: Vitamin B-12: 673 pg/mL (ref 232–1245)

## 2021-04-21 LAB — HEMOGLOBIN A1C
Est. average glucose Bld gHb Est-mCnc: 111 mg/dL
Hgb A1c MFr Bld: 5.5 % (ref 4.8–5.6)

## 2021-04-21 NOTE — Telephone Encounter (Signed)
I called diane, daughter of pt.  I relayed the message per MM/NP regarding the belching.  She verbalized understanding. I mailed dementia packet to her at address listed per her request.  I also placed the message below and [facility] per daughters request.

## 2021-04-21 NOTE — Telephone Encounter (Signed)
-----   Message from Ward Givens, NP sent at 04/20/2021  3:53 PM EDT ----- Jill Bowman: Please advise patient's family that I reviewed her chart.v \\ocalizations  that the family describes as belching could be a part of the Alzheimer's process as an involuntary compulsive behavior.  Not sure that medication is needed if this is not bothering the patient--adding more medication may present risk of side effects.

## 2021-04-22 ENCOUNTER — Telehealth: Payer: Self-pay | Admitting: *Deleted

## 2021-04-22 LAB — URINE CULTURE: Organism ID, Bacteria: NO GROWTH

## 2021-04-22 NOTE — Chronic Care Management (AMB) (Signed)
  Chronic Care Management   Note  04/22/2021 Name: Jill Bowman MRN: [id number] DOB: 1929/04/15  [name] is a 87 y.o. year old female who is a primary care patient of [name], [place]. I reached out to [facility] by phone today in response to a referral sent by [name]'s PCP, [name], FNP.     [name] was given information about Chronic Care Management services today including:  1. CCM service includes personalized support from designated clinical staff supervised by her physician, including individualized plan of care and coordination with other care providers 2. 24/7 contact phone numbers for assistance for urgent and routine care needs. 3. Service will only be billed when office clinical staff spend 20 minutes or more in a month to coordinate care. 4. Only one practitioner may furnish and bill the service in a calendar month. 5. The patient may stop CCM services at any time (effective at the end of the month) by phone call to the office staff. 6. The patient will be responsible for cost sharing (co-pay) of up to 20% of the service fee (after annual deductible is met).  Daughter [place],Dianne DPR on file verbally agreed to assistance and services provided by embedded care coordination/care management team today.  Follow up plan: Telephone appointment with care management team member scheduled for:05/09/2021  [place] Management

## 2021-04-26 ENCOUNTER — Other Ambulatory Visit: Payer: Self-pay | Admitting: Cardiology

## 2021-04-26 DIAGNOSIS — I4892 Unspecified atrial flutter: Secondary | ICD-10-CM

## 2021-04-27 ENCOUNTER — Ambulatory Visit: Payer: Medicare Other | Admitting: Orthopaedic Surgery

## 2021-04-28 ENCOUNTER — Telehealth: Payer: Self-pay | Admitting: *Deleted

## 2021-04-28 NOTE — Chronic Care Management (AMB) (Signed)
  Care Management   Note  04/28/2021 Name: Rogene Meth MRN: [id number] DOB: [birth year]  Jerlisa Diliberto is a 87 y.o. year old female who is a primary care patient of [name], [place] and is actively engaged with the care management team. I reached out to [facility] by phone today to assist with scheduling an initial visit with the Pharmacist  Follow up plan: Face to Face appointment with care management team member scheduled for: 05/05/2021  [place] Management

## 2021-05-03 ENCOUNTER — Telehealth: Payer: Self-pay

## 2021-05-03 NOTE — Chronic Care Management (AMB) (Signed)
Chronic Care Management Pharmacy Assistant   Name: Jill Bowman  MRN: [id number] DOB: 15-Jan-1929  Reason for Encounter: Chart Review for CPP visit on 05/05/2021.   Conditions to be addressed/monitored: CAD, HLD, DMII, CKD Stage III, Depression, Dementia and Tachycardia-bradycardia Syndrome, Alzheimers  Recent office visits:  04/20/2021 Jill Brine, FNP (Primary Provider)- According to the note patient seen for a f/u on her cholesterol and abnormal glucose. AMB Referral to [facility] Coordination, Continue with current medications, tolerating well. CMP ordered to check renal function. The importance of regular exercise and dietary modification was stressed to the patient. Continue follow up with Dr. Einar Bowman, Also encouraged to spend 15 minutes in the sun daily. Sent Rx for lidocaine and she can mix with voltaren gel to apply to knees as needed; Apply 1 application topically as needed. Follow-up in 4 months.  Recent consult visits:  04/20/2021 Jill Givens, NP (Neurology)- F/U appointment for Alzheimers, She is currently on Aricept 10 mg at bedtime and remains on Seroquel 50 mg at bedtime. Advised that these vocalizations that the family describes as belching could be a part of the Alzheimer's process as an involuntary compulsive behavior.  Not sure that medication is needed if this is not bothering the patient. adding more medication may present risk of side effects. Follow-up in 6 months or sooner if needed  01/27/2021 Jill Rosenthal, MD (Orthopedic) Orders only Note- Gabapentin 300 mg discontinued   12/17/2020 Jill Evener, LPN Jill Bowman) Orders Note Only-  Discontinued Esomeprazole (Kaser) 20 MG capsule   12/08/2020 Jill Savoy, NP [facility]) [facility] Follow-up for LLQ Pain and Diarrhea since December.  CT scan in ED suggests mild sigmoid diverticulitis. Per note if stools studies negative and symptoms persist then we can talk about a colonoscopy, If stool negative for  C-diff she can try imodium while we complete workup. Excessive belching, fairly new problem. Multiple belches in clinic. Already on chronic PPI for history of GERD. Mylanta doesn't help GERD / moderate sized paraesophageal hernia on CT scan. She was recently seen in ED for chest pain and excessive belching on chronic PPI. Symptoms may or may not be related to the paraesophageal hernia. I will ask Dr. Rush Bowman to review the images.    Hospital visits:  Medication Reconciliation was completed by comparing discharge summary, patient's EMR and Pharmacy list, and upon discussion with patient.  Admitted to the hospital on 12/02/2020 due to Acute Pain of L Shoulder, and arm pain. Discharge date was 12/02/2020. Discharged from [name] Emergency Room Department.    New?Medications Started at [facility] Discharge:?? -started Nexium 20 mg  due to chronic burping issues per note patient's diagnosis is L arm pain.  Medication Changes at Hospital Discharge: None  Medications Discontinued at Hospital Discharge: -Stopped: None   Medications that remain the same after Hospital Discharge:??  -All other medications will remain the same.    Medication Reconciliation was completed by comparing discharge summary, patient's EMR and Pharmacy list, and upon discussion with patient.  Admitted to the hospital on 11/24/2020 due to Chest Pain, Pacemaker, and Diverticulitis. Discharge date was 11/24/2020. Discharged from [facility] Emergency Room Department.    New?Medications Started at [facility] Discharge:?? -started Ondansetron 4 mg PRN, Avelox 400 mg daily for 10 days due to Chest Pain, Pacemaker, and Diverticulitis  Medication Changes at Hospital Discharge: None  Medications Discontinued at Hospital Discharge: -Stopped: None   Medications that remain the same after Hospital Discharge:??  -All other medications will remain  the same.    Any side effects from any  medications? No Do you have an symptoms or problems not managed by your medications? Yes Involuntary vocalization daughter sent e-mail to Neuro as pt. Is complaining that she is having headaches, and her chest is hurting. It only stops when pt. Is seeing.   Any concerns about your health right now? Yes only the Involuntary vocalization   Has your provider asked that you check blood pressure, blood sugar, or follow special diet at home? Yes she checks it at home BP mostly elevated during provider visits, No special diets she does drink Ensure, no fired foods  Do you get any type of exercise on a regular basis? Patients is able to walk around the house and up and down her deck. She is very wobbly when walking so she doesn't walk much.  Can you think of a goal you would like to reach for your health? To live until she is 89 but the patient is not on board. The daughter just wants to keep the patient happy and make sure her pain is controlled.  Do you have any problems getting your medications? No she has patients medications delivered and patient is financially stable  Is there anything that you would like to discuss during the appointment? Not anything at this moment  Please bring medications and supplements to appointment  Medications: Outpatient Encounter Medications as of 05/03/2021  Medication Sig Note  . acetaminophen (TYLENOL) 500 MG tablet Take 500 mg by mouth every 6 (six) hours as needed for mild pain.   Marland Kitchen acidophilus (RISAQUAD) CAPS capsule Take 1 capsule by mouth daily.   Marland Kitchen amiodarone (PACERONE) 100 MG tablet TAKE ONE TABLET BY MOUTH EVERY DAY ONCE DAILY IN THE MORNING]   . amLODipine (NORVASC) 5 MG tablet TAKE ONE TABLET BY MOUTH EVERY DAY (Patient taking differently: Take 5 mg by mouth daily.)   . carboxymethylcellulose (REFRESH PLUS) 0.5 % SOLN Place 1 drop into both eyes 3 (three) times daily as needed (dry eyes).   Marland Kitchen diclofenac sodium (VOLTAREN) 1 % GEL Apply 2 g topically 4  (four) times daily.   Marland Kitchen docusate sodium (COLACE) 100 MG capsule Take 200 mg by mouth at bedtime.   . donepezil (ARICEPT) 10 MG tablet Take 1 tablet (10 mg total) by mouth at bedtime.   Marland Kitchen esomeprazole (NEXIUM) 20 MG capsule Take 1 capsule (20 mg total) by mouth daily.   Marland Kitchen gabapentin (NEURONTIN) 300 MG capsule Take 1 capsule (300 mg total) by mouth 2 (two) times daily.   Marland Kitchen lidocaine (XYLOCAINE) 5 % ointment Apply 1 application topically as needed.   . loratadine (CLARITIN) 10 MG tablet Take 10 mg by mouth daily as needed for allergies.    . metoprolol succinate (TOPROL-XL) 25 MG 24 hr tablet Take 1 tablet (25 mg total) by mouth daily. Take with or immediately following a meal.   . nitroGLYCERIN (NITROSTAT) 0.4 MG SL tablet dissolve ONE UNDER THE TONGUE EVERY FIVE minutes FOR not more THAN THREE doses (Patient taking differently: Place 0.4 mg under the tongue every 5 (five) minutes as needed for chest pain.)   . ondansetron (ZOFRAN) 4 MG tablet Take 1 tablet (4 mg total) by mouth every 8 (eight) hours as needed for up to 20 doses for nausea or vomiting. 12/02/2020: Picked up but never used  . pravastatin (PRAVACHOL) 80 MG tablet Take 1 tablet (80 mg total) by mouth daily.   . QUEtiapine (SEROQUEL) 50 MG tablet  Take 1 tablet (50 mg total) by mouth at bedtime.   Alveda Reasons 20 MG TABS tablet TAKE 1 TABLET BY MOUTH ONCE DAILY WITH SUPPER    No facility-administered encounter medications on file as of 05/03/2021.   Fill History: Pacerone 100 mg- Last filled 02/16/2021 for 90 day supply Donepezil 10 mg- Last filled 04/22/2021 for 30 day supply Esomeprazole Magnesium 20 mg- Last filled 03/29/2021 for 30 day supply Gabapentin 300 mg- Last filled 04/30/2021 for 90 day supply Metoprolol ER 25 mg- Last filled 04/28/2021 for 90 day supply Pravastatin 80 mg last filled 03/03/2021 for a 90-Day Supply Quetiapine 50 mg- Last filled 04/20/2021 for 90 day supply Xarelto 20 mg- Last filled 04/27/2021 for 90 day supply   Amlodipine 5 mg- Last filled 04/28/2021 for 90 day supply   Star Rating Drugs: Pravastatin 80 mg last filled 03/03/2021 for a [place], [place] Pharmacist Assistant [phone]

## 2021-05-05 ENCOUNTER — Telehealth: Payer: Self-pay

## 2021-05-05 ENCOUNTER — Ambulatory Visit (INDEPENDENT_AMBULATORY_CARE_PROVIDER_SITE_OTHER): Payer: Medicare Other

## 2021-05-05 DIAGNOSIS — G301 Alzheimer's disease with late onset: Secondary | ICD-10-CM | POA: Diagnosis not present

## 2021-05-05 DIAGNOSIS — E782 Mixed hyperlipidemia: Secondary | ICD-10-CM | POA: Diagnosis not present

## 2021-05-05 DIAGNOSIS — I2510 Atherosclerotic heart disease of native coronary artery without angina pectoris: Secondary | ICD-10-CM

## 2021-05-05 DIAGNOSIS — I10 Essential (primary) hypertension: Secondary | ICD-10-CM | POA: Diagnosis not present

## 2021-05-05 DIAGNOSIS — K219 Gastro-esophageal reflux disease without esophagitis: Secondary | ICD-10-CM

## 2021-05-05 DIAGNOSIS — F0280 Dementia in other diseases classified elsewhere without behavioral disturbance: Secondary | ICD-10-CM

## 2021-05-05 DIAGNOSIS — Z8739 Personal history of other diseases of the musculoskeletal system and connective tissue: Secondary | ICD-10-CM

## 2021-05-05 NOTE — Progress Notes (Signed)
Chronic Care Management Pharmacy Note  05/19/2021 Name:  Jill Bowman MRN:  [id number] DOB:  [birth year]  Summary: Patient has been belching frequently and may be started on metoclopramide.   Recommendations/Changes made from today's visit: Recommend patient get shingrix vaccine  Recommend patient use some sort of pill packaging system to keep up with medication   Plan: Patient to be onboarded to [facility] for pill packaging to improve adherence and quality of life.  Review drug interactions of metoclopramide with current medications     Subjective: Jill Bowman is an 87 y.o. year old female who is a primary patient of [name], [place].  The CCM team was consulted for assistance with disease management and care coordination needs.    Engaged with patient by telephone for initial visit in response to provider referral for pharmacy case management and/or care coordination services.  Patient presents with her Ms. [name] for her initial visit.   Consent to Services:  The patient was given the following information about Chronic Care Management services today, agreed to services, and gave verbal consent: 1. CCM service includes personalized support from designated clinical staff supervised by the primary care provider, including individualized plan of care and coordination with other care providers 2. 24/7 contact phone numbers for assistance for urgent and routine care needs. 3. Service will only be billed when office clinical staff spend 20 minutes or more in a month to coordinate care. 4. Only one practitioner may furnish and bill the service in a calendar month. 5.The patient may stop CCM services at any time (effective at the end of the month) by phone call to the office staff. 6. The patient will be responsible for cost sharing (co-pay) of up to 20% of the service fee (after annual deductible is met). Patient agreed to services and consent obtained.  Patient  Care Team: [name], [place] as PCP - General (General Practice) Rex Kras, Claudette Stapler, RN as [place] as [place], Sharyn Blitz, [facility] (Pharmacist)  Recent office visits:  04/20/2021 [name], FNP (Primary Provider)- According to the note patient seen for a f/u on her cholesterol and abnormal glucose. AMB Referral to [facility] Coordination, Continue with current medications, tolerating well. CMP ordered to check renal function. The importance of regular exercise and dietary modification was stressed to the patient. Continue follow up with Dr. Einar Gip, Also encouraged to spend 15 minutes in the sun daily.  Sent Rx for lidocaine and she can mix with voltaren gel to apply to knees as needed; Apply 1 application topically as needed. Follow-up in 4 months.   Recent consult visits:  04/20/2021 Ward Givens, NP (Neurology)- F/U appointment for Alzheimers, She is currently on Aricept 10 mg at bedtime and remains on Seroquel 50 mg at bedtime. Advised that these vocalizations that the family describes as belching could be a part of the Alzheimer's process as an involuntary compulsive behavior.  Not sure that medication is needed if this is not bothering the patient. adding more medication may present risk of side effects. Follow-up in 6 months or sooner if needed   01/27/2021 Jean Rosenthal, MD (Orthopedic) Orders only Note- Gabapentin 300 mg discontinued    12/17/2020 Virgina Evener, LPN [facility]) Orders Note Only-  Discontinued Esomeprazole ([place]) 20 MG capsule    12/08/2020 Tye Savoy, NP [facility]) [facility] Follow-up for LLQ Pain and Diarrhea since December.  CT scan in ED suggests mild sigmoid diverticulitis. Per note  if stools studies negative and symptoms persist then we can talk about a colonoscopy, If stool negative for C-diff she can try imodium while we complete workup. Excessive belching, fairly new  problem. Multiple belches in clinic. Already on chronic PPI for history of GERD. Mylanta doesn't help GERD / moderate sized paraesophageal hernia on CT scan. She was recently seen in ED for chest pain and excessive belching on chronic PPI. Symptoms may or may not be related to the paraesophageal hernia. I will ask Dr. Rush Landmark to review the images.      Hospital visits:  Medication Reconciliation was completed by comparing discharge summary, patient's EMR and Pharmacy list, and upon discussion with patient.   Admitted to the hospital on 12/02/2020 due to Acute Pain of L Shoulder, and arm pain. Discharge date was 12/02/2020. Discharged from [facility] Emergency Room Department.     Objective:  Lab Results  Component Value Date   CREATININE 1.20 (H) 04/20/2021   BUN 11 04/20/2021   GFR 47.30 (L) 06/19/2011   GFRNONAA 46 (L) 12/02/2020   GFRAA 40 (L) 10/07/2020   NA 142 04/20/2021   K 4.2 04/20/2021   CALCIUM 9.4 04/20/2021   CO2 24 04/20/2021   GLUCOSE 85 04/20/2021    Lab Results  Component Value Date/Time   HGBA1C 5.5 04/20/2021 09:22 AM   HGBA1C 5.7 (H) 10/07/2020 04:06 PM   GFR 47 06/19/2011 11:40 AM   MICROALBUR 30 10/07/2020 04:07 PM   MICROALBUR 30 11/07/2018 12:28 PM    Last diabetic Eye exam: No results found for: HMDIABEYEEXA  Last diabetic Foot exam: No results found for: HMDIABFOOTEX   Lab Results  Component Value Date   CHOL 131 04/20/2021   HDL 49 04/20/2021   LDLCALC 60 04/20/2021   TRIG 123 04/20/2021   CHOLHDL 2.7 04/20/2021    Hepatic Function Latest Ref Rng & Units 04/20/2021 11/24/2020 10/07/2020  Total Protein 6.0 - 8.5 g/dL 6.9 6.7 7.0  Albumin 3.6 - 4.6 g/dL 4.2 3.2(L) 4.0  AST 0 - 40 IU/L _0 ALT 0 - 32 IU/L _1 Alk Phosphatase 44 - 121 IU/L 130(H) 93 123(H)  Total Bilirubin 0.0 - 1.2 mg/dL 0.3 0.4 0.3  Bilirubin, Direct 0.0 - 0.2 mg/dL - <0.1 -    Lab Results  Component Value Date/Time   TSH 2.990  02/26/2020 09:43 AM   TSH 2.110 10/02/2019 12:09 PM   FREET4 1.16 02/26/2020 09:43 AM    CBC Latest Ref Rng & Units 04/20/2021 12/02/2020 11/24/2020  WBC 3.4 - 10.8 x10E3/uL 6.9 6.1 6.9  Hemoglobin 11.1 - 15.9 g/dL 12.4 12.0 12.0  Hematocrit 34.0 - 46.6 % 37.6 36.3 36.7  Platelets 150 - 450 x10E3/uL 235 223 226    Lab Results  Component Value Date/Time   VD25OH 54.3 04/20/2021 09:22 AM   VD25OH 54.7 10/07/2020 04:06 PM    Clinical ASCVD: Yes  The ASCVD Risk score Mikey Bussing DC Jr., et al., 2013) failed to calculate for the following reasons:   The 2013 ASCVD risk score is only valid for ages 42 to 54    Depression screen PHQ 2/9 10/07/2020 10/07/2020 10/02/2019  Decreased Interest 0 0 0  Down, Depressed, Hopeless 0 0 0  PHQ - 2 Score 0 0 0  Altered sleeping - - 0  Tired, decreased energy - - 0  Change in appetite - - 0  Feeling bad or failure about yourself  - - 0  Trouble  concentrating - - 0  Moving slowly or fidgety/restless - - 0  Suicidal thoughts - - 0  PHQ-9 Score - - 0  Difficult doing work/chores - - Not difficult at all     Social History   Tobacco Use  Smoking Status Former   Packs/day: 0.50   Years: 5.00   Pack years: 2.50   Types: Cigarettes   Quit date: 11/27/1950   Years since quitting: 70.5  Smokeless Tobacco Never   BP Readings from Last 3 Encounters:  04/20/21 (!) 147/79  04/20/21 (!) 142/80  12/08/20 (!) 100/56   Pulse Readings from Last 3 Encounters:  04/20/21 73  04/20/21 62  12/08/20 62   Wt Readings from Last 3 Encounters:  04/20/21 197 lb (89.4 kg)  04/20/21 197 lb 12.8 oz (89.7 kg)  12/08/20 212 lb 2 oz (96.2 kg)   BMI Readings from Last 3 Encounters:  04/20/21 30.85 kg/m  04/20/21 30.98 kg/m  12/08/20 33.22 kg/m    Assessment/Interventions: Review of patient past medical history, allergies, medications, health status, including review of consultants reports, laboratory and other test data, was performed as part of comprehensive  evaluation and provision of chronic care management services.   SDOH:  (Social Determinants of Health) assessments and interventions performed: Yes  SDOH Screenings   Alcohol Screen: Not on file  Depression (PHQ2-9): Low Risk    PHQ-2 Score: 0  Financial Resource Strain: Low Risk    Difficulty of Paying Living Expenses: Not hard at all  Food Insecurity: No Food Insecurity   Worried About Charity fundraiser in the Last Year: Never true   Ran Out of Food in the Last Year: Never true  Housing: Low Risk    Last Housing Risk Score: 0  Physical Activity: Inactive   Days of Exercise per Week: 0 days   Minutes of Exercise per Session: 0 min  Social Connections: Not on file  Stress: No Stress Concern Present   Feeling of Stress : Not at all  Tobacco Use: Medium Risk   Smoking Tobacco Use: Former   Smokeless Tobacco Use: Never  Transportation Needs: No Data processing manager (Medical): No   Lack of Transportation (Non-Medical): No    CCM Care Plan  Allergies  Allergen Reactions   Codeine Itching, Rash and Other (See Comments)    Full body rash    Hydroxyzine Other (See Comments)    Extreme confusion and hallucinations   Lorazepam Other (See Comments)    Extreme confusion, hallucinations and hyperactivity   Penicillins Anaphylaxis, Hives and Shortness Of Breath    Tolerated cefazolin and ceftriaxone in 2013   Sulfa Antibiotics Shortness Of Breath   Zinc Itching   Ciprofloxacin Other (See Comments)    unknown   Ciprofloxacin Hives and Other (See Comments)    "think I break out in welts"    Latex Rash and Other (See Comments)    Tears skin    Penicillins Other (See Comments)    Unknown    Sulfa Antibiotics Other (See Comments)    unknown    Medications Reviewed Today     Reviewed by Daneen Schick (Social Worker) on 05/10/21 at 1244  Med List Status: <None>   Medication Order Taking? Sig Documenting Provider Last Dose Status Informant   acetaminophen (TYLENOL) 500 MG tablet [id number] No Take 500 mg by mouth every 6 (six) hours as needed for mild pain. [provider] Taking Active Child  acidophilus (RISAQUAD) CAPS capsule  [id number] No Take 1 capsule by mouth daily. Nita Sells, MD Taking Active Child  amiodarone (PACERONE) 100 MG tablet [id number] No TAKE ONE TABLET BY MOUTH EVERY DAY ONCE DAILY IN THE MORNING] Adrian Prows, MD Taking Active   amLODipine (NORVASC) 5 MG tablet [id number] No TAKE ONE TABLET BY MOUTH EVERY DAY  Patient taking differently: Take 5 mg by mouth daily.   Adrian Prows, MD Taking Active   carboxymethylcellulose (REFRESH PLUS) 0.5 % SOLN [id number] No Place 1 drop into both eyes 3 (three) times daily as needed (dry eyes). [provider] Taking Active Child  diclofenac sodium (VOLTAREN) 1 % GEL [id number] No Apply 2 g topically 4 (four) times daily. Mcarthur Rossetti, MD Taking Active Child  docusate sodium (COLACE) 100 MG capsule [id number] No Take 200 mg by mouth at bedtime. [provider] Taking Active Child  donepezil (ARICEPT) 10 MG tablet [id number]  Take 1 tablet (10 mg total) by mouth at bedtime. Ward Givens, NP  Active   esomeprazole (NEXIUM) 20 MG capsule [id number] No Take 1 capsule (20 mg total) by mouth daily. Willia Craze, NP Taking Active   gabapentin (NEURONTIN) 300 MG capsule [id number] No Take 1 capsule (300 mg total) by mouth 2 (two) times daily. [name], FNP Taking Active   lidocaine (XYLOCAINE) 5 % ointment [id number] No Apply 1 application topically as needed. [name], FNP Taking Active   loratadine (CLARITIN) 10 MG tablet [id number] No Take 10 mg by mouth daily as needed for allergies.  [provider] Taking Active Child  metoCLOPramide (REGLAN) 5 MG tablet [id number]  Use before lunch and after dinner - 15 days, bid po. Dohmeier, Asencion Partridge, MD  Active   metoprolol succinate (TOPROL-XL) 25 MG 24 hr tablet [id number] No Take 1 tablet  (25 mg total) by mouth daily. Take with or immediately following a meal. Adrian Prows, MD Taking Active   nitroGLYCERIN (NITROSTAT) 0.4 MG SL tablet [id number] No dissolve ONE UNDER THE TONGUE EVERY FIVE minutes FOR not more THAN THREE doses  Patient taking differently: Place 0.4 mg under the tongue every 5 (five) minutes as needed for chest pain.   Adrian Prows, MD Taking Active   ondansetron [facility]) 4 MG tablet [id number] No Take 1 tablet (4 mg total) by mouth every 8 (eight) hours as needed for up to 20 doses for nausea or vomiting. Lennice Sites, DO Taking Active Child           Med Note Leanord Asal, TREVINA M   Thu Dec 02, 2020 12:32 PM) Picked up but never used  pravastatin (PRAVACHOL) 80 MG tablet [id number] No Take 1 tablet (80 mg total) by mouth daily. Adrian Prows, MD Taking Active Child  QUEtiapine (SEROQUEL) 50 MG tablet [id number]  Take 1 tablet (50 mg total) by mouth at bedtime. Ward Givens, NP  Active   XARELTO 20 MG TABS tablet [id number]  TAKE 1 TABLET BY MOUTH ONCE DAILY WITH SUPPER Adrian Prows, MD  Active             Patient Active Problem List   Diagnosis Date Noted   Abnormal glucose 05/25/2020   Left knee pain 05/06/2020   Type 2 diabetes mellitus ([place]) 04/26/2020   CKD (chronic kidney disease), stage III ([place]) 04/26/2020   SBO (small bowel obstruction) ([place]) 04/26/2020   Transient ischemic attack (TIA) 04/12/2020   Progressive dementia with uncertain etiology ([place]) 04/12/2020   Dementia due to medical condition with behavioral disturbance ([place]) 04/12/2020   Paranoid reaction (  [place]) 04/12/2020   Encounter for care of pacemaker 09/12/2019   Stented coronary artery 11/07/2018   Pseudoseizure    Late onset Alzheimer's disease without behavioral disturbance ([place])    GERD (gastroesophageal reflux disease) 01/17/2018   Depression 01/17/2018   Atrial tachycardia ([place]) 01/17/2018   HLD (hyperlipidemia) 01/17/2018   Breast cancer of lower-outer quadrant of right female breast ([place])  01/03/2016   Open wound of abdominal wall without complication 20/25/4270   Cellulitis of abdominal wall 03/24/2015   Elevated alkaline phosphatase level 12/28/2014   Abdominal wall abscess 10/19/2014   Bradycardia 10/19/2014   Physical deconditioning 09/30/2012   Soft tissue infection, L leg 09/11/2012   CAD (coronary artery disease) 07/16/2012   Pacemaker  Dual chamber Medtronic Adapta L ADDRL1  07/16/2012   Morbid obesity ([place]) 07/16/2012   Degloving injury of lower leg, Left 07/04/2012   Tachycardia-bradycardia syndrome ([place])     Immunization History  Administered Date(s) Administered   Fluad Quad(high Dose 65+) 09/07/2020   Influenza, High Dose Seasonal PF 08/04/2015, 08/05/2019   Influenza-Unspecified 07/28/2018   Moderna Sars-Covid-2 Vaccination 02/21/2020, 03/20/2020, 09/23/2020, 03/31/2021   Tdap 08/07/2018    Conditions to be addressed/monitored:  Hypertension and Hyperlipidemia  Care Plan : [place]  Updates made by Mayford Knife, [place] since 05/19/2021 12:00 AM     Problem: HTN, HLD, CKD, GERD, DEPRESSION, [place], CAD      Long-Range Goal: Disease Management   This Visit's Progress: On track  Note:    Current Barriers:  Unable to independently monitor therapeutic efficacy  Pharmacist Clinical Goal(s):  Patient will achieve adherence to monitoring guidelines and medication adherence to achieve therapeutic efficacy through collaboration with PharmD and provider.   Interventions: 1:1 collaboration with [name], FNP regarding development and update of comprehensive plan of care as evidenced by provider attestation and co-signature Inter-disciplinary care team collaboration (see longitudinal plan of care) Comprehensive medication review performed; medication list updated in electronic medical record  Hypertension (BP goal <140/90) -Controlled -Current treatment: Amlodipine 5 mg taking 1 tablet daily  Metoprolol Succinate 25 mg tablet  once per day  -Current home readings: patient current home readings are doing well  -Current dietary habits: eating healthy vegetables and fresh fruits -Current exercise habits: patient does some walking around the house, and she also does some dancing.  -Denies hypotensive/hypertensive symptoms -Educated on Daily salt intake goal < 2300 mg; Importance of home blood pressure monitoring; Proper BP monitoring technique; -Counseled to monitor BP at home at least four times per week, document, and provide log at future appointments -Recommended to continue current medication  Coronary Artery Disease/TIA: (LDL goal < 70) -Controlled -Current treatment: Pravastatin 80 mg tablet once per day Nitroglycerin 0.4 mg SL - dissolve one under the tongue every five minutes for no more that three doses. Amlodipine 5 mg tablet once per day  Metoprolol Succinate 25 mg - take 1 tablet by mouth daily.  Amiodarone 100 mg - take 1 tablet by mouth once daily in the morning Xarelto 20 mg tablet once daily with supper -Current dietary patterns: Eating mostly vegetables and fruits.  -Current exercise habits:please see hypertension  -Ms. Ferriss's daughter keeps all of her medications organized based on the time that she takes them. She would prefer a pill packaging system that would make it easier for the patient take all of her medications at one time.  -Educated on Cholesterol goals;  Importance of limiting foods high in cholesterol; -Recommended to continue current medication  Late [place] Alzheimers  -Not ideally controlled -Current treatment  Gabapentin 300 mg capsule - take 1 capsule by mouth two times daily Quetiapine 50 mg tablet at bedtime Donepezil 10 mg tablet at bedtime  Metoclopramide 5 mg tablet  Patient has not started taking this medication -Patient daughter reports that her mother is doing well with her memory.  -She is concerned about her frequent belching because her mother reports it  is painful.  -She communicated this information to the Neurology team and was given the option of starting Metoclopramide 5 mg tablet to help with the belching.  -Ms. Porteous requested that I review her mothers current medications to determine if there are any interactions.  -Reviewed patients current medication list and determined that there is an interaction.   -Quetiapine and Metoclopramide are not recommended in combination due to adverse/toxic effects of antispychotic agents. Coadministration should be avoided due to the development of extrapyramidal reactions or neuroleptic malignant syndrome.  -Recommended to continue current medication Collaborated with patients daughter and explained the side effect. She reported that she was not started on Reglan when the presciption was sent in last week because of concern about possible side effects.  Health Maintenance -Vaccine gaps: Zoster Vaccines (Shingrix), PNA   -Recommended patient be vaccinated for Shingles and PNA.   Patient Goals/Self-Care Activities Patient will:  - take medications as prescribed  Follow Up Plan: The patient has been provided with contact information for the care management team and has been advised to call with any health related questions or concerns.         Medication Assistance: None required.  Patient affirms current coverage meets needs.  Compliance/Adherence/Medication fill history: Care Gaps: Opthamology Exam Zoster Vaccine PNA Vaccine  Star-Rating Drugs: Pravastatin 40 mg   Patient's preferred pharmacy is:  [facility] [place] [address] [place] [address] Phone: [phone] Fax: [phone]  [facility] [address] [place], [address] [place] [address] Phone: [phone] Fax: [phone]  Uses pill box? No - medication is organized in individual ziploc bags based on time that  patient takes the medication  Pt endorses 80% compliance  We discussed: Verbal consent obtained for [facility] enhanced pharmacy services (medication synchronization, adherence packaging, delivery coordination). A medication sync plan was created to allow patient to get all medications delivered once every 30 to 90 days per patient preference. Patient understands they have freedom to choose pharmacy and clinical pharmacist will coordinate care between all prescribers and [facility]. Patient decided to: Utilize [facility] for medication synchronization, packaging and delivery  Care Plan and Follow Up Patient Decision:  Patient agrees to Care Plan and Follow-up.  Plan: The patient has been provided with contact information for the care management team and has been advised to call with any health related questions or concerns.   Orlando Penner, PharmD Clinical Pharmacist [facility] [phone]

## 2021-05-05 NOTE — Chronic Care Management (AMB) (Signed)
Chronic Care Management Pharmacy Assistant   Name: Jill Bowman  MRN: [id number] DOB: 29-Jan-1929   Reason for Encounter: Medication Review/ Medication Coordination for Enhanced Pharmacy Services    Recent office visits:  04/20/2021 [name], FNP (Primary Provider)- According to the note patient seen for a f/u on her cholesterol and abnormal glucose. AMB Referral to [facility] Coordination, Continue with current medications, tolerating well. CMP ordered to check renal function. The importance of regular exercise and dietary modification was stressed to the patient. Continue follow up with Dr. Einar Gip, Also encouraged to spend 15 minutes in the sun daily.  Sent Rx for lidocaine and she can mix with voltaren gel to apply to knees as needed; Apply 1 application topically as needed. Follow-up in 4 months.    Recent consult visits:  04/20/2021 Ward Givens, NP (Neurology)- F/U appointment for Alzheimers, She is currently on Aricept 10 mg at bedtime and remains on Seroquel 50 mg at bedtime. Advised that these vocalizations that the family describes as belching could be a part of the Alzheimer's process as an involuntary compulsive behavior.  Not sure that medication is needed if this is not bothering the patient. adding more medication may present risk of side effects. Follow-up in 6 months or sooner if needed   01/27/2021 Jean Rosenthal, MD (Orthopedic) Orders only Note- Gabapentin 300 mg discontinued    12/17/2020 Virgina Evener, LPN [facility]) Orders Note Only-  Discontinued Esomeprazole ([place]) 20 MG capsule    12/08/2020 Tye Savoy, NP [facility]) [facility] Follow-up for LLQ Pain and Diarrhea since December.  CT scan in ED suggests mild sigmoid diverticulitis. Per note if stools studies negative and symptoms persist then we can talk about a colonoscopy, If stool negative for C-diff she can try imodium while we complete workup. Excessive belching, fairly new problem. Multiple  belches in clinic. Already on chronic PPI for history of GERD. Mylanta doesn't help GERD / moderate sized paraesophageal hernia on CT scan. She was recently seen in ED for chest pain and excessive belching on chronic PPI. Symptoms may or may not be related to the paraesophageal hernia. I will ask Dr. Rush Landmark to review the images.     Hospital visits:  Medication Reconciliation was completed by comparing discharge summary, patient's EMR and Pharmacy list, and upon discussion with patient.   Admitted to the hospital on 12/02/2020 due to Acute Pain of L Shoulder, and arm pain. Discharge date was 12/02/2020. Discharged from [facility] Emergency Room Department.     New?Medications Started at [facility] Discharge:?? -started Nexium 20 mg  due to chronic burping issues per note patient's diagnosis is L arm pain.   Medication Changes at Hospital Discharge: None   Medications Discontinued at Hospital Discharge: -Stopped: None    Medications that remain the same after Hospital Discharge:?? -All other medications will remain the same.     Medication Reconciliation was completed by comparing discharge summary, patient's EMR and Pharmacy list, and upon discussion with patient.   Admitted to the hospital on 11/24/2020 due to Chest Pain, Pacemaker, and Diverticulitis. Discharge date was 11/24/2020. Discharged from [facility] Emergency Room Department.     New?Medications Started at [facility] Discharge:?? -started Ondansetron 4 mg PRN, Avelox 400 mg daily for 10 days due to Chest Pain, Pacemaker, and Diverticulitis   Medication Changes at Hospital Discharge: None   Medications Discontinued at Hospital Discharge: -Stopped: None    Medications that remain the same after Hospital Discharge:?? -All other medications  will remain the same.    Medications: Outpatient Encounter Medications as of 05/05/2021  Medication Sig Note   acetaminophen (TYLENOL) 500 MG tablet  Take 500 mg by mouth every 6 (six) hours as needed for mild pain.    acidophilus (RISAQUAD) CAPS capsule Take 1 capsule by mouth daily.    amiodarone (PACERONE) 100 MG tablet TAKE ONE TABLET BY MOUTH EVERY DAY ONCE DAILY IN THE MORNING]    amLODipine (NORVASC) 5 MG tablet TAKE ONE TABLET BY MOUTH EVERY DAY (Patient taking differently: Take 5 mg by mouth daily.)    carboxymethylcellulose (REFRESH PLUS) 0.5 % SOLN Place 1 drop into both eyes 3 (three) times daily as needed (dry eyes).    diclofenac sodium (VOLTAREN) 1 % GEL Apply 2 g topically 4 (four) times daily.    docusate sodium (COLACE) 100 MG capsule Take 200 mg by mouth at bedtime.    donepezil (ARICEPT) 10 MG tablet Take 1 tablet (10 mg total) by mouth at bedtime.    esomeprazole (NEXIUM) 20 MG capsule Take 1 capsule (20 mg total) by mouth daily.    gabapentin (NEURONTIN) 300 MG capsule Take 1 capsule (300 mg total) by mouth 2 (two) times daily.    lidocaine (XYLOCAINE) 5 % ointment Apply 1 application topically as needed.    loratadine (CLARITIN) 10 MG tablet Take 10 mg by mouth daily as needed for allergies.     metoprolol succinate (TOPROL-XL) 25 MG 24 hr tablet Take 1 tablet (25 mg total) by mouth daily. Take with or immediately following a meal.    nitroGLYCERIN (NITROSTAT) 0.4 MG SL tablet dissolve ONE UNDER THE TONGUE EVERY FIVE minutes FOR not more THAN THREE doses (Patient taking differently: Place 0.4 mg under the tongue every 5 (five) minutes as needed for chest pain.)    ondansetron (ZOFRAN) 4 MG tablet Take 1 tablet (4 mg total) by mouth every 8 (eight) hours as needed for up to 20 doses for nausea or vomiting. 12/02/2020: Picked up but never used   pravastatin (PRAVACHOL) 80 MG tablet Take 1 tablet (80 mg total) by mouth daily.    QUEtiapine (SEROQUEL) 50 MG tablet Take 1 tablet (50 mg total) by mouth at bedtime.    XARELTO 20 MG TABS tablet TAKE 1 TABLET BY MOUTH ONCE DAILY WITH SUPPER    No facility-administered encounter  medications on file as of 05/05/2021.   Contacted patient for [facility] pharmacy onboarding. Reviewed medications with patient and uploaded for for CPP review.  Star Rating Drugs: Pravastatin 80 mg last filled 03/03/2021 for a [place] Clinical Pharmacist Assistant [phone]

## 2021-05-09 ENCOUNTER — Telehealth: Payer: Medicare Other

## 2021-05-09 ENCOUNTER — Ambulatory Visit: Payer: Medicare Other

## 2021-05-09 ENCOUNTER — Ambulatory Visit: Payer: Self-pay

## 2021-05-09 DIAGNOSIS — I10 Essential (primary) hypertension: Secondary | ICD-10-CM

## 2021-05-09 DIAGNOSIS — R7309 Other abnormal glucose: Secondary | ICD-10-CM

## 2021-05-09 DIAGNOSIS — E782 Mixed hyperlipidemia: Secondary | ICD-10-CM

## 2021-05-09 DIAGNOSIS — F0280 Dementia in other diseases classified elsewhere without behavioral disturbance: Secondary | ICD-10-CM

## 2021-05-09 MED ORDER — METOCLOPRAMIDE HCL 5 MG PO TABS: ORAL_TABLET | ORAL | 0 refills | Status: DC

## 2021-05-09 NOTE — Addendum Note (Signed)
Addended by: Larey Seat on: 05/09/2021 12:30 PM   Modules accepted: Orders

## 2021-05-09 NOTE — Chronic Care Management (AMB) (Signed)
Chronic Care Management   CCM RN Visit Note  05/09/2021 Name: Jill Bowman MRN: [id number] DOB: [birth year]  Subjective: Jill Bowman is a 87 y.o. year old female who is a primary care patient of [name], [place]. The care management team was consulted for assistance with disease management and care coordination needs.    Collaboration with Daneen Schick BSW  for  Case Collaboration   in response to provider referral for case management and/or care coordination services.   Consent to Services:  The patient was given the following information about Chronic Care Management services today, agreed to services, and gave verbal consent: 1. CCM service includes personalized support from designated clinical staff supervised by the primary care provider, including individualized plan of care and coordination with other care providers 2. 24/7 contact phone numbers for assistance for urgent and routine care needs. 3. Service will only be billed when office clinical staff spend 20 minutes or more in a month to coordinate care. 4. Only one practitioner may furnish and bill the service in a calendar month. 5.The patient may stop CCM services at any time (effective at the end of the month) by phone call to the office staff. 6. The patient will be responsible for cost sharing (co-pay) of up to 20% of the service fee (after annual deductible is met). Patient agreed to services and consent obtained.  Patient agreed to services and verbal consent obtained.   Assessment: Review of patient past medical history, allergies, medications, health status, including review of consultants reports, laboratory and other test data, was performed as part of comprehensive evaluation and provision of chronic care management services.   SDOH (Social Determinants of Health) assessments and interventions performed:  See Care Plan   CCM Care Plan  Allergies  Allergen Reactions   Codeine Itching, Rash and Other (See  Comments)    Full body rash    Hydroxyzine Other (See Comments)    Extreme confusion and hallucinations   Lorazepam Other (See Comments)    Extreme confusion, hallucinations and hyperactivity   Penicillins Anaphylaxis, Hives and Shortness Of Breath    Tolerated cefazolin and ceftriaxone in 2013   Sulfa Antibiotics Shortness Of Breath   Zinc Itching   Ciprofloxacin Other (See Comments)    unknown   Ciprofloxacin Hives and Other (See Comments)    "think I break out in welts"    Latex Rash and Other (See Comments)    Tears skin    Penicillins Other (See Comments)    Unknown    Sulfa Antibiotics Other (See Comments)    unknown    Outpatient Encounter Medications as of 05/09/2021  Medication Sig Note   acetaminophen (TYLENOL) 500 MG tablet Take 500 mg by mouth every 6 (six) hours as needed for mild pain.    acidophilus (RISAQUAD) CAPS capsule Take 1 capsule by mouth daily.    amiodarone (PACERONE) 100 MG tablet TAKE ONE TABLET BY MOUTH EVERY DAY ONCE DAILY IN THE MORNING]    amLODipine (NORVASC) 5 MG tablet TAKE ONE TABLET BY MOUTH EVERY DAY (Patient taking differently: Take 5 mg by mouth daily.)    carboxymethylcellulose (REFRESH PLUS) 0.5 % SOLN Place 1 drop into both eyes 3 (three) times daily as needed (dry eyes).    diclofenac sodium (VOLTAREN) 1 % GEL Apply 2 g topically 4 (four) times daily.    docusate sodium (COLACE) 100 MG capsule Take 200 mg by mouth at bedtime.    donepezil (ARICEPT) 10 MG tablet Take 1  tablet (10 mg total) by mouth at bedtime.    esomeprazole (NEXIUM) 20 MG capsule Take 1 capsule (20 mg total) by mouth daily.    gabapentin (NEURONTIN) 300 MG capsule Take 1 capsule (300 mg total) by mouth 2 (two) times daily.    lidocaine (XYLOCAINE) 5 % ointment Apply 1 application topically as needed.    loratadine (CLARITIN) 10 MG tablet Take 10 mg by mouth daily as needed for allergies.     metoprolol succinate (TOPROL-XL) 25 MG 24 hr tablet Take 1 tablet (25 mg total)  by mouth daily. Take with or immediately following a meal.    nitroGLYCERIN (NITROSTAT) 0.4 MG SL tablet dissolve ONE UNDER THE TONGUE EVERY FIVE minutes FOR not more THAN THREE doses (Patient taking differently: Place 0.4 mg under the tongue every 5 (five) minutes as needed for chest pain.)    ondansetron (ZOFRAN) 4 MG tablet Take 1 tablet (4 mg total) by mouth every 8 (eight) hours as needed for up to 20 doses for nausea or vomiting. 12/02/2020: Picked up but never used   pravastatin (PRAVACHOL) 80 MG tablet Take 1 tablet (80 mg total) by mouth daily.    QUEtiapine (SEROQUEL) 50 MG tablet Take 1 tablet (50 mg total) by mouth at bedtime.    XARELTO 20 MG TABS tablet TAKE 1 TABLET BY MOUTH ONCE DAILY WITH SUPPER    No facility-administered encounter medications on file as of 05/09/2021.    Patient Active Problem List   Diagnosis Date Noted   Abnormal glucose 05/25/2020   Left knee pain 05/06/2020   Type 2 diabetes mellitus ([place]) 04/26/2020   CKD (chronic kidney disease), stage III ([place]) 04/26/2020   SBO (small bowel obstruction) ([place]) 04/26/2020   Transient ischemic attack (TIA) 04/12/2020   Progressive dementia with uncertain etiology ([place]) 04/12/2020   Dementia due to medical condition with behavioral disturbance ([place]) 04/12/2020   Paranoid reaction ([place]) 04/12/2020   Encounter for care of pacemaker 09/12/2019   Stented coronary artery 11/07/2018   Pseudoseizure    Late onset Alzheimer's disease without behavioral disturbance ([place])    GERD (gastroesophageal reflux disease) 01/17/2018   Depression 01/17/2018   Atrial tachycardia ([place]) 01/17/2018   HLD (hyperlipidemia) 01/17/2018   Breast cancer of lower-outer quadrant of right female breast ([place]) 01/03/2016   Open wound of abdominal wall without complication 24/46/2863   Cellulitis of abdominal wall 03/24/2015   Elevated alkaline phosphatase level 12/28/2014   Abdominal wall abscess 10/19/2014   Bradycardia 10/19/2014   Physical  deconditioning 09/30/2012   Soft tissue infection, L leg 09/11/2012   CAD (coronary artery disease) 07/16/2012   Pacemaker  Dual chamber Medtronic Adapta L ADDRL1  07/16/2012   Morbid obesity ([place]) 07/16/2012   Degloving injury of lower leg, Left 07/04/2012   Tachycardia-bradycardia syndrome ([place])     Conditions to be addressed/monitored: Abnormal glucose, Mixed hyperlipidemia, Essential hypertension  Care Plan : Assist with Chronic Care Management and Care Coordination needs  Updates made by Lynne Logan, RN since 05/09/2021 12:00 AM     Problem: Assist with Chronic Care Management and Care Coordination needs   Priority: High     Goal: Assist with Chronic Care Management and Care Coordination needs   Start Date: 05/09/2021  Expected End Date: 07/01/2021  This Visit's Progress: On track  Priority: High  Note:   Current Barriers:  Chronic Disease Management support, education, chronic care management and care coordination needs related to Abnormal glucose, Mixed hyperlipidemia, Essential hypertension with RNCM,  SW and Pharmacy Care Management and Care coordination needs Case Manager Clinical Goal(s):  Patient will work with the CCM team to address needs related to chronic care management and care coordination needs related to Abnormal glucose, Mixed hyperlipidemia, Essential hypertension with RNCM, SW and Pharmacy Care Management and Care Coordination needs Interventions:  Collaborated with BSW to initiate plan of care to address needs related to chronic care management and care coordination needs related to Abnormal glucose, Mixed hyperlipidemia, Essential hypertension with RNCM, SW and Pharmacy Care Management and Care Coordination needs Collaboration with [name] FNP regarding development and update of comprehensive plan of care as evidenced by provider attestation and co-signature Inter-disciplinary care team collaboration (see longitudinal plan of care) Patient  Goals/Self-Care Activities patient will:   - Patient will work with the CCM team to address chronic care management and care coordination needs and will continue to work with the clinical team to address health care and disease management related needs.    Follow Up Plan: The care management team will reach out to the patient again over the next 45-60 days.       Plan:Telephone follow up appointment with care management team member scheduled for:  06/29/21  Barb Merino, RN, BSN, CCM Care Management Coordinator [place] [facility]  Direct Phone: [phone]

## 2021-05-09 NOTE — Telephone Encounter (Signed)
Megan,  I filled for only bid and for 15 days, that should give time for observation -  please ask for a return report after that period. Reglan is used for chronic belching and chronic hic-ups. CD

## 2021-05-10 NOTE — Patient Instructions (Signed)
Social Worker Visit Information  Goals we discussed today:   Goals Addressed             This Visit's Progress    Barriers to Treatment Identified and Managed       Timeframe:  Long-Range Goal Priority:  Low Start Date:  6.13.22                           Expected End Date:  9.12.22                     Next planned outreach: 7.11.22  Patient Goals/Self-Care Activities patient will: with the help of her daughter  - Review mailed transportation resource information -Engage with Consulting civil engineer to develop an individualized plan of care          Materials provided: Yes: Provided verbal and written education on transportation resource [place] [facility]  Jill Bowman was given information about Chronic Care Management services today including:  CCM service includes personalized support from designated clinical staff supervised by her physician, including individualized plan of care and coordination with other care providers 24/7 contact phone numbers for assistance for urgent and routine care needs. Service will only be billed when office clinical staff spend 20 minutes or more in a month to coordinate care. Only one practitioner may furnish and bill the service in a calendar month. The patient may stop CCM services at any time (effective at the end of the month) by phone call to the office staff. The patient will be responsible for cost sharing (co-pay) of up to 20% of the service fee (after annual deductible is met).  Patient agreed to services and verbal consent obtained.   Patient verbalizes understanding of instructions provided today and agrees to view in [place].   Follow up plan: SW will follow up with patient by phone over the next 45 days  Jill Bowman, BSW, CDP Social Worker, Certified Dementia Practitioner [place] / [place] [facility] [phone]

## 2021-05-10 NOTE — Chronic Care Management (AMB) (Signed)
Chronic Care Management    Social Work Note  05/09/2021 Name: Jill Bowman MRN: [id number] DOB: 1929-04-30  Jill Bowman is a 87 y.o. year old female who is a primary care patient of [name], [place]. The CCM team was consulted to assist the patient with chronic disease management and/or care coordination needs related to: [employer] .   Engaged with patient daughter/caregiver [name] by phone  for initial visit in response to provider referral for social work chronic care management and care coordination services.   Consent to Services:  The patient was given the following information about Chronic Care Management services today, agreed to services, and gave verbal consent: 1. CCM service includes personalized support from designated clinical staff supervised by the primary care provider, including individualized plan of care and coordination with other care providers 2. 24/7 contact phone numbers for assistance for urgent and routine care needs. 3. Service will only be billed when office clinical staff spend 20 minutes or more in a month to coordinate care. 4. Only one practitioner may furnish and bill the service in a calendar month. 5.The patient may stop CCM services at any time (effective at the end of the month) by phone call to the office staff. 6. The patient will be responsible for cost sharing (co-pay) of up to 20% of the service fee (after annual deductible is met). Patient agreed to services and consent obtained.  Patient agreed to services and consent obtained.   Assessment: Review of patient past medical history, allergies, medications, and health status, including review of relevant consultants reports was performed today as part of a comprehensive evaluation and provision of chronic care management and care coordination services.     SDOH (Social Determinants of Health) assessments and interventions performed:  SDOH Interventions    Flowsheet Row Most  Recent Value  SDOH Interventions   Food Insecurity Interventions Intervention Not Indicated  Housing Interventions Intervention Not Indicated  Transportation Interventions Intervention Not Indicated        Advanced Directives Status: Not addressed in this encounter.  CCM Care Plan  Allergies  Allergen Reactions   Codeine Itching, Rash and Other (See Comments)    Full body rash    Hydroxyzine Other (See Comments)    Extreme confusion and hallucinations   Lorazepam Other (See Comments)    Extreme confusion, hallucinations and hyperactivity   Penicillins Anaphylaxis, Hives and Shortness Of Breath    Tolerated cefazolin and ceftriaxone in 2013   Sulfa Antibiotics Shortness Of Breath   Zinc Itching   Ciprofloxacin Other (See Comments)    unknown   Ciprofloxacin Hives and Other (See Comments)    "think I break out in welts"    Latex Rash and Other (See Comments)    Tears skin    Penicillins Other (See Comments)    Unknown    Sulfa Antibiotics Other (See Comments)    unknown    Outpatient Encounter Medications as of 05/09/2021  Medication Sig Note   acetaminophen (TYLENOL) 500 MG tablet Take 500 mg by mouth every 6 (six) hours as needed for mild pain.    acidophilus (RISAQUAD) CAPS capsule Take 1 capsule by mouth daily.    amiodarone (PACERONE) 100 MG tablet TAKE ONE TABLET BY MOUTH EVERY DAY ONCE DAILY IN THE MORNING]    amLODipine (NORVASC) 5 MG tablet TAKE ONE TABLET BY MOUTH EVERY DAY (Patient taking differently: Take 5 mg by mouth daily.)    carboxymethylcellulose (REFRESH PLUS) 0.5 % SOLN Place 1  drop into both eyes 3 (three) times daily as needed (dry eyes).    diclofenac sodium (VOLTAREN) 1 % GEL Apply 2 g topically 4 (four) times daily.    docusate sodium (COLACE) 100 MG capsule Take 200 mg by mouth at bedtime.    donepezil (ARICEPT) 10 MG tablet Take 1 tablet (10 mg total) by mouth at bedtime.    esomeprazole (NEXIUM) 20 MG capsule Take 1 capsule (20 mg total) by  mouth daily.    gabapentin (NEURONTIN) 300 MG capsule Take 1 capsule (300 mg total) by mouth 2 (two) times daily.    lidocaine (XYLOCAINE) 5 % ointment Apply 1 application topically as needed.    loratadine (CLARITIN) 10 MG tablet Take 10 mg by mouth daily as needed for allergies.     metoCLOPramide (REGLAN) 5 MG tablet Use before lunch and after dinner - 15 days, bid po.    metoprolol succinate (TOPROL-XL) 25 MG 24 hr tablet Take 1 tablet (25 mg total) by mouth daily. Take with or immediately following a meal.    nitroGLYCERIN (NITROSTAT) 0.4 MG SL tablet dissolve ONE UNDER THE TONGUE EVERY FIVE minutes FOR not more THAN THREE doses (Patient taking differently: Place 0.4 mg under the tongue every 5 (five) minutes as needed for chest pain.)    ondansetron (ZOFRAN) 4 MG tablet Take 1 tablet (4 mg total) by mouth every 8 (eight) hours as needed for up to 20 doses for nausea or vomiting. 12/02/2020: Picked up but never used   pravastatin (PRAVACHOL) 80 MG tablet Take 1 tablet (80 mg total) by mouth daily.    QUEtiapine (SEROQUEL) 50 MG tablet Take 1 tablet (50 mg total) by mouth at bedtime.    XARELTO 20 MG TABS tablet TAKE 1 TABLET BY MOUTH ONCE DAILY WITH SUPPER    No facility-administered encounter medications on file as of 05/09/2021.    Patient Active Problem List   Diagnosis Date Noted   Abnormal glucose 05/25/2020   Left knee pain 05/06/2020   Type 2 diabetes mellitus ([place]) 04/26/2020   CKD (chronic kidney disease), stage III ([place]) 04/26/2020   SBO (small bowel obstruction) ([place]) 04/26/2020   Transient ischemic attack (TIA) 04/12/2020   Progressive dementia with uncertain etiology ([place]) 04/12/2020   Dementia due to medical condition with behavioral disturbance ([place]) 04/12/2020   Paranoid reaction ([place]) 04/12/2020   Encounter for care of pacemaker 09/12/2019   Stented coronary artery 11/07/2018   Pseudoseizure    Late onset Alzheimer's disease without behavioral disturbance ([place])     GERD (gastroesophageal reflux disease) 01/17/2018   Depression 01/17/2018   Atrial tachycardia ([place]) 01/17/2018   HLD (hyperlipidemia) 01/17/2018   Breast cancer of lower-outer quadrant of right female breast ([place]) 01/03/2016   Open wound of abdominal wall without complication 88/91/6945   Cellulitis of abdominal wall 03/24/2015   Elevated alkaline phosphatase level 12/28/2014   Abdominal wall abscess 10/19/2014   Bradycardia 10/19/2014   Physical deconditioning 09/30/2012   Soft tissue infection, L leg 09/11/2012   CAD (coronary artery disease) 07/16/2012   Pacemaker  Dual chamber Medtronic Adapta L ADDRL1  07/16/2012   Morbid obesity ([place]) 07/16/2012   Degloving injury of lower leg, Left 07/04/2012   Tachycardia-bradycardia syndrome ([place])     Conditions to be addressed/monitored:  Alzheimer's Disease, HTN, Mixed Hyperlipidemia, and Abnormal Glucose ; Transportation  Care Plan : Social Work [place]  Updates made by Daneen Schick since 05/10/2021 12:00 AM     Problem: Barriers to  Treatment      Long-Range Goal: Barriers to Treatment Identified and Managed   Start Date: 05/09/2021  Expected End Date: 08/08/2021  Priority: Low  Note:   Late entry from 6.13.22  Current Barriers:  Chronic disease management support and education needs related to  Alzheimer's Disease, HTN, Mixed Hyperlipidemia, and Abnormal Glucose   Transportation Financial strain related to the cost of incontinent supplies  Education officer, museum Clinical Goal(s):  patient will work with SW to identify and address any acute and/or chronic care coordination needs related to the self health management of  Alzheimer's Disease, HTN, Mixed Hyperlipidemia, and Abnormal Glucose   Patient will work with SW to become more knowledgeable of transportation resources Patient will work with SW to identify possible resources to assist with incontinent products SW Interventions:  Inter-disciplinary care team collaboration (see  longitudinal plan of care) Collaboration with [name], FNP regarding development and update of comprehensive plan of care as evidenced by provider attestation and co-signature Successful outbound call placed to the patients daughter, Carlton Adam to complete [facility] screen Completed [facility] screen to note patient does not have acute resource needs Discussed the patient would benefit from a transportation resource option in the even [place] is not able to transport the patient Provided verbal and written education on [employer] and [employer] (TAMS) Determined the patient uses incontinent products which do contribute to [place] that [place] will sometimes offer briefs to community members Collaboration with Francene Finders to inquire if briefs are available at this time Discussed plan to work with SW to address resource needs while patient engages with  RN Case Freight forwarder  to address care management needs Collaboration with [place] to discus interventions and plan Scheduled follow up call over the next 45 days  Patient Goals/Self-Care Activities patient will: with the help of her daughter  -  Review mailed transportation resource information -Engage with RN Care Manager to develop an individualized plan of care  Follow Up Plan: The care management team will reach out to the patient again over the next 45 days.        Follow Up Plan: SW will follow up with patient by phone over the next 45 days      Daneen Schick, BSW, CDP Social Worker, Certified Dementia Practitioner [place] [facility] [phone]  Total time spent performing care coordination and/or care management activities with the patient by phone or face to face = 55 minutes.

## 2021-05-19 NOTE — Patient Instructions (Signed)
Visit Information It was great speaking with you today!  Please let me know if you have any questions about our visit.   Goals Addressed             This Visit's Progress    Manage My Medicine       Timeframe:  Long-Range Goal Priority:  High Start Date:                             Expected End Date:                       Follow Up Date 06/22/2021    - call for medicine refill 2 or 3 days before it runs out - keep a list of all the medicines I take; vitamins and herbals too - use a pillbox to sort medicine - use an alarm clock or phone to remind me to take my medicine    Why is this important?   These steps will help you keep on track with your medicines.   Notes:  Patient to begin pill packaging system for medication          Patient Care Plan: CCM Pharmacy Care Plan     Problem Identified: HTN, HLD, CKD, GERD, DEPRESSION, [place], CAD      Long-Range Goal: Disease Management   This Visit's Progress: On track  Note:    Current Barriers:  Unable to independently monitor therapeutic efficacy  Pharmacist Clinical Goal(s):  Patient will achieve adherence to monitoring guidelines and medication adherence to achieve therapeutic efficacy through collaboration with PharmD and provider.   Interventions: 1:1 collaboration with [name], FNP regarding development and update of comprehensive plan of care as evidenced by provider attestation and co-signature Inter-disciplinary care team collaboration (see longitudinal plan of care) Comprehensive medication review performed; medication list updated in electronic medical record  Hypertension (BP goal <140/90) -Controlled -Current treatment: Amlodipine 5 mg taking 1 tablet daily  Metoprolol Succinate 25 mg tablet once per day  -Current home readings: patient current home readings are doing well  -Current dietary habits: eating healthy vegetables and fresh fruits -Current exercise habits: patient does some walking  around the house, and she also does some dancing.  -Denies hypotensive/hypertensive symptoms -Educated on Daily salt intake goal < 2300 mg; Importance of home blood pressure monitoring; Proper BP monitoring technique; -Counseled to monitor BP at home at least four times per week, document, and provide log at future appointments -Recommended to continue current medication  Coronary Artery Disease/TIA: (LDL goal < 70) -Controlled -Current treatment: Pravastatin 80 mg tablet once per day Nitroglycerin 0.4 mg SL - dissolve one under the tongue every five minutes for no more that three doses. Amlodipine 5 mg tablet once per day  Metoprolol Succinate 25 mg - take 1 tablet by mouth daily.  Amiodarone 100 mg - take 1 tablet by mouth once daily in the morning Xarelto 20 mg tablet once daily with supper -Current dietary patterns: Eating mostly vegetables and fruits.  -Current exercise habits:please see hypertension  -Jill Bowman's daughter keeps all of her medications organized based on the time that she takes them. She would prefer a pill packaging system that would make it easier for the patient take all of her medications at one time.  -Educated on Cholesterol goals;  Importance of limiting foods high in cholesterol; -Recommended to continue current medication   Late Onselt Alzheimers  -Not ideally  controlled -Current treatment  Gabapentin 300 mg capsule - take 1 capsule by mouth two times daily Quetiapine 50 mg tablet at bedtime Donepezil 10 mg tablet at bedtime  Metoclopramide 5 mg tablet  Patient has not started taking this medication -Patient daughter reports that her mother is doing well with her memory.  -She is concerned about her frequent belching because her mother reports it is painful.  -She communicated this information to the Neurology team and was given the option of starting Metoclopramide 5 mg tablet to help with the belching.  -Jill Bowman requested that I review her  mothers current medications to determine if there are any interactions.  -Reviewed patients current medication list and determined that there is an interaction.   -Quetiapine and Metoclopramide are not recommended in combination due to adverse/toxic effects of antispychotic agents. Coadministration should be avoided due to the development of extrapyramidal reactions or neuroleptic malignant syndrome.  -Recommended to continue current medication Collaborated with patients daughter and explained the side effect. She reported that she was not started on Reglan when the presciption was sent in last week because of concern about possible side effects.  Health Maintenance -Vaccine gaps: Zoster Vaccines (Shingrix), PNA   -Recommended patient be vaccinated for Shingles and PNA.   Patient Goals/Self-Care Activities Patient will:  - take medications as prescribed  Follow Up Plan: The patient has been provided with contact information for the care management team and has been advised to call with any health related questions or concerns.        Jill Bowman was given information about Chronic Care Management services today including:  CCM service includes personalized support from designated clinical staff supervised by her physician, including individualized plan of care and coordination with other care providers 24/7 contact phone numbers for assistance for urgent and routine care needs. Standard insurance, coinsurance, copays and deductibles apply for chronic care management only during months in which we provide at least 20 minutes of these services. Most insurances cover these services at 100%, however patients may be responsible for any copay, coinsurance and/or deductible if applicable. This service may help you avoid the need for more expensive face-to-face services. Only one practitioner may furnish and bill the service in a calendar month. The patient may stop CCM services at any time  (effective at the end of the month) by phone call to the office staff.  Patient agreed to services and verbal consent obtained.   The patient verbalized understanding of instructions, educational materials, and care plan provided today and agreed to receive a mailed copy of patient instructions, educational materials, and care plan.   Orlando Penner, PharmD Clinical Pharmacist [facility] [phone]

## 2021-05-26 ENCOUNTER — Emergency Department (HOSPITAL_COMMUNITY)
Admission: EM | Admit: 2021-05-26 | Discharge: 2021-05-26 | Disposition: A | Discharge status: Home / Self Care | Payer: Medicare Other | Attending: Emergency Medicine | Admitting: Emergency Medicine

## 2021-05-26 ENCOUNTER — Emergency Department (HOSPITAL_COMMUNITY): Payer: Medicare Other

## 2021-05-26 ENCOUNTER — Encounter (HOSPITAL_COMMUNITY): Payer: Self-pay | Admitting: Emergency Medicine

## 2021-05-26 DIAGNOSIS — Z7901 Long term (current) use of anticoagulants: Secondary | ICD-10-CM | POA: Insufficient documentation

## 2021-05-26 DIAGNOSIS — Z87891 Personal history of nicotine dependence: Secondary | ICD-10-CM | POA: Insufficient documentation

## 2021-05-26 DIAGNOSIS — S300XXA Contusion of lower back and pelvis, initial encounter: Secondary | ICD-10-CM | POA: Insufficient documentation

## 2021-05-26 DIAGNOSIS — S0003XA Contusion of scalp, initial encounter: Secondary | ICD-10-CM | POA: Diagnosis not present

## 2021-05-26 DIAGNOSIS — E1122 Type 2 diabetes mellitus with diabetic chronic kidney disease: Secondary | ICD-10-CM | POA: Diagnosis not present

## 2021-05-26 DIAGNOSIS — F0390 Unspecified dementia without behavioral disturbance: Secondary | ICD-10-CM | POA: Insufficient documentation

## 2021-05-26 DIAGNOSIS — R0789 Other chest pain: Secondary | ICD-10-CM | POA: Insufficient documentation

## 2021-05-26 DIAGNOSIS — I2510 Atherosclerotic heart disease of native coronary artery without angina pectoris: Secondary | ICD-10-CM | POA: Diagnosis not present

## 2021-05-26 DIAGNOSIS — Y92000 Kitchen of unspecified non-institutional (private) residence as  the place of occurrence of the external cause: Secondary | ICD-10-CM | POA: Diagnosis not present

## 2021-05-26 DIAGNOSIS — M542 Cervicalgia: Secondary | ICD-10-CM | POA: Diagnosis not present

## 2021-05-26 DIAGNOSIS — Z96653 Presence of artificial knee joint, bilateral: Secondary | ICD-10-CM | POA: Diagnosis not present

## 2021-05-26 DIAGNOSIS — R109 Unspecified abdominal pain: Secondary | ICD-10-CM | POA: Insufficient documentation

## 2021-05-26 DIAGNOSIS — I129 Hypertensive chronic kidney disease with stage 1 through stage 4 chronic kidney disease, or unspecified chronic kidney disease: Secondary | ICD-10-CM | POA: Diagnosis not present

## 2021-05-26 DIAGNOSIS — Z950 Presence of cardiac pacemaker: Secondary | ICD-10-CM | POA: Insufficient documentation

## 2021-05-26 DIAGNOSIS — Z91040 Latex allergy status: Secondary | ICD-10-CM | POA: Insufficient documentation

## 2021-05-26 DIAGNOSIS — W19XXXA Unspecified fall, initial encounter: Secondary | ICD-10-CM

## 2021-05-26 DIAGNOSIS — Z79899 Other long term (current) drug therapy: Secondary | ICD-10-CM | POA: Insufficient documentation

## 2021-05-26 DIAGNOSIS — W01198A Fall on same level from slipping, tripping and stumbling with subsequent striking against other object, initial encounter: Secondary | ICD-10-CM | POA: Diagnosis not present

## 2021-05-26 DIAGNOSIS — J45909 Unspecified asthma, uncomplicated: Secondary | ICD-10-CM | POA: Insufficient documentation

## 2021-05-26 DIAGNOSIS — N183 Chronic kidney disease, stage 3 unspecified: Secondary | ICD-10-CM | POA: Diagnosis not present

## 2021-05-26 DIAGNOSIS — S0990XA Unspecified injury of head, initial encounter: Secondary | ICD-10-CM | POA: Diagnosis present

## 2021-05-26 LAB — CBC
HCT: 34.6 % — ABNORMAL LOW (ref 36.0–46.0)
Hemoglobin: 11.1 g/dL — ABNORMAL LOW (ref 12.0–15.0)
MCH: 32.3 pg (ref 26.0–34.0)
MCHC: 32.1 g/dL (ref 30.0–36.0)
MCV: 100.6 fL — ABNORMAL HIGH (ref 80.0–100.0)
Platelets: 202 10*3/uL (ref 150–400)
RBC: 3.44 MIL/uL — ABNORMAL LOW (ref 3.87–5.11)
RDW: 14.9 % (ref 11.5–15.5)
WBC: 6.2 10*3/uL (ref 4.0–10.5)
nRBC: 0 % (ref 0.0–0.2)

## 2021-05-26 LAB — BASIC METABOLIC PANEL
Anion gap: 7 (ref 5–15)
BUN: 16 mg/dL (ref 8–23)
CO2: 25 mmol/L (ref 22–32)
Calcium: 8.7 mg/dL — ABNORMAL LOW (ref 8.9–10.3)
Chloride: 107 mmol/L (ref 98–111)
Creatinine, Ser: 1.2 mg/dL — ABNORMAL HIGH (ref 0.44–1.00)
GFR, Estimated: 43 mL/min — ABNORMAL LOW (ref >60)
Glucose, Bld: 73 mg/dL (ref 70–99)
Potassium: 3.9 mmol/L (ref 3.5–5.1)
Sodium: 139 mmol/L (ref 135–145)

## 2021-05-26 MED ORDER — IOHEXOL 300 MG/ML  SOLN
80.0000 mL | Freq: Once | INTRAMUSCULAR | Status: AC | PRN
  Administered 2021-05-26: 80 mL via INTRAVENOUS
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
## 2021-05-26 NOTE — ED Triage Notes (Signed)
Patient BIB family for evaluation of a fall on 6/27. Patient is supposed to walk with a walker but sometimes chooses not to. Patient fell backwards onto back. Bruising to posterior right head, right neck, right buttocks. Patient has dementia.

## 2021-05-26 NOTE — Discharge Instructions (Addendum)
The CT scan showed a potential lumbar transverse process fracture.  Her pain medicines may help with this.  There also may be was a little thickening in her colon that can be followed as an outpatient.  Talk with her doctors about whether she needs more help at home with her ambulation and potentially if she needs a change in her anticoagulation because of her falls.

## 2021-05-26 NOTE — ED Notes (Signed)
Patient transported to CT 

## 2021-05-26 NOTE — ED Notes (Signed)
RN reviewed discharge instructions w/ pt and caregiver. Follow up and use of walker reviewed, no further questions

## 2021-05-26 NOTE — ED Provider Notes (Signed)
Emergency Medicine Provider Triage Evaluation Note  Jill Bowman , a 87 y.o. female  was evaluated in triage.  Patient has a history of dementia at baseline is alert to person and place.  Pt complains of fall, neck pain, and back pain.  Patient suffered fall on 6/27.  Fall was witnessed by her daughter.  No loss of consciousness.  Patient did hit her head.  Patient is on Xarelto.  Patient has complaint of pain to her head, neck, and thoracic back since then.  No change in mental status.  Exam patient is making vocalization.  Patient's daughter reports that this is baseline for her and has been over the last month.  Review of Systems  Positive: Neck pain, back pain, back pain to posterior head Negative: Loss of consciousness, slurred speech, facial asymmetry, numbness, weakness, saddle anesthesia, bowel or bladder dysfunction  Physical Exam  BP 129/60 (BP Location: Left Arm)   Pulse (!) 59   Temp 98.8 F (37.1 C) (Oral)   Resp 17   SpO2 98%  Gen:   Awake, no distress   Resp:  Normal effort  MSK:   Moves extremities without difficulty, +5 strength to bilateral upper and lower extremities, tenderness to right sternocleidomastoid and right trapezius muscles,  Other:  Diffuse tenderness throughout patient's thoracic back, contusion to occipital region of patient's head, CN II through XII intact,  Medical Decision Making  Medically screening exam initiated at 2:57 PM.  Appropriate orders placed.  Anaiya Hedgepeth was informed that the remainder of the evaluation will be completed by another provider, this initial triage assessment does not replace that evaluation, and the importance of remaining in the ED until their evaluation is complete.  The patient appears stable so that the remainder of the work up may be completed by another provider.      Loni Beckwith, PA-C 05/26/21 1500    Dorie Rank, MD 05/27/21 [id number]

## 2021-05-31 ENCOUNTER — Other Ambulatory Visit: Payer: Self-pay | Admitting: Cardiology

## 2021-05-31 DIAGNOSIS — E7800 Pure hypercholesterolemia, unspecified: Secondary | ICD-10-CM

## 2021-06-01 ENCOUNTER — Telehealth: Payer: Medicare Other | Admitting: Nurse Practitioner

## 2021-06-01 ENCOUNTER — Other Ambulatory Visit: Payer: Self-pay

## 2021-06-06 ENCOUNTER — Ambulatory Visit (INDEPENDENT_AMBULATORY_CARE_PROVIDER_SITE_OTHER): Payer: Medicare Other

## 2021-06-06 DIAGNOSIS — I10 Essential (primary) hypertension: Secondary | ICD-10-CM | POA: Diagnosis not present

## 2021-06-06 DIAGNOSIS — F0280 Dementia in other diseases classified elsewhere without behavioral disturbance: Secondary | ICD-10-CM | POA: Diagnosis not present

## 2021-06-06 DIAGNOSIS — G301 Alzheimer's disease with late onset: Secondary | ICD-10-CM | POA: Diagnosis not present

## 2021-06-06 NOTE — Patient Instructions (Signed)
Social Worker Visit Information  Goals we discussed today:   Goals Addressed             This Visit's Progress    Barriers to Treatment Identified and Managed       Timeframe:  Long-Range Goal Priority:  Low Start Date:  6.13.22                           Expected End Date:  9.12.22                     Next planned outreach: 9.8.22  Patient Goals/Self-Care Activities patient will: with the help of her daughter  - Review mailed caregiver resource information -Engage with RN Care Manager to develop an individualized plan of care          Materials Provided: Yes: verbal and written information on caregiver resources  Follow Up Plan: SW will follow up with patient by phone over the next 60 days   Daneen Schick, BSW, CDP Social Worker, Certified Dementia Practitioner [place] [facility] [place] [facility] [phone]

## 2021-06-06 NOTE — Chronic Care Management (AMB) (Signed)
Chronic Care Management    Social Work Note  06/06/2021 Name: Jill Bowman MRN: [id number] DOB: 1929-10-24  Jill Bowman is a 87 y.o. year old female who is a primary care patient of [name], [place]. The CCM team was consulted to assist the patient with chronic disease management and/or care coordination needs related to: [employer] .   Engaged with patient daughter Jill Bowman  for follow up visit in response to provider referral for social work chronic care management and care coordination services.   Consent to Services:  The patient was given information about Chronic Care Management services, agreed to services, and gave verbal consent prior to initiation of services.  Please see initial visit note for detailed documentation.   Patient agreed to services and consent obtained.   Assessment: Review of patient past medical history, allergies, medications, and health status, including review of relevant consultants reports was performed today as part of a comprehensive evaluation and provision of chronic care management and care coordination services.     SDOH (Social Determinants of Health) assessments and interventions performed:    Advanced Directives Status: Not addressed in this encounter.  CCM Care Plan  Allergies  Allergen Reactions   Codeine Itching, Rash and Other (See Comments)    Full body rash    Hydroxyzine Other (See Comments)    Extreme confusion and hallucinations   Lorazepam Other (See Comments)    Extreme confusion, hallucinations and hyperactivity   Penicillins Anaphylaxis, Hives and Shortness Of Breath    Tolerated cefazolin and ceftriaxone in 2013   Sulfa Antibiotics Shortness Of Breath   Zinc Itching   Ciprofloxacin Other (See Comments)    unknown   Ciprofloxacin Hives and Other (See Comments)    "think I break out in welts"    Latex Rash and Other (See Comments)    Tears skin    Penicillins Other (See Comments)    Unknown     Sulfa Antibiotics Other (See Comments)    unknown    Outpatient Encounter Medications as of 06/06/2021  Medication Sig Note   acetaminophen (TYLENOL) 500 MG tablet Take 500 mg by mouth every 6 (six) hours as needed for mild pain.    acidophilus (RISAQUAD) CAPS capsule Take 1 capsule by mouth daily.    amiodarone (PACERONE) 100 MG tablet TAKE ONE TABLET BY MOUTH EVERY DAY ONCE DAILY IN THE MORNING]    amLODipine (NORVASC) 5 MG tablet TAKE ONE TABLET BY MOUTH EVERY DAY (Patient taking differently: Take 5 mg by mouth daily.)    carboxymethylcellulose (REFRESH PLUS) 0.5 % SOLN Place 1 drop into both eyes 3 (three) times daily as needed (dry eyes).    diclofenac sodium (VOLTAREN) 1 % GEL Apply 2 g topically 4 (four) times daily.    docusate sodium (COLACE) 100 MG capsule Take 200 mg by mouth at bedtime.    donepezil (ARICEPT) 10 MG tablet Take 1 tablet (10 mg total) by mouth at bedtime.    esomeprazole (NEXIUM) 20 MG capsule Take 1 capsule (20 mg total) by mouth daily.    gabapentin (NEURONTIN) 300 MG capsule Take 1 capsule (300 mg total) by mouth 2 (two) times daily.    lidocaine (XYLOCAINE) 5 % ointment Apply 1 application topically as needed.    loratadine (CLARITIN) 10 MG tablet Take 10 mg by mouth daily as needed for allergies.     metoCLOPramide (REGLAN) 5 MG tablet Use before lunch and after dinner - 15 days, bid po.  metoprolol succinate (TOPROL-XL) 25 MG 24 hr tablet Take 1 tablet (25 mg total) by mouth daily. Take with or immediately following a meal.    nitroGLYCERIN (NITROSTAT) 0.4 MG SL tablet dissolve ONE UNDER THE TONGUE EVERY FIVE minutes FOR not more THAN THREE doses (Patient taking differently: Place 0.4 mg under the tongue every 5 (five) minutes as needed for chest pain.)    ondansetron (ZOFRAN) 4 MG tablet Take 1 tablet (4 mg total) by mouth every 8 (eight) hours as needed for up to 20 doses for nausea or vomiting. 12/02/2020: Picked up but never used   pravastatin (PRAVACHOL) 80  MG tablet Take 1 tablet (80 mg total) by mouth daily.    QUEtiapine (SEROQUEL) 50 MG tablet Take 1 tablet (50 mg total) by mouth at bedtime.    XARELTO 20 MG TABS tablet TAKE 1 TABLET BY MOUTH ONCE DAILY WITH SUPPER    No facility-administered encounter medications on file as of 06/06/2021.    Patient Active Problem List   Diagnosis Date Noted   Abnormal glucose 05/25/2020   Left knee pain 05/06/2020   Type 2 diabetes mellitus ([place]) 04/26/2020   CKD (chronic kidney disease), stage III ([place]) 04/26/2020   SBO (small bowel obstruction) ([place]) 04/26/2020   Transient ischemic attack (TIA) 04/12/2020   Progressive dementia with uncertain etiology ([place]) 04/12/2020   Dementia due to medical condition with behavioral disturbance ([place]) 04/12/2020   Paranoid reaction ([place]) 04/12/2020   Encounter for care of pacemaker 09/12/2019   Stented coronary artery 11/07/2018   Pseudoseizure    Late onset Alzheimer's disease without behavioral disturbance ([place])    GERD (gastroesophageal reflux disease) 01/17/2018   Depression 01/17/2018   Atrial tachycardia ([place]) 01/17/2018   HLD (hyperlipidemia) 01/17/2018   Breast cancer of lower-outer quadrant of right female breast ([place]) 01/03/2016   Open wound of abdominal wall without complication 96/78/9381   Cellulitis of abdominal wall 03/24/2015   Elevated alkaline phosphatase level 12/28/2014   Abdominal wall abscess 10/19/2014   Bradycardia 10/19/2014   Physical deconditioning 09/30/2012   Soft tissue infection, L leg 09/11/2012   CAD (coronary artery disease) 07/16/2012   Pacemaker  Dual chamber Medtronic Adapta L ADDRL1  07/16/2012   Morbid obesity ([place]) 07/16/2012   Degloving injury of lower leg, Left 07/04/2012   Tachycardia-bradycardia syndrome ([place])     Conditions to be addressed/monitored: HTN and Alzheimer's Disease ; Limited access to caregiver  Care Plan : Social Work [facility]  Updates made by Daneen Schick since 06/06/2021 12:00 AM      Problem: Barriers to Treatment      Long-Range Goal: Barriers to Treatment Identified and Managed   Start Date: 05/09/2021  Expected End Date: 08/08/2021  This Visit's Progress: On track  Priority: Low  Note:   Current Barriers:  Chronic disease management support and education needs related to  Alzheimer's Disease, HTN, Mixed Hyperlipidemia, and Abnormal Glucose   Transportation Financial strain related to the cost of incontinent supplies  Social Worker Clinical Goal(s):  patient will work with SW to identify and address any acute and/or chronic care coordination needs related to the self health management of  Alzheimer's Disease, HTN, Mixed Hyperlipidemia, and Abnormal Glucose   Patient will work with SW to become more knowledgeable of transportation resources Patient will work with SW to identify possible resources to assist with incontinent products Patient will work with SW to become more knowledgeable of caregiver resources  SW Interventions:  Inter-disciplinary care team collaboration (see  longitudinal plan of care) Collaboration with [name], FNP regarding development and update of comprehensive plan of care as evidenced by provider attestation and co-signature Successful outbound call placed to the patients daughter, Jill Bowman to assess goal progression Determined Jill Bowman did received mailed resource information regarding [facility] - reported she contacted the agency and was told they would not service the patient in her area Jill Bowman indicates the patient is becoming less mobile therefore she does not plan to need transportation resources at this time Furthermore, patient will be leaving Jill Bowman' home on August 1 to stay with a different daughter for the next four month while Jill Bowman recovers from a knee replacement - discussed patient contact information will be updated  Advised Jill Bowman SW did confirm briefs are available for  pick up at [employer] Determined this is a far drive and not worth it at this time as the patient has funds to cover the cost Reviewed the patients family may be in need of caregiver resources as she continues to decline and need more and more supervision Educated Jill Bowman on options including Medicaid PCS, [place] in home aid program, and privately hiring a caregiver Determined the patient does not qualify for Medicaid and the family is not interested in the in home aid program due to length of wait list Provided Jill Bowman with a list of caregiver agencies by mail as requested for her to review with family Scheduled follow up call over the next 60 days   Patient Goals/Self-Care Activities patient will: with the help of her daughter  -  Review mailed caregiver resource information -Engage with RN Care Manager to develop an individualized plan of care  Follow Up Plan: The care management team will reach out to the patient again over the next 60 days.        Follow Up Plan: SW will follow up with patient by phone over the next 60 days      Daneen Schick, BSW, CDP Social Worker, Certified Dementia Practitioner [place] [facility] [place] [facility] [phone]

## 2021-06-17 ENCOUNTER — Telehealth: Payer: Self-pay | Admitting: Cardiology

## 2021-06-21 ENCOUNTER — Telehealth: Payer: Self-pay

## 2021-06-21 NOTE — Chronic Care Management (AMB) (Signed)
    No answer, unable to leave VM on patient's line or daughter's line of telephone appointment with Orlando Penner CPP on 06-22-2021 at 2:00.   [place] Pharmacist Assistant [phone]

## 2021-06-22 ENCOUNTER — Telehealth: Payer: No Typology Code available for payment source

## 2021-06-24 ENCOUNTER — Ambulatory Visit: Payer: Medicare Other | Admitting: Podiatry

## 2021-06-28 NOTE — Telephone Encounter (Signed)
Spoke with daughter Mateo Flow,  patient will only be gone 2 months with other daughter Lannette Donath. But, during that time, patient daughter Lannette Donath will continue to bring her here to this clinic and we will continue to monitor her. She just wanted to let us know.

## 2021-06-29 ENCOUNTER — Telehealth: Payer: Medicare Other

## 2021-06-29 ENCOUNTER — Telehealth: Payer: Self-pay

## 2021-06-29 NOTE — Telephone Encounter (Signed)
  Care Management   Follow Up Note   06/29/2021 Name: Lorin Rylander MRN: [id number] DOB: 04-Mar-1929   Referred by: [name], FNP Reason for referral : Chronic Care Management (Initial RN CM Outreach )   An unsuccessful telephone outreach was attempted today. The patient was referred to the case management team for assistance with care management and care coordination.   Follow Up Plan: Telephone follow up appointment with care management team member scheduled for: 07/21/21  Barb Merino, RN, BSN, CCM Care Management Coordinator [place] [facility]  Direct Phone: [phone]

## 2021-07-05 ENCOUNTER — Telehealth: Payer: Self-pay

## 2021-07-05 NOTE — Chronic Care Management (AMB) (Signed)
Chronic Care Management Pharmacy Assistant   Name: Jill Bowman  MRN: [id number] DOB: Jul 13, 1929   Reason for Encounter: Disease State/ Hypertension  Recent office visits:  06-06-2021 Daneen Schick (CCM)  Recent consult visits:  None  Hospital visits:  Medication Reconciliation was completed by comparing discharge summary, patient's EMR and Pharmacy list, and upon discussion with patient.  Admitted to the hospital on 05-26-2021 due to a fall. Discharge date was 05-26-2021. Discharged from [place]?Medications Started at [facility] Discharge:?? None  Medication Changes at Hospital Discharge: None  Medications Discontinued at Hospital Discharge: None  Medications that remain the same after Hospital Discharge:??  -All other medications will remain the same.    Medications: Outpatient Encounter Medications as of 07/05/2021  Medication Sig Note   acetaminophen (TYLENOL) 500 MG tablet Take 500 mg by mouth every 6 (six) hours as needed for mild pain.    acidophilus (RISAQUAD) CAPS capsule Take 1 capsule by mouth daily.    amiodarone (PACERONE) 100 MG tablet TAKE ONE TABLET BY MOUTH EVERY DAY ONCE DAILY IN THE MORNING]    amLODipine (NORVASC) 5 MG tablet TAKE ONE TABLET BY MOUTH EVERY DAY (Patient taking differently: Take 5 mg by mouth daily.)    carboxymethylcellulose (REFRESH PLUS) 0.5 % SOLN Place 1 drop into both eyes 3 (three) times daily as needed (dry eyes).    diclofenac sodium (VOLTAREN) 1 % GEL Apply 2 g topically 4 (four) times daily.    docusate sodium (COLACE) 100 MG capsule Take 200 mg by mouth at bedtime.    donepezil (ARICEPT) 10 MG tablet Take 1 tablet (10 mg total) by mouth at bedtime.    esomeprazole (NEXIUM) 20 MG capsule Take 1 capsule (20 mg total) by mouth daily.    gabapentin (NEURONTIN) 300 MG capsule Take 1 capsule (300 mg total) by mouth 2 (two) times daily.    lidocaine (XYLOCAINE) 5 % ointment Apply 1 application topically as  needed.    loratadine (CLARITIN) 10 MG tablet Take 10 mg by mouth daily as needed for allergies.     metoCLOPramide (REGLAN) 5 MG tablet Use before lunch and after dinner - 15 days, bid po.    metoprolol succinate (TOPROL-XL) 25 MG 24 hr tablet Take 1 tablet (25 mg total) by mouth daily. Take with or immediately following a meal.    nitroGLYCERIN (NITROSTAT) 0.4 MG SL tablet dissolve ONE UNDER THE TONGUE EVERY FIVE minutes FOR not more THAN THREE doses (Patient taking differently: Place 0.4 mg under the tongue every 5 (five) minutes as needed for chest pain.)    ondansetron (ZOFRAN) 4 MG tablet Take 1 tablet (4 mg total) by mouth every 8 (eight) hours as needed for up to 20 doses for nausea or vomiting. 12/02/2020: Picked up but never used   pravastatin (PRAVACHOL) 80 MG tablet Take 1 tablet (80 mg total) by mouth daily.    QUEtiapine (SEROQUEL) 50 MG tablet Take 1 tablet (50 mg total) by mouth at bedtime.    XARELTO 20 MG TABS tablet TAKE 1 TABLET BY MOUTH ONCE DAILY WITH SUPPER    No facility-administered encounter medications on file as of 07/05/2021.   Recent Relevant Labs: Lab Results  Component Value Date/Time   HGBA1C 5.5 04/20/2021 09:22 AM   HGBA1C 5.7 (H) 10/07/2020 04:06 PM   MICROALBUR 30 10/07/2020 04:07 PM   MICROALBUR 30 11/07/2018 12:28 PM    Kidney Function Lab Results  Component Value Date/Time   CREATININE  1.20 (H) 05/26/2021 05:17 PM   CREATININE 1.20 (H) 04/20/2021 09:22 AM   CREATININE 1.1 02/24/2016 09:44 AM   GFR 47.30 (L) 06/19/2011 11:40 AM   GFRNONAA 43 (L) 05/26/2021 05:17 PM   GFRAA 40 (L) 10/07/2020 04:06 PM    Reviewed chart prior to disease state call. Spoke with patient regarding BP  Recent Office Vitals: BP Readings from Last 3 Encounters:  05/26/21 (!) 106/50  04/20/21 (!) 147/79  04/20/21 (!) 142/80   Pulse Readings from Last 3 Encounters:  05/26/21 (!) 55  04/20/21 73  04/20/21 62    Wt Readings from Last 3 Encounters:  04/20/21 197 lb  (89.4 kg)  04/20/21 197 lb 12.8 oz (89.7 kg)  12/08/20 212 lb 2 oz (96.2 kg)     Kidney Function Lab Results  Component Value Date/Time   CREATININE 1.20 (H) 05/26/2021 05:17 PM   CREATININE 1.20 (H) 04/20/2021 09:22 AM   CREATININE 1.1 02/24/2016 09:44 AM   GFR 47.30 (L) 06/19/2011 11:40 AM   GFRNONAA 43 (L) 05/26/2021 05:17 PM   GFRAA 40 (L) 10/07/2020 04:06 PM    BMP Latest Ref Rng & Units 05/26/2021 04/20/2021 12/02/2020  Glucose 70 - 99 mg/dL 73 85 74  BUN 8 - 23 mg/dL '16 11 10  '$ Creatinine 0.44 - 1.00 mg/dL 1.20(H) 1.20(H) 1.15(H)  BUN/Creat Ratio 12 - 28 - 9(L) -  Sodium 135 - 145 mmol/L 139 142 140  Potassium 3.5 - 5.1 mmol/L 3.9 4.2 4.1  Chloride 98 - 111 mmol/L 107 104 107  CO2 22 - 32 mmol/L '25 24 24  '$ Calcium 8.9 - 10.3 mg/dL 8.7(L) 9.4 8.5(L)     07-05-2021: 1st attempt. Left VM 07-06-2021: 2nd attempt Left VM 07-12-2021: 3rd attempt left VM  Care Gaps: Yearly ophthalmology exam overdue Shingrix overdue PNA vac overdue Medicare wellness needs scheduling  Star Rating Drugs: Pravastatin 80 mg- Last filled 05-31-2021 90 DS [place] Pharmacist Assistant [phone]

## 2021-07-15 ENCOUNTER — Telehealth: Payer: Self-pay | Admitting: Cardiology

## 2021-07-15 NOTE — Telephone Encounter (Signed)
I believe they mean her pacemaker can you call her?

## 2021-07-15 NOTE — Telephone Encounter (Signed)
Called number on file, NA, [facility]

## 2021-07-15 NOTE — Telephone Encounter (Signed)
Pt called asking for a phone number in regards to a schedule for her monitor. Please call her at [phone].

## 2021-07-20 ENCOUNTER — Other Ambulatory Visit: Payer: Self-pay | Admitting: Nurse Practitioner

## 2021-07-20 MED ORDER — GABAPENTIN 300 MG PO CAPS: 300.0000 mg | ORAL_CAPSULE | Freq: Two times a day (BID) | ORAL | 1 refills | Status: DC

## 2021-07-21 ENCOUNTER — Telehealth: Payer: Medicare Other

## 2021-07-21 ENCOUNTER — Telehealth: Payer: Self-pay

## 2021-07-21 NOTE — Telephone Encounter (Signed)
  Care Management   Follow Up Note   07/21/2021 Name: Jill Bowman MRN: [id number] DOB: 1929-04-10   Referred by: [name], FNP Reason for referral : Chronic Care Management (Initial RN CM Outreach - 2nd attempt )   A second unsuccessful telephone outreach was attempted today. The patient was referred to the case management team for assistance with care management and care coordination.   Follow Up Plan: Telephone follow up appointment with care management team member scheduled for: 08/25/21  Barb Merino, RN, BSN, CCM Care Management Coordinator [place] [facility]  Direct Phone: [phone]

## 2021-07-22 ENCOUNTER — Other Ambulatory Visit: Payer: Self-pay

## 2021-07-22 DIAGNOSIS — I10 Essential (primary) hypertension: Secondary | ICD-10-CM

## 2021-07-22 MED ORDER — AMLODIPINE BESYLATE 5 MG PO TABS: 5.0000 mg | ORAL_TABLET | Freq: Every day | ORAL | 3 refills | Status: DC

## 2021-07-26 ENCOUNTER — Other Ambulatory Visit: Payer: Self-pay | Admitting: Cardiology

## 2021-07-26 DIAGNOSIS — I4892 Unspecified atrial flutter: Secondary | ICD-10-CM

## 2021-08-03 ENCOUNTER — Telehealth: Payer: Self-pay

## 2021-08-03 ENCOUNTER — Telehealth: Payer: Medicare Other

## 2021-08-03 NOTE — Telephone Encounter (Signed)
  Care Management   Follow Up Note   08/03/2021 Name: Jill Bowman MRN: [id number] DOB: 1929/11/07   Referred by: [name], FNP Reason for referral : Chronic Care Management (Unsuccessful call)   SW placed two unsuccessful outbound calls to the patients daughter, Jill Bowman, in response to voice messages received. SW left a HIPAA compliant voice message requesting a return call.  Follow Up Plan: The care management team will reach out to the patient again over the next 14 days.   Daneen Schick, BSW, CDP Social Worker, Certified Dementia Practitioner [place] [facility] [place] [facility] [phone]

## 2021-08-03 NOTE — Telephone Encounter (Signed)
  Care Management   Follow Up Note   08/03/2021 Name: Jill Bowman MRN: [id number] DOB: 10-29-1929   Referred by: [name], FNP Reason for referral : Chronic Care Management (Unsuccessful call)   An unsuccessful telephone outreach was attempted today. The patient was referred to the case management team for assistance with care management and care coordination. SW left a HIPAA compliant voice message requesting a return call.  Follow Up Plan: The care management team will reach out to the patient again over the next 30 days.   Daneen Schick, BSW, CDP Social Worker, Certified Dementia Practitioner [place] / [place] [facility] [phone]

## 2021-08-04 ENCOUNTER — Telehealth: Payer: Medicare Other

## 2021-08-09 ENCOUNTER — Encounter: Payer: Self-pay | Admitting: Cardiology

## 2021-08-09 ENCOUNTER — Ambulatory Visit: Payer: Medicare Other | Admitting: Cardiology

## 2021-08-09 ENCOUNTER — Other Ambulatory Visit: Payer: Self-pay

## 2021-08-09 VITALS — BP 145/76 | HR 80 | Temp 97.9°F | Resp 17 | Ht 67.0 in | Wt 193.6 lb

## 2021-08-09 DIAGNOSIS — I10 Essential (primary) hypertension: Secondary | ICD-10-CM

## 2021-08-09 DIAGNOSIS — I484 Atypical atrial flutter: Secondary | ICD-10-CM

## 2021-08-09 DIAGNOSIS — I495 Sick sinus syndrome: Secondary | ICD-10-CM

## 2021-08-09 DIAGNOSIS — Z950 Presence of cardiac pacemaker: Secondary | ICD-10-CM

## 2021-08-09 DIAGNOSIS — Z45018 Encounter for adjustment and management of other part of cardiac pacemaker: Secondary | ICD-10-CM
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
## 2021-08-11 ENCOUNTER — Ambulatory Visit (INDEPENDENT_AMBULATORY_CARE_PROVIDER_SITE_OTHER): Payer: Medicare Other

## 2021-08-11 DIAGNOSIS — F0280 Dementia in other diseases classified elsewhere without behavioral disturbance: Secondary | ICD-10-CM

## 2021-08-11 DIAGNOSIS — I10 Essential (primary) hypertension: Secondary | ICD-10-CM

## 2021-08-11 DIAGNOSIS — E782 Mixed hyperlipidemia: Secondary | ICD-10-CM

## 2021-08-11 NOTE — Patient Instructions (Signed)
Social Worker Visit Information  Goals we discussed today:   Goals Addressed             This Visit's Progress    Barriers to Treatment Identified and Managed   On track    Timeframe:  Long-Range Goal Priority:  Low Start Date:  6.13.22                                  Next planned outreach: 12.9.22  Patient Goals/Self-Care Activities  patient will: with the help of her daughter  -  Attend upcomming provider appointment -Contact SW as needed prior to next scheduled call         Patient verbalizes understanding of instructions provided today and agrees to view in [place].   Follow Up Plan: SW will follow up with patient by phone over the next 90 days  Daneen Schick, BSW, CDP Social Worker, Certified Dementia Practitioner [facility] [phone]

## 2021-08-11 NOTE — Chronic Care Management (AMB) (Signed)
Chronic Care Management    Social Work Note  08/11/2021 Name: Jill Bowman MRN: [id number] DOB: Jan 03, 1929  Jill Bowman is a 87 y.o. year old female who is a primary care patient of [name], [place]. The CCM team was consulted to assist the patient with chronic disease management and/or care coordination needs related to:  Mixed Hyperlipidemia, Essential HTN, and Alzheimers .   Engaged with patients daughter Jill Bowman  for follow up visit in response to provider referral for social work chronic care management and care coordination services.   Consent to Services:  The patient was given information about Chronic Care Management services, agreed to services, and gave verbal consent prior to initiation of services.  Please see initial visit note for detailed documentation.   Patient agreed to services and consent obtained.   Assessment: Review of patient past medical history, allergies, medications, and health status, including review of relevant consultants reports was performed today as part of a comprehensive evaluation and provision of chronic care management and care coordination services.     SDOH (Social Determinants of Health) assessments and interventions performed:    Advanced Directives Status: Not addressed in this encounter.  CCM Care Plan  Allergies  Allergen Reactions   Codeine Itching, Rash and Other (See Comments)    Full body rash    Hydroxyzine Other (See Comments)    Extreme confusion and hallucinations   Lorazepam Other (See Comments)    Extreme confusion, hallucinations and hyperactivity   Penicillins Anaphylaxis, Hives and Shortness Of Breath    Tolerated cefazolin and ceftriaxone in 2013   Sulfa Antibiotics Shortness Of Breath   Zinc Itching   Ciprofloxacin Other (See Comments)    unknown   Ciprofloxacin Hives and Other (See Comments)    "think I break out in welts"    Latex Rash and Other (See Comments)    Tears skin    Penicillins  Other (See Comments)    Unknown    Sulfa Antibiotics Other (See Comments)    unknown    Outpatient Encounter Medications as of 08/11/2021  Medication Sig   acetaminophen (TYLENOL) 500 MG tablet Take 500 mg by mouth every 6 (six) hours as needed for mild pain.   acidophilus (RISAQUAD) CAPS capsule Take 1 capsule by mouth daily.   amiodarone (PACERONE) 100 MG tablet TAKE ONE TABLET BY MOUTH EVERY DAY ONCE DAILY IN THE MORNING]   amLODipine (NORVASC) 5 MG tablet Take 1 tablet (5 mg total) by mouth daily.   carboxymethylcellulose (REFRESH PLUS) 0.5 % SOLN Place 1 drop into both eyes 3 (three) times daily as needed (dry eyes).   diclofenac sodium (VOLTAREN) 1 % GEL Apply 2 g topically 4 (four) times daily.   docusate sodium (COLACE) 100 MG capsule Take 200 mg by mouth at bedtime.   donepezil (ARICEPT) 10 MG tablet Take 1 tablet (10 mg total) by mouth at bedtime.   gabapentin (NEURONTIN) 300 MG capsule Take 1 capsule (300 mg total) by mouth 2 (two) times daily.   lidocaine (XYLOCAINE) 5 % ointment Apply 1 application topically as needed.   loratadine (CLARITIN) 10 MG tablet Take 10 mg by mouth daily as needed for allergies.    metoprolol succinate (TOPROL-XL) 25 MG 24 hr tablet Take 1 tablet (25 mg total) by mouth daily. Take with or immediately following a meal.   nitroGLYCERIN (NITROSTAT) 0.4 MG SL tablet dissolve ONE UNDER THE TONGUE EVERY FIVE minutes FOR not more THAN THREE doses (Patient taking differently: Place  0.4 mg under the tongue every 5 (five) minutes as needed for chest pain.)   pravastatin (PRAVACHOL) 80 MG tablet Take 1 tablet (80 mg total) by mouth daily.   QUEtiapine (SEROQUEL) 50 MG tablet Take 1 tablet (50 mg total) by mouth at bedtime.   XARELTO 20 MG TABS tablet TAKE 1 TABLET BY MOUTH ONCE DAILY WITH SUPPER   No facility-administered encounter medications on file as of 08/11/2021.    Patient Active Problem List   Diagnosis Date Noted   Abnormal glucose 05/25/2020   Left  knee pain 05/06/2020   Type 2 diabetes mellitus ([place]) 04/26/2020   CKD (chronic kidney disease), stage III ([place]) 04/26/2020   SBO (small bowel obstruction) ([place]) 04/26/2020   Transient ischemic attack (TIA) 04/12/2020   Progressive dementia with uncertain etiology ([place]) 04/12/2020   Dementia due to medical condition with behavioral disturbance ([place]) 04/12/2020   Paranoid reaction ([place]) 04/12/2020   Encounter for care of pacemaker 09/12/2019   Stented coronary artery 11/07/2018   Pseudoseizure    Late onset Alzheimer's disease without behavioral disturbance ([place])    GERD (gastroesophageal reflux disease) 01/17/2018   Depression 01/17/2018   Atrial tachycardia ([place]) 01/17/2018   HLD (hyperlipidemia) 01/17/2018   Breast cancer of lower-outer quadrant of right female breast ([place]) 01/03/2016   Open wound of abdominal wall without complication [id number]   Cellulitis of abdominal wall 03/24/2015   Elevated alkaline phosphatase level 12/28/2014   Abdominal wall abscess 10/19/2014   Bradycardia 10/19/2014   Physical deconditioning 09/30/2012   Soft tissue infection, L leg 09/11/2012   CAD (coronary artery disease) 07/16/2012   Pacemaker  Dual chamber Medtronic Adapta L [id number]  07/16/2012   Morbid obesity ([place]) 07/16/2012   Degloving injury of lower leg, Left 07/04/2012   Tachycardia-bradycardia syndrome ([place])     Conditions to be addressed/monitored: HTN, Dementia, and Mixed Hyperlipidemia  Care Plan : Social Work [place]  Updates made by Daneen Schick since 08/11/2021 12:00 AM     Problem: Barriers to Treatment      Long-Range Goal: Barriers to Treatment Identified and Managed   Start Date: 05/09/2021  This Visit's Progress: On track  Recent Progress: On track  Priority: Low  Note:   Current Barriers:  Chronic disease management support and education needs related to  Alzheimer's Disease, HTN, Mixed Hyperlipidemia, and Abnormal Glucose   Transportation Financial strain  related to the cost of incontinent supplies  Social Worker Clinical Goal(s):  patient will work with SW to identify and address any acute and/or chronic care coordination needs related to the self health management of  Alzheimer's Disease, HTN, Mixed Hyperlipidemia, and Abnormal Glucose   Patient will work with SW to become more knowledgeable of transportation resources Patient will work with SW to identify possible resources to assist with incontinent products Patient will work with SW to become more knowledgeable of caregiver resources  SW Interventions:  Inter-disciplinary care team collaboration (see longitudinal plan of care) Collaboration with [name], [place] regarding development and update of comprehensive plan of care as evidenced by provider attestation and co-signature Successful outbound call placed to the patients daughter, Naairah Crowe to assess for care coordination needs Determined the patient is doing well with no acute needs at this time Discussed the patient will begin staying with her other daughter in October - family will update contact information as needed Patient has [place] appointment with primary provider 9/27 - daughter is knowledgeable of appointment and plans to accompany her mother to  the appointment Scheduled follow up call over the next 90 days   Patient Goals/Self-Care Activities patient will: with the help of her daughter  -  Attend upcomming provider appointment -Contact SW as needed prior to next scheduled call  Follow Up Plan: The care management team will reach out to the patient again over the next 90 days.        Follow Up Plan: SW will follow up with patient by phone over the next 90 days      Daneen Schick, BSW, CDP Social Worker, Certified Dementia Practitioner [place] [facility] [phone]

## 2021-08-23 ENCOUNTER — Other Ambulatory Visit: Payer: Self-pay

## 2021-08-23 ENCOUNTER — Ambulatory Visit (INDEPENDENT_AMBULATORY_CARE_PROVIDER_SITE_OTHER): Payer: Medicare Other | Admitting: Nurse Practitioner

## 2021-08-23 ENCOUNTER — Encounter: Payer: Self-pay | Admitting: Nurse Practitioner

## 2021-08-23 VITALS — BP 130/88 | HR 63 | Temp 98.1°F | Ht 67.0 in | Wt 190.6 lb

## 2021-08-23 DIAGNOSIS — R7309 Other abnormal glucose: Secondary | ICD-10-CM

## 2021-08-23 DIAGNOSIS — Z23 Encounter for immunization: Secondary | ICD-10-CM | POA: Diagnosis not present

## 2021-08-23 DIAGNOSIS — N183 Chronic kidney disease, stage 3 unspecified: Secondary | ICD-10-CM

## 2021-08-23 DIAGNOSIS — N1831 Chronic kidney disease, stage 3a: Secondary | ICD-10-CM

## 2021-08-23 DIAGNOSIS — I129 Hypertensive chronic kidney disease with stage 1 through stage 4 chronic kidney disease, or unspecified chronic kidney disease: Secondary | ICD-10-CM | POA: Diagnosis not present

## 2021-08-23 DIAGNOSIS — E782 Mixed hyperlipidemia: Secondary | ICD-10-CM
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
## 2021-08-23 NOTE — Patient Instructions (Signed)

## 2021-08-24 ENCOUNTER — Encounter: Payer: Self-pay | Admitting: Nurse Practitioner

## 2021-08-24 LAB — BMP8+EGFR
BUN/Creatinine Ratio: 11 — ABNORMAL LOW (ref 12–28)
BUN: 14 mg/dL (ref 10–36)
CO2: 23 mmol/L (ref 20–29)
Calcium: 8.9 mg/dL (ref 8.7–10.3)
Chloride: 106 mmol/L (ref 96–106)
Creatinine, Ser: 1.29 mg/dL — ABNORMAL HIGH (ref 0.57–1.00)
Glucose: 81 mg/dL (ref 70–99)
Potassium: 4.2 mmol/L (ref 3.5–5.2)
Sodium: 144 mmol/L (ref 134–144)
eGFR: 39 mL/min/{1.73_m2} — ABNORMAL LOW (ref >59)

## 2021-08-24 LAB — CBC
Hematocrit: 35.9 % (ref 34.0–46.6)
Hemoglobin: 12.2 g/dL (ref 11.1–15.9)
MCH: 32.1 pg (ref 26.6–33.0)
MCHC: 34 g/dL (ref 31.5–35.7)
MCV: 95 fL (ref 79–97)
Platelets: 232 10*3/uL (ref 150–450)
RBC: 3.8 x10E6/uL (ref 3.77–5.28)
RDW: 13.2 % (ref 11.7–15.4)
WBC: 6.7 10*3/uL (ref 3.4–10.8)

## 2021-08-25 ENCOUNTER — Ambulatory Visit: Payer: Self-pay

## 2021-08-25 ENCOUNTER — Telehealth: Payer: Medicare Other

## 2021-08-25 DIAGNOSIS — E782 Mixed hyperlipidemia: Secondary | ICD-10-CM

## 2021-08-25 DIAGNOSIS — I10 Essential (primary) hypertension: Secondary | ICD-10-CM

## 2021-08-25 DIAGNOSIS — R7309 Other abnormal glucose: Secondary | ICD-10-CM

## 2021-08-25 DIAGNOSIS — F0280 Dementia in other diseases classified elsewhere without behavioral disturbance: Secondary | ICD-10-CM

## 2021-08-25 DIAGNOSIS — N1832 Chronic kidney disease, stage 3b: Secondary | ICD-10-CM

## 2021-08-25 NOTE — Patient Instructions (Signed)
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 baseline weight should be 186 lbs Determined patient is established with [facility] with next appointment scheduled for 10/24/21 _0   Mailed printed educational materials related to Nutrition and Dementia  Discussed plans with patient for ongoing care management follow up and provided patient with direct contact information for care management team Self Care Activities:  Attends all scheduled provider appointments Calls pharmacy for medication refills Calls provider office for new concerns or questions Patient Goals: Engage in cognitive activities (such as music, reading or writing). Engage in regular physical exercise. Implement the Alzheimer's diet (Mediterranean diet, MIND diet or the FINGER diet). Maintain a good sleep pattern. Reduce stress. Stay socially engaged.  Follow Up Plan: Telephone follow up appointment with care management team member scheduled for:  12/29/21
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
## 2021-08-26 DIAGNOSIS — G301 Alzheimer's disease with late onset: Secondary | ICD-10-CM | POA: Diagnosis not present

## 2021-08-26 DIAGNOSIS — I10 Essential (primary) hypertension: Secondary | ICD-10-CM | POA: Diagnosis not present

## 2021-08-26 DIAGNOSIS — F0280 Dementia in other diseases classified elsewhere without behavioral disturbance: Secondary | ICD-10-CM

## 2021-08-26 DIAGNOSIS — E782 Mixed hyperlipidemia: Secondary | ICD-10-CM

## 2021-09-06 ENCOUNTER — Telehealth: Payer: Self-pay

## 2021-09-06 NOTE — Chronic Care Management (AMB) (Signed)
Chronic Care Management Pharmacy Assistant   Name: Jill Bowman  MRN: [id number] DOB: 09-08-1929   Reason for Encounter: Disease State/ hypertension  Recent office visits:  05-09-2021 Daneen Schick (CCM)  05-09-2021 Little, Claudette Stapler, RN (CCM)  06-06-2021 Daneen Schick (CCM)  08-11-2021 Daneen Schick (CCM)  08-23-2021 [name], [place]. Flu vaccine given. Creatinine= 1.29, eGFR= 39, BUN/Creatinine= 11.  08-25-2021 Little, Claudette Stapler, RN (CCM)  Recent consult visits:  08-09-2021 Adrian Prows, MD (Cardiology). STOP nexium, reglan and zofran.  Hospital visits:  Medication Reconciliation was completed by comparing discharge summary, patient's EMR and Pharmacy list, and upon discussion with patient.  Admitted to the hospital on 05-26-2021 due to a fall. Discharge date was 05-26-2021. Discharged from [facility]?Medications Started at [facility] Discharge:?? None  Medication Changes at Hospital Discharge: None  Medications Discontinued at Hospital Discharge: None  Medications that remain the same after Hospital Discharge:??  -All other medications will remain the same.    Medications: Outpatient Encounter Medications as of 09/06/2021  Medication Sig   acetaminophen (TYLENOL) 500 MG tablet Take 500 mg by mouth every 6 (six) hours as needed for mild pain.   acidophilus (RISAQUAD) CAPS capsule Take 1 capsule by mouth daily. (Patient not taking: Reported on 08/23/2021)   amiodarone (PACERONE) 100 MG tablet TAKE ONE TABLET BY MOUTH EVERY DAY ONCE DAILY IN THE MORNING]   amLODipine (NORVASC) 5 MG tablet Take 1 tablet (5 mg total) by mouth daily.   carboxymethylcellulose (REFRESH PLUS) 0.5 % SOLN Place 1 drop into both eyes 3 (three) times daily as needed (dry eyes). (Patient not taking: Reported on 08/23/2021)   diclofenac sodium (VOLTAREN) 1 % GEL Apply 2 g topically 4 (four) times daily.   docusate sodium (COLACE) 100 MG capsule Take 200 mg by mouth at bedtime.    donepezil (ARICEPT) 10 MG tablet Take 1 tablet (10 mg total) by mouth at bedtime.   gabapentin (NEURONTIN) 300 MG capsule Take 1 capsule (300 mg total) by mouth 2 (two) times daily.   lidocaine (XYLOCAINE) 5 % ointment Apply 1 application topically as needed. (Patient not taking: Reported on 08/23/2021)   loratadine (CLARITIN) 10 MG tablet Take 10 mg by mouth daily as needed for allergies.    metoprolol succinate (TOPROL-XL) 25 MG 24 hr tablet Take 1 tablet (25 mg total) by mouth daily. Take with or immediately following a meal.   nitroGLYCERIN (NITROSTAT) 0.4 MG SL tablet dissolve ONE UNDER THE TONGUE EVERY FIVE minutes FOR not more THAN THREE doses (Patient not taking: Reported on 08/23/2021)   pravastatin (PRAVACHOL) 80 MG tablet Take 1 tablet (80 mg total) by mouth daily.   QUEtiapine (SEROQUEL) 50 MG tablet Take 1 tablet (50 mg total) by mouth at bedtime.   XARELTO 20 MG TABS tablet TAKE 1 TABLET BY MOUTH ONCE DAILY WITH SUPPER   No facility-administered encounter medications on file as of 09/06/2021.   Reviewed chart prior to disease state call. Spoke with patient regarding BP  Recent Office Vitals: BP Readings from Last 3 Encounters:  08/23/21 130/88  08/09/21 (!) 145/76  05/26/21 (!) 106/50   Pulse Readings from Last 3 Encounters:  08/23/21 63  08/09/21 80  05/26/21 (!) 55    Wt Readings from Last 3 Encounters:  08/23/21 190 lb 9.6 oz (86.5 kg)  08/09/21 193 lb 9.6 oz (87.8 kg)  04/20/21 197 lb (89.4 kg)     Kidney Function Lab Results  Component Value Date/Time  CREATININE 1.29 (H) 08/23/2021 12:54 PM   CREATININE 1.20 (H) 05/26/2021 05:17 PM   CREATININE 1.1 02/24/2016 09:44 AM   GFR 47.30 (L) 06/19/2011 11:40 AM   GFRNONAA 43 (L) 05/26/2021 05:17 PM   GFRAA 40 (L) 10/07/2020 04:06 PM    BMP Latest Ref Rng & Units 08/23/2021 05/26/2021 04/20/2021  Glucose 70 - 99 mg/dL 81 73 85  BUN 10 - 36 mg/dL $Remove'14 16 11  'svUwidn$ Creatinine 0.57 - 1.00 mg/dL 1.29(H) 1.20(H) 1.20(H)   BUN/Creat Ratio 12 - 28 11(L) - 9(L)  Sodium 134 - 144 mmol/L 144 139 142  Potassium 3.5 - 5.2 mmol/L 4.2 3.9 4.2  Chloride 96 - 106 mmol/L 106 107 104  CO2 20 - 29 mmol/L $RemoveB'23 25 24  'RgGmOqKt$ Calcium 8.7 - 10.3 mg/dL 8.9 8.7(L) 9.4    Current antihypertensive regimen:  Amlodipine 5 mg daily Metoprolol 25 mg daily  How often are you checking your Blood Pressure? daily  Current home BP readings: 145/76  What recent interventions/DTPs have been made by any provider to improve Blood Pressure control since last CPP Visit:  Educated on Daily salt intake goal < 2300 mg; Importance of home blood pressure monitoring; Proper BP monitoring technique; Counseled to monitor BP at home at least four times per week, document, and provide log at future appointments  Any recent hospitalizations or ED visits since last visit with CPP? Yes  What diet changes have been made to improve Blood Pressure Control?  Patient's daughter states she has a good appetite, eats plenty of fruits/vegetables and drinks plenty of water.  What exercise is being done to improve your Blood Pressure Control?  Patient's daughter states she walks outside and in the house daily, patient does her ADL's daily with limited assist.  Adherence Review: Is the patient currently on ACE/ARB medication? No Does the patient have >5 day gap between last estimated fill dates? No  09-06-2021: 1st attempt left VM. Patient's daughter priscilla called back and stated patient is living with her other daughter Suanne Marker and to contact her at [phone]. Left daughter voicemail.  NOTES: Patient's daughter states she is making a loud noise as soon as she wakes up with her breathing. Patient's daughter stated patient isn't breathing through her nose like normal on her own so she has to instruct her to breath through her nose. Patients's daughter states it's getting worse and she feels some type of therapy may help. Overall patient's blood pressure has  been normal. Sent [name] FNP a message with concerns. Patient is scheduled for a telephone follow up a with Orlando Penner CPP in November.  Care Gaps: Yearly Ophthalmology exam overdue Shingrix overdue Covid booster overdue last completed 03-31-2021  Star Rating Drugs: Pravastatin 80 mg- Last filled 05-31-2021 90 [facility] [place] (patient's daughter states medicaiton was just picked up)  [place] Pharmacist Assistant [phone]

## 2021-09-21 ENCOUNTER — Telehealth: Payer: Self-pay | Admitting: Nurse Practitioner

## 2021-09-21 NOTE — Telephone Encounter (Signed)
Left message for patient to call back and schedule Medicare Annual Wellness Visit (AWV) either virtually or in office.  Left both my jabber number [phone] and office number    Last AWV 10/07/20 ; please schedule at anytime with [facility]    This should be a 45 minute visit.

## 2021-09-30 ENCOUNTER — Telehealth: Payer: Self-pay | Admitting: *Deleted

## 2021-09-30 NOTE — Chronic Care Management (AMB) (Signed)
  Care Management   Note  09/30/2021 Name: [name] MRN: [id number] DOB: 1929/09/08  Jill Bowman is a 87 y.o. year old female who is a primary care patient of [name], [place] and is actively engaged with the care management team. I reached out to [facility] by phone today to assist with re-scheduling a follow up visit with the Pharmacist  Follow up plan: Unsuccessful telephone outreach attempt made. A HIPAA compliant phone message was left for the patient providing contact information and requesting a return call.  The care management team will reach out to the patient again over the next 7 days.  If patient returns call to provider office, please advise to call [place] at [phone].  [place] Management  Direct Dial: [phone]

## 2021-10-03 ENCOUNTER — Other Ambulatory Visit: Payer: Self-pay | Admitting: Cardiology

## 2021-10-03 DIAGNOSIS — I10 Essential (primary) hypertension: Secondary | ICD-10-CM

## 2021-10-05 ENCOUNTER — Telehealth: Payer: Self-pay

## 2021-10-05 NOTE — Chronic Care Management (AMB) (Signed)
    Called [place] daughter Suanne Marker [phone]) No answer, left message of appointment on 10-06-2021 at 12:15 via telephone visit with Orlando Penner, Pharm D. Notified to have all medications, supplements, blood pressure and/or blood sugar logs available during appointment and to return call if need to reschedule.   Care Gaps: PNA Vac overdue Yearly ophthalmology overdue Shingrix overdue Covid booster overdue  Star Rating Drug: Pravastatin 80 mg- Last filled 09-19-2021 90 DS [place]  Any gaps in medications fill history? No  [place] Pharmacist Assistant [phone]

## 2021-10-06 ENCOUNTER — Ambulatory Visit (INDEPENDENT_AMBULATORY_CARE_PROVIDER_SITE_OTHER): Payer: Medicare Other

## 2021-10-06 DIAGNOSIS — I10 Essential (primary) hypertension: Secondary | ICD-10-CM

## 2021-10-06 DIAGNOSIS — E782 Mixed hyperlipidemia: Secondary | ICD-10-CM

## 2021-10-06 NOTE — Progress Notes (Signed)
Chronic Care Management Pharmacy Note  10/12/2021 Name:  Jill Bowman MRN:  [id number] DOB:  1929-06-04  Summary: Patient is currently at the other daughters house.   Recommendations/Changes made from today's visit: Recommend patient continue current medication regimen.   Plan: Patient to continue current medications    Subjective: Jill Bowman is an 87 y.o. year old female who is a primary patient of [name], [place].  The CCM team was consulted for assistance with disease management and care coordination needs.    Engaged with patient by telephone for initial visit in response to provider referral for pharmacy case management and/or care coordination services. Daughter reports that she is not sleeping at night and resting as good. She is worried about the medications and what to help her with pain. Her daughter reports that she is making a noise that is worrisome  Consent to Services:  The patient was given information about Chronic Care Management services, agreed to services, and gave verbal consent prior to initiation of services.  Please see initial visit note for detailed documentation.   Patient Care Team: [name], FNP as PCP - General (General Practice) Rex Kras, Claudette Stapler, RN as [place] Management [place], Tillie Rung as [place], Sharyn Blitz, [facility] (Pharmacist)  Recent office visits: 08/23/2021 PCP OV   Recent consult visits: 08/09/2021 [facility] visits: None in previous 6 months   Objective:  Lab Results  Component Value Date   CREATININE 1.29 (H) 08/23/2021   BUN 14 08/23/2021   GFR 47.30 (L) 06/19/2011   GFRNONAA 43 (L) 05/26/2021   GFRAA 40 (L) 10/07/2020   NA 144 08/23/2021   K 4.2 08/23/2021   CALCIUM 8.9 08/23/2021   CO2 23 08/23/2021   GLUCOSE 81 08/23/2021    Lab Results  Component Value Date/Time   HGBA1C 5.5 04/20/2021 09:22 AM   HGBA1C 5.7 (H) 10/07/2020  04:06 PM   GFR 47.30 (L) 06/19/2011 11:40 AM   MICROALBUR 30 10/07/2020 04:07 PM   MICROALBUR 30 11/07/2018 12:28 PM    Last diabetic Eye exam: No results found for: HMDIABEYEEXA  Last diabetic Foot exam: No results found for: HMDIABFOOTEX   Lab Results  Component Value Date   CHOL 131 04/20/2021   HDL 49 04/20/2021   LDLCALC 60 04/20/2021   TRIG 123 04/20/2021   CHOLHDL 2.7 04/20/2021    Hepatic Function Latest Ref Rng & Units 04/20/2021 11/24/2020 10/07/2020  Total Protein 6.0 - 8.5 g/dL 6.9 6.7 7.0  Albumin 3.6 - 4.6 g/dL 4.2 3.2(L) 4.0  AST 0 - 40 IU/L _0 ALT 0 - 32 IU/L _1 Alk Phosphatase 44 - 121 IU/L 130(H) 93 123(H)  Total Bilirubin 0.0 - 1.2 mg/dL 0.3 0.4 0.3  Bilirubin, Direct 0.0 - 0.2 mg/dL - <0.1 -    Lab Results  Component Value Date/Time   TSH 2.990 02/26/2020 09:43 AM   TSH 2.110 10/02/2019 12:09 PM   FREET4 1.16 02/26/2020 09:43 AM    CBC Latest Ref Rng & Units 08/23/2021 05/26/2021 04/20/2021  WBC 3.4 - 10.8 x10E3/uL 6.7 6.2 6.9  Hemoglobin 11.1 - 15.9 g/dL 12.2 11.1(L) 12.4  Hematocrit 34.0 - 46.6 % 35.9 34.6(L) 37.6  Platelets 150 - 450 x10E3/uL 232 202 235    Lab Results  Component Value Date/Time   VD25OH 54.3 04/20/2021 09:22 AM   VD25OH 54.7 10/07/2020 04:06 PM    Clinical ASCVD: No  The  ASCVD Risk score (Arnett DK, et al., 2019) failed to calculate for the following reasons:   The 2019 ASCVD risk score is only valid for ages 10 to 18    Depression screen PHQ 2/9 10/07/2020 10/07/2020 10/02/2019  Decreased Interest 0 0 0  Down, Depressed, Hopeless 0 0 0  PHQ - 2 Score 0 0 0  Altered sleeping - - 0  Tired, decreased energy - - 0  Change in appetite - - 0  Feeling bad or failure about yourself  - - 0  Trouble concentrating - - 0  Moving slowly or fidgety/restless - - 0  Suicidal thoughts - - 0  PHQ-9 Score - - 0  Difficult doing work/chores - - Not difficult at all    Social History   Tobacco Use  Smoking Status  Former   Packs/day: 0.50   Years: 5.00   Pack years: 2.50   Types: Cigarettes   Quit date: 11/27/1950   Years since quitting: 70.9  Smokeless Tobacco Never   BP Readings from Last 3 Encounters:  08/23/21 130/88  08/09/21 (!) 145/76  05/26/21 (!) 106/50   Pulse Readings from Last 3 Encounters:  08/23/21 63  08/09/21 80  05/26/21 (!) 55   Wt Readings from Last 3 Encounters:  08/23/21 190 lb 9.6 oz (86.5 kg)  08/09/21 193 lb 9.6 oz (87.8 kg)  04/20/21 197 lb (89.4 kg)   BMI Readings from Last 3 Encounters:  08/23/21 29.85 kg/m  08/09/21 30.32 kg/m  04/20/21 30.85 kg/m    Assessment/Interventions: Review of patient past medical history, allergies, medications, health status, including review of consultants reports, laboratory and other test data, was performed as part of comprehensive evaluation and provision of chronic care management services.   SDOH:  (Social Determinants of Health) assessments and interventions performed: No  SDOH Screenings   Alcohol Screen: Not on file  Depression (PHQ2-9): Not on file  Financial Resource Strain: Not on file  Food Insecurity: No Food Insecurity   Worried About Running Out of Food in the Last Year: Never true   Ran Out of Food in the Last Year: Never true  Housing: Low Risk    Last Housing Risk Score: 0  Physical Activity: Not on file  Social Connections: Not on file  Stress: Not on file  Tobacco Use: Medium Risk   Smoking Tobacco Use: Former   Smokeless Tobacco Use: Never   Passive Exposure: Not on file  Transportation Needs: No Transportation Needs   Lack of Transportation (Medical): No   Lack of Transportation (Non-Medical): No    CCM Care Plan  Allergies  Allergen Reactions   Codeine Itching, Rash and Other (See Comments)    Full body rash    Hydroxyzine Other (See Comments)    Extreme confusion and hallucinations   Lorazepam Other (See Comments)    Extreme confusion, hallucinations and hyperactivity    Penicillins Anaphylaxis, Hives and Shortness Of Breath    Tolerated cefazolin and ceftriaxone in 2013   Sulfa Antibiotics Shortness Of Breath   Zinc Itching   Ciprofloxacin Other (See Comments)    unknown   Ciprofloxacin Hives and Other (See Comments)    "think I break out in welts"    Latex Rash and Other (See Comments)    Tears skin    Penicillins Other (See Comments)    Unknown    Sulfa Antibiotics Other (See Comments)    unknown    Medications Reviewed Today     Reviewed by  Little, Claudette Stapler, RN (Registered Nurse) on 08/25/21 at 1303  Med List Status: <None>   Medication Order Taking? Sig Documenting Provider Last Dose Status Informant  acetaminophen (TYLENOL) 500 MG tablet [id number] No Take 500 mg by mouth every 6 (six) hours as needed for mild pain. [provider] Taking Active Child  acidophilus (RISAQUAD) CAPS capsule [id number] No Take 1 capsule by mouth daily.  Patient not taking: Reported on 08/23/2021   Nita Sells, MD Not Taking Active Child  amiodarone (PACERONE) 100 MG tablet [id number] No TAKE ONE TABLET BY MOUTH EVERY DAY ONCE DAILY IN THE MORNING] Adrian Prows, MD Taking Active   amLODipine (NORVASC) 5 MG tablet [id number] No Take 1 tablet (5 mg total) by mouth daily. Adrian Prows, MD Taking Active   carboxymethylcellulose (REFRESH PLUS) 0.5 % SOLN [id number] No Place 1 drop into both eyes 3 (three) times daily as needed (dry eyes).  Patient not taking: Reported on 08/23/2021   [provider] Not Taking Active Child  diclofenac sodium (VOLTAREN) 1 % GEL [id number] No Apply 2 g topically 4 (four) times daily. Mcarthur Rossetti, MD Taking Active Child  docusate sodium (COLACE) 100 MG capsule [id number] No Take 200 mg by mouth at bedtime. [provider] Taking Active Child  donepezil (ARICEPT) 10 MG tablet [id number] No Take 1 tablet (10 mg total) by mouth at bedtime. Ward Givens, NP Taking Active   gabapentin (NEURONTIN) 300 MG  capsule [id number] No Take 1 capsule (300 mg total) by mouth 2 (two) times daily. [name], FNP Taking Active   lidocaine (XYLOCAINE) 5 % ointment [id number] No Apply 1 application topically as needed.  Patient not taking: Reported on 08/23/2021   [name], FNP Not Taking Active   loratadine (CLARITIN) 10 MG tablet [id number] No Take 10 mg by mouth daily as needed for allergies.  [provider] Taking Active Child  metoprolol succinate (TOPROL-XL) 25 MG 24 hr tablet [id number] No Take 1 tablet (25 mg total) by mouth daily. Take with or immediately following a meal. Adrian Prows, MD Taking Expired 07/26/21 2359   nitroGLYCERIN (NITROSTAT) 0.4 MG SL tablet [id number] No dissolve ONE UNDER THE TONGUE EVERY FIVE minutes FOR not more THAN THREE doses  Patient not taking: Reported on 08/23/2021   Adrian Prows, MD Not Taking Active   pravastatin (PRAVACHOL) 80 MG tablet [id number] No Take 1 tablet (80 mg total) by mouth daily. Adrian Prows, MD Taking Active   QUEtiapine (SEROQUEL) 50 MG tablet [id number] No Take 1 tablet (50 mg total) by mouth at bedtime. Ward Givens, NP Taking Active   XARELTO 20 MG TABS tablet [id number] No TAKE 1 TABLET BY MOUTH ONCE DAILY WITH SUPPER Adrian Prows, MD Taking Active             Patient Active Problem List   Diagnosis Date Noted   Abnormal glucose 05/25/2020   Left knee pain 05/06/2020   Type 2 diabetes mellitus ([place]) 04/26/2020   CKD (chronic kidney disease), stage III ([place]) 04/26/2020   SBO (small bowel obstruction) ([place]) 04/26/2020   Transient ischemic attack (TIA) 04/12/2020   Progressive dementia with uncertain etiology ([place]) 04/12/2020   Dementia due to medical condition with behavioral disturbance 04/12/2020   Paranoid reaction ([place]) 04/12/2020   Encounter for care of pacemaker 09/12/2019   Stented coronary artery 11/07/2018   Pseudoseizure    Late onset Alzheimer's disease without behavioral disturbance ([place])    GERD (gastroesophageal reflux  disease) 01/17/2018   Depression 01/17/2018  Atrial tachycardia ([place]) 01/17/2018   HLD (hyperlipidemia) 01/17/2018   Breast cancer of lower-outer quadrant of right female breast ([place]) 01/03/2016   Open wound of abdominal wall without complication 26/20/3559   Cellulitis of abdominal wall 03/24/2015   Elevated alkaline phosphatase level 12/28/2014   Abdominal wall abscess 10/19/2014   Bradycardia 10/19/2014   Physical deconditioning 09/30/2012   Soft tissue infection, L leg 09/11/2012   CAD (coronary artery disease) 07/16/2012   Pacemaker  Dual chamber Medtronic Adapta L ADDRL1  07/16/2012   Morbid obesity ([place]) 07/16/2012   Degloving injury of lower leg, Left 07/04/2012   Tachycardia-bradycardia syndrome ([place])     Immunization History  Administered Date(s) Administered   Fluad Quad(high Dose 65+) 09/07/2020, 08/23/2021   Influenza, High Dose Seasonal PF 08/04/2015, 08/05/2019   Influenza-Unspecified 07/28/2018   Moderna Sars-Covid-2 Vaccination 02/21/2020, 03/20/2020, 09/23/2020, 03/31/2021   Tdap 08/07/2018    Conditions to be addressed/monitored:  Hypertension and Hyperlipidemia  Care Plan : [place]  Updates made by Mayford Knife, [place] since 10/12/2021 12:00 AM     Problem: HTN, HLD      Long-Range Goal: Disease Management   Recent Progress: On track  Note:   Current Barriers:  Unable to independently monitor therapeutic efficacy  Pharmacist Clinical Goal(s):  Patient will achieve adherence to monitoring guidelines and medication adherence to achieve therapeutic efficacy through collaboration with PharmD and provider.   Interventions: 1:1 collaboration with [name], FNP regarding development and update of comprehensive plan of care as evidenced by provider attestation and co-signature Inter-disciplinary care team collaboration (see longitudinal plan of care) Comprehensive medication review performed; medication list updated in electronic  medical record  Hypertension (BP goal <140/90) -Controlled -Current treatment: Amlodipine 5 mg tablet once per day Metoprolol Succinate 25 mg tablet once per day  -Current home readings: 130/70, 130/88, 130/75 -Current exercise habits: she exercises and walks around with her daughter, they walk for at least 45 minutes twice per day  -Denies hypotensive/hypertensive symptoms -Educated on Daily salt intake goal < 2300 mg; -Counseled to monitor BP at home at least four times per day, document, and provide log at future appointments -Recommended to continue current medication  Hyperlipidemia: (LDL goal < 70) -Controlled -Current treatment: Pravastatin 80 mg tablet once per day   -Current exercise habits: she exercises and walks around with her daughter, they walk for at least 45 minutes twice per day  -Educated on Cholesterol goals;  Benefits of statin for ASCVD risk reduction; -Recommended to continue current medication  Patient Goals/Self-Care Activities Patient will:  - take medications as prescribed as evidenced by patient report and record review  Follow Up Plan: The patient has been provided with contact information for the care management team and has been advised to call with any health related questions or concerns.       Medication Assistance: None required.  Patient affirms current coverage meets needs.  Compliance/Adherence/Medication fill history: Care Gaps: Pneumonia Vaccine Ophthalmology Exam Shingrix Vaccine COVID-19 Vaccine Foot Exam Urine Microalbuminuria  Star-Rating Drugs: Pravastatin 80 mg tablet   Patient's preferred pharmacy is:  [facility] [place] [address] [place] [address] Phone: [phone] Fax: [phone]  [facility] [address] [place], [place] [address] [place] [address] Phone: [phone] Fax: [phone]  Uses pill box?  Yes Pt endorses 85% compliance  We discussed: Benefits of medication synchronization, packaging and delivery  as well as enhanced pharmacist oversight with [employer]. Patient decided to: Continue current medication management strategy  Care Plan and Follow Up Patient Decision:  Patient agrees to Care Plan and Follow-up.  Plan: The patient has been provided with contact information for the care management team and has been advised to call with any health related questions or concerns.   Orlando Penner, CPP, PharmD Clinical Pharmacist Practitioner [facility] [phone]

## 2021-10-07 NOTE — Chronic Care Management (AMB) (Signed)
  Care Management   Note  10/07/2021 Name: Tiyanna Larcom MRN: [id number] DOB: 03/15/1929  Saher Davee is a 87 y.o. year old female who is a primary care patient of [name], [place] and is actively engaged with the care management team. I reached out to [facility] by phone today to assist with re-scheduling a follow up visit with the Pharmacist  Follow up plan: Telephone appointment with care management team member scheduled for:10/06/21  [place] Management  Direct Dial: [phone]

## 2021-10-12 ENCOUNTER — Ambulatory Visit: Payer: Medicare Other | Admitting: Cardiology

## 2021-10-12 NOTE — Patient Instructions (Signed)
Visit Information It was great speaking with you today!  Please let me know if you have any questions about our visit.   Goals Addressed             This Visit's Progress    Manage My Medicine       Timeframe:  Long-Range Goal Priority:  High Start Date:                             Expected End Date:                       Follow Up Date 04/13/2022   - call for medicine refill 2 or 3 days before it runs out - keep a list of all the medicines I take; vitamins and herbals too - use a pillbox to sort medicine - use an alarm clock or phone to remind me to take my medicine    Why is this important?   These steps will help you keep on track with your medicines.   Notes:  Patient to begin pill packaging system for medication         Patient Care Plan: CCM Pharmacy Care Plan     Problem Identified: HTN, HLD      Long-Range Goal: Disease Management   Recent Progress: On track  Note:   Current Barriers:  Unable to independently monitor therapeutic efficacy  Pharmacist Clinical Goal(s):  Patient will achieve adherence to monitoring guidelines and medication adherence to achieve therapeutic efficacy through collaboration with PharmD and provider.   Interventions: 1:1 collaboration with [name], FNP regarding development and update of comprehensive plan of care as evidenced by provider attestation and co-signature Inter-disciplinary care team collaboration (see longitudinal plan of care) Comprehensive medication review performed; medication list updated in electronic medical record  Hypertension (BP goal <140/90) -Controlled -Current treatment: Amlodipine 5 mg tablet once per day Metoprolol Succinate 25 mg tablet once per day  -Current home readings: 130/70, 130/88, 130/75 -Current exercise habits: she exercises and walks around with her daughter, they walk for at least 45 minutes twice per day  -Denies hypotensive/hypertensive symptoms -Educated on Daily salt  intake goal < 2300 mg; -Counseled to monitor BP at home at least four times per day, document, and provide log at future appointments -Recommended to continue current medication  Hyperlipidemia: (LDL goal < 70) -Controlled -Current treatment: Pravastatin 80 mg tablet once per day   -Current exercise habits: she exercises and walks around with her daughter, they walk for at least 45 minutes twice per day  -Educated on Cholesterol goals;  Benefits of statin for ASCVD risk reduction; -Recommended to continue current medication  Patient Goals/Self-Care Activities Patient will:  - take medications as prescribed as evidenced by patient report and record review  Follow Up Plan: The patient has been provided with contact information for the care management team and has been advised to call with any health related questions or concerns.        Patient agreed to services and verbal consent obtained.   The patient verbalized understanding of instructions, educational materials, and care plan provided today and agreed to receive a mailed copy of patient instructions, educational materials, and care plan.   Orlando Penner, PharmD Clinical Pharmacist [facility] [phone]

## 2021-10-13 ENCOUNTER — Ambulatory Visit: Payer: Medicare Other

## 2021-10-13 ENCOUNTER — Ambulatory Visit (INDEPENDENT_AMBULATORY_CARE_PROVIDER_SITE_OTHER): Payer: Medicare Other

## 2021-10-13 VITALS — BP 132/82 | Ht 67.0 in | Wt 199.0 lb

## 2021-10-13 DIAGNOSIS — Z0000 Encounter for general adult medical examination without abnormal findings: Secondary | ICD-10-CM

## 2021-10-13 NOTE — Patient Instructions (Signed)
Jill Bowman , Thank you for taking time to come for your Medicare Wellness Visit. I appreciate your ongoing commitment to your health goals. Please review the following plan we discussed and let me know if I can assist you in the future.   Screening recommendations/referrals: Colonoscopy: not required Mammogram: not required Bone Density: due Recommended yearly ophthalmology/optometry visit for glaucoma screening and checkup Recommended yearly dental visit for hygiene and checkup  Vaccinations: Influenza vaccine: completed 08/23/2021 Pneumococcal vaccine: daughter believes this has been given Tdap vaccine: completed 08/07/2018, due 08/07/2028 Shingles vaccine: discussed   Covid-19: 09/02/2021, 03/31/2021, 09/23/2020, 03/20/2020, 02/21/2020  Advanced directives: Please bring a copy of your POA (Power of Attorney) and/or Living Will to your next appointment.   Conditions/risks identified: none  Next appointment: Follow up in one year for your annual wellness visit    Preventive Care 65 Years and Older, Female Preventive care refers to lifestyle choices and visits with your health care provider that can promote health and wellness. What does preventive care include? A yearly physical exam. This is also called an annual well check. Dental exams once or twice a year. Routine eye exams. Ask your health care provider how often you should have your eyes checked. Personal lifestyle choices, including: Daily care of your teeth and gums. Regular physical activity. Eating a healthy diet. Avoiding tobacco and drug use. Limiting alcohol use. Practicing safe sex. Taking low-dose aspirin every day. Taking vitamin and mineral supplements as recommended by your health care provider. What happens during an annual well check? The services and screenings done by your health care provider during your annual well check will depend on your age, overall health, lifestyle risk factors, and family history of  disease. Counseling  Your health care provider may ask you questions about your: Alcohol use. Tobacco use. Drug use. Emotional well-being. Home and relationship well-being. Sexual activity. Eating habits. History of falls. Memory and ability to understand (cognition). Work and work Statistician. Reproductive health. Screening  You may have the following tests or measurements: Height, weight, and BMI. Blood pressure. Lipid and cholesterol levels. These may be checked every 5 years, or more frequently if you are over 85 years old. Skin check. Lung cancer screening. You may have this screening every year starting at age 85 if you have a 30-pack-year history of smoking and currently smoke or have quit within the past 15 years. Fecal occult blood test (FOBT) of the stool. You may have this test every year starting at age 85. Flexible sigmoidoscopy or colonoscopy. You may have a sigmoidoscopy every 5 years or a colonoscopy every 10 years starting at age 85. Hepatitis C blood test. Hepatitis B blood test. Sexually transmitted disease (STD) testing. Diabetes screening. This is done by checking your blood sugar (glucose) after you have not eaten for a while (fasting). You may have this done every 1-3 years. Bone density scan. This is done to screen for osteoporosis. You may have this done starting at age 85. Mammogram. This may be done every 1-2 years. Talk to your health care provider about how often you should have regular mammograms. Talk with your health care provider about your test results, treatment options, and if necessary, the need for more tests. Vaccines  Your health care provider may recommend certain vaccines, such as: Influenza vaccine. This is recommended every year. Tetanus, diphtheria, and acellular pertussis (Tdap, Td) vaccine. You may need a Td booster every 10 years. Zoster vaccine. You may need this after age 17. Pneumococcal 13-valent  conjugate (PCV13) vaccine. One  dose is recommended after age 45. Pneumococcal polysaccharide (PPSV23) vaccine. One dose is recommended after age 109. Talk to your health care provider about which screenings and vaccines you need and how often you need them. This information is not intended to replace advice given to you by your health care provider. Make sure you discuss any questions you have with your health care provider. Document Released: 12/10/2015 Document Revised: 08/02/2016 Document Reviewed: 09/14/2015 Elsevier Interactive Patient Education  2017 [place] Prevention in the Home Falls can cause injuries. They can happen to people of all ages. There are many things you can do to make your home safe and to help prevent falls. What can I do on the outside of my home? Regularly fix the edges of walkways and driveways and fix any cracks. Remove anything that might make you trip as you walk through a door, such as a raised step or threshold. Trim any bushes or trees on the path to your home. Use bright outdoor lighting. Clear any walking paths of anything that might make someone trip, such as rocks or tools. Regularly check to see if handrails are loose or broken. Make sure that both sides of any steps have handrails. Any raised decks and porches should have guardrails on the edges. Have any leaves, snow, or ice cleared regularly. Use sand or salt on walking paths during winter. Clean up any spills in your garage right away. This includes oil or grease spills. What can I do in the bathroom? Use night lights. Install grab bars by the toilet and in the tub and shower. Do not use towel bars as grab bars. Use non-skid mats or decals in the tub or shower. If you need to sit down in the shower, use a plastic, non-slip stool. Keep the floor dry. Clean up any water that spills on the floor as soon as it happens. Remove soap buildup in the tub or shower regularly. Attach bath mats securely with double-sided  non-slip rug tape. Do not have throw rugs and other things on the floor that can make you trip. What can I do in the bedroom? Use night lights. Make sure that you have a light by your bed that is easy to reach. Do not use any sheets or blankets that are too big for your bed. They should not hang down onto the floor. Have a firm chair that has side arms. You can use this for support while you get dressed. Do not have throw rugs and other things on the floor that can make you trip. What can I do in the kitchen? Clean up any spills right away. Avoid walking on wet floors. Keep items that you use a lot in easy-to-reach places. If you need to reach something above you, use a strong step stool that has a grab bar. Keep electrical cords out of the way. Do not use floor polish or wax that makes floors slippery. If you must use wax, use non-skid floor wax. Do not have throw rugs and other things on the floor that can make you trip. What can I do with my stairs? Do not leave any items on the stairs. Make sure that there are handrails on both sides of the stairs and use them. Fix handrails that are broken or loose. Make sure that handrails are as long as the stairways. Check any carpeting to make sure that it is firmly attached to the stairs. Fix any carpet that  is loose or worn. Avoid having throw rugs at the top or bottom of the stairs. If you do have throw rugs, attach them to the floor with carpet tape. Make sure that you have a light switch at the top of the stairs and the bottom of the stairs. If you do not have them, ask someone to add them for you. What else can I do to help prevent falls? Wear shoes that: Do not have high heels. Have rubber bottoms. Are comfortable and fit you well. Are closed at the toe. Do not wear sandals. If you use a stepladder: Make sure that it is fully opened. Do not climb a closed stepladder. Make sure that both sides of the stepladder are locked into place. Ask  someone to hold it for you, if possible. Clearly mark and make sure that you can see: Any grab bars or handrails. First and last steps. Where the edge of each step is. Use tools that help you move around (mobility aids) if they are needed. These include: Canes. Walkers. Scooters. Crutches. Turn on the lights when you go into a dark area. Replace any light bulbs as soon as they burn out. Set up your furniture so you have a clear path. Avoid moving your furniture around. If any of your floors are uneven, fix them. If there are any pets around you, be aware of where they are. Review your medicines with your doctor. Some medicines can make you feel dizzy. This can increase your chance of falling. Ask your doctor what other things that you can do to help prevent falls. This information is not intended to replace advice given to you by your health care provider. Make sure you discuss any questions you have with your health care provider. Document Released: 09/09/2009 Document Revised: 04/20/2016 Document Reviewed: 12/18/2014 Elsevier Interactive Patient Education  2017 [employer].
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
## 2021-10-14 ENCOUNTER — Telehealth: Payer: Self-pay | Admitting: Adult Health

## 2021-10-14 NOTE — Telephone Encounter (Signed)
Pt's daughter, Jaedyn Marrufo (not on DPR) pt is not sleeping, staying up all night, want to sleep doing the day, sundowning. Would like a call from the nurse.

## 2021-10-17 ENCOUNTER — Other Ambulatory Visit: Payer: Self-pay | Admitting: Nurse Practitioner

## 2021-10-17 IMAGING — DX DG SHOULDER 2+V*L*
2 series · 2 of 2 positions shown · non-contrast
Comparison: None similar

CLINICAL DATA: Left shoulder pain for a few days, unable to move
arm.

EXAM:
LEFT SHOULDER - 2+ VIEW

[shoulder grashey]
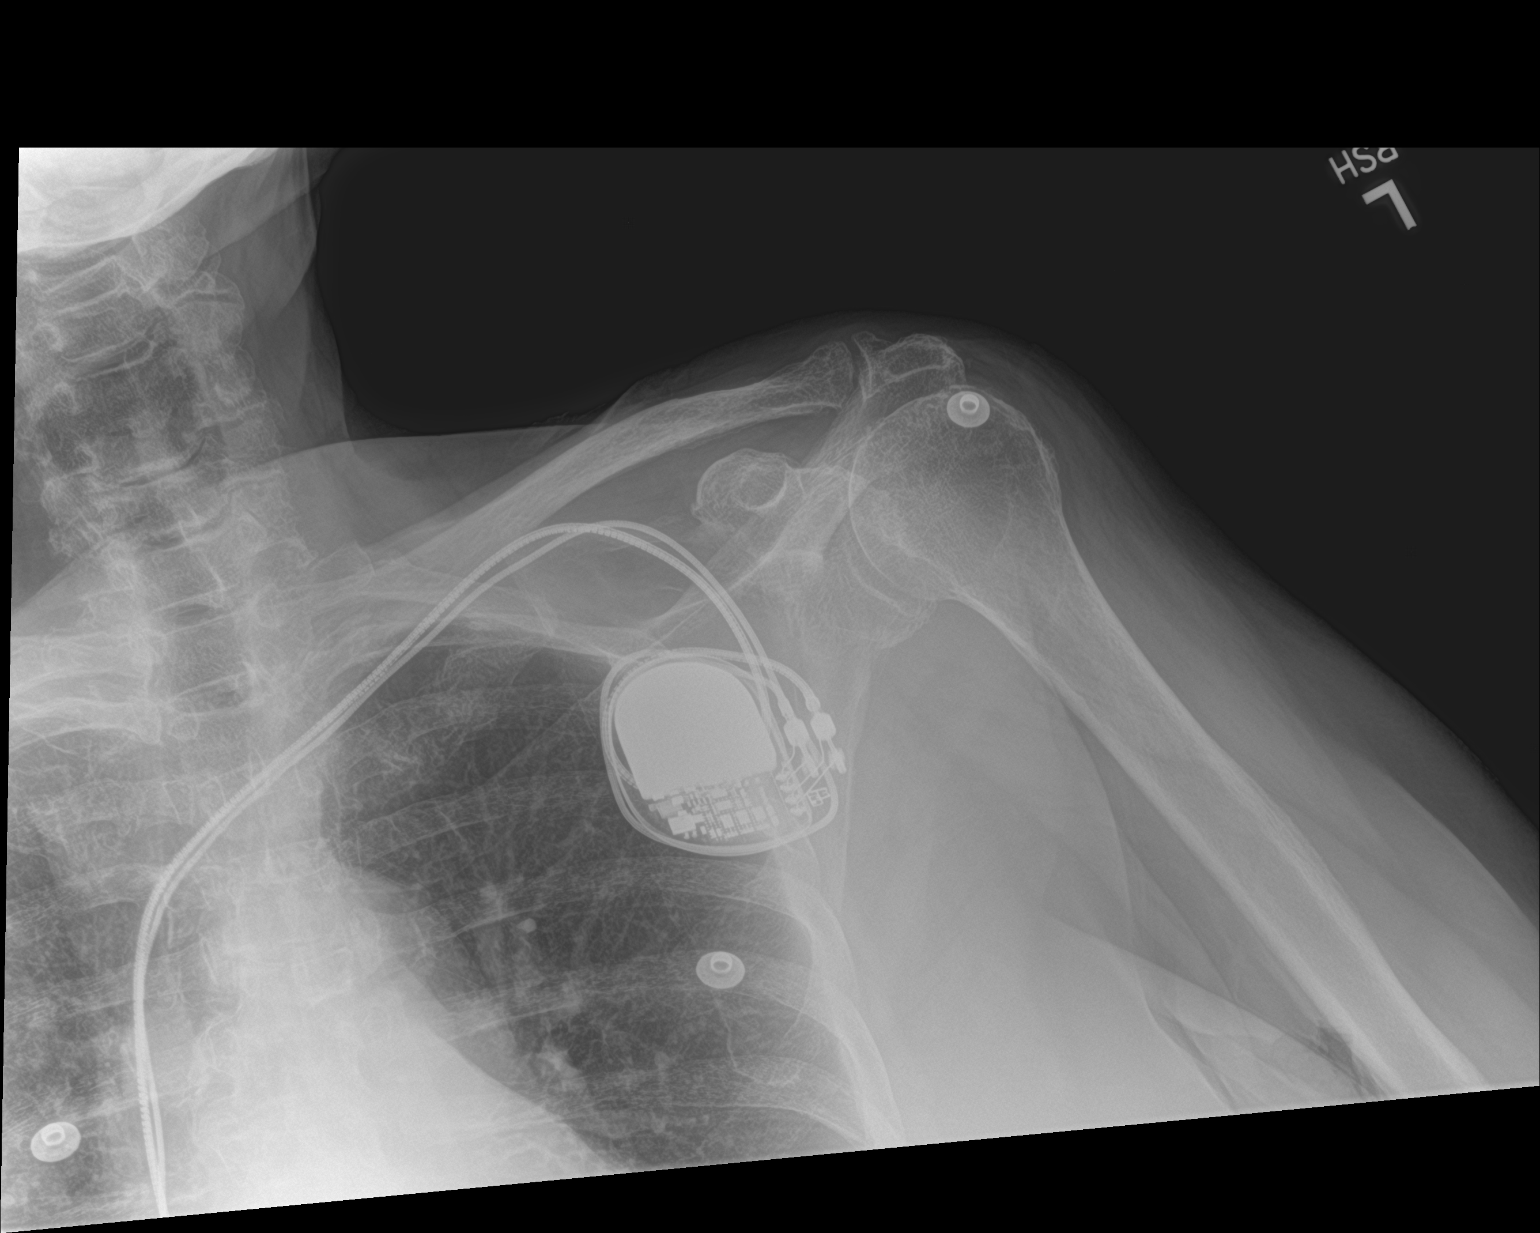

[shoulder y view]
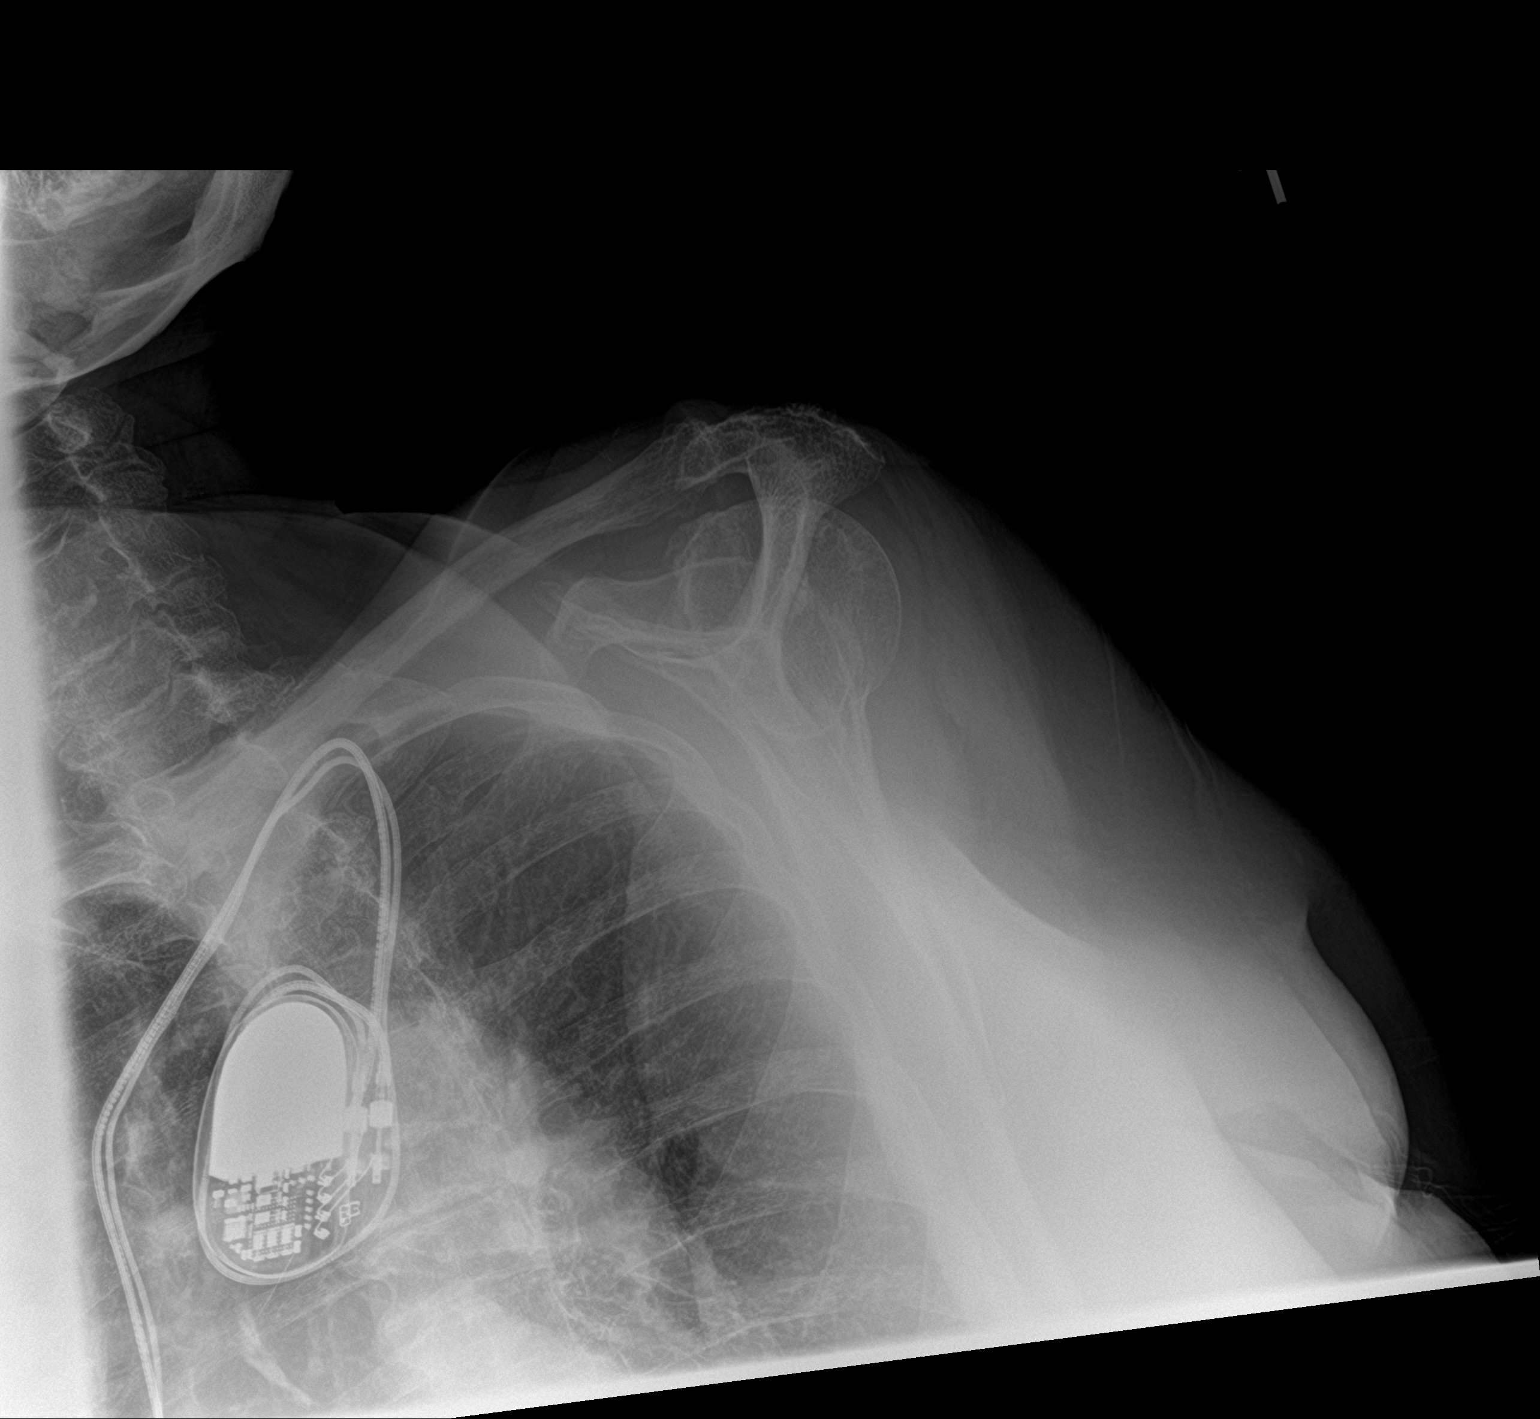

[2 of 2 positions shown; findings below may reference images not displayed]

FINDINGS: Degenerative spurring at the acromioclavicular joint. No evidence of
fracture, soft tissue calcification or bone lesion. Generalized
osteopenic appearance
IMPRESSION: 1. No acute finding.
2. Acromioclavicular osteoarthritis.

## 2021-10-17 NOTE — Telephone Encounter (Signed)
I called daughter of pt.  Pt is up at night 3-4 x week, sundowning.  Goes to sleep then up after an hour, awake till-- AM, will then want to sleep during day.  Has been with this daughter since  08/2021, living arrangements hard on this daughter and having pt there that is making sounds all the time.  (The sounds are not new), pt on seroquel 50mg  po qhs, and aricept 10mg  po qhs. Takes gabapentin 300mg  pobid  and voltaren from pcp,.  She does not feel that is an acute issue, or any infection.  I relayed that will forward message to MM/NP to see if any adjustment to her medications.  Had appt 10-24-21 but cannot bring her due to transportation, rescheduled to n/a 02-02-2022.

## 2021-10-18 ENCOUNTER — Other Ambulatory Visit: Payer: Self-pay | Admitting: Cardiology

## 2021-10-18 DIAGNOSIS — I4892 Unspecified atrial flutter: Secondary | ICD-10-CM

## 2021-10-18 NOTE — Telephone Encounter (Signed)
I called daughter, Jill Bowman.  I relayed that since MM/NP had not seen her since May 2022, recommend to see her in office (not VV) has has memory issues).  She did verbalize understanding.  She did not have any availability to have someone bring her, if January was an option that would be better for her.  Will place on waitlist.  Has March appt at this time. She had also been in touch with her pcp, and will get urine sample to check for infection.  She appreciated call back.

## 2021-10-24 ENCOUNTER — Ambulatory Visit: Payer: Medicare Other | Admitting: Adult Health

## 2021-10-26 DIAGNOSIS — I10 Essential (primary) hypertension: Secondary | ICD-10-CM

## 2021-10-26 DIAGNOSIS — E782 Mixed hyperlipidemia: Secondary | ICD-10-CM | POA: Diagnosis not present

## 2021-11-04 ENCOUNTER — Ambulatory Visit: Payer: Medicare Other

## 2021-11-04 DIAGNOSIS — I10 Essential (primary) hypertension: Secondary | ICD-10-CM

## 2021-11-04 DIAGNOSIS — F0280 Dementia in other diseases classified elsewhere without behavioral disturbance: Secondary | ICD-10-CM

## 2021-11-04 DIAGNOSIS — G301 Alzheimer's disease with late onset: Secondary | ICD-10-CM

## 2021-11-04 NOTE — Chronic Care Management (AMB) (Signed)
Chronic Care Management    Social Work Note  11/04/2021 Name: Jill Bowman MRN: [id number] DOB: 17-May-1929  Jill Bowman is a 87 y.o. year old female who is a primary care patient of [name], [place]. The CCM team was consulted to assist the patient with chronic disease management and/or care coordination needs related to:  HTN, Alzheimer's Disease .   Engaged with patients daughter Jill Bowman by phone  for follow up visit in response to provider referral for social work chronic care management and care coordination services.   Consent to Services:  The patient was given information about Chronic Care Management services, agreed to services, and gave verbal consent prior to initiation of services.  Please see initial visit note for detailed documentation.   Patient agreed to services and consent obtained.   Assessment: Review of patient past medical history, allergies, medications, and health status, including review of relevant consultants reports was performed today as part of a comprehensive evaluation and provision of chronic care management and care coordination services.     SDOH (Social Determinants of Health) assessments and interventions performed:    Advanced Directives Status: Not addressed in this encounter.  CCM Care Plan  Allergies  Allergen Reactions   Codeine Itching, Rash and Other (See Comments)    Full body rash    Hydroxyzine Other (See Comments)    Extreme confusion and hallucinations   Lorazepam Other (See Comments)    Extreme confusion, hallucinations and hyperactivity   Penicillins Anaphylaxis, Hives and Shortness Of Breath    Tolerated cefazolin and ceftriaxone in 2013   Sulfa Antibiotics Shortness Of Breath   Zinc Itching   Ciprofloxacin Other (See Comments)    unknown   Ciprofloxacin Hives and Other (See Comments)    "think I break out in welts"    Latex Rash and Other (See Comments)    Tears skin    Penicillins Other (See Comments)     Unknown    Sulfa Antibiotics Other (See Comments)    unknown    Outpatient Encounter Medications as of 11/04/2021  Medication Sig   acetaminophen (TYLENOL) 500 MG tablet Take 500 mg by mouth every 6 (six) hours as needed for mild pain.   acidophilus (RISAQUAD) CAPS capsule Take 1 capsule by mouth daily.   amiodarone (PACERONE) 100 MG tablet TAKE ONE TABLET BY MOUTH EVERY DAY ONCE DAILY IN THE MORNING]   amLODipine (NORVASC) 5 MG tablet TAKE ONE TABLET BY MOUTH EVERY DAY   carboxymethylcellulose (REFRESH PLUS) 0.5 % SOLN Place 1 drop into both eyes 3 (three) times daily as needed (dry eyes).   diclofenac sodium (VOLTAREN) 1 % GEL Apply 2 g topically 4 (four) times daily.   docusate sodium (COLACE) 100 MG capsule Take 200 mg by mouth at bedtime.   donepezil (ARICEPT) 10 MG tablet Take 1 tablet (10 mg total) by mouth at bedtime.   gabapentin (NEURONTIN) 300 MG capsule Take 1 capsule (300 mg total) by mouth 2 (two) times daily.   lidocaine (XYLOCAINE) 5 % ointment Apply 1 application topically as needed.   loratadine (CLARITIN) 10 MG tablet Take 10 mg by mouth daily as needed for allergies.    metoprolol succinate (TOPROL-XL) 25 MG 24 hr tablet Take 1 tablet (25 mg total) by mouth daily. Take with or immediately following a meal.   nitroGLYCERIN (NITROSTAT) 0.4 MG SL tablet dissolve ONE UNDER THE TONGUE EVERY FIVE minutes FOR not more THAN THREE doses   pravastatin (PRAVACHOL) 80 MG tablet Take  1 tablet (80 mg total) by mouth daily.   QUEtiapine (SEROQUEL) 50 MG tablet Take 1 tablet (50 mg total) by mouth at bedtime.   XARELTO 20 MG TABS tablet TAKE 1 TABLET BY MOUTH ONCE DAILY WITH SUPPER   No facility-administered encounter medications on file as of 11/04/2021.    Patient Active Problem List   Diagnosis Date Noted   Abnormal glucose 05/25/2020   Left knee pain 05/06/2020   Type 2 diabetes mellitus ([place]) 04/26/2020   CKD (chronic kidney disease), stage III ([place]) 04/26/2020   SBO (small  bowel obstruction) ([place]) 04/26/2020   Transient ischemic attack (TIA) 04/12/2020   Progressive dementia with uncertain etiology ([place]) 04/12/2020   Dementia due to medical condition with behavioral disturbance 04/12/2020   Paranoid reaction ([place]) 04/12/2020   Encounter for care of pacemaker 09/12/2019   Stented coronary artery 11/07/2018   Pseudoseizure    Late onset Alzheimer's disease without behavioral disturbance ([place])    GERD (gastroesophageal reflux disease) 01/17/2018   Depression 01/17/2018   Atrial tachycardia ([place]) 01/17/2018   HLD (hyperlipidemia) 01/17/2018   Breast cancer of lower-outer quadrant of right female breast ([place]) 01/03/2016   Open wound of abdominal wall without complication 46/96/2952   Cellulitis of abdominal wall 03/24/2015   Elevated alkaline phosphatase level 12/28/2014   Abdominal wall abscess 10/19/2014   Bradycardia 10/19/2014   Physical deconditioning 09/30/2012   Soft tissue infection, L leg 09/11/2012   CAD (coronary artery disease) 07/16/2012   Pacemaker  Dual chamber Medtronic Adapta L ADDRL1  07/16/2012   Morbid obesity ([place]) 07/16/2012   Degloving injury of lower leg, Left 07/04/2012   Tachycardia-bradycardia syndrome ([place])     Conditions to be addressed/monitored: HTN and Dementia  Care Plan : Social Work [place]  Updates made by Daneen Schick since 11/04/2021 12:00 AM  Completed 11/04/2021   Problem: Barriers to Treatment Resolved 11/04/2021     Long-Range Goal: Barriers to Treatment Identified and Managed Completed 11/04/2021  Start Date: 05/09/2021  Recent Progress: On track  Priority: Low  Note:   Current Barriers:  Chronic disease management support and education needs related to  Alzheimer's Disease, HTN, Mixed Hyperlipidemia, and Abnormal Glucose   Transportation Financial strain related to the cost of incontinent supplies  Social Worker Clinical Goal(s):  patient will work with SW to identify and address any acute  and/or chronic care coordination needs related to the self health management of  Alzheimer's Disease, HTN, Mixed Hyperlipidemia, and Abnormal Glucose   Patient will work with SW to become more knowledgeable of transportation resources Patient will work with SW to identify possible resources to assist with incontinent products Patient will work with SW to become more knowledgeable of caregiver resources  SW Interventions:  Inter-disciplinary care team collaboration (see longitudinal plan of care) Collaboration with [name], [place] regarding development and update of comprehensive plan of care as evidenced by provider attestation and co-signature Successful outbound call placed to the patients daughter, Amily Depp to assess for care coordination needs Determined the patient is no longer staying with Jill Bowman but is staying with another daughter in [place] Discussed patient is doing well with no acute challenges at this time Encouraged Jill Bowman to contact the patients primary care team as needed   Patient Goals/Self-Care Activities patient will: with the help of her daughter  -  Attend [place] provider appointment -Contact SW as needed         Follow Up Plan:  No SW follow up planned  at this time. The patient will remain engaged with RN Care Manager and PharmD.      Daneen Schick, BSW, CDP Social Worker, Certified Dementia Practitioner [place] [facility] [phone]

## 2021-11-04 NOTE — Patient Instructions (Signed)
Social Worker Visit Information  Goals we discussed today:   Goals Addressed             This Visit's Progress    COMPLETED: Barriers to Treatment Identified and Managed       Timeframe:  Long-Range Goal Priority:  Low Start Date:  6.13.22                                  Patient Goals/Self-Care Activities  patient will: with the help of her daughter  -  Attend upcomming provider appointment -Contact SW as needed          Patient verbalizes understanding of instructions provided today and agrees to view in [place].   Follow Up Plan:  No SW follow up planned at this time. Please contact me as needed.  Daneen Schick, BSW, CDP Social Worker, Certified Dementia Practitioner [place] [facility] [phone]

## 2021-11-29 ENCOUNTER — Telehealth: Payer: Self-pay

## 2021-11-29 NOTE — Progress Notes (Signed)
° ° °  Chronic Care Management Pharmacy Assistant   Name: Jill Bowman  MRN: [id number] DOB: September 26, 1929   Reason for Encounter: Called patient to reschedule appointment with CPP on 04/13/2022 to 04/14/2022 because CPP schedule changed voicemail left.    [place] Pharmacist Assistant [phone]

## 2021-12-27 ENCOUNTER — Ambulatory Visit: Payer: Medicare Other | Admitting: Nurse Practitioner

## 2021-12-29 ENCOUNTER — Telehealth: Payer: Self-pay

## 2021-12-29 ENCOUNTER — Telehealth: Payer: Medicare Other

## 2021-12-29 NOTE — Telephone Encounter (Signed)
°  Care Management   Follow Up Note   12/29/2021 Name: Jill Bowman MRN: [id number] DOB: [birth year]   Referred by: [name], FNP Reason for referral : Chronic Care Management (RN CM Follow up call )   An unsuccessful telephone outreach was attempted today. The patient was referred to the case management team for assistance with care management and care coordination.   Follow Up Plan: A HIPPA compliant phone message was left for the patient providing contact information and requesting a return call.   Barb Merino, RN, BSN, CCM Care Management Coordinator [place] [facility]  Direct Phone: [phone]

## 2022-01-02 ENCOUNTER — Telehealth: Payer: Self-pay

## 2022-01-02 NOTE — Chronic Care Management (AMB) (Signed)
Chronic Care Management Pharmacy Assistant   Name: Jill Bowman  Bowman: [id number] DOB: Apr 02, 1929  Reason for Encounter: Disease State/ General  Recent office visits:  10-13-2021 Jill Simmering, LPN  Medicare annual wellness visit.  11-04-2021 Jill Bowman (CCM)  Recent consult visits:  None  Hospital visits:  None in previous 6 months  Medications: Outpatient Encounter Medications as of 01/02/2022  Medication Sig   acetaminophen (TYLENOL) 500 MG tablet Take 500 mg by mouth every 6 (six) hours as needed for mild pain.   acidophilus (RISAQUAD) CAPS capsule Take 1 capsule by mouth daily.   amiodarone (PACERONE) 100 MG tablet TAKE ONE TABLET BY MOUTH EVERY DAY ONCE DAILY IN THE MORNING]   amLODipine (NORVASC) 5 MG tablet TAKE ONE TABLET BY MOUTH EVERY DAY   carboxymethylcellulose (REFRESH PLUS) 0.5 % SOLN Place 1 drop into both eyes 3 (three) times daily as needed (dry eyes).   diclofenac sodium (VOLTAREN) 1 % GEL Apply 2 g topically 4 (four) times daily.   docusate sodium (COLACE) 100 MG capsule Take 200 mg by mouth at bedtime.   donepezil (ARICEPT) 10 MG tablet Take 1 tablet (10 mg total) by mouth at bedtime.   gabapentin (NEURONTIN) 300 MG capsule Take 1 capsule (300 mg total) by mouth 2 (two) times daily.   lidocaine (XYLOCAINE) 5 % ointment Apply 1 application topically as needed.   loratadine (CLARITIN) 10 MG tablet Take 10 mg by mouth daily as needed for allergies.    metoprolol succinate (TOPROL-XL) 25 MG 24 hr tablet Take 1 tablet (25 mg total) by mouth daily. Take with or immediately following a meal.   nitroGLYCERIN (NITROSTAT) 0.4 MG SL tablet dissolve ONE UNDER THE TONGUE EVERY FIVE minutes FOR not more THAN THREE doses   pravastatin (PRAVACHOL) 80 MG tablet Take 1 tablet (80 mg total) by mouth daily.   QUEtiapine (SEROQUEL) 50 MG tablet Take 1 tablet (50 mg total) by mouth at bedtime.   XARELTO 20 MG TABS tablet TAKE 1 TABLET BY MOUTH ONCE DAILY WITH SUPPER    No facility-administered encounter medications on file as of 01/02/2022.  Reviewed chart prior to disease state call. Spoke with patient's son regarding BP  Recent Office Vitals: BP Readings from Last 3 Encounters:  10/13/21 132/82  08/23/21 130/88  08/09/21 (!) 145/76   Pulse Readings from Last 3 Encounters:  08/23/21 63  08/09/21 80  05/26/21 (!) 55    Wt Readings from Last 3 Encounters:  10/13/21 199 lb (90.3 kg)  08/23/21 190 lb 9.6 oz (86.5 kg)  08/09/21 193 lb 9.6 oz (87.8 kg)     Kidney Function Lab Results  Component Value Date/Time   CREATININE 1.29 (H) 08/23/2021 12:54 PM   CREATININE 1.20 (H) 05/26/2021 05:17 PM   CREATININE 1.1 02/24/2016 09:44 AM   GFR 47.30 (L) 06/19/2011 11:40 AM   GFRNONAA 43 (L) 05/26/2021 05:17 PM   GFRAA 40 (L) 10/07/2020 04:06 PM    BMP Latest Ref Rng & Units 08/23/2021 05/26/2021 04/20/2021  Glucose 70 - 99 mg/dL 81 73 85  BUN 10 - 36 mg/dL 14 16 11   Creatinine 0.57 - 1.00 mg/dL 1.29(H) 1.20(H) 1.20(H)  BUN/Creat Ratio 12 - 28 11(L) - 9(L)  Sodium 134 - 144 mmol/L 144 139 142  Potassium 3.5 - 5.2 mmol/L 4.2 3.9 4.2  Chloride 96 - 106 mmol/L 106 107 104  CO2 20 - 29 mmol/L 23 25 24   Calcium 8.7 - 10.3 mg/dL 8.9 8.7(L) 9.4  Current antihypertensive regimen:  Amlodipine 5 mg daily Metoprolol Succinate 25 mg daily  How often are you checking your Blood Pressure? Patient lives with son now in [place]. Patient's son wasn't aware of checking patient's blood pressure but will start and will document.  Current home BP readings: 01-02-2022 136/59 69.  What recent interventions/DTPs have been made by any provider to improve Blood Pressure control since last CPP Visit:  Educated on Daily salt intake goal < 2300 mg; -Counseled to monitor BP at home at least four times per day, document, and provide log at future appointments -Recommended to continue current medication  Any recent hospitalizations or ED visits since last visit with  CPP? No  What diet changes have been made to improve Blood Pressure Control?  Patient's son states patient has a great appetite. Patient's son states patient doesn't use salt, drinks plenty of water and eats fruits/vegetables.  What exercise is being done to improve your Blood Pressure Control?  Patient's son states patient walks around the house.  Adherence Review: Is the patient currently on ACE/ARB medication? No Does the patient have >5 day gap between last estimated fill dates? No  01/02/2022 Name: Jill Bowman: [id number] DOB: 11-10-1929 Jill Bowman is a 88 y.o. year old female who is a primary care patient of Jill Bowman, [place].  Comprehensive medication review performed; Spoke to patient regarding cholesterol  Lipid Panel    Component Value Date/Time   CHOL 131 04/20/2021 0922   TRIG 123 04/20/2021 0922   HDL 49 04/20/2021 0922   LDLCALC 60 04/20/2021 0922    10-year ASCVD risk score: The ASCVD Risk score (Arnett DK, et al., 2019) failed to calculate for the following reasons:   The 2019 ASCVD risk score is only valid for ages 53 to 56  Current antihyperlipidemic regimen:  Pravastatin 80 mg daily  Previous antihyperlipidemic medications tried: None  ASCVD risk enhancing conditions: age >66 and HTN  What recent interventions/DTPs have been made by any provider to improve Cholesterol control since last CPP Visit:  Educated on Cholesterol goals;  Benefits of statin for ASCVD risk reduction; -Recommended to continue current medication  Any recent hospitalizations or ED visits since last visit with CPP? No  What diet changes have been made to improve Cholesterol?  Patient's son stated patient has limited her fried and greasy food intake.  What exercise is being done to improve Cholesterol?  Patient's son states patient walks around the house.  Adherence Review: Does the patient have >5 day gap between last estimated fill dates? No  Care Gaps: PNA Vac  overdue  OPHTHALMOLOGY EXAM (Yearly) overdue Zoster Vaccines- Shingrix (1 of 2) overdue DEXA SCAN (Once) overdue FOOT EXAM (Yearly)  overdue URINE MICROALBUMIN (Yearly) overdue  HEMOGLOBIN A1C (Every 6 Months) overdue  Star Rating Drugs: Pravastatin 80 mg- Last filled 12-19-2021 90 DS [place] Clinical Pharmacist Assistant [phone]

## 2022-01-12 ENCOUNTER — Telehealth: Payer: Self-pay

## 2022-01-12 ENCOUNTER — Telehealth: Payer: Medicare Other

## 2022-01-12 NOTE — Telephone Encounter (Signed)
°  Care Management   Follow Up Note   01/12/2022 Name: Jill Bowman MRN: [id number] DOB: 01-Sep-1929   Referred by: [name], FNP Reason for referral : Chronic Care Management (RN CM Follow up call #2)   A second unsuccessful telephone outreach was attempted today. The patient was referred to the case management team for assistance with care management and care coordination.   Follow Up Plan: A HIPPA compliant phone message was left for the patient providing contact information and requesting a return call.   Barb Merino, RN, BSN, CCM Care Management Coordinator [place] [facility]  Direct Phone: [phone]

## 2022-01-25 ENCOUNTER — Telehealth: Payer: Medicare Other

## 2022-01-25 ENCOUNTER — Telehealth: Payer: Self-pay

## 2022-01-25 ENCOUNTER — Ambulatory Visit (INDEPENDENT_AMBULATORY_CARE_PROVIDER_SITE_OTHER): Payer: Medicare Other

## 2022-01-25 DIAGNOSIS — E782 Mixed hyperlipidemia: Secondary | ICD-10-CM

## 2022-01-25 DIAGNOSIS — I10 Essential (primary) hypertension: Secondary | ICD-10-CM

## 2022-01-25 DIAGNOSIS — N1832 Chronic kidney disease, stage 3b: Secondary | ICD-10-CM

## 2022-01-25 DIAGNOSIS — R7309 Other abnormal glucose: Secondary | ICD-10-CM

## 2022-01-25 DIAGNOSIS — F0280 Dementia in other diseases classified elsewhere without behavioral disturbance: Secondary | ICD-10-CM

## 2022-01-25 NOTE — Chronic Care Management (AMB) (Signed)
?  Care Management  ? ?Follow Up Note ? ? ?01/25/2022 ?Name: Jill Bowman MRN: [id number] DOB: 05/22/1929 ? ? ?Referred by: [name], FNP ?Reason for referral : Chronic Care Management (RN CM Follow up call - #3) ? ? ?Third unsuccessful telephone outreach was attempted today. The patient was referred to the case management team for assistance with care management and care coordination. The patient's primary care provider has been notified of our unsuccessful attempts to make or maintain contact with the patient. The care management team is pleased to engage with this patient at any time in the future should he/she be interested in assistance from the care management team.  ? ?Follow Up Plan: We have been unable to make contact with the patient for follow up. The care management team is available to follow up with the patient after provider conversation with the patient regarding recommendation for care management engagement and subsequent re-referral to the care management team.  ? ?Barb Merino, RN, BSN, CCM ?Care Management Coordinator ?[place] [facility]  ?Direct Phone: [phone] ? ? ?

## 2022-01-25 NOTE — Addendum Note (Signed)
Addended by: Lynne Logan on: 01/25/2022 02:03 PM ? ? Modules accepted: Orders, Level of Service ? ?

## 2022-01-25 NOTE — Telephone Encounter (Signed)
This encounter was created in error - please disregard.

## 2022-01-25 NOTE — Telephone Encounter (Signed)
?  Care Management  ? ?Follow Up Note ? ? ?01/25/2022 ?Name: Jill Bowman MRN: [id number] DOB: [birth year] ? ? ?Referred by: [name], FNP ?Reason for referral : Chronic Care Management (RN CM Follow up call - #3 ) ? ? ?Third unsuccessful telephone outreach was attempted today. The patient was referred to the case management team for assistance with care management and care coordination. The patient's primary care provider has been notified of our unsuccessful attempts to make or maintain contact with the patient. The care management team is pleased to engage with this patient at any time in the future should he/she be interested in assistance from the care management team.  ? ?Follow Up Plan: A HIPPA compliant phone message was left for the patient providing contact information and requesting a return call.  ? ?Barb Merino, RN, BSN, CCM ?Care Management Coordinator ?[place] [facility]  ?Direct Phone: [phone] ? ? ?

## 2022-01-26 ENCOUNTER — Telehealth: Payer: Self-pay

## 2022-01-26 NOTE — Chronic Care Management (AMB) (Signed)
? ? ?Chronic Care Management ?Pharmacy Assistant  ? ?Name: Jill Bowman  MRN: [id number] DOB: September 14, 1929 ? ?Reason for Encounter: Disease State/ Hypertension ? ?Recent office visits:  ?None ? ?Recent consult visits:  ?None ? ?Hospital visits:  ?None in previous 6 months ? ?Medications: ?Outpatient Encounter Medications as of 01/26/2022  ?Medication Sig  ? acetaminophen (TYLENOL) 500 MG tablet Take 500 mg by mouth every 6 (six) hours as needed for mild pain.  ? acidophilus (RISAQUAD) CAPS capsule Take 1 capsule by mouth daily.  ? amiodarone (PACERONE) 100 MG tablet TAKE ONE TABLET BY MOUTH EVERY DAY ONCE DAILY IN THE MORNING]  ? amLODipine (NORVASC) 5 MG tablet TAKE ONE TABLET BY MOUTH EVERY DAY  ? carboxymethylcellulose (REFRESH PLUS) 0.5 % SOLN Place 1 drop into both eyes 3 (three) times daily as needed (dry eyes).  ? diclofenac sodium (VOLTAREN) 1 % GEL Apply 2 g topically 4 (four) times daily.  ? docusate sodium (COLACE) 100 MG capsule Take 200 mg by mouth at bedtime.  ? donepezil (ARICEPT) 10 MG tablet Take 1 tablet (10 mg total) by mouth at bedtime.  ? gabapentin (NEURONTIN) 300 MG capsule Take 1 capsule (300 mg total) by mouth 2 (two) times daily.  ? lidocaine (XYLOCAINE) 5 % ointment Apply 1 application topically as needed.  ? loratadine (CLARITIN) 10 MG tablet Take 10 mg by mouth daily as needed for allergies.   ? metoprolol succinate (TOPROL-XL) 25 MG 24 hr tablet Take 1 tablet (25 mg total) by mouth daily. Take with or immediately following a meal.  ? nitroGLYCERIN (NITROSTAT) 0.4 MG SL tablet dissolve ONE UNDER THE TONGUE EVERY FIVE minutes FOR not more THAN THREE doses  ? pravastatin (PRAVACHOL) 80 MG tablet Take 1 tablet (80 mg total) by mouth daily.  ? QUEtiapine (SEROQUEL) 50 MG tablet Take 1 tablet (50 mg total) by mouth at bedtime.  ? XARELTO 20 MG TABS tablet TAKE 1 TABLET BY MOUTH ONCE DAILY WITH SUPPER  ? ?No facility-administered encounter medications on file as of 01/26/2022.  ?Reviewed chart  prior to disease state call. Spoke with patient regarding BP ? ?Recent Office Vitals: ?BP Readings from Last 3 Encounters:  ?10/13/21 132/82  ?08/23/21 130/88  ?08/09/21 (!) 145/76  ? ?Pulse Readings from Last 3 Encounters:  ?08/23/21 63  ?08/09/21 80  ?05/26/21 (!) 55  ?  ?Wt Readings from Last 3 Encounters:  ?10/13/21 199 lb (90.3 kg)  ?08/23/21 190 lb 9.6 oz (86.5 kg)  ?08/09/21 193 lb 9.6 oz (87.8 kg)  ?  ? ?Kidney Function ?Lab Results  ?Component Value Date/Time  ? CREATININE 1.29 (H) 08/23/2021 12:54 PM  ? CREATININE 1.20 (H) 05/26/2021 05:17 PM  ? CREATININE 1.1 02/24/2016 09:44 AM  ? GFR 47.30 (L) 06/19/2011 11:40 AM  ? GFRNONAA 43 (L) 05/26/2021 05:17 PM  ? GFRAA 40 (L) 10/07/2020 04:06 PM  ? ? ?BMP Latest Ref Rng & Units 08/23/2021 05/26/2021 04/20/2021  ?Glucose 70 - 99 mg/dL 81 73 85  ?BUN 10 - 36 mg/dL 14 16 11   ?Creatinine 0.57 - 1.00 mg/dL 1.29(H) 1.20(H) 1.20(H)  ?BUN/Creat Ratio 12 - 28 11(L) - 9(L)  ?Sodium 134 - 144 mmol/L 144 139 142  ?Potassium 3.5 - 5.2 mmol/L 4.2 3.9 4.2  ?Chloride 96 - 106 mmol/L 106 107 104  ?CO2 20 - 29 mmol/L 23 25 24   ?Calcium 8.7 - 10.3 mg/dL 8.9 8.7(L) 9.4  ? ? ?Current antihypertensive regimen:  ?Amlodipine 5 mg daily ?Metoprolol Succinate 25 mg  daily ? ?How often are you checking your Blood Pressure? 1-2x per week ? ?Current home BP readings: 112/70 66, 1132/64 65 ? ?What recent interventions/DTPs have been made by any provider to improve Blood Pressure control since last CPP Visit:  ?Educated on Daily salt intake goal < 2300 mg; ?-Counseled to monitor BP at home at least four times per day, document, and provide log at future appointments ?-Recommended to continue current medication ? ?Any recent hospitalizations or ED visits since last visit with CPP? No ? ?What diet changes have been made to improve Blood Pressure Control?  ?Patient's son states patient has a great appetite. Patient's son states patient doesn't use salt, drinks plenty of water and eats  fruits/vegetables. ? ?What exercise is being done to improve your Blood Pressure Control?  ?Patient's son states patient walks around the house. ? ? ?Adherence Review: ?Is the patient currently on ACE/ARB medication? No ?Does the patient have >5 day gap between last estimated fill dates? No ? ?Care Gaps: ?PNA Vac overdue  ?OPHTHALMOLOGY EXAM (Yearly) overdue ?Zoster Vaccines- Shingrix (1 of 2) overdue ?DEXA SCAN (Once) overdue ?FOOT EXAM (Yearly)  overdue ?URINE MICROALBUMIN (Yearly) overdue    ?HEMOGLOBIN A1C (Every 6 Months) overdue ? ?Star Rating Drugs: ?Pravastatin 80 mg- Last filled 12-19-2021 90 DS [place] ? ?Malecca Hicks CMA ?Clinical Pharmacist Assistant ?[phone] ? ?

## 2022-01-31 ENCOUNTER — Telehealth: Payer: Self-pay

## 2022-01-31 NOTE — Chronic Care Management (AMB) (Signed)
? ? ?  Chronic Care Management ?Pharmacy Assistant  ? ?Name: Jill Bowman  MRN: [id number] DOB: 16-Aug-1929 ? ?Reason for Encounter: Reschedule appointment ?  ?Medications: ?Outpatient Encounter Medications as of 01/31/2022  ?Medication Sig  ? acetaminophen (TYLENOL) 500 MG tablet Take 500 mg by mouth every 6 (six) hours as needed for mild pain.  ? acidophilus (RISAQUAD) CAPS capsule Take 1 capsule by mouth daily.  ? amiodarone (PACERONE) 100 MG tablet TAKE ONE TABLET BY MOUTH EVERY DAY ONCE DAILY IN THE MORNING]  ? amLODipine (NORVASC) 5 MG tablet TAKE ONE TABLET BY MOUTH EVERY DAY  ? carboxymethylcellulose (REFRESH PLUS) 0.5 % SOLN Place 1 drop into both eyes 3 (three) times daily as needed (dry eyes).  ? diclofenac sodium (VOLTAREN) 1 % GEL Apply 2 g topically 4 (four) times daily.  ? docusate sodium (COLACE) 100 MG capsule Take 200 mg by mouth at bedtime.  ? donepezil (ARICEPT) 10 MG tablet Take 1 tablet (10 mg total) by mouth at bedtime.  ? gabapentin (NEURONTIN) 300 MG capsule Take 1 capsule (300 mg total) by mouth 2 (two) times daily.  ? lidocaine (XYLOCAINE) 5 % ointment Apply 1 application topically as needed.  ? loratadine (CLARITIN) 10 MG tablet Take 10 mg by mouth daily as needed for allergies.   ? metoprolol succinate (TOPROL-XL) 25 MG 24 hr tablet Take 1 tablet (25 mg total) by mouth daily. Take with or immediately following a meal.  ? nitroGLYCERIN (NITROSTAT) 0.4 MG SL tablet dissolve ONE UNDER THE TONGUE EVERY FIVE minutes FOR not more THAN THREE doses  ? pravastatin (PRAVACHOL) 80 MG tablet Take 1 tablet (80 mg total) by mouth daily.  ? QUEtiapine (SEROQUEL) 50 MG tablet Take 1 tablet (50 mg total) by mouth at bedtime.  ? XARELTO 20 MG TABS tablet TAKE 1 TABLET BY MOUTH ONCE DAILY WITH SUPPER  ? ?No facility-administered encounter medications on file as of 01/31/2022.  ? ?01-31-2022: Left patient's son a voicemail that patient's appointment in May has been rescheduled to August.  ? ?Jeannette How  CMA ?Clinical Pharmacist Assistant ?[phone] ? ?

## 2022-02-02 ENCOUNTER — Ambulatory Visit: Payer: Medicare Other | Admitting: Adult Health

## 2022-02-24 DIAGNOSIS — E782 Mixed hyperlipidemia: Secondary | ICD-10-CM | POA: Diagnosis not present

## 2022-02-24 DIAGNOSIS — I10 Essential (primary) hypertension: Secondary | ICD-10-CM

## 2022-02-24 DIAGNOSIS — F0280 Dementia in other diseases classified elsewhere without behavioral disturbance: Secondary | ICD-10-CM | POA: Diagnosis not present

## 2022-02-24 DIAGNOSIS — G301 Alzheimer's disease with late onset: Secondary | ICD-10-CM | POA: Diagnosis not present

## 2022-03-09 ENCOUNTER — Encounter: Payer: Self-pay | Admitting: Cardiology

## 2022-03-20 ENCOUNTER — Other Ambulatory Visit: Payer: Self-pay | Admitting: Neurology

## 2022-03-20 ENCOUNTER — Other Ambulatory Visit: Payer: Self-pay | Admitting: Cardiology

## 2022-03-20 DIAGNOSIS — I495 Sick sinus syndrome: Secondary | ICD-10-CM

## 2022-03-21 NOTE — Telephone Encounter (Signed)
Okay to refill amiodarone?

## 2022-04-12 ENCOUNTER — Other Ambulatory Visit: Payer: Self-pay | Admitting: Neurology

## 2022-04-13 ENCOUNTER — Telehealth: Payer: Medicare Other

## 2022-04-14 ENCOUNTER — Telehealth: Payer: Medicare Other

## 2022-04-14 ENCOUNTER — Other Ambulatory Visit: Payer: Self-pay | Admitting: Nurse Practitioner

## 2022-04-21 ENCOUNTER — Other Ambulatory Visit: Payer: Self-pay | Admitting: Adult Health

## 2022-04-28 ENCOUNTER — Telehealth: Payer: Self-pay

## 2022-04-28 NOTE — Chronic Care Management (AMB) (Signed)
Chronic Care Management Pharmacy Assistant   Name: Jill Bowman  MRN: [id number] DOB: January 10, 1929  Reason for Encounter: Disease State/ Hypertension   Recent office visits:  None  Recent consult visits:  None  Hospital visits:  None in previous 6 months  Medications: Outpatient Encounter Medications as of 04/28/2022  Medication Sig   acetaminophen (TYLENOL) 500 MG tablet Take 500 mg by mouth every 6 (six) hours as needed for mild pain.   acidophilus (RISAQUAD) CAPS capsule Take 1 capsule by mouth daily.   amiodarone (PACERONE) 100 MG tablet TAKE ONE TABLET BY MOUTH ONCE DAILY IN THE MORNING   amLODipine (NORVASC) 5 MG tablet TAKE ONE TABLET BY MOUTH EVERY DAY   carboxymethylcellulose (REFRESH PLUS) 0.5 % SOLN Place 1 drop into both eyes 3 (three) times daily as needed (dry eyes).   diclofenac sodium (VOLTAREN) 1 % GEL Apply 2 g topically 4 (four) times daily.   docusate sodium (COLACE) 100 MG capsule Take 200 mg by mouth at bedtime.   donepezil (ARICEPT) 10 MG tablet Take 1 tablet (10 mg total) by mouth at bedtime. Please call and make overdue appt for further refills, 1 st attempt   gabapentin (NEURONTIN) 300 MG capsule Take 1 capsule (300 mg total) by mouth 2 (two) times daily.   lidocaine (XYLOCAINE) 5 % ointment Apply 1 application topically as needed.   loratadine (CLARITIN) 10 MG tablet Take 10 mg by mouth daily as needed for allergies.    metoprolol succinate (TOPROL-XL) 25 MG 24 hr tablet Take 1 tablet (25 mg total) by mouth daily. Take with or immediately following a meal.   nitroGLYCERIN (NITROSTAT) 0.4 MG SL tablet dissolve ONE UNDER THE TONGUE EVERY FIVE minutes FOR not more THAN THREE doses   pravastatin (PRAVACHOL) 80 MG tablet Take 1 tablet (80 mg total) by mouth daily.   QUEtiapine (SEROQUEL) 50 MG tablet Take 1 tablet (50 mg total) by mouth at bedtime.   XARELTO 20 MG TABS tablet TAKE 1 TABLET BY MOUTH ONCE DAILY WITH SUPPER   No facility-administered  encounter medications on file as of 04/28/2022.  Reviewed chart prior to disease state call. Spoke with patient regarding BP  Recent Office Vitals: BP Readings from Last 3 Encounters:  10/13/21 132/82  08/23/21 130/88  08/09/21 (!) 145/76   Pulse Readings from Last 3 Encounters:  08/23/21 63  08/09/21 80  05/26/21 (!) 55    Wt Readings from Last 3 Encounters:  10/13/21 199 lb (90.3 kg)  08/23/21 190 lb 9.6 oz (86.5 kg)  08/09/21 193 lb 9.6 oz (87.8 kg)     Kidney Function Lab Results  Component Value Date/Time   CREATININE 1.29 (H) 08/23/2021 12:54 PM   CREATININE 1.20 (H) 05/26/2021 05:17 PM   CREATININE 1.1 02/24/2016 09:44 AM   GFR 47.30 (L) 06/19/2011 11:40 AM   GFRNONAA 43 (L) 05/26/2021 05:17 PM   GFRAA 40 (L) 10/07/2020 04:06 PM       Latest Ref Rng & Units 08/23/2021   12:54 PM 05/26/2021    5:17 PM 04/20/2021    9:22 AM  BMP  Glucose 70 - 99 mg/dL 81   73   85    BUN 10 - 36 mg/dL '14   16   11    '$ Creatinine 0.57 - 1.00 mg/dL 1.29   1.20   1.20    BUN/Creat Ratio 12 - '28 11    9    '$ Sodium 134 - 144 mmol/L 144  139   142    Potassium 3.5 - 5.2 mmol/L 4.2   3.9   4.2    Chloride 96 - 106 mmol/L 106   107   104    CO2 20 - 29 mmol/L '23   25   24    '$ Calcium 8.7 - 10.3 mg/dL 8.9   8.7   9.4      Current antihypertensive regimen:  Amlodipine 5 mg daily Metoprolol Succinate 25 mg daily  How often are you checking your Blood Pressure? 1-2x per week  Current home BP readings: 120/69 70, 118/72 65  What recent interventions/DTPs have been made by any provider to improve Blood Pressure control since last CPP Visit:  Educated on Daily salt intake goal < 2300 mg; -Counseled to monitor BP at home at least four times per day, document, and provide log at future appointments -Recommended to continue current medication  Any recent hospitalizations or ED visits since last visit with CPP? No  What diet changes have been made to improve Blood Pressure Control?   Patient's son states patient has a great appetite. Patient's son states patient doesn't use salt, drinks plenty of water and eats fruits/vegetables.  What exercise is being done to improve your Blood Pressure Control?  Patient's son states patient walks around the house.  Adherence Review: Is the patient currently on ACE/ARB medication? No Does the patient have >5 day gap between last estimated fill dates? No   Care Gaps: PNA Vac overdue  OPHTHALMOLOGY EXAM (Yearly) overdue Zoster Vaccines- Shingrix (1 of 2) overdue DEXA SCAN (Once) overdue FOOT EXAM (Yearly)  overdue URINE MICROALBUMIN (Yearly) overdue    HEMOGLOBIN A1C (Every 6 Months) overdue AWV 11-08-2022  Star Rating Drugs: Pravastatin 80 mg- Last filled 03-20-2022 90 DS [place] Clinical Pharmacist Assistant [phone]

## 2022-05-16 ENCOUNTER — Other Ambulatory Visit: Payer: Self-pay | Admitting: Adult Health

## 2022-05-16 NOTE — Telephone Encounter (Signed)
This pt needs appt scheduled for refills.

## 2022-05-16 NOTE — Telephone Encounter (Signed)
Phone rep called and spoke with son(on DPR) he confirmed pt is currently in [place](date of return in question at this time).  Pt's son declined a my chart vv. Son confirmed that he has taken pt to a Neurologist in [place] and will see if that provider will take care of the donepezil (ARICEPT) 10 MG tablet.  This is FYI for POD 4

## 2022-05-23 ENCOUNTER — Other Ambulatory Visit: Payer: Self-pay | Admitting: Adult Health

## 2022-06-01 ENCOUNTER — Other Ambulatory Visit: Payer: Self-pay | Admitting: Cardiology

## 2022-06-01 DIAGNOSIS — E7800 Pure hypercholesterolemia, unspecified: Secondary | ICD-10-CM

## 2022-06-13 ENCOUNTER — Other Ambulatory Visit: Payer: Self-pay | Admitting: Adult Health

## 2022-06-14 ENCOUNTER — Other Ambulatory Visit: Payer: Self-pay | Admitting: Adult Health

## 2022-07-10 ENCOUNTER — Other Ambulatory Visit: Payer: Self-pay | Admitting: Cardiology

## 2022-07-10 DIAGNOSIS — I10 Essential (primary) hypertension: Secondary | ICD-10-CM

## 2022-07-18 ENCOUNTER — Telehealth: Payer: Medicare Other

## 2022-08-09 ENCOUNTER — Ambulatory Visit: Payer: Medicare Other | Admitting: Cardiology

## 2022-09-11 ENCOUNTER — Encounter: Payer: Self-pay | Admitting: Cardiology

## 2022-09-11 ENCOUNTER — Ambulatory Visit: Payer: Medicare Other | Admitting: Cardiology

## 2022-09-11 VITALS — BP 117/48 | HR 57 | Temp 97.1°F | Resp 16 | Ht 67.0 in | Wt 184.8 lb

## 2022-09-11 DIAGNOSIS — I495 Sick sinus syndrome: Secondary | ICD-10-CM

## 2022-09-11 DIAGNOSIS — I10 Essential (primary) hypertension: Secondary | ICD-10-CM

## 2022-09-11 DIAGNOSIS — Z950 Presence of cardiac pacemaker: Secondary | ICD-10-CM
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
## 2022-09-26 ENCOUNTER — Other Ambulatory Visit: Payer: Self-pay | Admitting: Neurology

## 2022-09-26 ENCOUNTER — Other Ambulatory Visit: Payer: Self-pay | Admitting: Cardiology

## 2022-09-26 NOTE — Telephone Encounter (Signed)
Refill request

## 2022-10-11 ENCOUNTER — Other Ambulatory Visit: Payer: Self-pay | Admitting: Nurse Practitioner

## 2022-11-08 ENCOUNTER — Ambulatory Visit: Payer: Medicare Other

## 2022-11-08 ENCOUNTER — Ambulatory Visit: Payer: Medicare Other | Admitting: Nurse Practitioner

## 2022-11-15 ENCOUNTER — Telehealth: Payer: Self-pay | Admitting: Nurse Practitioner

## 2022-11-15 NOTE — Telephone Encounter (Signed)
Left message for patient to call back and schedule Medicare Annual Wellness Visit (AWV) either virtually or in office. Left  my Herbie Drape number [phone]   Last AWV 10/13/21 please schedule with Nurse Health Adviser   45 min for awv-i and in office appointments 30 min for awv-s  phone/virtual appointments

## 2022-11-24 ENCOUNTER — Other Ambulatory Visit: Payer: Self-pay | Admitting: Cardiology

## 2022-11-24 DIAGNOSIS — I4892 Unspecified atrial flutter: Secondary | ICD-10-CM

## 2022-12-05 ENCOUNTER — Telehealth: Payer: Self-pay | Admitting: Nurse Practitioner

## 2022-12-05 NOTE — Telephone Encounter (Signed)
Left message for patient to call back and schedule Medicare Annual Wellness Visit (AWV) either virtually or in office. Left  my Jill Bowman number [phone]   Last AWV 10/13/21 please schedule with Nurse Health Adviser   45 min for awv-i  in office appointments 30 min for awv-s & awv-i phone/virtual appointments

## 2023-01-02 ENCOUNTER — Telehealth: Payer: Self-pay | Admitting: Nurse Practitioner

## 2023-01-02 NOTE — Telephone Encounter (Signed)
Left message for patient to call back and schedule Medicare Annual Wellness Visit (AWV) either virtually or in office. Left  my Herbie Drape number [phone]   Last AWV 10/13/21 please schedule with Nurse Health Adviser   45 min for awv-i  in office appointments 30 min for awv-s & awv-i phone/virtual appointments

## 2023-03-14 ENCOUNTER — Telehealth: Payer: Self-pay | Admitting: Nurse Practitioner

## 2023-03-14 NOTE — Telephone Encounter (Signed)
Called patient to schedule Medicare Annual Wellness Visit (AWV). Left message for patient to call back and schedule Medicare Annual Wellness Visit (AWV).  Last date of AWV: 10/13/21  Please schedule an appointment at any time with [facility].  If any questions, please contact me at [phone].  Thank you ,  Rudell Cobb AWV direct phone # [phone]

## 2023-04-16 ENCOUNTER — Ambulatory Visit: Payer: Medicare Other | Admitting: Nurse Practitioner

## 2023-04-27 ENCOUNTER — Inpatient Hospital Stay (HOSPITAL_BASED_OUTPATIENT_CLINIC_OR_DEPARTMENT_OTHER)
Admission: EM | Admit: 2023-04-27 | Discharge: 2023-05-01 | DRG: 592 | Disposition: A | Discharge status: Hospice | Payer: Medicare Other | Attending: Internal Medicine | Admitting: Internal Medicine

## 2023-04-27 ENCOUNTER — Emergency Department (HOSPITAL_BASED_OUTPATIENT_CLINIC_OR_DEPARTMENT_OTHER): Payer: Medicare Other

## 2023-04-27 ENCOUNTER — Other Ambulatory Visit: Payer: Self-pay

## 2023-04-27 ENCOUNTER — Encounter (HOSPITAL_BASED_OUTPATIENT_CLINIC_OR_DEPARTMENT_OTHER): Payer: Self-pay

## 2023-04-27 DIAGNOSIS — M19012 Primary osteoarthritis, left shoulder: Secondary | ICD-10-CM | POA: Diagnosis present

## 2023-04-27 DIAGNOSIS — R5381 Other malaise: Secondary | ICD-10-CM | POA: Diagnosis present

## 2023-04-27 DIAGNOSIS — N1831 Chronic kidney disease, stage 3a: Secondary | ICD-10-CM | POA: Diagnosis not present

## 2023-04-27 DIAGNOSIS — G301 Alzheimer's disease with late onset: Secondary | ICD-10-CM | POA: Diagnosis not present

## 2023-04-27 DIAGNOSIS — I129 Hypertensive chronic kidney disease with stage 1 through stage 4 chronic kidney disease, or unspecified chronic kidney disease: Secondary | ICD-10-CM | POA: Diagnosis not present

## 2023-04-27 DIAGNOSIS — D649 Anemia, unspecified: Secondary | ICD-10-CM

## 2023-04-27 DIAGNOSIS — Z885 Allergy status to narcotic agent status: Secondary | ICD-10-CM

## 2023-04-27 DIAGNOSIS — M19071 Primary osteoarthritis, right ankle and foot: Secondary | ICD-10-CM | POA: Diagnosis present

## 2023-04-27 DIAGNOSIS — M19011 Primary osteoarthritis, right shoulder: Secondary | ICD-10-CM | POA: Diagnosis present

## 2023-04-27 DIAGNOSIS — M19072 Primary osteoarthritis, left ankle and foot: Secondary | ICD-10-CM | POA: Diagnosis present

## 2023-04-27 DIAGNOSIS — G9341 Metabolic encephalopathy: Secondary | ICD-10-CM | POA: Diagnosis not present

## 2023-04-27 DIAGNOSIS — S31000A Unspecified open wound of lower back and pelvis without penetration into retroperitoneum, initial encounter: Secondary | ICD-10-CM | POA: Diagnosis not present

## 2023-04-27 DIAGNOSIS — K219 Gastro-esophageal reflux disease without esophagitis: Secondary | ICD-10-CM | POA: Diagnosis present

## 2023-04-27 DIAGNOSIS — I482 Chronic atrial fibrillation, unspecified: Secondary | ICD-10-CM | POA: Diagnosis present

## 2023-04-27 DIAGNOSIS — I495 Sick sinus syndrome: Secondary | ICD-10-CM | POA: Diagnosis present

## 2023-04-27 DIAGNOSIS — Z66 Do not resuscitate: Secondary | ICD-10-CM | POA: Diagnosis not present

## 2023-04-27 DIAGNOSIS — N390 Urinary tract infection, site not specified: Secondary | ICD-10-CM | POA: Diagnosis present

## 2023-04-27 DIAGNOSIS — Z955 Presence of coronary angioplasty implant and graft: Secondary | ICD-10-CM

## 2023-04-27 DIAGNOSIS — Z880 Allergy status to penicillin: Secondary | ICD-10-CM

## 2023-04-27 DIAGNOSIS — Z7901 Long term (current) use of anticoagulants: Secondary | ICD-10-CM

## 2023-04-27 DIAGNOSIS — D631 Anemia in chronic kidney disease: Secondary | ICD-10-CM | POA: Diagnosis present

## 2023-04-27 DIAGNOSIS — Z7982 Long term (current) use of aspirin: Secondary | ICD-10-CM

## 2023-04-27 DIAGNOSIS — Z8249 Family history of ischemic heart disease and other diseases of the circulatory system: Secondary | ICD-10-CM

## 2023-04-27 DIAGNOSIS — L89150 Pressure ulcer of sacral region, unstageable: Secondary | ICD-10-CM | POA: Diagnosis not present

## 2023-04-27 DIAGNOSIS — Z853 Personal history of malignant neoplasm of breast: Secondary | ICD-10-CM

## 2023-04-27 DIAGNOSIS — F0280 Dementia in other diseases classified elsewhere without behavioral disturbance: Secondary | ICD-10-CM | POA: Diagnosis present

## 2023-04-27 DIAGNOSIS — Z91040 Latex allergy status: Secondary | ICD-10-CM

## 2023-04-27 DIAGNOSIS — Z801 Family history of malignant neoplasm of trachea, bronchus and lung: Secondary | ICD-10-CM | POA: Diagnosis not present

## 2023-04-27 DIAGNOSIS — Z950 Presence of cardiac pacemaker: Secondary | ICD-10-CM

## 2023-04-27 DIAGNOSIS — E1122 Type 2 diabetes mellitus with diabetic chronic kidney disease: Secondary | ICD-10-CM | POA: Diagnosis not present

## 2023-04-27 DIAGNOSIS — F02811 Dementia in other diseases classified elsewhere, unspecified severity, with agitation: Secondary | ICD-10-CM | POA: Diagnosis not present

## 2023-04-27 DIAGNOSIS — E785 Hyperlipidemia, unspecified: Secondary | ICD-10-CM | POA: Diagnosis present

## 2023-04-27 DIAGNOSIS — Z881 Allergy status to other antibiotic agents status: Secondary | ICD-10-CM | POA: Diagnosis not present

## 2023-04-27 DIAGNOSIS — Z7401 Bed confinement status: Secondary | ICD-10-CM

## 2023-04-27 DIAGNOSIS — N183 Chronic kidney disease, stage 3 unspecified: Secondary | ICD-10-CM | POA: Diagnosis present

## 2023-04-27 DIAGNOSIS — Z87891 Personal history of nicotine dependence: Secondary | ICD-10-CM | POA: Diagnosis not present

## 2023-04-27 DIAGNOSIS — Z515 Encounter for palliative care: Secondary | ICD-10-CM

## 2023-04-27 DIAGNOSIS — M25571 Pain in right ankle and joints of right foot: Secondary | ICD-10-CM

## 2023-04-27 DIAGNOSIS — Z79899 Other long term (current) drug therapy: Secondary | ICD-10-CM

## 2023-04-27 DIAGNOSIS — N3090 Cystitis, unspecified without hematuria: Principal | ICD-10-CM

## 2023-04-27 DIAGNOSIS — Z882 Allergy status to sulfonamides status: Secondary | ICD-10-CM

## 2023-04-27 DIAGNOSIS — L98421 Non-pressure chronic ulcer of back limited to breakdown of skin: Secondary | ICD-10-CM

## 2023-04-27 DIAGNOSIS — I2510 Atherosclerotic heart disease of native coronary artery without angina pectoris: Secondary | ICD-10-CM | POA: Diagnosis present

## 2023-04-27 DIAGNOSIS — Z96653 Presence of artificial knee joint, bilateral: Secondary | ICD-10-CM | POA: Diagnosis present

## 2023-04-27 DIAGNOSIS — F32A Depression, unspecified: Secondary | ICD-10-CM | POA: Diagnosis present

## 2023-04-27 DIAGNOSIS — E119 Type 2 diabetes mellitus without complications: Secondary | ICD-10-CM

## 2023-04-27 DIAGNOSIS — Z888 Allergy status to other drugs, medicaments and biological substances status: Secondary | ICD-10-CM

## 2023-04-27 LAB — COMPREHENSIVE METABOLIC PANEL
ALT: 5 U/L (ref 0–44)
AST: 17 U/L (ref 15–41)
Albumin: 3.2 g/dL — ABNORMAL LOW (ref 3.5–5.0)
Alkaline Phosphatase: 89 U/L (ref 38–126)
Anion gap: 8 (ref 5–15)
BUN: 17 mg/dL (ref 8–23)
CO2: 27 mmol/L (ref 22–32)
Calcium: 8.3 mg/dL — ABNORMAL LOW (ref 8.9–10.3)
Chloride: 107 mmol/L (ref 98–111)
Creatinine, Ser: 0.88 mg/dL (ref 0.44–1.00)
GFR, Estimated: >60 mL/min (ref >60)
Glucose, Bld: 110 mg/dL — ABNORMAL HIGH (ref 70–99)
Potassium: 4.2 mmol/L (ref 3.5–5.1)
Sodium: 142 mmol/L (ref 135–145)
Total Bilirubin: 0.3 mg/dL (ref 0.3–1.2)
Total Protein: 6.7 g/dL (ref 6.5–8.1)

## 2023-04-27 LAB — CBC WITH DIFFERENTIAL/PLATELET
Abs Immature Granulocytes: 0.03 10*3/uL (ref 0.00–0.07)
Basophils Absolute: 0 10*3/uL (ref 0.0–0.1)
Basophils Relative: 0 %
Eosinophils Absolute: 0.3 10*3/uL (ref 0.0–0.5)
Eosinophils Relative: 3 %
HCT: 31.9 % — ABNORMAL LOW (ref 36.0–46.0)
Hemoglobin: 9.8 g/dL — ABNORMAL LOW (ref 12.0–15.0)
Immature Granulocytes: 0 %
Lymphocytes Relative: 22 %
Lymphs Abs: 1.8 10*3/uL (ref 0.7–4.0)
MCH: 28.8 pg (ref 26.0–34.0)
MCHC: 30.7 g/dL (ref 30.0–36.0)
MCV: 93.8 fL (ref 80.0–100.0)
Monocytes Absolute: 0.6 10*3/uL (ref 0.1–1.0)
Monocytes Relative: 8 %
Neutro Abs: 5.5 10*3/uL (ref 1.7–7.7)
Neutrophils Relative %: 67 %
Platelets: 278 10*3/uL (ref 150–400)
RBC: 3.4 MIL/uL — ABNORMAL LOW (ref 3.87–5.11)
RDW: 14.6 % (ref 11.5–15.5)
WBC: 8.3 10*3/uL (ref 4.0–10.5)
nRBC: 0 % (ref 0.0–0.2)

## 2023-04-27 LAB — URINALYSIS, ROUTINE W REFLEX MICROSCOPIC
Bilirubin Urine: NEGATIVE
Glucose, UA: NEGATIVE mg/dL
Hgb urine dipstick: NEGATIVE
Ketones, ur: NEGATIVE mg/dL
Nitrite: POSITIVE — AB
Specific Gravity, Urine: >1.046 — ABNORMAL HIGH (ref 1.005–1.030)
pH: 7 (ref 5.0–8.0)

## 2023-04-27 LAB — HEMOGLOBIN AND HEMATOCRIT, BLOOD
HCT: 30.1 % — ABNORMAL LOW (ref 36.0–46.0)
Hemoglobin: 9.3 g/dL — ABNORMAL LOW (ref 12.0–15.0)

## 2023-04-27 LAB — LIPASE, BLOOD: Lipase: 20 U/L (ref 11–51)

## 2023-04-27 LAB — PROTIME-INR
INR: 2.1 — ABNORMAL HIGH (ref 0.8–1.2)
Prothrombin Time: 23.8 seconds — ABNORMAL HIGH (ref 11.4–15.2)

## 2023-04-27 LAB — OCCULT BLOOD X 1 CARD TO LAB, STOOL: Fecal Occult Bld: NEGATIVE

## 2023-04-27 MED ORDER — IOHEXOL 300 MG/ML  SOLN
100.0000 mL | Freq: Once | INTRAMUSCULAR | Status: AC | PRN
  Administered 2023-04-27: 80 mL via INTRAVENOUS

## 2023-04-27 MED ORDER — LACTATED RINGERS IV BOLUS
500.0000 mL | Freq: Once | INTRAVENOUS | Status: AC
  Administered 2023-04-27: 500 mL via INTRAVENOUS

## 2023-04-27 MED ORDER — SODIUM CHLORIDE 0.9 % IV SOLN
1.0000 g | Freq: Once | INTRAVENOUS | Status: AC
  Administered 2023-04-27: 1 g via INTRAVENOUS
  Filled 2023-04-27: qty 10

## 2023-04-27 NOTE — Progress Notes (Signed)
89 y.o. AAF with CAD with Cx stent in 2005 in [place], SSS s/p pacemaker implantation, HTN, dementia, morbid obesity, and atrial fibrillation.  She was brought to the ED by her daughter as the patient has been more confused compared to baseline.  The daughter also noted blood in the bowl after patient had a BM.  Patient anticoagulated on Xarelto.  In the ER patient noted to have a UTI and a stage I sacral decub with erythematous base.  Admission requested for altered mentation due to UTI.

## 2023-04-27 NOTE — ED Notes (Signed)
Patient transported to CT 

## 2023-04-27 NOTE — ED Notes (Signed)
Called [facility] for transport spoke with Kiley 

## 2023-04-27 NOTE — ED Provider Notes (Cosign Needed Addendum)
[place] EMERGENCY DEPARTMENT AT [facility] Provider Note   CSN: [id number] Arrival date & time: 04/27/23  1456     History  Chief Complaint  Patient presents with   Rectal Bleeding    Jill Bowman is a 89 y.o. female with PMH CAD, asthma, anemia, arrhythmia status post pacemaker, dementia who is brought to ED by daughter for evaluation.  Daughter states that patient has been staying with other siblings although daughter recovered from shoulder surgery. Daughter assumed care of patient yesterday. Noted she had sacral wound at this time. This morning she says pt went to have a bowel movement on the toilet and daughter noticed a lot of blood in the toilet. Also says pt has been c/o abdominal pain intermittently. Pt is anticoagulated on Xarelto. Daughter has home health nurse who is going to assist with care of pt. Daughter does not want pt to go to nursing facility. Daughter also notes pt seems to have some mild right ankle swelling and has a history of previous ankle surgery. No known falls or injuries. I      Home Medications Prior to Admission medications   Medication Sig Start Date End Date Taking? Authorizing Provider  acetaminophen (TYLENOL) 500 MG tablet Take 500 mg by mouth every 6 (six) hours as needed for mild pain.    [provider]  acidophilus (RISAQUAD) CAPS capsule Take 1 capsule by mouth daily. 10/23/14   Rhetta Mura, MD  amiodarone (PACERONE) 100 MG tablet TAKE ONE TABLET BY MOUTH EVERY MORNING 09/26/22   Yates Decamp, MD  amLODipine (NORVASC) 5 MG tablet TAKE ONE TABLET BY MOUTH EVERY DAY 07/10/22   Yates Decamp, MD  ASPIRIN 81 PO Take 1 tablet by mouth daily.    [provider]  carboxymethylcellulose (REFRESH PLUS) 0.5 % SOLN Place 1 drop into both eyes 3 (three) times daily as needed (dry eyes).    [provider]  Cholecalciferol (VITAMIN D3) 50 MCG (2000 UT) capsule Take 2,000 Units by mouth daily.    [provider]  docusate sodium (COLACE) 100 MG capsule Take 200 mg by mouth at bedtime.    [provider]  donepezil (ARICEPT) 10 MG tablet Take 1 tablet (10 mg total) by mouth at bedtime. Please call and make overdue appt for further refills, 1 st attempt 06/14/22   Butch Penny, NP  famotidine (PEPCID) 20 MG tablet Take 20 mg by mouth 2 (two) times daily.    [provider]  gabapentin (NEURONTIN) 300 MG capsule TAKE ONE CAPSULE BY MOUTH TWICE DAILY 10/11/22   Arnette Felts, FNP  loratadine (CLARITIN) 10 MG tablet Take 10 mg by mouth daily as needed for allergies.     [provider]  metoprolol succinate (TOPROL-XL) 25 MG 24 hr tablet Take 1 tablet (25 mg total) by mouth daily. Take with or immediately following a meal. 03/21/22 09/17/22  Yates Decamp, MD  nitroGLYCERIN (NITROSTAT) 0.4 MG SL tablet dissolve ONE UNDER THE TONGUE EVERY FIVE minutes FOR not more THAN THREE doses 09/23/20   Yates Decamp, MD  pravastatin (PRAVACHOL) 80 MG tablet Take 1 tablet (80 mg total) by mouth daily. 06/01/22   Yates Decamp, MD  QUEtiapine (SEROQUEL) 50 MG tablet TAKE ONE TABLET BY MOUTH ONCE AT BEDTIME 09/26/22   Dohmeier, Porfirio Mylar, MD  rivaroxaban (XARELTO) 20 MG TABS tablet TAKE 1 TABLET BY MOUTH ONCE DAILY WITH SUPPER 11/24/22   Yates Decamp, MD      Allergies    Codeine,  Hydroxyzine, Lorazepam, Penicillins, Sulfa antibiotics, Zinc, Ciprofloxacin, Ciprofloxacin, Latex, Penicillins, and Sulfa antibiotics    Review of Systems   Review of Systems  All other systems reviewed and are negative.   Physical Exam Updated Vital Signs BP (!) 132/95   Pulse 63   Temp 98.7 F (37.1 C) (Oral)   Resp 20   SpO2 100%  Physical Exam Vitals and nursing note reviewed.  Constitutional:      General: She is not in acute distress.    Appearance: She is not ill-appearing or toxic-appearing.  HENT:     Head: Normocephalic and atraumatic.     Mouth/Throat:     Mouth: Mucous membranes are moist.   Eyes:     General: No scleral icterus.    Extraocular Movements: Extraocular movements intact.  Cardiovascular:     Rate and Rhythm: Normal rate and regular rhythm.     Heart sounds: No murmur heard. Pulmonary:     Effort: Pulmonary effort is normal. No respiratory distress.     Breath sounds: Normal breath sounds. No stridor. No wheezing, rhonchi or rales.  Abdominal:     General: Abdomen is flat. There is no distension.     Palpations: Abdomen is soft.     Tenderness: There is no abdominal tenderness. There is no right CVA tenderness, left CVA tenderness, guarding or rebound.  Genitourinary:    Comments: Performed with Venegas RN as chaperone, see wound description below, large amounts of brown stool in diaper, rectal vault, no evidence of hemorrhoids, no gross bleeding or blood streaks in stool Musculoskeletal:        General: No deformity.     Cervical back: Normal range of motion and neck supple. No rigidity.     Comments: Moving all 4 extremities  Skin:    General: Skin is warm and dry.     Coloration: Skin is not jaundiced.     Comments: Grade 2 sacral ulcer with erythematous base, no foul smell, no purulent or other significant drainage noted, no streaking erythema, localized tenderness to palpation  Neurological:     Mental Status: Mental status is at baseline.     ED Results / Procedures / Treatments   Labs (all labs ordered are listed, but only abnormal results are displayed) Labs Reviewed  CBC WITH DIFFERENTIAL/PLATELET - Abnormal; Notable for the following components:      Result Value   RBC 3.40 (*)    Hemoglobin 9.8 (*)    HCT 31.9 (*)    All other components within normal limits  COMPREHENSIVE METABOLIC PANEL - Abnormal; Notable for the following components:   Glucose, Bld 110 (*)    Calcium 8.3 (*)    Albumin 3.2 (*)    All other components within normal limits  URINALYSIS, ROUTINE W REFLEX MICROSCOPIC - Abnormal; Notable for the following components:    Specific Gravity, Urine >1.046 (*)    Protein, ur TRACE (*)    Nitrite POSITIVE (*)    Leukocytes,Ua SMALL (*)    Bacteria, UA RARE (*)    All other components within normal limits  PROTIME-INR - Abnormal; Notable for the following components:   Prothrombin Time 23.8 (*)    INR 2.1 (*)    All other components within normal limits  HEMOGLOBIN AND HEMATOCRIT, BLOOD - Abnormal; Notable for the following components:   Hemoglobin 9.3 (*)    HCT 30.1 (*)    All other components within normal limits  OCCULT BLOOD X 1 CARD TO  LAB, STOOL  LIPASE, BLOOD    EKG None  Radiology CT ABDOMEN PELVIS W CONTRAST  Result Date: 04/27/2023 CLINICAL DATA:  Acute nonlocalized abdominal pain. Possible GI bleed. Sacral wound. EXAM: CT ABDOMEN AND PELVIS WITH CONTRAST TECHNIQUE: Multidetector CT imaging of the abdomen and pelvis was performed using the standard protocol following bolus administration of intravenous contrast. RADIATION DOSE REDUCTION: This exam was performed according to the departmental dose-optimization program which includes automated exposure control, adjustment of the mA and/or kV according to patient size and/or use of iterative reconstruction technique. CONTRAST:  80mL OMNIPAQUE IOHEXOL 300 MG/ML  SOLN COMPARISON:  Jun 24, 2021 FINDINGS: Lower chest: Large esophageal hiatal hernia. Small left pleural effusion with some basilar atelectasis. Hepatobiliary: No focal liver abnormality is seen. Status post cholecystectomy. No biliary dilatation. Pancreas: Unremarkable. No pancreatic ductal dilatation or surrounding inflammatory changes. Spleen: Normal in size without focal abnormality. Adrenals/Urinary Tract: No adrenal gland nodules. Kidneys are symmetrical. Nephrograms are homogeneous. No hydronephrosis or hydroureter. Bladder is decompressed with otherwise normal appearance. Stomach/Bowel: Stomach, small bowel, and colon are not abnormally distended. No wall thickening or inflammatory changes are  appreciated. Prominent diverticulosis of the sigmoid colon. No definite evidence of acute diverticulitis. Vascular/Lymphatic: Extensive calcification of the aorta and branch vessels. No aneurysm. No significant lymphadenopathy. Reproductive: Uterus appears to be surgically absent. Small cystic structure in the left adnexum measuring 2.5 cm diameter likely representing an ovarian cyst. No change in appearance since prior study. Other: No free air or free fluid in the abdomen. Abdominal wall musculature appears intact. Musculoskeletal: Skin defect and subcutaneous soft tissue infiltration over the mid sacrum. This is consistent with sacral decubitus ulceration. Focal increased density within the defect may represent packing material. No definitive bone destruction to suggest osteomyelitis. No loculated collection. Prominent degenerative changes in the spine. Postoperative changes in both femurs. IMPRESSION: 1. No acute process demonstrated in the abdomen or pelvis. No evidence of bowel obstruction or inflammation. Diverticulosis of the sigmoid colon without obvious evidence of acute diverticulitis. 2. Left ovarian simple-appearing cyst measuring 2.5 cm. No follow-up imaging is recommended. Reference: JACR 2020 Feb;17(2):248-254 3. Soft tissue infiltration and skin defect over the sacrum consistent with sacral decubitus ulceration. No loculated collection or definite bone erosion. Electronically Signed   By: Burman Nieves M.D.   On: 04/27/2023 18:05   DG Ankle Right Port  Result Date: 04/27/2023 CLINICAL DATA:  Right ankle swelling EXAM: PORTABLE RIGHT ANKLE - 2 VIEW COMPARISON:  05/02/2013 FINDINGS: ORIF of distal tibial and fibular fractures has been performed with ankylosis of the distal tibia and fibula. 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 Osseous structures are diffusely osteopenic. No acute fracture or dislocation. Dense vascular calcifications are noted. Mild  bimalleolar soft tissue swelling noted, medial greater than lateral. No ankle effusion. IMPRESSION: 1. Bimalleolar soft tissue swelling. No acute fracture or dislocation. 2. 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 Electronically Signed   By: Helyn Numbers M.D.   On: 04/27/2023 16:23   DG Chest Port 1 View  Result Date: 04/27/2023 CLINICAL DATA:  Edema right ankle EXAM: PORTABLE CHEST 1 VIEW COMPARISON:  Previous studies including the CT done on June 24, 2021 FINDINGS: Cardiac size is within normal limits. There are no signs of pulmonary edema. Linear densities are seen in left lower lung fields suggesting scarring or subsegmental atelectasis. There is blunting of both lateral CP angles. There is no pneumothorax. Pacemaker battery is seen in the left infraclavicular region. IMPRESSION: There are no signs of  pulmonary edema. Linear densities in left lower lung field may suggest scarring or subsegmental atelectasis. Blunting of both lateral CP angles may suggest small bilateral pleural effusions or pleural thickening. Electronically Signed   By: Ernie Avena M.D.   On: 04/27/2023 16:23    Procedures Procedures    Medications Ordered in ED Medications  iohexol (OMNIPAQUE) 300 MG/ML solution 100 mL (80 mLs Intravenous Contrast Given 04/27/23 1712)  lactated ringers bolus 500 mL (500 mLs Intravenous New Bag/Given 04/27/23 1854)    ED Course/ Medical Decision Making/ A&P                             Medical Decision Making Amount and/or Complexity of Data Reviewed Labs: ordered. Decision-making details documented in ED Course. Radiology: ordered. Decision-making details documented in ED Course. ECG/medicine tests: ordered. Decision-making details documented in ED Course.  Risk Prescription drug management. Decision regarding hospitalization.   Medical Decision Making:   Kielyn Dudziak is a 89 y.o. female who presented to the ED today with abdominal  pain detailed above.    Additional history discussed with patient's family/caregivers.  Patient's presentation is complicated by their history of advanced age, multiple comorbidities, anticoagulation, dementia.  Complete initial physical exam performed, notably the patient  was in NAD. Moving all 4 extremities. Abdomen soft and nontender. Sacral wound as above, does not appear acutely infected.    Reviewed and confirmed nursing documentation for past medical history, family history, social history.    Initial Assessment:   With the patient's presentation of abdominal pain, differential diagnosis includes but is not limited to AAA, mesenteric ischemia, appendicitis, diverticulitis, DKA, gastritis, gastroenteritis, AMI, nephrolithiasis, pancreatitis, peritonitis, adrenal insufficiency, intestinal ischemia, constipation, UTI, SBO/LBO, splenic rupture, biliary disease, IBD, IBS, PUD, hepatitis, STD, ovarian/testicular torsion, electrolyte disturbance, DKA, dehydration, acute kidney injury, renal failure, cholecystitis, cholelithiasis, choledocholithiasis, abdominal pain of  unknown etiology.   Initial Plan:  Screening labs including CBC and Metabolic panel to evaluate for infectious or metabolic etiology of disease.  Lipase to evaluate for pancreatitis Urinalysis with reflex culture ordered to evaluate for UTI or relevant urologic/nephrologic pathology.  CT abd/pelvis to evaluate for intra-abdominal pathology EKG to evaluate for cardiac pathology Occult blood card for evaluation of GI bleed Right ankle x-ray to evaluate swelling PT-INR to evaluate for bleeding risk Symptomatic management Objective evaluation as reviewed   Initial Study Results:   Laboratory  All laboratory results reviewed without evidence of clinically relevant pathology.   Exceptions include: Albumin 3.2, hemoglobin 9.8 with repeat 9.3, PT 23.8, INR 2.1 (patient anticoagulated), UA positive for leukocytes and  nitrites  EKG EKG was reviewed independently. ST segments without concerns for elevations.   EKG: normal sinus rhythm.   Radiology:  All images reviewed independently. Agree with radiology report at this time.   CT ABDOMEN PELVIS W CONTRAST  Result Date: 04/27/2023 CLINICAL DATA:  Acute nonlocalized abdominal pain. Possible GI bleed. Sacral wound. EXAM: CT ABDOMEN AND PELVIS WITH CONTRAST TECHNIQUE: Multidetector CT imaging of the abdomen and pelvis was performed using the standard protocol following bolus administration of intravenous contrast. RADIATION DOSE REDUCTION: This exam was performed according to the departmental dose-optimization program which includes automated exposure control, adjustment of the mA and/or kV according to patient size and/or use of iterative reconstruction technique. CONTRAST:  80mL OMNIPAQUE IOHEXOL 300 MG/ML  SOLN COMPARISON:  05/26/2021 FINDINGS: Lower chest: Large esophageal hiatal hernia. Small left pleural effusion with some basilar atelectasis. Hepatobiliary: No  focal liver abnormality is seen. Status post cholecystectomy. No biliary dilatation. Pancreas: Unremarkable. No pancreatic ductal dilatation or surrounding inflammatory changes. Spleen: Normal in size without focal abnormality. Adrenals/Urinary Tract: No adrenal gland nodules. Kidneys are symmetrical. Nephrograms are homogeneous. No hydronephrosis or hydroureter. Bladder is decompressed with otherwise normal appearance. Stomach/Bowel: Stomach, small bowel, and colon are not abnormally distended. No wall thickening or inflammatory changes are appreciated. Prominent diverticulosis of the sigmoid colon. No definite evidence of acute diverticulitis. Vascular/Lymphatic: Extensive calcification of the aorta and branch vessels. No aneurysm. No significant lymphadenopathy. Reproductive: Uterus appears to be surgically absent. Small cystic structure in the left adnexum measuring 2.5 cm diameter likely representing an  ovarian cyst. No change in appearance since prior study. Other: No free air or free fluid in the abdomen. Abdominal wall musculature appears intact. Musculoskeletal: Skin defect and subcutaneous soft tissue infiltration over the mid sacrum. This is consistent with sacral decubitus ulceration. Focal increased density within the defect may represent packing material. No definitive bone destruction to suggest osteomyelitis. No loculated collection. Prominent degenerative changes in the spine. Postoperative changes in both femurs. IMPRESSION: 1. No acute process demonstrated in the abdomen or pelvis. No evidence of bowel obstruction or inflammation. Diverticulosis of the sigmoid colon without obvious evidence of acute diverticulitis. 2. Left ovarian simple-appearing cyst measuring 2.5 cm. No follow-up imaging is recommended. Reference: JACR 2020 Feb;17(2):248-254 3. Soft tissue infiltration and skin defect over the sacrum consistent with sacral decubitus ulceration. No loculated collection or definite bone erosion. Electronically Signed   By: Burman Nieves M.D.   On: 04/27/2023 18:05   DG Ankle Right Port  Result Date: 04/27/2023 CLINICAL DATA:  Right ankle swelling EXAM: PORTABLE RIGHT ANKLE - 2 VIEW COMPARISON:  05/02/2013 FINDINGS: ORIF of distal tibial and fibular fractures has been performed with ankylosis of the distal tibia and fibula. 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 Osseous structures are diffusely osteopenic. No acute fracture or dislocation. Dense vascular calcifications are noted. Mild bimalleolar soft tissue swelling noted, medial greater than lateral. No ankle effusion. IMPRESSION: 1. Bimalleolar soft tissue swelling. No acute fracture or dislocation. 2. 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 Electronically Signed   By: Helyn Numbers M.D.   On: 04/27/2023 16:23   DG Chest Port 1 View  Result Date:  04/27/2023 CLINICAL DATA:  Edema right ankle EXAM: PORTABLE CHEST 1 VIEW COMPARISON:  Previous studies including the CT done on 06-13-2021 FINDINGS: Cardiac size is within normal limits. There are no signs of pulmonary edema. Linear densities are seen in left lower lung fields suggesting scarring or subsegmental atelectasis. There is blunting of both lateral CP angles. There is no pneumothorax. Pacemaker battery is seen in the left infraclavicular region. IMPRESSION: There are no signs of pulmonary edema. Linear densities in left lower lung field may suggest scarring or subsegmental atelectasis. Blunting of both lateral CP angles may suggest small bilateral pleural effusions or pleural thickening. Electronically Signed   By: Ernie Avena M.D.   On: 04/27/2023 16:23      Consults: Case discussed with Dr. Joneen Roach with hospital medicine who will admit.   Final Assessment and Plan:   89 year old female brought to the ED by daughter for evaluation of possible blood in her stool.  Notes that patient was on the toilet and she noticed bleeding.  No history of GI bleed.  Notes that patient has been slightly more confused than normal as well but difficult to assess as patient has history  of severe dementia.  Daughter notes that patient has a sacral wound that is new since staying with other relatives.  Daughter has assumed care of patient since yesterday.  Patient has a sacral ulcer.  No signs of infection.  Abdomen soft and nontender.  Daughter notes that patient also seems to be complaining of abdominal pain.  CT abdomen without acute finding.  Daughter noted right ankle swelling as well.  No bony abnormality on exam or x-ray.  Overall, blood work overwhelmingly reassuring.  No leukocytosis.  No significant electrolyte disturbance.  Normal kidney function.  Hemoglobin of 9.8 which is similar to patient's baseline when compared to recent hemoglobins through care everywhere.  UA does appear infected.  Patient has  extensive antibiotic allergies.  Discussed with pharmacy antibiotic treatment for patient's UTI and they recommended cefdinir or ceftriaxone as patient has had this class of medications before and tolerated it well.  Discussed findings with daughter who initially wanted to take patient home if possible but on reexamination states that as she just had shoulder replacement surgery she is unable to further care for patient at home.  She would like patient admitted for further evaluation.  She states that patient currently has a home health nurse and is scheduled to have more help including assistance with wound care starting next week.  Discussed with hospitalist who will admit patient.  Patient stable at time of admission.   Clinical Impression:  1. Cystitis   2. Skin ulcer of sacrum, limited to breakdown of skin (HCC)   3. Physical deconditioning      Admit           Final Clinical Impression(s) / ED Diagnoses Final diagnoses:  Cystitis  Skin ulcer of sacrum, limited to breakdown of skin [facility])  Physical deconditioning    Rx / DC Orders ED Discharge Orders     None         Tonette Lederer, PA-C 04/27/23 2132    Tonette Lederer, PA-C 04/28/23 1545    Rondel Baton, MD 04/30/23 [phone]

## 2023-04-27 NOTE — ED Notes (Signed)
Patient difficulty venous stick.  Unable to obtain blood cultures.
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
## 2023-04-27 NOTE — ED Triage Notes (Addendum)
Patient here POV from Family.  Endorses today noted Blood in Toilet when the Patient used the Restroom today. Noted to have some Bed Sores and Hemorrhoids while in Rehabilitation Facility in [place].   NAD Noted during Triage. BIB Wheelchair. History of Dementia.

## 2023-04-28 DIAGNOSIS — L98421 Non-pressure chronic ulcer of back limited to breakdown of skin: Secondary | ICD-10-CM

## 2023-04-28 DIAGNOSIS — Z515 Encounter for palliative care: Secondary | ICD-10-CM

## 2023-04-28 DIAGNOSIS — R5381 Other malaise: Secondary | ICD-10-CM

## 2023-04-28 DIAGNOSIS — F0280 Dementia in other diseases classified elsewhere without behavioral disturbance: Secondary | ICD-10-CM

## 2023-04-28 DIAGNOSIS — G301 Alzheimer's disease with late onset: Secondary | ICD-10-CM | POA: Diagnosis not present

## 2023-04-28 DIAGNOSIS — N390 Urinary tract infection, site not specified: Secondary | ICD-10-CM

## 2023-04-28 DIAGNOSIS — D649 Anemia, unspecified: Secondary | ICD-10-CM

## 2023-04-28 DIAGNOSIS — Z7189 Other specified counseling: Secondary | ICD-10-CM

## 2023-04-28 DIAGNOSIS — I482 Chronic atrial fibrillation, unspecified: Secondary | ICD-10-CM

## 2023-04-28 DIAGNOSIS — S31000A Unspecified open wound of lower back and pelvis without penetration into retroperitoneum, initial encounter: Secondary | ICD-10-CM | POA: Diagnosis not present

## 2023-04-28 DIAGNOSIS — M25571 Pain in right ankle and joints of right foot: Secondary | ICD-10-CM

## 2023-04-28 DIAGNOSIS — N3000 Acute cystitis without hematuria: Secondary | ICD-10-CM | POA: Diagnosis not present

## 2023-04-28 DIAGNOSIS — N1831 Chronic kidney disease, stage 3a: Secondary | ICD-10-CM | POA: Diagnosis not present

## 2023-04-28 DIAGNOSIS — Z66 Do not resuscitate: Secondary | ICD-10-CM

## 2023-04-28 LAB — BASIC METABOLIC PANEL
Anion gap: 6 (ref 5–15)
BUN: 14 mg/dL (ref 8–23)
CO2: 26 mmol/L (ref 22–32)
Calcium: 7.8 mg/dL — ABNORMAL LOW (ref 8.9–10.3)
Chloride: 106 mmol/L (ref 98–111)
Creatinine, Ser: 0.7 mg/dL (ref 0.44–1.00)
GFR, Estimated: >60 mL/min (ref >60)
Glucose, Bld: 75 mg/dL (ref 70–99)
Potassium: 3.7 mmol/L (ref 3.5–5.1)
Sodium: 138 mmol/L (ref 135–145)

## 2023-04-28 LAB — CBC
HCT: 29 % — ABNORMAL LOW (ref 36.0–46.0)
HCT: 29.6 % — ABNORMAL LOW (ref 36.0–46.0)
Hemoglobin: 8.8 g/dL — ABNORMAL LOW (ref 12.0–15.0)
Hemoglobin: 9 g/dL — ABNORMAL LOW (ref 12.0–15.0)
MCH: 29.1 pg (ref 26.0–34.0)
MCH: 28.8 pg (ref 26.0–34.0)
MCHC: 30.3 g/dL (ref 30.0–36.0)
MCHC: 30.4 g/dL (ref 30.0–36.0)
MCV: 96 fL (ref 80.0–100.0)
MCV: 94.9 fL (ref 80.0–100.0)
Platelets: 275 10*3/uL (ref 150–400)
Platelets: 262 10*3/uL (ref 150–400)
RBC: 3.02 MIL/uL — ABNORMAL LOW (ref 3.87–5.11)
RBC: 3.12 MIL/uL — ABNORMAL LOW (ref 3.87–5.11)
RDW: 14.6 % (ref 11.5–15.5)
RDW: 14.8 % (ref 11.5–15.5)
WBC: 7 10*3/uL (ref 4.0–10.5)
WBC: 5.6 10*3/uL (ref 4.0–10.5)
nRBC: 0 % (ref 0.0–0.2)
nRBC: 0 % (ref 0.0–0.2)

## 2023-04-28 LAB — CULTURE, BLOOD (ROUTINE X 2): Culture: NO GROWTH

## 2023-04-28 LAB — URIC ACID: Uric Acid, Serum: 4.4 mg/dL (ref 2.5–7.1)

## 2023-04-28 MED ORDER — AMLODIPINE BESYLATE 5 MG PO TABS: 5.0000 mg | ORAL_TABLET | Freq: Every day | ORAL | Status: DC

## 2023-04-28 MED ORDER — FAMOTIDINE 20 MG PO TABS
20.0000 mg | ORAL_TABLET | Freq: Every day | ORAL | Status: DC
  Administered 2023-04-28 – 2023-05-01 (×4): 20 mg via ORAL
  Filled 2023-04-28 (×4): qty 1

## 2023-04-28 MED ORDER — ACETAMINOPHEN 325 MG PO TABS
650.0000 mg | ORAL_TABLET | Freq: Four times a day (QID) | ORAL | Status: DC | PRN
  Administered 2023-04-29 (×2): 650 mg via ORAL
  Filled 2023-04-28 (×2): qty 2

## 2023-04-28 MED ORDER — AMIODARONE HCL 200 MG PO TABS
100.0000 mg | ORAL_TABLET | Freq: Every day | ORAL | Status: DC
  Administered 2023-04-28 – 2023-05-01 (×4): 100 mg via ORAL
  Filled 2023-04-28 (×4): qty 1

## 2023-04-28 MED ORDER — GABAPENTIN 300 MG PO CAPS
300.0000 mg | ORAL_CAPSULE | Freq: Two times a day (BID) | ORAL | Status: DC
  Administered 2023-04-28 – 2023-05-01 (×8): 300 mg via ORAL
  Filled 2023-04-28 (×8): qty 1

## 2023-04-28 MED ORDER — DONEPEZIL HCL 10 MG PO TABS
10.0000 mg | ORAL_TABLET | Freq: Every day | ORAL | Status: DC
  Administered 2023-04-28 – 2023-04-30 (×3): 10 mg via ORAL
  Filled 2023-04-28 (×3): qty 1

## 2023-04-28 MED ORDER — RIVAROXABAN 20 MG PO TABS
20.0000 mg | ORAL_TABLET | Freq: Every day | ORAL | Status: DC
  Administered 2023-04-28 – 2023-04-30 (×3): 20 mg via ORAL
  Filled 2023-04-28 (×3): qty 1

## 2023-04-28 MED ORDER — LORATADINE 10 MG PO TABS: 10.0000 mg | ORAL_TABLET | Freq: Every day | ORAL | Status: DC | PRN

## 2023-04-28 MED ORDER — METOPROLOL SUCCINATE ER 25 MG PO TB24: 25.0000 mg | ORAL_TABLET | Freq: Every day | ORAL | Status: DC

## 2023-04-28 MED ORDER — SODIUM CHLORIDE 0.9 % IV SOLN
1.0000 g | INTRAVENOUS | Status: DC
  Administered 2023-04-28 – 2023-04-30 (×3): 1 g via INTRAVENOUS
  Filled 2023-04-28 (×3): qty 10

## 2023-04-28 MED ORDER — PRAVASTATIN SODIUM 20 MG PO TABS
80.0000 mg | ORAL_TABLET | Freq: Every day | ORAL | Status: DC
  Administered 2023-04-28 – 2023-05-01 (×4): 80 mg via ORAL
  Filled 2023-04-28 (×4): qty 4

## 2023-04-28 MED ORDER — ACETAMINOPHEN 650 MG RE SUPP: 650.0000 mg | Freq: Four times a day (QID) | RECTAL | Status: DC | PRN

## 2023-04-28 NOTE — Assessment & Plan Note (Signed)
Stable, continue to monitor  ?

## 2023-04-28 NOTE — Assessment & Plan Note (Addendum)
89 year old female with history of alzheimers who has been living with different children was recently picked up by one of her daughters yesterday to take her home and while cleaning her noticed she had a large wound on her buttocks that was bleeding and was concerned for possible blood in stool and wanted sacral wound looked at -obs to med surg -CT scan shows no definite bone erosion or loculated collection. It is quite extensive, but not probable. No surrounding erythema.  -Will need wound care consult -frequent turns in bed -daughter has already ordered her an air mattress for home -palliative care consult  -fecal occult negative, trend H&H

## 2023-04-28 NOTE — Assessment & Plan Note (Signed)
Followed by Dr. Jacinto Halim Continue xarelto, amiodorone and metoprolol

## 2023-04-28 NOTE — Assessment & Plan Note (Signed)
History of recurrent fractures in right ankle Followed by Dr. Magnus Ivan.  No signs of acute fracture on xray Bimalleolar soft tissue swelling Will check uric acid

## 2023-04-28 NOTE — Assessment & Plan Note (Signed)
Followed by Dr. Jacinto Halim Continue medical management with high intensity statin, metoprolol

## 2023-04-28 NOTE — Progress Notes (Signed)
PROGRESS NOTE    Jill Bowman  ZOX:[id number] DOB: November 08, 1929 DOA: 04/27/2023 PCP: Arnette Felts, FNP   Brief Narrative:   89 y.o. female with medical history significant of CAD s/p stent in [place] in 2005, SSS s/p PPM, HTN, dementia, CKD stage 3,  hx of breast cancer, atrial fibrillation, GERD, depression was brought in by daughter with concerns for sacral wound bleeding and ankle pain.  On presentation, vitals were stable; fecal occult blood was negative; hemoglobin was 9.8.  UA was suggestive of UTI.  Chest x-ray showed no acute findings.  Right ankle x-ray showed bimalleolar soft tissue swelling but no fracture.  CT of abdomen and pelvis showed no acute findings; soft tissue infiltration and skin defect over the sacrum consistent with sacral decubitus ulceration with no loculated collection or definite bone erosion.  Assessment & Plan:   Sacral wound: Present on admission -Daughter was concerned about bleeding from her sacral wound. -Imaging as above. -Wound care consult. -Frequent turns in bed. -Palliative care consult for goals of care discussion -Fecal occult blood was negative and hemoglobin has remained stable.  Doubt that patient had any rectal bleeding.  UTI: Present on admission -Continue Rocephin.  Follow cultures.  Normocytic anemia/anemia of chronic disease -Hemoglobin stable.  Monitor intermittently.  Transfuse if hemoglobin less than 7  Right ankle pain -History of recurrent fractures and right ankle.  Followed by Dr. Magnus Ivan -No signs of acute fracture on x-ray. -Uric acid not elevated. -PT eval  Dementia without behavioral disturbances  Goals of care -Delirium/fall precautions. -Continue Aricept -Currently full code -Palliative care consulted for goals of care discussion  CAD -Stable.  Outpatient follow-up with Dr. Nadara Eaton -Continue medical management with high intensity statin, metoprolol  Chronic atrial fibrillation -Mildly bradycardic.  Continue  amiodarone and Xarelto.  Hold beta-blocker because of blood pressure being on the lower side  Hypertension -Blood pressure mostly on the lower side.  Hold amlodipine and beta-blocker  Diabetes mellitus type 2 -A1c 5.2 in 10/2022.  Continue carb modified diet.  Blood sugars stable  Hyperlipidemia -Continue high intensity statin  CKD stage IIIa -Creatinine stable.   DVT prophylaxis: Xarelto Code Status: Full Family Communication: None at bedside Disposition Plan: Status is: Observation The patient will require care spanning > 2 midnights and should be moved to inpatient because: Of severity of illness.  Need for IV antibiotics.  Consultants: Palliative care  Procedures: None  Antimicrobials: Rocephin from 04/27/2023 onwards   Subjective: Patient seen and examined at bedside.  Awake but pleasantly confused.  No fever, vomiting or seizures reported.  Objective: Vitals:   04/28/23 0209 04/28/23 0616 04/28/23 0815 04/28/23 1019  BP: (!) 123/43 (!) 96/39  (!) 94/49  Pulse: (!) 58 (!) 57  (!) 59  Resp: 16 17  18   Temp: 98 F (36.7 C) 98.2 F (36.8 C)  98.6 F (37 C)  TempSrc: Oral Oral  Oral  SpO2: 96% 97%  98%  Weight:   80.9 kg   Height:   5\' 11"  (1.803 m)     Intake/Output Summary (Last 24 hours) at 04/28/2023 1113 Last data filed at 04/28/2023 0900 Gross per 24 hour  Intake 100 ml  Output --  Net 100 ml   Filed Weights   04/28/23 0815  Weight: 80.9 kg    Examination:  General exam: Appears calm and comfortable.  Looks chronically ill and deconditioned.  On room air.  Elderly female lying in bed. Respiratory system: Bilateral decreased breath sounds at bases with some  scattered crackles Cardiovascular system: S1 & S2 heard, bradycardic Gastrointestinal system: Abdomen is nondistended, soft and nontender. Normal bowel sounds heard. Extremities: No cyanosis, clubbing; trace lower extremity edema Central nervous system: Awake, confused to time.  Slow to respond.   Poor historian.  No focal neurological deficits. Moving extremities Skin: No petechiae/lesions  psychiatry: Not agitated.  Flat affect   Data Reviewed: I have personally reviewed following labs and imaging studies  CBC: Recent Labs  Lab 04/27/23 1602 04/27/23 1856 04/28/23 0142 04/28/23 0711  WBC 8.3  --  7.0 5.6  NEUTROABS 5.5  --   --   --   HGB 9.8* 9.3* 8.8* 9.0*  HCT 31.9* 30.1* 29.0* 29.6*  MCV 93.8  --  96.0 94.9  PLT 278  --  275 262   Basic Metabolic Panel: Recent Labs  Lab 04/27/23 1602 04/28/23 0711  NA 142 138  K 4.2 3.7  CL 107 106  CO2 27 26  GLUCOSE 110* 75  BUN 17 14  CREATININE 0.88 0.70  CALCIUM 8.3* 7.8*   GFR: Estimated Creatinine Clearance: 51.2 mL/min (by C-G formula based on SCr of 0.7 mg/dL). Liver Function Tests: Recent Labs  Lab 04/27/23 1602  AST 17  ALT 5  ALKPHOS 89  BILITOT 0.3  PROT 6.7  ALBUMIN 3.2*   Recent Labs  Lab 04/27/23 1602  LIPASE 20   No results for input(s): "AMMONIA" in the last 168 hours. Coagulation Profile: Recent Labs  Lab 04/27/23 1602  INR 2.1*   Cardiac Enzymes: No results for input(s): "CKTOTAL", "CKMB", "CKMBINDEX", "TROPONINI" in the last 168 hours. BNP (last 3 results) No results for input(s): "PROBNP" in the last 8760 hours. HbA1C: No results for input(s): "HGBA1C" in the last 72 hours. CBG: No results for input(s): "GLUCAP" in the last 168 hours. Lipid Profile: No results for input(s): "CHOL", "HDL", "LDLCALC", "TRIG", "CHOLHDL", "LDLDIRECT" in the last 72 hours. Thyroid Function Tests: No results for input(s): "TSH", "T4TOTAL", "FREET4", "T3FREE", "THYROIDAB" in the last 72 hours. Anemia Panel: No results for input(s): "VITAMINB12", "FOLATE", "FERRITIN", "TIBC", "IRON", "RETICCTPCT" in the last 72 hours. Sepsis Labs: No results for input(s): "PROCALCITON", "LATICACIDVEN" in the last 168 hours.  Recent Results (from the past 240 hour(s))  Blood culture (routine x 2)     Status: None  (Preliminary result)   Collection Time: 04/27/23 11:12 PM   Specimen: BLOOD  Result Value Ref Range Status   Specimen Description   Final    BLOOD BLOOD RIGHT ARM Performed at [facility], [address] [place], [place] [address]    Special Requests   Final    BOTTLES DRAWN AEROBIC AND ANAEROBIC Blood Culture adequate volume Performed at [facility] [address], [place], [place] [address]    Culture   Final    NO GROWTH < 12 HOURS Performed at [facility], [address] [place], [place] [address]    Report Status PENDING  Incomplete  Blood culture (routine x 2)     Status: None (Preliminary result)   Collection Time: 04/27/23 11:18 PM   Specimen: BLOOD  Result Value Ref Range Status   Specimen Description   Final    BLOOD BLOOD RIGHT ARM Performed at [facility], [address] [place], [place] [address]    Special Requests   Final    BOTTLES DRAWN AEROBIC AND ANAEROBIC Blood Culture adequate volume Performed at [facility], [address]  [place], [place] [address]    Culture   Final    NO GROWTH < 12 HOURS Performed at [facility], [address], [place], [place] [address]    Report Status PENDING  Incomplete         Radiology Studies: CT ABDOMEN PELVIS W CONTRAST  Result Date: 04/27/2023 CLINICAL DATA:  Acute nonlocalized abdominal pain. Possible GI bleed. Sacral wound. EXAM: CT ABDOMEN AND PELVIS WITH CONTRAST TECHNIQUE: Multidetector CT imaging of the abdomen and pelvis was performed using the standard protocol following bolus administration of intravenous contrast. RADIATION DOSE REDUCTION: This exam was performed according to the departmental dose-optimization program which includes automated exposure control, adjustment of the mA and/or kV according to patient size and/or use of iterative reconstruction technique. CONTRAST:  80mL OMNIPAQUE IOHEXOL 300 MG/ML  SOLN  COMPARISON:  Jun 01, 2021 FINDINGS: Lower chest: Large esophageal hiatal hernia. Small left pleural effusion with some basilar atelectasis. Hepatobiliary: No focal liver abnormality is seen. Status post cholecystectomy. No biliary dilatation. Pancreas: Unremarkable. No pancreatic ductal dilatation or surrounding inflammatory changes. Spleen: Normal in size without focal abnormality. Adrenals/Urinary Tract: No adrenal gland nodules. Kidneys are symmetrical. Nephrograms are homogeneous. No hydronephrosis or hydroureter. Bladder is decompressed with otherwise normal appearance. Stomach/Bowel: Stomach, small bowel, and colon are not abnormally distended. No wall thickening or inflammatory changes are appreciated. Prominent diverticulosis of the sigmoid colon. No definite evidence of acute diverticulitis. Vascular/Lymphatic: Extensive calcification of the aorta and branch vessels. No aneurysm. No significant lymphadenopathy. Reproductive: Uterus appears to be surgically absent. Small cystic structure in the left adnexum measuring 2.5 cm diameter likely representing an ovarian cyst. No change in appearance since prior study. Other: No free air or free fluid in the abdomen. Abdominal wall musculature appears intact. Musculoskeletal: Skin defect and subcutaneous soft tissue infiltration over the mid sacrum. This is consistent with sacral decubitus ulceration. Focal increased density within the defect may represent packing material. No definitive bone destruction to suggest osteomyelitis. No loculated collection. Prominent degenerative changes in the spine. Postoperative changes in both femurs. IMPRESSION: 1. No acute process demonstrated in the abdomen or pelvis. No evidence of bowel obstruction or inflammation. Diverticulosis of the sigmoid colon without obvious evidence of acute diverticulitis. 2. Left ovarian simple-appearing cyst measuring 2.5 cm. No follow-up imaging is recommended. Reference: JACR 2020 Feb;17(2):248-254  3. Soft tissue infiltration and skin defect over the sacrum consistent with sacral decubitus ulceration. No loculated collection or definite bone erosion. Electronically Signed   By: Burman Nieves M.D.   On: 04/27/2023 18:05   DG Ankle Right Port  Result Date: 04/27/2023 CLINICAL DATA:  Right ankle swelling EXAM: PORTABLE RIGHT ANKLE - 2 VIEW COMPARISON:  05/02/2013 FINDINGS: ORIF of distal tibial and fibular fractures has been performed with ankylosis of the distal tibia and fibula. 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 Osseous structures are diffusely osteopenic. No acute fracture or dislocation. Dense vascular calcifications are noted. Mild bimalleolar soft tissue swelling noted, medial greater than lateral. No ankle effusion. IMPRESSION: 1. Bimalleolar soft tissue swelling. No acute fracture or dislocation. 2. 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 Electronically Signed   By: Helyn Numbers M.D.   On: 04/27/2023 16:23   DG Chest Port 1 View  Result Date: 04/27/2023 CLINICAL DATA:  Edema right ankle EXAM: PORTABLE CHEST 1 VIEW COMPARISON:  Previous studies including the CT done on 06-01-21 FINDINGS: Cardiac size is within normal limits. There are no signs of pulmonary edema. Linear densities are seen  in left lower lung fields suggesting scarring or subsegmental atelectasis. There is blunting of both lateral CP angles. There is no pneumothorax. Pacemaker battery is seen in the left infraclavicular region. IMPRESSION: There are no signs of pulmonary edema. Linear densities in left lower lung field may suggest scarring or subsegmental atelectasis. Blunting of both lateral CP angles may suggest small bilateral pleural effusions or pleural thickening. Electronically Signed   By: Ernie Avena M.D.   On: 04/27/2023 16:23        Scheduled Meds:  amiodarone  100 mg Oral Daily   donepezil  10 mg Oral QHS   famotidine  20  mg Oral Daily   gabapentin  300 mg Oral BID   pravastatin  80 mg Oral Daily   rivaroxaban  20 mg Oral Q supper   Continuous Infusions:  cefTRIAXone (ROCEPHIN)  IV            Glade Lloyd, MD [facility] 04/28/2023, 11:13 AM

## 2023-04-28 NOTE — Progress Notes (Signed)
Pain c/o eye dryness, stated that she take eye drops at home.

## 2023-04-28 NOTE — Assessment & Plan Note (Signed)
UA suspicious for infection Initially reported as confusion in patient, but daughter states she has not been confused Culture added to urine, continue rocephin for now
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
## 2023-04-28 NOTE — Assessment & Plan Note (Signed)
Daughter denies any increased confusion  Continue aricept  Fall precautions Delirium precautions

## 2023-04-28 NOTE — Assessment & Plan Note (Signed)
Continue high intensity statin 

## 2023-04-28 NOTE — Assessment & Plan Note (Signed)
Continue pepcid daily  

## 2023-04-28 NOTE — Assessment & Plan Note (Addendum)
Recent hgb checked in April and was 10.2 On presentation hgb 9.8 Iron studies 3/24: normal B12, % saturation low at 11, TIBC/ferritin and folate wnl.  Fecal occult negative Will check hgb again tonight to trend, but likely blood from sacral wound No active bleeding on exam and CT abdomen with no acute findings  Continue xarelto for now

## 2023-04-28 NOTE — Assessment & Plan Note (Addendum)
A1C of 5.2 in 12/23 Continue diet modifications

## 2023-04-29 DIAGNOSIS — D631 Anemia in chronic kidney disease: Secondary | ICD-10-CM | POA: Diagnosis present

## 2023-04-29 DIAGNOSIS — Z87891 Personal history of nicotine dependence: Secondary | ICD-10-CM | POA: Diagnosis not present

## 2023-04-29 DIAGNOSIS — Z66 Do not resuscitate: Secondary | ICD-10-CM | POA: Diagnosis not present

## 2023-04-29 DIAGNOSIS — I495 Sick sinus syndrome: Secondary | ICD-10-CM | POA: Diagnosis present

## 2023-04-29 DIAGNOSIS — Z801 Family history of malignant neoplasm of trachea, bronchus and lung: Secondary | ICD-10-CM | POA: Diagnosis not present

## 2023-04-29 DIAGNOSIS — E1122 Type 2 diabetes mellitus with diabetic chronic kidney disease: Secondary | ICD-10-CM | POA: Diagnosis present

## 2023-04-29 DIAGNOSIS — I2510 Atherosclerotic heart disease of native coronary artery without angina pectoris: Secondary | ICD-10-CM | POA: Diagnosis present

## 2023-04-29 DIAGNOSIS — G9341 Metabolic encephalopathy: Secondary | ICD-10-CM

## 2023-04-29 DIAGNOSIS — M19011 Primary osteoarthritis, right shoulder: Secondary | ICD-10-CM | POA: Diagnosis present

## 2023-04-29 DIAGNOSIS — Z515 Encounter for palliative care: Secondary | ICD-10-CM

## 2023-04-29 DIAGNOSIS — F02811 Dementia in other diseases classified elsewhere, unspecified severity, with agitation: Secondary | ICD-10-CM | POA: Diagnosis present

## 2023-04-29 DIAGNOSIS — N390 Urinary tract infection, site not specified: Secondary | ICD-10-CM | POA: Diagnosis present

## 2023-04-29 DIAGNOSIS — S31000A Unspecified open wound of lower back and pelvis without penetration into retroperitoneum, initial encounter: Secondary | ICD-10-CM | POA: Diagnosis not present

## 2023-04-29 DIAGNOSIS — L89150 Pressure ulcer of sacral region, unstageable: Secondary | ICD-10-CM | POA: Diagnosis present

## 2023-04-29 DIAGNOSIS — L98421 Non-pressure chronic ulcer of back limited to breakdown of skin: Secondary | ICD-10-CM | POA: Diagnosis not present

## 2023-04-29 DIAGNOSIS — N3000 Acute cystitis without hematuria: Secondary | ICD-10-CM | POA: Diagnosis not present

## 2023-04-29 DIAGNOSIS — I129 Hypertensive chronic kidney disease with stage 1 through stage 4 chronic kidney disease, or unspecified chronic kidney disease: Secondary | ICD-10-CM | POA: Diagnosis present

## 2023-04-29 DIAGNOSIS — M19012 Primary osteoarthritis, left shoulder: Secondary | ICD-10-CM | POA: Diagnosis present

## 2023-04-29 DIAGNOSIS — G301 Alzheimer's disease with late onset: Secondary | ICD-10-CM | POA: Diagnosis present

## 2023-04-29 DIAGNOSIS — R5381 Other malaise: Secondary | ICD-10-CM | POA: Diagnosis present

## 2023-04-29 DIAGNOSIS — M19071 Primary osteoarthritis, right ankle and foot: Secondary | ICD-10-CM | POA: Diagnosis present

## 2023-04-29 DIAGNOSIS — Z955 Presence of coronary angioplasty implant and graft: Secondary | ICD-10-CM | POA: Diagnosis not present

## 2023-04-29 DIAGNOSIS — Z881 Allergy status to other antibiotic agents status: Secondary | ICD-10-CM | POA: Diagnosis not present

## 2023-04-29 DIAGNOSIS — N1831 Chronic kidney disease, stage 3a: Secondary | ICD-10-CM | POA: Diagnosis present

## 2023-04-29 DIAGNOSIS — Z853 Personal history of malignant neoplasm of breast: Secondary | ICD-10-CM | POA: Diagnosis not present

## 2023-04-29 DIAGNOSIS — E785 Hyperlipidemia, unspecified: Secondary | ICD-10-CM | POA: Diagnosis present

## 2023-04-29 DIAGNOSIS — I482 Chronic atrial fibrillation, unspecified: Secondary | ICD-10-CM | POA: Diagnosis present

## 2023-04-29 DIAGNOSIS — K219 Gastro-esophageal reflux disease without esophagitis: Secondary | ICD-10-CM | POA: Diagnosis present

## 2023-04-29 DIAGNOSIS — M19072 Primary osteoarthritis, left ankle and foot: Secondary | ICD-10-CM | POA: Diagnosis present

## 2023-04-29 LAB — CBC WITH DIFFERENTIAL/PLATELET
Abs Immature Granulocytes: 0.03 10*3/uL (ref 0.00–0.07)
Basophils Absolute: 0 10*3/uL (ref 0.0–0.1)
Basophils Relative: 0 %
Eosinophils Absolute: 0.3 10*3/uL (ref 0.0–0.5)
Eosinophils Relative: 6 %
HCT: 30.1 % — ABNORMAL LOW (ref 36.0–46.0)
Hemoglobin: 9.4 g/dL — ABNORMAL LOW (ref 12.0–15.0)
Immature Granulocytes: 1 %
Lymphocytes Relative: 30 %
Lymphs Abs: 1.7 10*3/uL (ref 0.7–4.0)
MCH: 29.5 pg (ref 26.0–34.0)
MCHC: 31.2 g/dL (ref 30.0–36.0)
MCV: 94.4 fL (ref 80.0–100.0)
Monocytes Absolute: 0.5 10*3/uL (ref 0.1–1.0)
Monocytes Relative: 9 %
Neutro Abs: 3.1 10*3/uL (ref 1.7–7.7)
Neutrophils Relative %: 54 %
Platelets: 261 10*3/uL (ref 150–400)
RBC: 3.19 MIL/uL — ABNORMAL LOW (ref 3.87–5.11)
RDW: 14.7 % (ref 11.5–15.5)
WBC: 5.6 10*3/uL (ref 4.0–10.5)
nRBC: 0 % (ref 0.0–0.2)

## 2023-04-29 LAB — BASIC METABOLIC PANEL
Anion gap: 4 — ABNORMAL LOW (ref 5–15)
BUN: 14 mg/dL (ref 8–23)
CO2: 27 mmol/L (ref 22–32)
Calcium: 7.9 mg/dL — ABNORMAL LOW (ref 8.9–10.3)
Chloride: 109 mmol/L (ref 98–111)
Creatinine, Ser: 0.89 mg/dL (ref 0.44–1.00)
GFR, Estimated: >60 mL/min (ref >60)
Glucose, Bld: 88 mg/dL (ref 70–99)
Potassium: 3.7 mmol/L (ref 3.5–5.1)
Sodium: 140 mmol/L (ref 135–145)

## 2023-04-29 LAB — CULTURE, BLOOD (ROUTINE X 2): Special Requests: ADEQUATE

## 2023-04-29 LAB — MAGNESIUM: Magnesium: 1.9 mg/dL (ref 1.7–2.4)

## 2023-04-29 MED ORDER — MEDIHONEY WOUND/BURN DRESSING EX PSTE
1.0000 | PASTE | Freq: Every day | CUTANEOUS | Status: DC
  Administered 2023-04-29 – 2023-05-01 (×3): 1 via TOPICAL
  Filled 2023-04-29: qty 44

## 2023-04-29 MED ORDER — HALOPERIDOL LACTATE 5 MG/ML IJ SOLN
1.0000 mg | Freq: Once | INTRAMUSCULAR | Status: AC
  Administered 2023-04-29: 1 mg via INTRAVENOUS
  Filled 2023-04-29: qty 1

## 2023-04-29 MED ORDER — HALOPERIDOL LACTATE 5 MG/ML IJ SOLN: 1.0000 mg | Freq: Four times a day (QID) | INTRAMUSCULAR | Status: DC | PRN

## 2023-04-29 MED ORDER — MELATONIN 3 MG PO TABS
3.0000 mg | ORAL_TABLET | Freq: Every evening | ORAL | Status: DC | PRN
  Administered 2023-04-29: 3 mg via ORAL
  Filled 2023-04-29: qty 1

## 2023-04-29 MED ORDER — DICLOFENAC SODIUM 1 % EX GEL
4.0000 g | Freq: Four times a day (QID) | CUTANEOUS | Status: DC
  Administered 2023-04-29 – 2023-05-01 (×8): 4 g via TOPICAL
  Filled 2023-04-29: qty 100

## 2023-04-29 MED ORDER — MELATONIN 3 MG PO TABS
3.0000 mg | ORAL_TABLET | Freq: Once | ORAL | Status: AC
  Administered 2023-04-29: 3 mg via ORAL
  Filled 2023-04-29: qty 1

## 2023-04-29 NOTE — Progress Notes (Signed)
    Patient Name: Jill Bowman           DOB: [birth year]  MRN: [id number]      Admission Date: 04/27/2023  Attending Provider: Glade Lloyd, MD  Primary Diagnosis: Sacral wound   Level of care: Med-Surg    CROSS COVER NOTE   Date of Service   04/29/2023   Jill Bowman, 89 y.o. female, was admitted on 04/27/2023 for Sacral wound.    HPI/Events of Note   Notified by nursing staff that patient is agitated, restless, and confused.  Patient also becoming physically and verbally aggressive with staff (cursing, hitting, kicking). PMHx of dementia without behavioral disturbances.    RN reports that patient has attempted to get out of bed multiple times.  High fall risk.  Unfortunately, she is not following commands despite multiple attempts at redirection by staff.  Safety sitter not available at this time, however will trial TeleSitter.  Although records show use of Seroquel in the past, daughter reported hallucinations with this medication.   Will trial low-dose IV Haldol for agitation. Melatonin has also been ordered to assist with sleep cycle.   Interventions/ Plan   1 mg IV Haldol Melatonin TeleSitter        Anthoney Harada, DNP, ACNPC- AG [facility] [place]

## 2023-04-29 NOTE — Plan of Care (Signed)

## 2023-04-29 NOTE — Progress Notes (Signed)
PROGRESS NOTE    Jill Bowman  WPY:[id number] DOB: July 28, 1929 DOA: 04/27/2023 PCP: Arnette Felts, FNP   Brief Narrative:   89 y.o. female with medical history significant of CAD s/p stent in [place] in 2005, SSS s/p PPM, HTN, dementia, CKD stage 3,  hx of breast cancer, atrial fibrillation, GERD, depression was brought in by daughter with concerns for sacral wound bleeding and ankle pain.  On presentation, vitals were stable; fecal occult blood was negative; hemoglobin was 9.8.  UA was suggestive of UTI.  Chest x-ray showed no acute findings.  Right ankle x-ray showed bimalleolar soft tissue swelling but no fracture.  CT of abdomen and pelvis showed no acute findings; soft tissue infiltration and skin defect over the sacrum consistent with sacral decubitus ulceration with no loculated collection or definite bone erosion.  Assessment & Plan:   Sacral wound: Present on admission -Daughter was concerned about bleeding from her sacral wound. -Imaging as above. -Wound care consult is still pending. -Frequent turns in bed. -Palliative care consulted.  CODE STATUS has been changed to DNR by palliative care team -Fecal occult blood was negative and hemoglobin has remained stable.  Doubt that patient had any rectal bleeding.  UTI: Present on admission -Continue Rocephin.  Follow cultures.  Normocytic anemia/anemia of chronic disease -Hemoglobin stable.  Monitor intermittently.  Transfuse if hemoglobin less than 7  Right ankle pain -History of recurrent fractures and right ankle.  Followed by Dr. Magnus Ivan -No signs of acute fracture on x-ray. -Uric acid not elevated. -PT eval  Dementia without behavioral disturbances  Agitation/delirium Goals of care -Delirium/fall precautions.  Patient was agitated overnight and received IV Haldol. -Continue Aricept -Currently full code -Palliative care consulted for goals of care discussion  CAD -Stable.  Outpatient follow-up with Dr. Jacinto Halim -Continue  medical management with high intensity statin, metoprolol  Chronic atrial fibrillation -Mildly bradycardic.  Continue amiodarone and Xarelto.  Hold beta-blocker because of blood pressure being on the lower side  Hypertension -Blood pressure mostly on the lower side.  Hold amlodipine and beta-blocker  Diabetes mellitus type 2 -A1c 5.2 in 10/2022.  Continue carb modified diet.  Blood sugars stable  Hyperlipidemia -Continue high intensity statin  CKD stage IIIa -Creatinine stable.   DVT prophylaxis: Xarelto Code Status: Full Family Communication: None at bedside Disposition Plan: Status is:  inpatient because: Of severity of illness.  Need for IV antibiotics.  Consultants: Palliative care  Procedures: None  Antimicrobials: Rocephin from 04/27/2023 onwards   Subjective: Patient seen and examined at bedside.  Nursing staff reports agitation overnight.  No seizures, fever or vomiting reported.  Objective: Vitals:   04/28/23 1019 04/28/23 1324 04/28/23 2018 04/29/23 0553  BP: (!) 94/49 (!) 103/48 113/65 (!) 116/45  Pulse: (!) 59 (!) 57 77 (!) 57  Resp: 18 16 18 16   Temp: 98.6 F (37 C) 99.1 F (37.3 C) 98.2 F (36.8 C) (!) 97.5 F (36.4 C)  TempSrc: Oral Oral Oral Oral  SpO2: 98% 97% 96% 99%  Weight:      Height:        Intake/Output Summary (Last 24 hours) at 04/29/2023 0815 Last data filed at 04/29/2023 0726 Gross per 24 hour  Intake 560 ml  Output 1000 ml  Net -440 ml    Filed Weights   04/28/23 0815  Weight: 80.9 kg    Examination:  General: Currently on room air.  No distress.  Looks chronically ill and deconditioned.  Elderly female lying in bed. ENT/neck: No thyromegaly.  JVD is not elevated  respiratory: Decreased breath sounds at bases bilaterally with some crackles; no wheezing  CVS: S1-S2 heard, intermittently bradycardic  abdominal: Soft, nontender, slightly distended; no organomegaly, bowel sounds are heard Extremities: Trace lower extremity  edema; no cyanosis  CNS: Very drowsy.  Poor historian.  No focal neurologic deficit.  Moves extremities Lymph: No obvious lymphadenopathy Skin: No obvious ecchymosis/lesions  psych: Not agitated currently.  Affect is extremely flat.   Musculoskeletal: No obvious joint swelling/deformity    Data Reviewed: I have personally reviewed following labs and imaging studies  CBC: Recent Labs  Lab 04/27/23 1602 04/27/23 1856 04/28/23 0142 04/28/23 0711 04/29/23 0546  WBC 8.3  --  7.0 5.6 5.6  NEUTROABS 5.5  --   --   --  3.1  HGB 9.8* 9.3* 8.8* 9.0* 9.4*  HCT 31.9* 30.1* 29.0* 29.6* 30.1*  MCV 93.8  --  96.0 94.9 94.4  PLT 278  --  275 262 261    Basic Metabolic Panel: Recent Labs  Lab 04/27/23 1602 04/28/23 0711 04/29/23 0546  NA 142 138 140  K 4.2 3.7 3.7  CL 107 106 109  CO2 27 26 27   GLUCOSE 110* 75 88  BUN 17 14 14   CREATININE 0.88 0.70 0.89  CALCIUM 8.3* 7.8* 7.9*  MG  --   --  1.9    GFR: Estimated Creatinine Clearance: 46 mL/min (by C-G formula based on SCr of 0.89 mg/dL). Liver Function Tests: Recent Labs  Lab 04/27/23 1602  AST 17  ALT 5  ALKPHOS 89  BILITOT 0.3  PROT 6.7  ALBUMIN 3.2*    Recent Labs  Lab 04/27/23 1602  LIPASE 20    No results for input(s): "AMMONIA" in the last 168 hours. Coagulation Profile: Recent Labs  Lab 04/27/23 1602  INR 2.1*    Cardiac Enzymes: No results for input(s): "CKTOTAL", "CKMB", "CKMBINDEX", "TROPONINI" in the last 168 hours. BNP (last 3 results) No results for input(s): "PROBNP" in the last 8760 hours. HbA1C: No results for input(s): "HGBA1C" in the last 72 hours. CBG: No results for input(s): "GLUCAP" in the last 168 hours. Lipid Profile: No results for input(s): "CHOL", "HDL", "LDLCALC", "TRIG", "CHOLHDL", "LDLDIRECT" in the last 72 hours. Thyroid Function Tests: No results for input(s): "TSH", "T4TOTAL", "FREET4", "T3FREE", "THYROIDAB" in the last 72 hours. Anemia Panel: No results for  input(s): "VITAMINB12", "FOLATE", "FERRITIN", "TIBC", "IRON", "RETICCTPCT" in the last 72 hours. Sepsis Labs: No results for input(s): "PROCALCITON", "LATICACIDVEN" in the last 168 hours.  Recent Results (from the past 240 hour(s))  Blood culture (routine x 2)     Status: None (Preliminary result)   Collection Time: 04/27/23 11:12 PM   Specimen: BLOOD  Result Value Ref Range Status   Specimen Description   Final    BLOOD BLOOD RIGHT ARM Performed at [facility], [address], [place], [place] [address]    Special Requests   Final    BOTTLES DRAWN AEROBIC AND ANAEROBIC Blood Culture adequate volume Performed at [facility], [address] [place], [place] [address]    Culture   Final    NO GROWTH < 12 HOURS Performed at [facility], [address] [place], [place] [address]    Report Status PENDING  Incomplete  Blood culture (routine x 2)     Status: None (Preliminary result)   Collection Time: 04/27/23 11:18 PM   Specimen: BLOOD  Result Value Ref Range Status  Specimen Description   Final    BLOOD BLOOD RIGHT ARM Performed at [facility], [address], [place], [place] [address]    Special Requests   Final    BOTTLES DRAWN AEROBIC AND ANAEROBIC Blood Culture adequate volume Performed at [facility], [address] [place], [place] [address]    Culture   Final    NO GROWTH < 12 HOURS Performed at [facility], [address] [place], [place] [address]    Report Status PENDING  Incomplete         Radiology Studies: CT ABDOMEN PELVIS W CONTRAST  Result Date: 04/27/2023 CLINICAL DATA:  Acute nonlocalized abdominal pain. Possible GI bleed. Sacral wound. EXAM: CT ABDOMEN AND PELVIS WITH CONTRAST TECHNIQUE: Multidetector CT imaging of the abdomen and pelvis was performed using the standard protocol following bolus administration of intravenous contrast. RADIATION DOSE  REDUCTION: This exam was performed according to the departmental dose-optimization program which includes automated exposure control, adjustment of the mA and/or kV according to patient size and/or use of iterative reconstruction technique. CONTRAST:  80mL OMNIPAQUE IOHEXOL 300 MG/ML  SOLN COMPARISON:  05/26/2021 FINDINGS: Lower chest: Large esophageal hiatal hernia. Small left pleural effusion with some basilar atelectasis. Hepatobiliary: No focal liver abnormality is seen. Status post cholecystectomy. No biliary dilatation. Pancreas: Unremarkable. No pancreatic ductal dilatation or surrounding inflammatory changes. Spleen: Normal in size without focal abnormality. Adrenals/Urinary Tract: No adrenal gland nodules. Kidneys are symmetrical. Nephrograms are homogeneous. No hydronephrosis or hydroureter. Bladder is decompressed with otherwise normal appearance. Stomach/Bowel: Stomach, small bowel, and colon are not abnormally distended. No wall thickening or inflammatory changes are appreciated. Prominent diverticulosis of the sigmoid colon. No definite evidence of acute diverticulitis. Vascular/Lymphatic: Extensive calcification of the aorta and branch vessels. No aneurysm. No significant lymphadenopathy. Reproductive: Uterus appears to be surgically absent. Small cystic structure in the left adnexum measuring 2.5 cm diameter likely representing an ovarian cyst. No change in appearance since prior study. Other: No free air or free fluid in the abdomen. Abdominal wall musculature appears intact. Musculoskeletal: Skin defect and subcutaneous soft tissue infiltration over the mid sacrum. This is consistent with sacral decubitus ulceration. Focal increased density within the defect may represent packing material. No definitive bone destruction to suggest osteomyelitis. No loculated collection. Prominent degenerative changes in the spine. Postoperative changes in both femurs. IMPRESSION: 1. No acute process demonstrated in  the abdomen or pelvis. No evidence of bowel obstruction or inflammation. Diverticulosis of the sigmoid colon without obvious evidence of acute diverticulitis. 2. Left ovarian simple-appearing cyst measuring 2.5 cm. No follow-up imaging is recommended. Reference: JACR 2020 Feb;17(2):248-254 3. Soft tissue infiltration and skin defect over the sacrum consistent with sacral decubitus ulceration. No loculated collection or definite bone erosion. Electronically Signed   By: Burman Nieves M.D.   On: 04/27/2023 18:05   DG Ankle Right Port  Result Date: 04/27/2023 CLINICAL DATA:  Right ankle swelling EXAM: PORTABLE RIGHT ANKLE - 2 VIEW COMPARISON:  05/02/2013 FINDINGS: ORIF of distal tibial and fibular fractures has been performed with ankylosis of the distal tibia and fibula. 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 Osseous structures are diffusely osteopenic. No acute fracture or dislocation. Dense vascular calcifications are noted. Mild bimalleolar soft tissue swelling noted, medial greater than lateral. No ankle effusion. IMPRESSION: 1. Bimalleolar soft tissue swelling. No acute fracture or dislocation. 2. 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 Electronically Signed   By: Lyda Kalata.D.  On: 04/27/2023 16:23   DG Chest Port 1 View  Result Date: 04/27/2023 CLINICAL DATA:  Edema right ankle EXAM: PORTABLE CHEST 1 VIEW COMPARISON:  Previous studies including the CT done on 2021-06-21 FINDINGS: Cardiac size is within normal limits. There are no signs of pulmonary edema. Linear densities are seen in left lower lung fields suggesting scarring or subsegmental atelectasis. There is blunting of both lateral CP angles. There is no pneumothorax. Pacemaker battery is seen in the left infraclavicular region. IMPRESSION: There are no signs of pulmonary edema. Linear densities in left lower lung field may suggest scarring or subsegmental  atelectasis. Blunting of both lateral CP angles may suggest small bilateral pleural effusions or pleural thickening. Electronically Signed   By: Ernie Avena M.D.   On: 04/27/2023 16:23        Scheduled Meds:  amiodarone  100 mg Oral Daily   donepezil  10 mg Oral QHS   famotidine  20 mg Oral Daily   gabapentin  300 mg Oral BID   pravastatin  80 mg Oral Daily   rivaroxaban  20 mg Oral Q supper   Continuous Infusions:  cefTRIAXone (ROCEPHIN)  IV Stopped (04/28/23 2218)          Glade Lloyd, MD [facility] 04/29/2023, 8:15 AM

## 2023-04-29 NOTE — Progress Notes (Signed)
PT Cancellation Note / Screen  Patient Details Name: Jill Bowman MRN: [id number] DOB: 1929/09/13   Cancelled Treatment:    Reason Eval/Treat Not Completed: PT screened, no needs identified, will sign off Pt with hx of dementia and per palliative note, "Prior to admission patient required total care assistance. Bedbound. Daughter states a family friend has gifted them a wheelchair, hospital bed, and bedside table after the passing of their mother. [name] states she has purchased air mattress."  Appears plan will likely be home with hospice.  Palliative care following.  Pt does not appear to have skilled PT needs at this time.   [name] 04/29/2023, 9:34 AM Paulino Door, DPT Physical Therapist Acute Rehabilitation Services Office: [phone]

## 2023-04-29 NOTE — Consult Note (Signed)
WOC Nurse Consult Note: Reason for Consult:Unstageable pressure injury to coccygeal area, mild irritant contact dermatitis to perianal area. Photo provided to EMR by Nursing. Please see photo. Wound type:pressure, irritant contact dermatitis  ICD-10 CM Codes for Irritant Dermatitis  L24A2 - Due to fecal, urinary or dual incontinence L24A9 - Due to friction or contact with other specified body fluids  L30.4  - Erythema intertrigo. Also used for abrasion of the hand, chafing of the skin, dermatitis due to sweating and friction, friction dermatitis, friction eczema, and genital/thigh intertrigo.   Pressure Injury POA: Yes Measurement:To be measured by Bedside RN and documented on Nursing Flow Sheet Wound bed: 80% yellow fibrinous material, 20% red Drainage (amount, consistency, odor) small serous to light yellow Periwound: with healing partial thickness skin loss to buttock, resolving erythema in perianal area Dressing procedure/placement/frequency:I have provided Nursing with guidance in the care of this wound using a daily cleanse followed by an application of leptospermum [place] honey (MediHoney).This is to be topped with dry gauze and secured with a silicone foam. Turning and repositioning has improved the areas of irritant contact dermatitis. This is an essential component of the POC. Guidance for Nursing to turn from side to side and minimize time in the supine position as well as keeping the [facility] at or below a 30 degree angle is provided.  WOC nursing team will not follow, but will remain available to this patient, the nursing and medical teams.  Please re-consult if needed.  Thank you for inviting [place] to participate in this patient's Plan of Care.  Ladona Mow, MSN, RN, CNS, GNP, Leda Min, [employer], [employer] phone:  [phone]

## 2023-04-29 NOTE — Plan of Care (Signed)
During this evening shift patient was becoming confused and agitated. She was wanting to get in and out of the bed. We allowed here to walk around in the room with assistance after an incontinent bowel movement. Confused as to wear she was, patient wanting to get out of bed stating her son was coming to get her. At bed time patient did not want to stay in the bed. This nurse sat in the patient room for quite a while for safety and reassurance to the patient for . Patient began to kick, hit and curse at this nurse. Daughter was called and message left on machine. Daughter called back and spoke to the patient on speaker phone,  I was told to give the patient her peppermint candy in the drawer. That calmed the patient for a little while, and she started the cursing and kicking again. NP notified and new order for medication and sitter given. Patient was given the ordered medication and shortly after she began to fall asleep. Sitter at the bedside.

## 2023-04-29 NOTE — Progress Notes (Signed)
PMT brief progress note Patient seen, noted to be resting comfortably, ate 50% of breakfast, safety sitter at bedside.  He is to have regular work of breathing.  Not agitated currently. Chart reviewed, overnight events noted-patient noted to be combative and agitated overnight. No family at bedside currently BP (!) 116/45 (BP Location: Right Arm)   Pulse (!) 57   Temp (!) 97.5 F (36.4 C) (Oral)   Resp 16   Ht 5\' 11"  (1.803 m)   Wt 80.9 kg   SpO2 99%   BMI 24.88 kg/m  Chart reviewed Medication history noted Agree with DNR, colleague Ms. Cousar-NP's PMD consult note reviewed.  She will follow-up with family on 04-30-2023.  Agree with addition of hospice services towards the end of this hospitalization. low MDM Rosalin Hawking, MD [place]

## 2023-04-30 ENCOUNTER — Ambulatory Visit: Payer: Medicare Other | Admitting: Podiatry

## 2023-04-30 DIAGNOSIS — G301 Alzheimer's disease with late onset: Secondary | ICD-10-CM | POA: Diagnosis not present

## 2023-04-30 DIAGNOSIS — G9341 Metabolic encephalopathy: Secondary | ICD-10-CM | POA: Diagnosis not present

## 2023-04-30 DIAGNOSIS — N3000 Acute cystitis without hematuria: Secondary | ICD-10-CM | POA: Diagnosis not present

## 2023-04-30 DIAGNOSIS — S31000A Unspecified open wound of lower back and pelvis without penetration into retroperitoneum, initial encounter: Secondary | ICD-10-CM | POA: Diagnosis not present

## 2023-04-30 DIAGNOSIS — L98421 Non-pressure chronic ulcer of back limited to breakdown of skin: Secondary | ICD-10-CM | POA: Diagnosis not present

## 2023-04-30 DIAGNOSIS — R5381 Other malaise: Secondary | ICD-10-CM | POA: Diagnosis not present

## 2023-04-30 LAB — MAGNESIUM: Magnesium: 1.7 mg/dL (ref 1.7–2.4)

## 2023-04-30 LAB — CBC WITH DIFFERENTIAL/PLATELET
Abs Immature Granulocytes: 0.07 10*3/uL (ref 0.00–0.07)
Basophils Absolute: 0 10*3/uL (ref 0.0–0.1)
Basophils Relative: 0 %
Eosinophils Absolute: 0.3 10*3/uL (ref 0.0–0.5)
Eosinophils Relative: 6 %
HCT: 31.6 % — ABNORMAL LOW (ref 36.0–46.0)
Hemoglobin: 9.6 g/dL — ABNORMAL LOW (ref 12.0–15.0)
Immature Granulocytes: 1 %
Lymphocytes Relative: 25 %
Lymphs Abs: 1.4 10*3/uL (ref 0.7–4.0)
MCH: 29.2 pg (ref 26.0–34.0)
MCHC: 30.4 g/dL (ref 30.0–36.0)
MCV: 96 fL (ref 80.0–100.0)
Monocytes Absolute: 0.5 10*3/uL (ref 0.1–1.0)
Monocytes Relative: 9 %
Neutro Abs: 3.2 10*3/uL (ref 1.7–7.7)
Neutrophils Relative %: 59 %
Platelets: 249 10*3/uL (ref 150–400)
RBC: 3.29 MIL/uL — ABNORMAL LOW (ref 3.87–5.11)
RDW: 14.7 % (ref 11.5–15.5)
WBC: 5.4 10*3/uL (ref 4.0–10.5)
nRBC: 0 % (ref 0.0–0.2)

## 2023-04-30 LAB — BASIC METABOLIC PANEL
Anion gap: 5 (ref 5–15)
BUN: 14 mg/dL (ref 8–23)
CO2: 25 mmol/L (ref 22–32)
Calcium: 7.8 mg/dL — ABNORMAL LOW (ref 8.9–10.3)
Chloride: 108 mmol/L (ref 98–111)
Creatinine, Ser: 0.85 mg/dL (ref 0.44–1.00)
GFR, Estimated: >60 mL/min (ref >60)
Glucose, Bld: 81 mg/dL (ref 70–99)
Potassium: 3.8 mmol/L (ref 3.5–5.1)
Sodium: 138 mmol/L (ref 135–145)

## 2023-04-30 LAB — URINE CULTURE: Culture: <10000 — AB

## 2023-04-30 NOTE — Progress Notes (Addendum)
Daily Progress Note   Patient Name: Jill Bowman       Date: 04/30/2023 DOB: [birth year]  Age: 89 y.o. MRN#: [id number] Attending Physician: Jill Lloyd, MD Primary Care Physician: Jill Felts, FNP Admit Date: 04/27/2023  Reason for Consultation/Follow-up: Establishing goals of care, Non pain symptom management, and Psychosocial/spiritual support  Subjective: Chart Reviewed. Updates Received.   Patient has been comfortable throughout the day. Daughter stayed overnight due to patient becoming agitated and attempting to get out of bed. Jill Bowman shares patient is ready to get back home in her own environment.   We discussed at length previous goals of care discussions. Daughter confirms family would like to pursue home hospice at discharge. Education provided on expectations of referral and support in the home. TOC to provide choice. Would need to consider if patient will require inpatient hospice as mentioned in initial note when arranging home supportive services. Patient will require EMS transport and home equipment which can be serviced and covered under hospice's benefits.   All questions answered and support provided.    Length of Stay: 1 day  Vital Signs: BP (!) 119/42 (BP Location: Right Arm)   Pulse (!) 58   Temp 97.7 F (36.5 C) (Oral)   Resp 18   Ht 5\' 11"  (1.803 m)   Wt 80.9 kg   SpO2 95%   BMI 24.88 kg/m  SpO2: SpO2: 95 % O2 Device: O2 Device: Room Air O2 Flow Rate:   Last Weight  Most recent update: 04/28/2023  8:24 AM    Weight  80.9 kg (178 lb 5.6 oz)             Intake/Output Summary (Last 24 hours) at 04/30/2023 4098 Last data filed at 04/30/2023 0935 Gross per 24 hour  Intake 600 ml  Output 750 ml  Net -150 ml    Physical Exam: Gen:  Elderly, chronically appearing. CV: Regular rate and rhythm, no murmurs rubs or gallops PULM: clear to auscultation bilaterally. No wheezes/rales/rhonchi ABD: soft/nontender/nondistended/normal bowel sounds EXT: No  edema Neuro: Alert and oriented to self and family    Palliative Care Assessment & Plan  HPI: Palliative Care consult requested for goals of care discussion in this 89 y.o. female  with past medical history of hypertension, dementia, atrial fibrillation, depression, CAD s/p stent, SSS s/p PPM, arthritis, and Alzheimer disease. She was admitted on 04/27/2023 from home with sacral wound and pain.  CT of abdomen/pelvis showed soft tissue infiltration consistent with sacral decubitus.   Code Status: DNR  Recommendations/Plan: DNR/DNI-completed form on chart Continue with current plan of care per medical team while hospitalized with goal of discharging home with hospice support.  TOC referral for outpatient hospice. Patient will need PTAR transport and home DMe covered under hospice's benefits once arranged.  PMT will continue to support and follow on as needed basis. Please secure chat for urgent needs.   Symptom Management: Per attending would consider home prescription for haldol orally for agitation  Thank you for allowing the Palliative Medicine Team to assist in the care of this patient.  Palliative Medicine Team providers are available by phone from 7am to 7pm daily and can be reached through the team cell phone. Should this patient require assistance outside of these hours, please call the patient's attending physician.  Any controlled substances utilized were prescribed in the context of palliative care. PDMP has been reviewed.  Visit consisted of counseling and education dealing with the complex and emotionally intense issues of symptom management  and palliative care in the setting of serious and potentially life-threatening illness.Greater than 50%  of this time was spent counseling and coordinating care related to the above assessment and plan.  Jill Bowman, AGPCNP-BC  Palliative Medicine [facility]WL [facility] Center  [phone]  *Please note that this is a  verbal dictation therefore any spelling or grammatical errors are due to the "Dragon Medical One" system interpretation.

## 2023-04-30 NOTE — Progress Notes (Signed)
    Durable Medical Equipment  (From admission, onward)           Start     Ordered   04/30/23 1437  For home use only DME lightweight manual wheelchair with seat cushion  Once       Comments: Patient suffers from dementia, cancer, sacral wounds which impairs their ability to perform daily activities like bathing, dressing, feeding, grooming, and toileting in the home.  A cane, crutch, or walker will not resolve  issue with performing activities of daily living. A wheelchair will allow patient to safely perform daily activities. Patient is not able to propel themselves in the home using a standard weight wheelchair due to general weakness. Patient can self propel in the lightweight wheelchair. Length of need lifetime. Accessories: elevating leg rests (ELRs), wheel locks, extensions and anti-tippers.   04/30/23 1439   04/30/23 1435  For home use only DME Shower stool  Once        04/30/23 1439   04/30/23 1427  For home use only DME Hospital bed  Once       Question Answer Comment  Length of Need 12 Months   Patient has (list medical condition): sacral wound   The above medical condition requires: Patient requires the ability to reposition frequently   Head must be elevated greater than: 45 degrees   Bed type Semi-electric   Support Surface: Alternating Pressure Pad and Pump      04/30/23 1433

## 2023-04-30 NOTE — Progress Notes (Signed)
Patient suffers from dementia, cancer, sacral wounds which impairs their ability to perform daily activities like bathing, dressing, feeding, grooming, and toileting in the home.  A cane, crutch, or walker will not resolve issue with performing activities of daily living. A wheelchair will allow patient to safely perform daily activities. Patient is not able to propel themselves in the home using a standard weight wheelchair due to general weakness. Patient can self propel in the lightweight wheelchair. Length of need lifetime. Accessories: elevating leg rests (ELRs), wheel locks, extensions and anti-tippers.

## 2023-04-30 NOTE — Progress Notes (Signed)
PROGRESS NOTE    Jill Bowman  ONG:[id number] DOB: Jun 08, 1929 DOA: 04/27/2023 PCP: Arnette Felts, FNP   Brief Narrative:   89 y.o. female with medical history significant of CAD s/p stent in [place] in 2005, SSS s/p PPM, HTN, dementia, CKD stage 3,  hx of breast cancer, atrial fibrillation, GERD, depression was brought in by daughter with concerns for sacral wound bleeding and ankle pain.  On presentation, vitals were stable; fecal occult blood was negative; hemoglobin was 9.8.  UA was suggestive of UTI.  Chest x-ray showed no acute findings.  Right ankle x-ray showed bimalleolar soft tissue swelling but no fracture.  CT of abdomen and pelvis showed no acute findings; soft tissue infiltration and skin defect over the sacrum consistent with sacral decubitus ulceration with no loculated collection or definite bone erosion.  She was started on IV antibiotics.  Palliative care was consulted.  Assessment & Plan:   Unstageable pressure injury to sacral/coccygeal area: Present on admission -Daughter was concerned about bleeding from her sacral wound. -Imaging as above. -Wound care as per wound care consult recommendations. -Frequent turns in bed. -Palliative care consulted.  CODE STATUS has been changed to DNR by palliative care team -Fecal occult blood was negative and hemoglobin has remained stable.  Doubt that patient had any rectal bleeding.  UTI: Present on admission -Continue Rocephin.  Follow cultures.  Normocytic anemia/anemia of chronic disease -Hemoglobin stable.  Monitor intermittently.  Transfuse if hemoglobin less than 7  Right ankle pain -History of recurrent fractures and right ankle.  Followed by Dr. Magnus Ivan -No signs of acute fracture on x-ray. -Uric acid not elevated. -Patient is mostly bedbound hence PT did not evaluate the patient  Dementia without behavioral disturbances  Agitation/delirium Goals of care -Delirium/fall precautions.  Continue IV Haldol as needed for  agitation. -Continue Aricept -Palliative care following: Will benefit from outpatient palliative care follow-up versus home hospice  CAD -Stable.  Outpatient follow-up with Dr. Jacinto Halim -Continue medical management with high intensity statin, metoprolol  Chronic atrial fibrillation -Mildly bradycardic.  Continue amiodarone and Xarelto.  Hold beta-blocker because of blood pressure being on the lower side  Hypertension -Blood pressure mostly on the lower side.  Hold amlodipine and beta-blocker  Diabetes mellitus type 2 -A1c 5.2 in 10/2022.  Continue carb modified diet.  Blood sugars stable  Hyperlipidemia -Continue high intensity statin  CKD stage IIIa -Creatinine stable.   DVT prophylaxis: Xarelto Code Status: Full Family Communication: Daughter at bedside Disposition Plan: Status is:  inpatient because: Of severity of illness.  Need for IV antibiotics.  Consultants: Palliative care  Procedures: None  Antimicrobials: Rocephin from 04/27/2023 onwards   Subjective: Patient seen and examined at bedside.  No fever, seizures, agitation reported. Objective: Vitals:   04/29/23 0553 04/29/23 1344 04/29/23 2054 04/30/23 0518  BP: (!) 116/45 (!) 135/45 (!) 108/46 (!) 119/42  Pulse: (!) 57 (!) 58 60 (!) 58  Resp: 16 20 16 18   Temp: (!) 97.5 F (36.4 C) 98 F (36.7 C) 97.8 F (36.6 C) 97.7 F (36.5 C)  TempSrc: Oral Oral Oral Oral  SpO2: 99% 98% 95% 95%  Weight:      Height:        Intake/Output Summary (Last 24 hours) at 04/30/2023 0753 Last data filed at 04/29/2023 1850 Gross per 24 hour  Intake 480 ml  Output 1500 ml  Net -1020 ml    Filed Weights   04/28/23 0815  Weight: 80.9 kg    Examination:  General: No  acute distress.  On room air currently.  Looks chronically ill and deconditioned.  Elderly female lying in bed. ENT/neck: No obvious JVD elevation or palpable neck masses noted  respiratory: Bilateral decreased breath sounds at bases with scattered crackles   CVS: Mild intermittent bradycardia present; S1 and S2 are heard  abdominal: Soft, nontender, distended mildly; no organomegaly, bowel sounds are heard normally Extremities: No clubbing; mild lower extremity edema present  CNS: Awake.  Extremity poor historian.  Confused.  No obvious focal deficits noted  lymph: No obvious palpable lymphadenopathy Skin: No obvious rashes/petechiae psych: Very flat affect.  Shows no signs of agitation currently Musculoskeletal: No obvious joint erythema/tenderness   Data Reviewed: I have personally reviewed following labs and imaging studies  CBC: Recent Labs  Lab 04/27/23 1602 04/27/23 1856 04/28/23 0142 04/28/23 0711 04/29/23 0546 04/30/23 0543  WBC 8.3  --  7.0 5.6 5.6 5.4  NEUTROABS 5.5  --   --   --  3.1 3.2  HGB 9.8* 9.3* 8.8* 9.0* 9.4* 9.6*  HCT 31.9* 30.1* 29.0* 29.6* 30.1* 31.6*  MCV 93.8  --  96.0 94.9 94.4 96.0  PLT 278  --  275 262 261 249    Basic Metabolic Panel: Recent Labs  Lab 04/27/23 1602 04/28/23 0711 04/29/23 0546 04/30/23 0543  NA 142 138 140 138  K 4.2 3.7 3.7 3.8  CL 107 106 109 108  CO2 27 26 27 25   GLUCOSE 110* 75 88 81  BUN 17 14 14 14   CREATININE 0.88 0.70 0.89 0.85  CALCIUM 8.3* 7.8* 7.9* 7.8*  MG  --   --  1.9 1.7    GFR: Estimated Creatinine Clearance: 48.2 mL/min (by C-G formula based on SCr of 0.85 mg/dL). Liver Function Tests: Recent Labs  Lab 04/27/23 1602  AST 17  ALT 5  ALKPHOS 89  BILITOT 0.3  PROT 6.7  ALBUMIN 3.2*    Recent Labs  Lab 04/27/23 1602  LIPASE 20    No results for input(s): "AMMONIA" in the last 168 hours. Coagulation Profile: Recent Labs  Lab 04/27/23 1602  INR 2.1*    Cardiac Enzymes: No results for input(s): "CKTOTAL", "CKMB", "CKMBINDEX", "TROPONINI" in the last 168 hours. BNP (last 3 results) No results for input(s): "PROBNP" in the last 8760 hours. HbA1C: No results for input(s): "HGBA1C" in the last 72 hours. CBG: No results for input(s):  "GLUCAP" in the last 168 hours. Lipid Profile: No results for input(s): "CHOL", "HDL", "LDLCALC", "TRIG", "CHOLHDL", "LDLDIRECT" in the last 72 hours. Thyroid Function Tests: No results for input(s): "TSH", "T4TOTAL", "FREET4", "T3FREE", "THYROIDAB" in the last 72 hours. Anemia Panel: No results for input(s): "VITAMINB12", "FOLATE", "FERRITIN", "TIBC", "IRON", "RETICCTPCT" in the last 72 hours. Sepsis Labs: No results for input(s): "PROCALCITON", "LATICACIDVEN" in the last 168 hours.  Recent Results (from the past 240 hour(s))  Blood culture (routine x 2)     Status: None (Preliminary result)   Collection Time: 04/27/23 11:12 PM   Specimen: BLOOD  Result Value Ref Range Status   Specimen Description   Final    BLOOD BLOOD RIGHT ARM Performed at [facility], [address] [place], [place] [address]    Special Requests   Final    BOTTLES DRAWN AEROBIC AND ANAEROBIC Blood Culture adequate volume Performed at [facility], [address] [place], [place] [address]    Culture   Final    NO GROWTH 1 DAY Performed at [facility],  [address] [place], [place] [address]    Report Status PENDING  Incomplete  Blood culture (routine x 2)     Status: None (Preliminary result)   Collection Time: 04/27/23 11:18 PM   Specimen: BLOOD  Result Value Ref Range Status   Specimen Description   Final    BLOOD BLOOD RIGHT ARM Performed at [facility], [address], [place], [place] [address]    Special Requests   Final    BOTTLES DRAWN AEROBIC AND ANAEROBIC Blood Culture adequate volume Performed at [facility], [address], [place], [place] [address]    Culture   Final    NO GROWTH 1 DAY Performed at [facility], [address] [place], [place] [address]    Report Status PENDING  Incomplete         Radiology Studies: No results found.      Scheduled Meds:  amiodarone  100 mg  Oral Daily   diclofenac Sodium  4 g Topical QID   donepezil  10 mg Oral QHS   famotidine  20 mg Oral Daily   gabapentin  300 mg Oral BID   leptospermum manuka honey  1 Application Topical Daily   pravastatin  80 mg Oral Daily   rivaroxaban  20 mg Oral Q supper   Continuous Infusions:  cefTRIAXone (ROCEPHIN)  IV 1 g (04/29/23 2020)          Glade Lloyd, MD [facility] 04/30/2023, 7:53 AM

## 2023-04-30 NOTE — TOC Initial Note (Addendum)
Transition of Care [facility]) - Initial/Assessment Note    Patient Details  Name: Jill Bowman MRN: [id number] Date of Birth: 11-08-1929  [facility]) CM/SW Contact:    Beckie Busing, RN Phone [phone]  04/30/2023, 12:09 PM  Clinical Narrative:   TOC following patient  with high risk for readmission.  Cm at bedside introduces self and explains role. Daughter is present. Patient is alert to self and daughter Jill Bowman is at bedside. Daughter states that patient comes from home where the daughter provides care. Daughter reports that patient does have PCP Penelope Galas). Patient does follow up with PCP on a regular basis. [facility]. Per  daughter patient has access to medications  are assessable and affordable. Daughter reports that the only DME the patient has is a walker. Patient has private caregiver that was recently hired. Daughter states she only worked 2 days before patient was admitted into the hospital. Daughter states that patient has transportation and that her brother and sister assist with transportation as needed.          TOC acknowledges consult for Hospice. CM at bedside with daughter Jill Bowman to offer choice. Daughter has no preference, Hospice referral accepted by Clearence Ped with [facility].   Expected Discharge Plan: Home w Hospice Care Barriers to Discharge: Continued Medical Work up   Patient Goals and CMS Choice Patient states their goals for this hospitalization and ongoing recovery are:: Per daughter goal is to get patient home with hospice CMS Medicare.gov Compare Post Acute Care list provided to:: Patient Represenative (must comment) (daughter Jill Bowman) Choice offered to / list presented to : Adult Children      Expected Discharge Plan and Services                                              Prior Living Arrangements/Services                       Activities of Daily Living Home Assistive  Devices/Equipment: Wheelchair, Environmental consultant (specify type), Eyeglasses ADL Screening (condition at time of admission) Patient's cognitive ability adequate to safely complete daily activities?: No Is the patient deaf or have difficulty hearing?: No Does the patient have difficulty seeing, even when wearing glasses/contacts?: No Does the patient have difficulty concentrating, remembering, or making decisions?: Yes Patient able to express need for assistance with ADLs?: Yes Does the patient have difficulty dressing or bathing?: Yes Independently performs ADLs?: No Communication: Needs assistance Is this a change from baseline?: Pre-admission baseline Dressing (OT): Needs assistance Is this a change from baseline?: Pre-admission baseline Grooming: Needs assistance Is this a change from baseline?: Pre-admission baseline Feeding: Needs assistance Is this a change from baseline?: Pre-admission baseline Bathing: Needs assistance Is this a change from baseline?: Pre-admission baseline Toileting: Needs assistance Is this a change from baseline?: Pre-admission baseline In/Out Bed: Needs assistance Walks in Home: Needs assistance Is this a change from baseline?: Pre-admission baseline Does the patient have difficulty walking or climbing stairs?: Yes Weakness of Legs: Both Weakness of Arms/Hands: Both  Permission Sought/Granted                  Emotional Assessment              Admission diagnosis:  Physical deconditioning [R53.81] Cystitis [N30.90] Skin ulcer of sacrum, limited to  breakdown of skin (HCC) [L98.421] Acute metabolic encephalopathy [G93.41] UTI (urinary tract infection) [N39.0] Patient Active Problem List   Diagnosis Date Noted   Palliative care by specialist 04/29/2023   Atrial fibrillation, chronic (HCC) 04/28/2023   Sacral wound 04/28/2023   Right ankle pain 04/28/2023   Normocytic anemia 04/28/2023   UTI (urinary tract infection) 04/28/2023   Abnormal glucose  05/25/2020   Type 2 diabetes mellitus (HCC) 04/26/2020   CKD (chronic kidney disease), stage III (HCC) 04/26/2020   Transient ischemic attack (TIA) 04/12/2020   Progressive dementia with uncertain etiology (HCC) 04/12/2020   Dementia due to medical condition with behavioral disturbance (HCC) 04/12/2020   Paranoid reaction (HCC) 04/12/2020   Encounter for care of pacemaker 09/12/2019   Stented coronary artery 11/07/2018   Late onset Alzheimer's disease without behavioral disturbance (HCC)    GERD (gastroesophageal reflux disease) 01/17/2018   Atrial tachycardia 01/17/2018   HLD (hyperlipidemia) 01/17/2018   Breast cancer of lower-outer quadrant of right female breast (HCC) 01/03/2016   Bradycardia 10/19/2014   Physical deconditioning 09/30/2012   CAD (coronary artery disease) 07/16/2012   Pacemaker  Dual chamber Medtronic Adapta L ADDRL1  07/16/2012   Morbid obesity (HCC) 07/16/2012   Degloving injury of lower leg, Left 07/04/2012   Tachycardia-bradycardia syndrome (HCC)    PCP:  Arnette Felts, FNP Pharmacy:   [facility] [place], [place] - [address] [place] [address] Phone: [phone] Fax: [phone]  [facility] [address] [place], [place] - [address] [place] [address] Phone: [phone] Fax: [phone]     Social Determinants of Health (SDOH) Social History: SDOH Screenings   Food Insecurity: No Food Insecurity (04/29/2023)  Housing: Low Risk  (04/28/2023)  Transportation Needs: No Transportation Needs (04/28/2023)  Utilities: Not At Risk (04/28/2023)  Depression (PHQ2-9): Low Risk  (10/07/2020)  Financial Resource Strain: Low Risk  (10/13/2021)  Physical Activity: Inactive (10/13/2021)  Stress: Stress Concern Present (10/13/2021)  Tobacco Use: Medium Risk (04/27/2023)   SDOH Interventions:     Readmission Risk Interventions    04/30/2023   10:49 AM  Readmission Risk  Prevention Plan  Transportation Screening Complete  PCP or Specialist Appt within 3-5 Days Complete  HRI or Home Care Consult Complete  Social Work Consult for Recovery Care Planning/Counseling Complete  Palliative Care Screening Complete  Medication Review Oceanographer) Referral to Pharmacy

## 2023-05-01 DIAGNOSIS — N3000 Acute cystitis without hematuria: Secondary | ICD-10-CM | POA: Diagnosis not present

## 2023-05-01 DIAGNOSIS — G9341 Metabolic encephalopathy: Secondary | ICD-10-CM | POA: Diagnosis not present

## 2023-05-01 DIAGNOSIS — S31000A Unspecified open wound of lower back and pelvis without penetration into retroperitoneum, initial encounter: Secondary | ICD-10-CM | POA: Diagnosis not present

## 2023-05-01 LAB — CULTURE, BLOOD (ROUTINE X 2): Special Requests: ADEQUATE

## 2023-05-01 MED ORDER — CEPHALEXIN 500 MG PO CAPS: 500.0000 mg | ORAL_CAPSULE | Freq: Three times a day (TID) | ORAL | 0 refills | Status: AC

## 2023-05-01 MED ORDER — MEDIHONEY WOUND/BURN DRESSING EX PSTE: 1.0000 | PASTE | Freq: Every day | CUTANEOUS | 0 refills | Status: DC

## 2023-05-01 NOTE — Discharge Summary (Signed)
Physician Discharge Summary  Jill Bowman UJW:[id number] DOB: [birth year] DOA: 04/27/2023  PCP: Arnette Felts, FNP  Admit date: 04/27/2023 Discharge date: 05/01/2023  Admitted From: Home Disposition: Home with hospice  Recommendations for Outpatient Follow-up:  Follow up with home hospice at earliest convenience   Home Health: No Equipment/Devices: None  Discharge Condition: Poor CODE STATUS: DNR Diet recommendation: As tolerated  Brief/Interim Summary:  89 y.o. female with medical history significant of CAD s/p stent in [place] in 2005, SSS s/p PPM, HTN, dementia, CKD stage 3,  hx of breast cancer, atrial fibrillation, GERD, depression was brought in by daughter with concerns for sacral wound bleeding and ankle pain.  On presentation, vitals were stable; fecal occult blood was negative; hemoglobin was 9.8.  UA was suggestive of UTI.  Chest x-ray showed no acute findings.  Right ankle x-ray showed bimalleolar soft tissue swelling but no fracture.  CT of abdomen and pelvis showed no acute findings; soft tissue infiltration and skin defect over the sacrum consistent with sacral decubitus ulceration with no loculated collection or definite bone erosion.  She was started on IV antibiotics.  Palliative care was consulted.  Urine culture grew less than 10,000 colonies per mL of insignificant growth.  Family has decided to pursue home hospice.  She will be discharged home today with home hospice if arrangements can be made.  Discharge Diagnoses:  Unstageable pressure injury to sacral/coccygeal area: Present on admission -Daughter was concerned about bleeding from her sacral wound. -Imaging as above. -Wound care as per wound care consult recommendations. -Frequent turns in bed. -Fecal occult blood was negative and hemoglobin has remained stable.  Doubt that patient had any rectal bleeding. -No more bleeding since admission   UTI: Present on admission -Currently on Rocephin.  Urine culture grew less  than 10,000 colonies per mL of insignificant growth.  Discharge on oral Keflex for 3 more days.   Normocytic anemia/anemia of chronic disease -Hemoglobin stable during the hospitalization  Right ankle pain -History of recurrent fractures and right ankle.  Followed by Dr. Magnus Ivan -No signs of acute fracture on x-ray. -Uric acid not elevated. -Patient is mostly bedbound hence PT did not evaluate the patient   Dementia without behavioral disturbances  Agitation/delirium Goals of care -Continue Aricept -Palliative care following - Family has decided to pursue home hospice.  She will be discharged home today with home hospice if arrangements can be made.   CAD -Stable.  Outpatient follow-up with Dr. Jacinto Halim -Continue medical management with high intensity statin, metoprolol   Chronic atrial fibrillation -Mildly bradycardic.  Continue amiodarone and Xarelto.  Hold beta-blocker because of blood pressure being on the lower side   Hypertension -Blood pressure mostly on the lower side.  Hold amlodipine and beta-blocker.  Outpatient follow-up.   Diabetes mellitus type 2 -A1c 5.2 in 10/2022.   Blood sugars stable   Hyperlipidemia -Continue high intensity statin   CKD stage IIIa -Creatinine stable.   Discharge Instructions  Discharge Instructions     Diet - low sodium heart healthy   Complete by: As directed    DIET DYS 3 Room service appropriate? Yes; Fluid consistency: Thin  Diet effective now      Question Answer Comment Room service appropriate? Yes   Fluid consistency: Thin   Discharge wound care:   Complete by: As directed    As per wound care RN recommendations: Wound care  Daily      Comments: Wound care to Unstageable pressure injury to coccyx (POA): Cleanse with  soap and water, rinse and pat dry. Apply MediHoney, top with dry gauze and secure with silicone foam for sacrum. Turn and reposition from side to side, minimize time in the supine position. Keep HOB at or below  a 30 degree angle.  04/29/23 1816   Increase activity slowly   Complete by: As directed       Allergies as of 05/01/2023       Reactions   Codeine Itching, Rash, Other (See Comments)   Full body rash    Hydroxyzine Other (See Comments)   Extreme confusion and hallucinations   Lorazepam Other (See Comments)   Extreme confusion, hallucinations and hyperactivity   Penicillins Anaphylaxis, Hives, Shortness Of Breath   Tolerated cefazolin and ceftriaxone in 2013   Seroquel [quetiapine Fumarate] Other (See Comments)   Daughter states medication causes hallucinations   Sulfa Antibiotics Shortness Of Breath   Zinc Itching   Ciprofloxacin Other (See Comments)   unknown   Ciprofloxacin Hives, Other (See Comments)   "think I break out in welts"   Latex Rash, Other (See Comments)   Tears skin    Penicillins Other (See Comments)   Unknown    Sulfa Antibiotics Other (See Comments)   unknown        Medication List     STOP taking these medications    amLODipine 5 MG tablet Commonly known as: NORVASC   ASPIRIN 81 PO   metoprolol succinate 25 MG 24 hr tablet Commonly known as: TOPROL-XL       TAKE these medications    acetaminophen 500 MG tablet Commonly known as: TYLENOL Take 500 mg by mouth every 6 (six) hours as needed for mild pain.   acidophilus Caps capsule Take 1 capsule by mouth daily.   amiodarone 100 MG tablet Commonly known as: PACERONE TAKE ONE TABLET BY MOUTH EVERY MORNING   busPIRone 5 MG tablet Commonly known as: BUSPAR Take 5 mg by mouth 2 (two) times daily as needed.   carboxymethylcellulose 0.5 % Soln Commonly known as: REFRESH PLUS Place 1 drop into both eyes 3 (three) times daily as needed (dry eyes).   cephALEXin 500 MG capsule Commonly known as: KEFLEX Take 1 capsule (500 mg total) by mouth 3 (three) times daily for 3 days.   docusate sodium 100 MG capsule Commonly known as: COLACE Take 200 mg by mouth at bedtime.   donepezil 10 MG  tablet Commonly known as: ARICEPT Take 1 tablet (10 mg total) by mouth at bedtime. Please call and make overdue appt for further refills, 1 st attempt   famotidine 20 MG tablet Commonly known as: PEPCID Take 20 mg by mouth daily.   gabapentin 300 MG capsule Commonly known as: NEURONTIN TAKE ONE CAPSULE BY MOUTH TWICE DAILY   leptospermum manuka honey Pste paste Apply 1 Application topically daily. Apply as directed to Unstageable Pressure Injury to coccyx after cleansing. Top with dry dressing. Apply thin layer (3 mm) to wound.   loratadine 10 MG tablet Commonly known as: CLARITIN Take 10 mg by mouth daily as needed for allergies.   nitroGLYCERIN 0.4 MG SL tablet Commonly known as: NITROSTAT dissolve ONE UNDER THE TONGUE EVERY FIVE minutes FOR not more THAN THREE doses   Nyamyc powder Generic drug: nystatin Apply 1 Application topically every 8 (eight) hours as needed.   pantoprazole 40 MG tablet Commonly known as: PROTONIX Take 40 mg by mouth daily.   pravastatin 80 MG tablet Commonly known as: PRAVACHOL Take 1 tablet (80  mg total) by mouth daily. What changed: when to take this   Vitamin D3 50 MCG (2000 UT) capsule Take 2,000 Units by mouth daily.   Xarelto 20 MG Tabs tablet Generic drug: rivaroxaban TAKE 1 TABLET BY MOUTH ONCE DAILY WITH SUPPER               Durable Medical Equipment  (From admission, onward)           Start     Ordered   04/30/23 1437  For home use only DME lightweight manual wheelchair with seat cushion  Once       Comments: Patient suffers from dementia, cancer, sacral wounds which impairs their ability to perform daily activities like bathing, dressing, feeding, grooming, and toileting in the home.  A cane, crutch, or walker will not resolve  issue with performing activities of daily living. A wheelchair will allow patient to safely perform daily activities. Patient is not able to propel themselves in the home using a standard  weight wheelchair due to general weakness. Patient can self propel in the lightweight wheelchair. Length of need lifetime. Accessories: elevating leg rests (ELRs), wheel locks, extensions and anti-tippers.   04/30/23 1439   04/30/23 1435  For home use only DME Shower stool  Once        04/30/23 1439   04/30/23 1427  For home use only DME Hospital bed  Once       Question Answer Comment  Length of Need 12 Months   Patient has (list medical condition): sacral wound   The above medical condition requires: Patient requires the ability to reposition frequently   Head must be elevated greater than: 45 degrees   Bed type Semi-electric   Support Surface: Alternating Pressure Pad and Pump      04/30/23 1433              Discharge Care Instructions  (From admission, onward)           Start     Ordered   05/01/23 0000  Discharge wound care:       Comments: As per wound care RN recommendations: Wound care  Daily      Comments: Wound care to Unstageable pressure injury to coccyx (POA): Cleanse with soap and water, rinse and pat dry. Apply MediHoney, top with dry gauze and secure with silicone foam for sacrum. Turn and reposition from side to side, minimize time in the supine position. Keep HOB at or below a 30 degree angle.  04/29/23 1816   05/01/23 0933            Follow-up Information     Arnette Felts, FNP. Schedule an appointment as soon as possible for a visit in 3 days.   Specialty: General Practice Why: for recheck Contact information: [address] [place] [address] [phone]                Allergies  Allergen Reactions   Codeine Itching, Rash and Other (See Comments)    Full body rash    Hydroxyzine Other (See Comments)    Extreme confusion and hallucinations   Lorazepam Other (See Comments)    Extreme confusion, hallucinations and hyperactivity   Penicillins Anaphylaxis, Hives and Shortness Of Breath    Tolerated cefazolin and  ceftriaxone in 2013   Seroquel [Quetiapine Fumarate] Other (See Comments)    Daughter states medication causes hallucinations    Sulfa Antibiotics Shortness Of Breath   Zinc Itching  Ciprofloxacin Other (See Comments)    unknown   Ciprofloxacin Hives and Other (See Comments)    "think I break out in welts"    Latex Rash and Other (See Comments)    Tears skin    Penicillins Other (See Comments)    Unknown    Sulfa Antibiotics Other (See Comments)    unknown    Consultations: Palliative care   Procedures/Studies: CT ABDOMEN PELVIS W CONTRAST  Result Date: 04/27/2023 CLINICAL DATA:  Acute nonlocalized abdominal pain. Possible GI bleed. Sacral wound. EXAM: CT ABDOMEN AND PELVIS WITH CONTRAST TECHNIQUE: Multidetector CT imaging of the abdomen and pelvis was performed using the standard protocol following bolus administration of intravenous contrast. RADIATION DOSE REDUCTION: This exam was performed according to the departmental dose-optimization program which includes automated exposure control, adjustment of the mA and/or kV according to patient size and/or use of iterative reconstruction technique. CONTRAST:  80mL OMNIPAQUE IOHEXOL 300 MG/ML  SOLN COMPARISON:  05/26/2021 FINDINGS: Lower chest: Large esophageal hiatal hernia. Small left pleural effusion with some basilar atelectasis. Hepatobiliary: No focal liver abnormality is seen. Status post cholecystectomy. No biliary dilatation. Pancreas: Unremarkable. No pancreatic ductal dilatation or surrounding inflammatory changes. Spleen: Normal in size without focal abnormality. Adrenals/Urinary Tract: No adrenal gland nodules. Kidneys are symmetrical. Nephrograms are homogeneous. No hydronephrosis or hydroureter. Bladder is decompressed with otherwise normal appearance. Stomach/Bowel: Stomach, small bowel, and colon are not abnormally distended. No wall thickening or inflammatory changes are appreciated. Prominent diverticulosis of the sigmoid  colon. No definite evidence of acute diverticulitis. Vascular/Lymphatic: Extensive calcification of the aorta and branch vessels. No aneurysm. No significant lymphadenopathy. Reproductive: Uterus appears to be surgically absent. Small cystic structure in the left adnexum measuring 2.5 cm diameter likely representing an ovarian cyst. No change in appearance since prior study. Other: No free air or free fluid in the abdomen. Abdominal wall musculature appears intact. Musculoskeletal: Skin defect and subcutaneous soft tissue infiltration over the mid sacrum. This is consistent with sacral decubitus ulceration. Focal increased density within the defect may represent packing material. No definitive bone destruction to suggest osteomyelitis. No loculated collection. Prominent degenerative changes in the spine. Postoperative changes in both femurs. IMPRESSION: 1. No acute process demonstrated in the abdomen or pelvis. No evidence of bowel obstruction or inflammation. Diverticulosis of the sigmoid colon without obvious evidence of acute diverticulitis. 2. Left ovarian simple-appearing cyst measuring 2.5 cm. No follow-up imaging is recommended. Reference: JACR 2020 Feb;17(2):248-254 3. Soft tissue infiltration and skin defect over the sacrum consistent with sacral decubitus ulceration. No loculated collection or definite bone erosion. Electronically Signed   By: Burman Nieves M.D.   On: 04/27/2023 18:05   DG Ankle Right Port  Result Date: 04/27/2023 CLINICAL DATA:  Right ankle swelling EXAM: PORTABLE RIGHT ANKLE - 2 VIEW COMPARISON:  05/02/2013 FINDINGS: ORIF of distal tibial and fibular fractures has been performed with ankylosis of the distal tibia and fibula. 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 Osseous structures are diffusely osteopenic. No acute fracture or dislocation. Dense vascular calcifications are noted. Mild bimalleolar soft tissue swelling noted, medial greater than  lateral. No ankle effusion. IMPRESSION: 1. Bimalleolar soft tissue swelling. No acute fracture or dislocation. 2. 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 Electronically Signed   By: Helyn Numbers M.D.   On: 04/27/2023 16:23   DG Chest Port 1 View  Result Date: 04/27/2023 CLINICAL DATA:  Edema right ankle EXAM: PORTABLE CHEST 1 VIEW COMPARISON:  Previous studies including the  CT done on 05/26/2021 FINDINGS: Cardiac size is within normal limits. There are no signs of pulmonary edema. Linear densities are seen in left lower lung fields suggesting scarring or subsegmental atelectasis. There is blunting of both lateral CP angles. There is no pneumothorax. Pacemaker battery is seen in the left infraclavicular region. IMPRESSION: There are no signs of pulmonary edema. Linear densities in left lower lung field may suggest scarring or subsegmental atelectasis. Blunting of both lateral CP angles may suggest small bilateral pleural effusions or pleural thickening. Electronically Signed   By: Ernie Avena M.D.   On: 04/27/2023 16:23      Subjective: Patient seen and examined at bedside.  With some slightly, hardly answers any questions.  Confused.  No fever, vomiting or seizures reported.  Discharge Exam: Vitals:   04/30/23 2106 05/01/23 0505  BP: (!) 130/48 (!) 114/39  Pulse: 80 68  Resp: 14 14  Temp: 99.3 F (37.4 C) 98.4 F (36.9 C)  SpO2: 95% 95%    General: Chronically ill and deconditioned looking.  Elderly female lying in bed.  Wakes up only very slightly, hardly answers any questions.  Confused.  Poor historian. Cardiovascular: rate controlled, S1/S2 + Respiratory: bilateral decreased breath sounds at bases with some scattered crackles Abdominal: Soft, NT, ND, bowel sounds + Extremities: Trace lower extremity edema present; no cyanosis    The results of significant diagnostics from this hospitalization (including imaging, microbiology, ancillary  and laboratory) are listed below for reference.     Microbiology: Recent Results (from the past 240 hour(s))  Blood culture (routine x 2)     Status: None (Preliminary result)   Collection Time: 04/27/23 11:12 PM   Specimen: BLOOD  Result Value Ref Range Status   Specimen Description   Final    BLOOD BLOOD RIGHT ARM Performed at [facility], [address] [place], [place] [address]    Special Requests   Final    BOTTLES DRAWN AEROBIC AND ANAEROBIC Blood Culture adequate volume Performed at [facility], [address] [place], [place] [address]    Culture   Final    NO GROWTH 2 DAYS Performed at [facility], [address], [place], [place] [address]    Report Status PENDING  Incomplete  Blood culture (routine x 2)     Status: None (Preliminary result)   Collection Time: 04/27/23 11:18 PM   Specimen: BLOOD  Result Value Ref Range Status   Specimen Description   Final    BLOOD BLOOD RIGHT ARM Performed at [facility], [address], [place], [place] [address]    Special Requests   Final    BOTTLES DRAWN AEROBIC AND ANAEROBIC Blood Culture adequate volume Performed at [facility], [address] [place], [place] [address]    Culture   Final    NO GROWTH 2 DAYS Performed at [facility], [address], [place], [place] [address]    Report Status PENDING  Incomplete  Urine Culture (for pregnant, neutropenic or urologic patients or patients with an indwelling urinary catheter)     Status: Abnormal   Collection Time: 04/29/23 10:41 AM   Specimen: Urine, Clean Catch  Result Value Ref Range Status   Specimen Description   Final    URINE, CLEAN CATCH Performed at [facility], [address], [place], [place] [address]    Special Requests   Final    NONE Performed at [facility], [address] [place], [place] [address]    Culture (A)   Final    <10,000 COLONIES/mL INSIGNIFICANT GROWTH Performed at [facility], [address], [place], [place] [address]    Report Status 04/30/2023 FINAL  Final     Labs: BNP (last 3 results) No results for input(s): "BNP" in the last 8760 hours. Basic Metabolic Panel: Recent Labs  Lab 04/27/23 1602 04/28/23 0711 04/29/23 0546 04/30/23 0543  NA 142 138 140 138  K 4.2 3.7 3.7 3.8  CL 107 106 109 108  CO2 27 26 27 25   GLUCOSE 110* 75 88 81  BUN 17 14 14 14   CREATININE 0.88 0.70 0.89 0.85  CALCIUM 8.3* 7.8* 7.9* 7.8*  MG  --   --  1.9 1.7   Liver Function Tests: Recent Labs  Lab 04/27/23 1602  AST 17  ALT 5  ALKPHOS 89  BILITOT 0.3  PROT 6.7  ALBUMIN 3.2*   Recent Labs  Lab 04/27/23 1602  LIPASE 20   No results for input(s): "AMMONIA" in the last 168 hours. CBC: Recent Labs  Lab 04/27/23 1602 04/27/23 1856 04/28/23 0142 04/28/23 0711 04/29/23 0546 04/30/23 0543  WBC 8.3  --  7.0 5.6 5.6 5.4  NEUTROABS 5.5  --   --   --  3.1 3.2  HGB 9.8* 9.3* 8.8* 9.0* 9.4* 9.6*  HCT 31.9* 30.1* 29.0* 29.6* 30.1* 31.6*  MCV 93.8  --  96.0 94.9 94.4 96.0  PLT 278  --  275 262 261 249   Cardiac Enzymes: No results for input(s): "CKTOTAL", "CKMB", "CKMBINDEX", "TROPONINI" in the last 168 hours. BNP: Invalid input(s): "POCBNP" CBG: No results for input(s): "GLUCAP" in the last 168 hours. D-Dimer No results for input(s): "DDIMER" in the last 72 hours. Hgb A1c No results for input(s): "HGBA1C" in the last 72 hours. Lipid Profile No results for input(s): "CHOL", "HDL", "LDLCALC", "TRIG", "CHOLHDL", "LDLDIRECT" in the last 72 hours. Thyroid function studies No results for input(s): "TSH", "T4TOTAL", "T3FREE", "THYROIDAB" in the last 72 hours.  Invalid input(s): "FREET3" Anemia work up No results for input(s): "VITAMINB12", "FOLATE", "FERRITIN", "TIBC", "IRON", "RETICCTPCT" in the last 72 hours. Urinalysis    Component Value Date/Time   COLORURINE YELLOW  04/27/2023 1752   APPEARANCEUR CLEAR 04/27/2023 1752   LABSPEC >1.046 (H) 04/27/2023 1752   PHURINE 7.0 04/27/2023 1752   GLUCOSEU NEGATIVE 04/27/2023 1752   HGBUR NEGATIVE 04/27/2023 1752   BILIRUBINUR NEGATIVE 04/27/2023 1752   BILIRUBINUR negative 04/20/2021 0954   KETONESUR NEGATIVE 04/27/2023 1752   PROTEINUR TRACE (A) 04/27/2023 1752   UROBILINOGEN 0.2 04/20/2021 0954   UROBILINOGEN 1.0 10/19/2014 1756   NITRITE POSITIVE (A) 04/27/2023 1752   LEUKOCYTESUR SMALL (A) 04/27/2023 1752   Sepsis Labs Recent Labs  Lab 04/28/23 0142 04/28/23 0711 04/29/23 0546 04/30/23 0543  WBC 7.0 5.6 5.6 5.4   Microbiology Recent Results (from the past 240 hour(s))  Blood culture (routine x 2)     Status: None (Preliminary result)   Collection Time: 04/27/23 11:12 PM   Specimen: BLOOD  Result Value Ref Range Status   Specimen Description   Final    BLOOD BLOOD RIGHT ARM Performed at [facility], [address] [place], [place] [address]    Special Requests   Final    BOTTLES DRAWN AEROBIC AND ANAEROBIC Blood Culture adequate volume Performed at [facility], [address] [place], [place] [address]  Culture   Final    NO GROWTH 2 DAYS Performed at [facility], [address] [place], [place] [address]    Report Status PENDING  Incomplete  Blood culture (routine x 2)     Status: None (Preliminary result)   Collection Time: 04/27/23 11:18 PM   Specimen: BLOOD  Result Value Ref Range Status   Specimen Description   Final    BLOOD BLOOD RIGHT ARM Performed at [facility], [address] [place], [place] [address]    Special Requests   Final    BOTTLES DRAWN AEROBIC AND ANAEROBIC Blood Culture adequate volume Performed at [facility], [address], [place], [place] [address]    Culture   Final    NO GROWTH 2 DAYS Performed at [facility], [address], [place], [place]  [address]    Report Status PENDING  Incomplete  Urine Culture (for pregnant, neutropenic or urologic patients or patients with an indwelling urinary catheter)     Status: Abnormal   Collection Time: 04/29/23 10:41 AM   Specimen: Urine, Clean Catch  Result Value Ref Range Status   Specimen Description   Final    URINE, CLEAN CATCH Performed at [facility], [address], [place], [place] [address]    Special Requests   Final    NONE Performed at [facility], [address], [place], [place] [address]    Culture (A)  Final    <10,000 COLONIES/mL INSIGNIFICANT GROWTH Performed at [facility], [address] [place], [place] [address]    Report Status 04/30/2023 FINAL  Final     Time coordinating discharge: 35 minutes  SIGNED:   Glade Lloyd, MD  [facility] 05/01/2023, 9:39 AM

## 2023-05-01 NOTE — TOC Transition Note (Addendum)
Transition of Care [facility]) - CM/SW Discharge Note   Patient Details  Name: Jill Bowman MRN: [id number] Date of Birth: March 18, 1929  Transition of Care [facility]) CM/SW Contact:  Beckie Busing, RN Phone Number:[phone]  05/01/2023, 9:44 AM   Clinical Narrative:    Patient to discharge home. CM has received verification from [facility] that  DME has been delivered to the home .   1030 CM attempted to contact daughter to set up transportation home. Daughter Sedalia Muta does not answer. Voicemail has been left. Will await return call.   1111 CM spoke with daughter Graciella Belton. Daughter ok with transport. Transportation has been arranged via [facility]. Daughter has been made aware that family will need to pick up personal walker and other belongings . Daughter states that niece will pick up belongings. Discharge packet is at nurses station. Nurse has been updated.   Final next level of care: Home w Hospice Care Barriers to Discharge: No Barriers Identified   Patient Goals and CMS Choice CMS Medicare.gov Compare Post Acute Care list provided to:: Patient Represenative (must comment) (daughter Graciella Belton) Choice offered to / list presented to : Adult Children  Discharge Placement                         Discharge Plan and Services Additional resources added to the After Visit Summary for   In-house Referral: NA Discharge Planning Services: NA Post Acute Care Choice: Hospice          DME Arranged: N/A (Hospice to arrange DME) DME Agency:  (per Hospice referral)       HH Arranged: NA HH Agency: NA        Social Determinants of Health (SDOH) Interventions SDOH Screenings   Food Insecurity: No Food Insecurity (04/29/2023)  Housing: Low Risk  (04/28/2023)  Transportation Needs: No Transportation Needs (04/28/2023)  Utilities: Not At Risk (04/28/2023)  Depression (PHQ2-9): Low Risk  (10/07/2020)  Financial Resource Strain: Low Risk  (10/13/2021)  Physical Activity: Inactive (10/13/2021)  Stress:  Stress Concern Present (10/13/2021)  Tobacco Use: Medium Risk (04/27/2023)     Readmission Risk Interventions    04/30/2023    1:52 PM 04/30/2023   10:49 AM  Readmission Risk Prevention Plan  Transportation Screening Complete Complete  PCP or Specialist Appt within 3-5 Days  Complete  HRI or Home Care Consult  Complete  Social Work Consult for Recovery Care Planning/Counseling  Complete  Palliative Care Screening  Complete  Medication Review [facility])  Referral to Pharmacy

## 2023-05-02 ENCOUNTER — Telehealth (INDEPENDENT_AMBULATORY_CARE_PROVIDER_SITE_OTHER): Payer: Medicare Other | Admitting: Nurse Practitioner

## 2023-05-02 ENCOUNTER — Telehealth: Payer: Self-pay | Admitting: Cardiology

## 2023-05-02 ENCOUNTER — Telehealth: Payer: Self-pay

## 2023-05-02 ENCOUNTER — Ambulatory Visit: Payer: Self-pay

## 2023-05-02 ENCOUNTER — Encounter: Payer: Self-pay | Admitting: Nurse Practitioner

## 2023-05-02 ENCOUNTER — Ambulatory Visit: Payer: Medicare Other | Admitting: Podiatry

## 2023-05-02 VITALS — BP 116/59 | Temp 97.2°F

## 2023-05-02 DIAGNOSIS — Z09 Encounter for follow-up examination after completed treatment for conditions other than malignant neoplasm: Secondary | ICD-10-CM

## 2023-05-02 DIAGNOSIS — S31000D Unspecified open wound of lower back and pelvis without penetration into retroperitoneum, subsequent encounter: Secondary | ICD-10-CM | POA: Diagnosis not present

## 2023-05-02 DIAGNOSIS — R7309 Other abnormal glucose: Secondary | ICD-10-CM | POA: Diagnosis not present

## 2023-05-02 DIAGNOSIS — G301 Alzheimer's disease with late onset: Secondary | ICD-10-CM

## 2023-05-02 DIAGNOSIS — I10 Essential (primary) hypertension: Secondary | ICD-10-CM

## 2023-05-02 DIAGNOSIS — F0280 Dementia in other diseases classified elsewhere without behavioral disturbance: Secondary | ICD-10-CM

## 2023-05-02 LAB — CULTURE, BLOOD (ROUTINE X 2): Culture: NO GROWTH

## 2023-05-02 NOTE — Transitions of Care (Post Inpatient/ED Visit) (Signed)
05/02/2023  Name: Jill Bowman MRN: [id number] DOB: May 27, 1929  Today's TOC FU Call Status: Today's TOC FU Call Status:: Successful TOC FU Call Competed TOC FU Call Complete Date: 05/02/23  Transition Care Management Follow-up Telephone Call Date of Discharge: 05/01/23 Discharge Facility: [facility]) Type of Discharge: Inpatient Admission Reason for ED Visit: Other: How have you been since you were released from the hospital?: Better Any questions or concerns?: No  Items Reviewed: Did you receive and understand the discharge instructions provided?: Yes Medications obtained,verified, and reconciled?: Yes (Medications Reviewed) Any new allergies since your discharge?: No Dietary orders reviewed?: No Do you have support at home?: Yes People in Home: child(ren), adult  Medications Reviewed Today: Medications Reviewed Today     Reviewed by Conni Slipper, CPhT (Pharmacy Technician) on 04/28/23 at 2238  Med List Status: Complete   Medication Order Taking? Sig Documenting Provider Last Dose Status Informant  acetaminophen (TYLENOL) 500 MG tablet [id number] Yes Take 500 mg by mouth every 6 (six) hours as needed for mild pain. [provider] unk Active Child  acidophilus (RISAQUAD) CAPS capsule [id number] Yes Take 1 capsule by mouth daily. Rhetta Mura, MD 04/27/2023 Active Child           Med Note Ripley Fraise, AUDWIN   YQM Apr 28, 2023 10:24 PM) noon  amiodarone (PACERONE) 100 MG tablet [id number] Yes TAKE ONE TABLET BY MOUTH EVERY MORNING Yates Decamp, MD 04/27/2023 Active Child  amLODipine (NORVASC) 5 MG tablet [id number] Yes TAKE ONE TABLET BY MOUTH EVERY DAY Yates Decamp, MD 04/27/2023 Active Child  ASPIRIN 81 PO [id number] No Take 1 tablet by mouth daily. [provider] 04/26/2023 Active Child           Med Note Ripley Fraise, AUDWIN   Sat Apr 28, 2023 10:27 PM) bedtime  busPIRone (BUSPAR) 5 MG tablet [id number] Yes Take 5 mg by mouth 2 (two) times daily as needed.  [provider] 04/27/2023 Active Child  carboxymethylcellulose (REFRESH PLUS) 0.5 % SOLN [id number] Yes Place 1 drop into both eyes 3 (three) times daily as needed (dry eyes). [provider] 04/27/2023 Active Child  Cholecalciferol (VITAMIN D3) 50 MCG (2000 UT) capsule [id number] Yes Take 2,000 Units by mouth daily. [provider] 04/27/2023 Active Child           Med Note Ripley Fraise, AUDWIN   Sat Apr 28, 2023 10:24 PM) noon  docusate sodium (COLACE) 100 MG capsule [id number] Yes Take 200 mg by mouth at bedtime. [provider] 04/26/2023 Active Child  donepezil (ARICEPT) 10 MG tablet [id number] Yes Take 1 tablet (10 mg total) by mouth at bedtime. Please call and make overdue appt for further refills, 1 st attempt Butch Penny, NP 04/26/2023 Active Child  famotidine (PEPCID) 20 MG tablet [id number] Yes Take 20 mg by mouth daily. [provider] 04/27/2023 Active Child  gabapentin (NEURONTIN) 300 MG capsule [id number] Yes TAKE ONE CAPSULE BY MOUTH TWICE DAILY Arnette Felts, FNP 04/27/2023 Active Child           Med Note Ripley Fraise, AUDWIN   Sat Apr 28, 2023 10:25 PM) Noon,pm  loratadine (CLARITIN) 10 MG tablet [id number] Yes Take 10 mg by mouth daily as needed for allergies.  [provider] 04/27/2023 Active Child  metoprolol succinate (TOPROL-XL) 25 MG 24 hr tablet [id number] Yes Take 1 tablet (25 mg total) by mouth daily. Take with or immediately following a meal. Yates Decamp, MD 04/27/2023 1000 Active Child  nitroGLYCERIN (NITROSTAT) 0.4 MG SL tablet [id number]  Yes dissolve ONE UNDER THE TONGUE EVERY FIVE minutes FOR not more THAN THREE doses Yates Decamp, MD unk Active Child  pantoprazole (PROTONIX) 40 MG tablet [id number] Yes Take 40 mg by mouth daily. [provider] 04/27/2023 Active Child  pravastatin (PRAVACHOL) 80 MG tablet [id number] Yes Take 1 tablet (80 mg total) by mouth daily.  Patient taking differently: Take 80 mg by mouth at bedtime.   Yates Decamp, MD 04/26/2023 Active Child  rivaroxaban (XARELTO) 20 MG TABS tablet [id number] Yes TAKE 1 TABLET BY MOUTH ONCE DAILY WITH SUPPER Yates Decamp, MD 04/26/2023 1700 Active Child            Home Care and Equipment/Supplies: Were Home Health Services Ordered?: No Has Agency set up a time to come to your home?: No EMR reviewed for Home Health Orders: Home Health Not Ordered Any new equipment or medical supplies ordered?: No  Functional Questionnaire: Do you need assistance with bathing/showering or dressing?: Yes Do you need assistance with meal preparation?: Yes Do you need assistance with eating?: Yes Do you have difficulty maintaining continence: Yes Do you need assistance with getting out of bed/getting out of a chair/moving?: Yes Do you have difficulty managing or taking your medications?: Yes  Follow up appointments reviewed: PCP Follow-up appointment confirmed?: Yes Date of PCP follow-up appointment?: 05/02/23 Follow-up Provider: Arnette Felts [facility] Specialist Hospital Follow-up appointment confirmed?: No Reason Specialist Follow-Up Not Confirmed: Appointment Sceduled by [facility] Calling Clinician Do you need transportation to your follow-up appointment?: No Do you understand care options if your condition(s) worsen?: Yes-patient verbalized understanding    SIGNATUREYL,RMA
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
## 2023-05-02 NOTE — Chronic Care Management (AMB) (Signed)
   05/02/2023  Jill Bowman [birth year] [id number]   Reason for Encounter: Patient is not currently enrolled in the CCM program. CCM status changed to previously enrolled  Alto Denver RN, MSN, CCM RN Care Manager  Chronic Care Management Direct Number: [phone]

## 2023-05-03 NOTE — Telephone Encounter (Signed)
Patient's mother is in hospice, she will not be coming to the office any further.  However patient's daughter would like me to continue to monitor her pacemaker and any cardiac issues that might arise as hospice will not deal with cardiology problems.  I have accepted the responsibility.  I will have her pacemaker transferred over to our service for remote monitoring.  No need for office visits going forward.

## 2023-05-09 ENCOUNTER — Ambulatory Visit: Payer: Medicare Other | Admitting: Nurse Practitioner

## 2023-05-16 ENCOUNTER — Encounter: Payer: Self-pay | Admitting: Cardiology

## 2023-05-19 ENCOUNTER — Other Ambulatory Visit: Payer: Self-pay | Admitting: Cardiology

## 2023-05-19 DIAGNOSIS — I4892 Unspecified atrial flutter: Secondary | ICD-10-CM

## 2023-05-30 ENCOUNTER — Ambulatory Visit: Payer: Medicare Other | Admitting: Nurse Practitioner

## 2023-06-12 ENCOUNTER — Other Ambulatory Visit: Payer: Self-pay | Admitting: Nurse Practitioner

## 2023-06-12 ENCOUNTER — Other Ambulatory Visit: Payer: Self-pay | Admitting: Cardiology

## 2023-06-12 DIAGNOSIS — E7800 Pure hypercholesterolemia, unspecified: Secondary | ICD-10-CM

## 2023-06-12 DIAGNOSIS — H04129 Dry eye syndrome of unspecified lacrimal gland: Secondary | ICD-10-CM

## 2023-06-12 MED ORDER — CYCLOSPORINE 0.05 % OP EMUL
1.0000 [drp] | Freq: Two times a day (BID) | OPHTHALMIC | 3 refills | Status: DC
Start: 2023-06-12 — End: 2023-10-12

## 2023-06-15 ENCOUNTER — Other Ambulatory Visit: Payer: Self-pay | Admitting: Adult Health

## 2023-08-21 ENCOUNTER — Other Ambulatory Visit: Payer: Self-pay | Admitting: Cardiology

## 2023-08-21 DIAGNOSIS — I4892 Unspecified atrial flutter: Secondary | ICD-10-CM

## 2023-08-21 NOTE — Telephone Encounter (Signed)
Prescription refill request for Xarelto received.  Indication: a fib Last office visit: 09/11/22 Weight: 184 Age:89 Scr: 0.85 04/30/23 epic CrCl: 54 ml.min

## 2023-08-29 ENCOUNTER — Encounter: Payer: Self-pay | Admitting: Cardiology

## 2023-09-12 ENCOUNTER — Ambulatory Visit: Payer: Medicare Other | Admitting: Cardiology

## 2023-09-27 ENCOUNTER — Other Ambulatory Visit: Payer: Self-pay | Admitting: Cardiology

## 2023-10-05 ENCOUNTER — Encounter: Payer: Self-pay | Admitting: Nurse Practitioner

## 2023-10-08 ENCOUNTER — Telehealth: Payer: Self-pay | Admitting: Nurse Practitioner

## 2023-10-08 NOTE — Telephone Encounter (Signed)
Received message from her daughter the patient passed away on 08-Nov-2023 in her sleep, she was under the care of hospice. I expressed my condolences.

## 2023-10-10 ENCOUNTER — Telehealth: Payer: Self-pay | Admitting: Cardiology

## 2023-10-10 NOTE — Telephone Encounter (Signed)
Patient has passed away.

## 2023-10-10 NOTE — Telephone Encounter (Signed)
Pt's daughter called stating that pt passed away on 2023/10/23 and would like to know what to do with device. Please advise

## 2023-10-12 NOTE — Telephone Encounter (Signed)
LMOVM for pt daughter to call [facility] tech support to order return kit.

## 2023-10-28 DEATH — deceased
